# Patient Record
Sex: Male | Born: 1935
Health system: Southern US, Community
[De-identification: ages and names within clinical notes are randomized; demographics above are authoritative.]

## PROBLEM LIST (undated history)

## (undated) DIAGNOSIS — M4644 Discitis, unspecified, thoracic region: Secondary | ICD-10-CM

## (undated) DIAGNOSIS — M069 Rheumatoid arthritis, unspecified: Secondary | ICD-10-CM

## (undated) DIAGNOSIS — I350 Nonrheumatic aortic (valve) stenosis: Secondary | ICD-10-CM

## (undated) DIAGNOSIS — C439 Malignant melanoma of skin, unspecified: Secondary | ICD-10-CM

## (undated) DIAGNOSIS — I10 Essential (primary) hypertension: Secondary | ICD-10-CM

## (undated) HISTORY — PX: COLONOSCOPY: SHX174

## (undated) HISTORY — DX: Nonrheumatic aortic (valve) stenosis: I35.0

## (undated) HISTORY — PX: TONSILLECTOMY: SUR1361

## (undated) HISTORY — PX: OTHER SURGICAL HISTORY: SHX169

## (undated) HISTORY — PX: CATARACT EXTRACTION, BILATERAL: SHX1313

---

## 1998-03-21 ENCOUNTER — Other Ambulatory Visit: Admission: RE | Admit: 1998-03-21 | Discharge: 1998-03-21 | Payer: Self-pay | Admitting: Urology

## 1998-03-24 ENCOUNTER — Ambulatory Visit (HOSPITAL_COMMUNITY): Admission: RE | Admit: 1998-03-24 | Discharge: 1998-03-24 | Payer: Self-pay | Admitting: Geriatric Medicine

## 1999-11-27 ENCOUNTER — Ambulatory Visit (HOSPITAL_COMMUNITY): Admission: RE | Admit: 1999-11-27 | Discharge: 1999-11-28 | Payer: Self-pay | Admitting: Ophthalmology

## 2002-09-15 ENCOUNTER — Ambulatory Visit (HOSPITAL_COMMUNITY): Admission: RE | Admit: 2002-09-15 | Discharge: 2002-09-15 | Payer: Self-pay | Admitting: Gastroenterology

## 2005-08-06 ENCOUNTER — Encounter: Admission: RE | Admit: 2005-08-06 | Discharge: 2005-08-06 | Payer: Self-pay | Admitting: Geriatric Medicine

## 2011-04-24 ENCOUNTER — Emergency Department (INDEPENDENT_AMBULATORY_CARE_PROVIDER_SITE_OTHER): Payer: Medicare Other

## 2011-04-24 ENCOUNTER — Emergency Department (HOSPITAL_BASED_OUTPATIENT_CLINIC_OR_DEPARTMENT_OTHER)
Admission: EM | Admit: 2011-04-24 | Discharge: 2011-04-24 | Disposition: A | Discharge status: Home / Self Care | Payer: Medicare Other | Attending: Emergency Medicine | Admitting: Emergency Medicine

## 2011-04-24 DIAGNOSIS — R509 Fever, unspecified: Secondary | ICD-10-CM

## 2011-04-24 DIAGNOSIS — J159 Unspecified bacterial pneumonia: Secondary | ICD-10-CM | POA: Insufficient documentation

## 2011-04-24 DIAGNOSIS — Z8739 Personal history of other diseases of the musculoskeletal system and connective tissue: Secondary | ICD-10-CM | POA: Insufficient documentation

## 2011-04-24 DIAGNOSIS — Z79899 Other long term (current) drug therapy: Secondary | ICD-10-CM | POA: Insufficient documentation

## 2011-04-24 DIAGNOSIS — R05 Cough: Secondary | ICD-10-CM

## 2011-04-24 DIAGNOSIS — I10 Essential (primary) hypertension: Secondary | ICD-10-CM | POA: Insufficient documentation

## 2011-04-24 HISTORY — DX: Essential (primary) hypertension: I10

## 2011-04-24 HISTORY — DX: Malignant melanoma of skin, unspecified: C43.9

## 2011-04-24 HISTORY — DX: Rheumatoid arthritis, unspecified: M06.9

## 2011-04-24 MED ORDER — MOXIFLOXACIN HCL 400 MG PO TABS
400.0000 mg | ORAL_TABLET | Freq: Once | ORAL | Status: AC
  Administered 2011-04-24: 400 mg via ORAL
  Filled 2011-04-24: qty 1

## 2011-04-24 MED ORDER — ACETAMINOPHEN 325 MG PO TABS
650.0000 mg | ORAL_TABLET | Freq: Once | ORAL | Status: AC
  Administered 2011-04-24: 650 mg via ORAL
  Filled 2011-04-24: qty 2

## 2011-04-24 MED ORDER — MOXIFLOXACIN HCL 400 MG PO TABS: 400.0000 mg | ORAL_TABLET | Freq: Every day | ORAL | Status: AC

## 2011-04-24 NOTE — ED Provider Notes (Signed)
History     CSN: [id number] Arrival date & time: 04/24/2011  6:19 PM   First MD Initiated Contact with Patient 04/24/11 1904      Chief Complaint  Patient presents with  . URI    (Consider location/radiation/quality/duration/timing/severity/associated sxs/prior treatment) Patient is a 77 y.o. male presenting with cough.  Cough This is a new problem. The current episode started yesterday. The problem occurs constantly. The problem has not changed since onset.The cough is productive of sputum. The maximum temperature recorded prior to his arrival was 100 to 100.9 F. The fever has been present for less than 1 day. Associated symptoms include chills, sore throat and myalgias. Pertinent negatives include no chest pain, no sweats, no weight loss, no ear congestion, no ear pain, no headaches, no shortness of breath and no wheezing. Treatments tried: Throat spray. The treatment provided mild relief.   Patient with RA receive scheduled Remicade last week and has multiple sick contacts at home diagnosed with bronchitis. Patient started feeling poorly yesterday with cough and chest congestion, some sore throat and fever today. He denies any difficulty breathing, any rash, any abdominal pain or vomiting. He is followed by Dr. Cardell Peach for his rheumatoid and by Dr. Pete Glatter in [place]. Symptoms moderate in severity. No aggravating or alleviating factors.   Past Medical History  Diagnosis Date  . Rheumatoid arthritis   . Hypertension   . Melanoma     Past Surgical History  Procedure Date  . Tonsillectomy   . Hole in retina surgery     No family history on file.  History  Substance Use Topics  . Smoking status: Former Games developer  . Smokeless tobacco: Not on file  . Alcohol Use: Yes      Review of Systems  Constitutional: Positive for chills. Negative for fever and weight loss.  HENT: Positive for sore throat. Negative for ear pain, neck pain and neck stiffness.   Eyes: Negative for pain.   Respiratory: Positive for cough. Negative for shortness of breath and wheezing.   Cardiovascular: Negative for chest pain.  Gastrointestinal: Negative for abdominal pain.  Genitourinary: Negative for dysuria.  Musculoskeletal: Positive for myalgias. Negative for back pain.  Skin: Negative for rash.  Neurological: Negative for headaches.  All other systems reviewed and are negative.    Allergies  Other and Penicillins cross reactors  Home Medications   Current Outpatient Rx  Name Route Sig Dispense Refill  . AMLODIPINE BESYLATE 2.5 MG PO TABS Oral Take 2.5 mg by mouth daily.      Marland Kitchen CALCIUM CARBONATE-VITAMIN D 600-400 MG-UNIT PO TABS Oral Take 1 tablet by mouth daily.      . CYANOCOBALAMIN 500 MCG PO TABS Oral Take 500 mcg by mouth daily.      . INFLIXIMAB 100 MG IV SOLR Intravenous Inject into the vein every 8 (eight) weeks.      Marland Kitchen LISINOPRIL 10 MG PO TABS Oral Take 10 mg by mouth daily.      Marland Kitchen MELATONIN 3 MG PO TABS Oral Take 1 tablet by mouth at bedtime.      . METHOTREXATE (ANTI-RHEUMATIC) 2.5 MG PO TABS Oral Take 20 mg by mouth every 7 (seven) days. Take on Monday     . METOPROLOL SUCCINATE 50 MG PO TB24 Oral Take 50 mg by mouth daily.      Marland Kitchen ONE-DAILY MULTI VITAMINS PO TABS Oral Take 1 tablet by mouth daily.        BP 168/89  Pulse 78  Temp(Src) 98.2 F (36.8 C) (Oral)  Resp 20  Ht 5\' 6"  (1.676 m)  Wt 178 lb (80.74 kg)  BMI 28.73 kg/m2  SpO2 99%  Physical Exam  Constitutional: He is oriented to person, place, and time. He appears well-developed and well-nourished.  HENT:  Head: Normocephalic and atraumatic.  Eyes: Conjunctivae and EOM are normal. Pupils are equal, round, and reactive to light.  Neck: Trachea normal. Neck supple. No thyromegaly present.  Cardiovascular: Normal rate, regular rhythm, S1 normal, S2 normal and normal pulses.     No systolic murmur is present   No diastolic murmur is present  Pulses:      Radial pulses are 2+ on the right side, and  2+ on the left side.  Pulmonary/Chest: Effort normal and breath sounds normal. He has no wheezes. He has no rhonchi. He has no rales. He exhibits no tenderness.  Abdominal: Soft. Normal appearance and bowel sounds are normal. There is no tenderness. There is no CVA tenderness and negative Murphy's sign.  Musculoskeletal:       BLE:s Calves nontender, no cords or erythema, negative Homans sign  Neurological: He is alert and oriented to person, place, and time. He has normal strength. No cranial nerve deficit or sensory deficit. GCS eye subscore is 4. GCS verbal subscore is 5. GCS motor subscore is 6.  Skin: Skin is warm and dry. No rash noted. He is not diaphoretic.  Psychiatric: His speech is normal.       Cooperative and appropriate    ED Course  Procedures (including critical care time)  Labs Reviewed - No data to display Dg Chest 2 View  04/24/2011  *RADIOLOGY REPORT*  Clinical Data: 77 year old male with productive cough and fever.  CHEST - 2 VIEW  Comparison: None.  Findings: Lung volumes are within normal limits.  Cardiac size at the upper limits of normal. Other mediastinal contours are within normal limits.  Visualized tracheal air column is within normal limits.  No pneumothorax, pulmonary edema or pleural effusion.  No consolidation, but there is subtle asymmetric right lower lobe peribronchovascular opacity. Flowing osteophytes in the thoracic spine. No acute osseous abnormality identified.  IMPRESSION: Asymmetric right lower lobe peribronchovascular opacity suspicious for developing bronchopneumonia/pneumonia in this setting.  Original Report Authenticated By: Harley Hallmark, M.D.   Pulse ox 99% room air: Adequate  Diagnosis: Bronchopneumonia   MDM   Elderly male with cough, subjective fever and chest x-ray obtained and reviewed as above consistent with community-acquired pneumonia. Lung sounds are clear with no respiratory distress or wheezing. No tachycardia or hypoxia and  afebrile at this time. Patient is a reliable historian and appropriate for discharge home with outpatient treatment. He agrees to be evaluated immediately for any difficulty breathing or worsening condition. Plan recheck with primary care physician 2 days in clinic.        Sunnie Nielsen, MD 04/24/11 [phone]

## 2011-04-24 NOTE — ED Notes (Signed)
Prod cough, fever, chills x 4 days

## 2011-06-20 DIAGNOSIS — M069 Rheumatoid arthritis, unspecified: Secondary | ICD-10-CM | POA: Diagnosis not present

## 2011-06-28 DIAGNOSIS — J019 Acute sinusitis, unspecified: Secondary | ICD-10-CM | POA: Diagnosis not present

## 2011-08-14 DIAGNOSIS — J019 Acute sinusitis, unspecified: Secondary | ICD-10-CM | POA: Diagnosis not present

## 2011-08-14 DIAGNOSIS — J310 Chronic rhinitis: Secondary | ICD-10-CM | POA: Diagnosis not present

## 2011-08-15 DIAGNOSIS — M069 Rheumatoid arthritis, unspecified: Secondary | ICD-10-CM | POA: Diagnosis not present

## 2011-08-15 DIAGNOSIS — J019 Acute sinusitis, unspecified: Secondary | ICD-10-CM | POA: Diagnosis not present

## 2011-09-12 DIAGNOSIS — H35379 Puckering of macula, unspecified eye: Secondary | ICD-10-CM | POA: Diagnosis not present

## 2011-09-12 DIAGNOSIS — H52209 Unspecified astigmatism, unspecified eye: Secondary | ICD-10-CM | POA: Diagnosis not present

## 2011-09-12 DIAGNOSIS — Z961 Presence of intraocular lens: Secondary | ICD-10-CM | POA: Diagnosis not present

## 2011-09-12 DIAGNOSIS — H01009 Unspecified blepharitis unspecified eye, unspecified eyelid: Secondary | ICD-10-CM | POA: Diagnosis not present

## 2011-10-03 DIAGNOSIS — I10 Essential (primary) hypertension: Secondary | ICD-10-CM | POA: Diagnosis not present

## 2011-10-03 DIAGNOSIS — N401 Enlarged prostate with lower urinary tract symptoms: Secondary | ICD-10-CM | POA: Diagnosis not present

## 2011-10-03 DIAGNOSIS — E7800 Pure hypercholesterolemia, unspecified: Secondary | ICD-10-CM | POA: Diagnosis not present

## 2011-10-03 DIAGNOSIS — N529 Male erectile dysfunction, unspecified: Secondary | ICD-10-CM | POA: Diagnosis not present

## 2011-10-03 DIAGNOSIS — Z1331 Encounter for screening for depression: Secondary | ICD-10-CM | POA: Diagnosis not present

## 2011-10-03 DIAGNOSIS — Z0000 Encounter for general adult medical examination without abnormal findings: Secondary | ICD-10-CM | POA: Diagnosis not present

## 2011-10-03 DIAGNOSIS — Z79899 Other long term (current) drug therapy: Secondary | ICD-10-CM | POA: Diagnosis not present

## 2011-10-03 DIAGNOSIS — R972 Elevated prostate specific antigen [PSA]: Secondary | ICD-10-CM | POA: Diagnosis not present

## 2011-10-10 DIAGNOSIS — M069 Rheumatoid arthritis, unspecified: Secondary | ICD-10-CM | POA: Diagnosis not present

## 2011-10-25 DIAGNOSIS — Z85820 Personal history of malignant melanoma of skin: Secondary | ICD-10-CM | POA: Diagnosis not present

## 2011-10-25 DIAGNOSIS — L570 Actinic keratosis: Secondary | ICD-10-CM | POA: Diagnosis not present

## 2011-10-25 DIAGNOSIS — D239 Other benign neoplasm of skin, unspecified: Secondary | ICD-10-CM | POA: Diagnosis not present

## 2011-12-11 DIAGNOSIS — M069 Rheumatoid arthritis, unspecified: Secondary | ICD-10-CM | POA: Diagnosis not present

## 2012-02-07 DIAGNOSIS — M545 Low back pain: Secondary | ICD-10-CM | POA: Diagnosis not present

## 2012-02-07 DIAGNOSIS — M069 Rheumatoid arthritis, unspecified: Secondary | ICD-10-CM | POA: Diagnosis not present

## 2012-02-13 DIAGNOSIS — H35349 Macular cyst, hole, or pseudohole, unspecified eye: Secondary | ICD-10-CM | POA: Diagnosis not present

## 2012-02-13 DIAGNOSIS — H35379 Puckering of macula, unspecified eye: Secondary | ICD-10-CM | POA: Diagnosis not present

## 2012-02-13 DIAGNOSIS — H01009 Unspecified blepharitis unspecified eye, unspecified eyelid: Secondary | ICD-10-CM | POA: Diagnosis not present

## 2012-02-14 DIAGNOSIS — N401 Enlarged prostate with lower urinary tract symptoms: Secondary | ICD-10-CM | POA: Diagnosis not present

## 2012-02-14 DIAGNOSIS — N281 Cyst of kidney, acquired: Secondary | ICD-10-CM | POA: Diagnosis not present

## 2012-02-14 DIAGNOSIS — M545 Low back pain: Secondary | ICD-10-CM | POA: Diagnosis not present

## 2012-04-02 DIAGNOSIS — M069 Rheumatoid arthritis, unspecified: Secondary | ICD-10-CM | POA: Diagnosis not present

## 2012-04-09 DIAGNOSIS — I10 Essential (primary) hypertension: Secondary | ICD-10-CM | POA: Diagnosis not present

## 2012-04-09 DIAGNOSIS — R0602 Shortness of breath: Secondary | ICD-10-CM | POA: Diagnosis not present

## 2012-05-14 DIAGNOSIS — L719 Rosacea, unspecified: Secondary | ICD-10-CM | POA: Diagnosis not present

## 2012-05-14 DIAGNOSIS — Z85820 Personal history of malignant melanoma of skin: Secondary | ICD-10-CM | POA: Diagnosis not present

## 2012-05-14 DIAGNOSIS — D239 Other benign neoplasm of skin, unspecified: Secondary | ICD-10-CM | POA: Diagnosis not present

## 2012-05-14 DIAGNOSIS — L570 Actinic keratosis: Secondary | ICD-10-CM | POA: Diagnosis not present

## 2012-05-20 DIAGNOSIS — L570 Actinic keratosis: Secondary | ICD-10-CM | POA: Diagnosis not present

## 2012-05-29 DIAGNOSIS — M069 Rheumatoid arthritis, unspecified: Secondary | ICD-10-CM | POA: Diagnosis not present

## 2012-08-03 DIAGNOSIS — M069 Rheumatoid arthritis, unspecified: Secondary | ICD-10-CM | POA: Diagnosis not present

## 2012-09-03 DIAGNOSIS — R040 Epistaxis: Secondary | ICD-10-CM | POA: Diagnosis not present

## 2012-09-17 DIAGNOSIS — H52209 Unspecified astigmatism, unspecified eye: Secondary | ICD-10-CM | POA: Diagnosis not present

## 2012-09-17 DIAGNOSIS — H01009 Unspecified blepharitis unspecified eye, unspecified eyelid: Secondary | ICD-10-CM | POA: Diagnosis not present

## 2012-09-17 DIAGNOSIS — Z961 Presence of intraocular lens: Secondary | ICD-10-CM | POA: Diagnosis not present

## 2012-09-17 DIAGNOSIS — H35349 Macular cyst, hole, or pseudohole, unspecified eye: Secondary | ICD-10-CM | POA: Diagnosis not present

## 2012-10-01 DIAGNOSIS — M069 Rheumatoid arthritis, unspecified: Secondary | ICD-10-CM | POA: Diagnosis not present

## 2012-10-15 DIAGNOSIS — E7800 Pure hypercholesterolemia, unspecified: Secondary | ICD-10-CM | POA: Diagnosis not present

## 2012-10-15 DIAGNOSIS — I10 Essential (primary) hypertension: Secondary | ICD-10-CM | POA: Diagnosis not present

## 2012-10-15 DIAGNOSIS — Z79899 Other long term (current) drug therapy: Secondary | ICD-10-CM | POA: Diagnosis not present

## 2012-10-15 DIAGNOSIS — Z0000 Encounter for general adult medical examination without abnormal findings: Secondary | ICD-10-CM | POA: Diagnosis not present

## 2012-10-15 DIAGNOSIS — Z1331 Encounter for screening for depression: Secondary | ICD-10-CM | POA: Diagnosis not present

## 2012-11-19 DIAGNOSIS — L570 Actinic keratosis: Secondary | ICD-10-CM | POA: Diagnosis not present

## 2012-11-19 DIAGNOSIS — Z872 Personal history of diseases of the skin and subcutaneous tissue: Secondary | ICD-10-CM | POA: Diagnosis not present

## 2012-11-19 DIAGNOSIS — Z85820 Personal history of malignant melanoma of skin: Secondary | ICD-10-CM | POA: Diagnosis not present

## 2012-11-19 DIAGNOSIS — D239 Other benign neoplasm of skin, unspecified: Secondary | ICD-10-CM | POA: Diagnosis not present

## 2012-11-26 DIAGNOSIS — M069 Rheumatoid arthritis, unspecified: Secondary | ICD-10-CM | POA: Diagnosis not present

## 2013-02-04 DIAGNOSIS — M069 Rheumatoid arthritis, unspecified: Secondary | ICD-10-CM | POA: Diagnosis not present

## 2013-02-04 DIAGNOSIS — Z23 Encounter for immunization: Secondary | ICD-10-CM | POA: Diagnosis not present

## 2013-02-11 DIAGNOSIS — R7989 Other specified abnormal findings of blood chemistry: Secondary | ICD-10-CM | POA: Diagnosis not present

## 2013-03-04 DIAGNOSIS — H35379 Puckering of macula, unspecified eye: Secondary | ICD-10-CM | POA: Diagnosis not present

## 2013-03-04 DIAGNOSIS — H35349 Macular cyst, hole, or pseudohole, unspecified eye: Secondary | ICD-10-CM | POA: Diagnosis not present

## 2013-04-01 DIAGNOSIS — M069 Rheumatoid arthritis, unspecified: Secondary | ICD-10-CM | POA: Diagnosis not present

## 2013-04-15 DIAGNOSIS — I10 Essential (primary) hypertension: Secondary | ICD-10-CM | POA: Diagnosis not present

## 2013-04-15 DIAGNOSIS — R42 Dizziness and giddiness: Secondary | ICD-10-CM | POA: Diagnosis not present

## 2013-05-27 DIAGNOSIS — M069 Rheumatoid arthritis, unspecified: Secondary | ICD-10-CM | POA: Diagnosis not present

## 2013-06-24 DIAGNOSIS — Z85820 Personal history of malignant melanoma of skin: Secondary | ICD-10-CM | POA: Diagnosis not present

## 2013-06-24 DIAGNOSIS — Z872 Personal history of diseases of the skin and subcutaneous tissue: Secondary | ICD-10-CM | POA: Diagnosis not present

## 2013-06-24 DIAGNOSIS — D239 Other benign neoplasm of skin, unspecified: Secondary | ICD-10-CM | POA: Diagnosis not present

## 2013-06-24 DIAGNOSIS — L719 Rosacea, unspecified: Secondary | ICD-10-CM | POA: Diagnosis not present

## 2013-06-24 DIAGNOSIS — L570 Actinic keratosis: Secondary | ICD-10-CM | POA: Diagnosis not present

## 2013-07-22 DIAGNOSIS — M069 Rheumatoid arthritis, unspecified: Secondary | ICD-10-CM | POA: Diagnosis not present

## 2013-08-19 DIAGNOSIS — N529 Male erectile dysfunction, unspecified: Secondary | ICD-10-CM | POA: Diagnosis not present

## 2013-08-19 DIAGNOSIS — N401 Enlarged prostate with lower urinary tract symptoms: Secondary | ICD-10-CM | POA: Diagnosis not present

## 2013-08-19 DIAGNOSIS — N138 Other obstructive and reflux uropathy: Secondary | ICD-10-CM | POA: Diagnosis not present

## 2013-09-23 DIAGNOSIS — Z79899 Other long term (current) drug therapy: Secondary | ICD-10-CM | POA: Diagnosis not present

## 2013-09-23 DIAGNOSIS — M069 Rheumatoid arthritis, unspecified: Secondary | ICD-10-CM | POA: Diagnosis not present

## 2013-09-30 DIAGNOSIS — H01009 Unspecified blepharitis unspecified eye, unspecified eyelid: Secondary | ICD-10-CM | POA: Diagnosis not present

## 2013-09-30 DIAGNOSIS — Z961 Presence of intraocular lens: Secondary | ICD-10-CM | POA: Diagnosis not present

## 2013-09-30 DIAGNOSIS — H35349 Macular cyst, hole, or pseudohole, unspecified eye: Secondary | ICD-10-CM | POA: Diagnosis not present

## 2013-09-30 DIAGNOSIS — H02409 Unspecified ptosis of unspecified eyelid: Secondary | ICD-10-CM | POA: Diagnosis not present

## 2013-10-21 ENCOUNTER — Other Ambulatory Visit: Payer: Self-pay | Admitting: Geriatric Medicine

## 2013-10-21 DIAGNOSIS — Z23 Encounter for immunization: Secondary | ICD-10-CM | POA: Diagnosis not present

## 2013-10-21 DIAGNOSIS — D126 Benign neoplasm of colon, unspecified: Secondary | ICD-10-CM | POA: Diagnosis not present

## 2013-10-21 DIAGNOSIS — Z87891 Personal history of nicotine dependence: Secondary | ICD-10-CM

## 2013-10-21 DIAGNOSIS — E7800 Pure hypercholesterolemia, unspecified: Secondary | ICD-10-CM | POA: Diagnosis not present

## 2013-10-21 DIAGNOSIS — K429 Umbilical hernia without obstruction or gangrene: Secondary | ICD-10-CM | POA: Diagnosis not present

## 2013-10-21 DIAGNOSIS — I10 Essential (primary) hypertension: Secondary | ICD-10-CM | POA: Diagnosis not present

## 2013-10-21 DIAGNOSIS — Z1331 Encounter for screening for depression: Secondary | ICD-10-CM | POA: Diagnosis not present

## 2013-10-21 DIAGNOSIS — Z0000 Encounter for general adult medical examination without abnormal findings: Secondary | ICD-10-CM | POA: Diagnosis not present

## 2013-11-04 ENCOUNTER — Ambulatory Visit
Admission: RE | Admit: 2013-11-04 | Discharge: 2013-11-04 | Disposition: A | Discharge status: Home / Self Care | Payer: BLUE CROSS/BLUE SHIELD | Source: Ambulatory Visit | Attending: Geriatric Medicine | Admitting: Geriatric Medicine

## 2013-11-04 DIAGNOSIS — Z87891 Personal history of nicotine dependence: Secondary | ICD-10-CM

## 2013-11-04 DIAGNOSIS — I700 Atherosclerosis of aorta: Secondary | ICD-10-CM | POA: Diagnosis not present

## 2013-11-04 DIAGNOSIS — Z136 Encounter for screening for cardiovascular disorders: Secondary | ICD-10-CM | POA: Diagnosis not present

## 2013-11-18 DIAGNOSIS — Z79899 Other long term (current) drug therapy: Secondary | ICD-10-CM | POA: Diagnosis not present

## 2013-11-18 DIAGNOSIS — M069 Rheumatoid arthritis, unspecified: Secondary | ICD-10-CM | POA: Diagnosis not present

## 2013-11-26 DIAGNOSIS — R7402 Elevation of levels of lactic acid dehydrogenase (LDH): Secondary | ICD-10-CM | POA: Diagnosis not present

## 2013-12-29 ENCOUNTER — Other Ambulatory Visit: Payer: Self-pay | Admitting: Gastroenterology

## 2013-12-29 DIAGNOSIS — K5730 Diverticulosis of large intestine without perforation or abscess without bleeding: Secondary | ICD-10-CM | POA: Diagnosis not present

## 2013-12-29 DIAGNOSIS — D126 Benign neoplasm of colon, unspecified: Secondary | ICD-10-CM | POA: Diagnosis not present

## 2013-12-29 DIAGNOSIS — Z86010 Personal history of colonic polyps: Secondary | ICD-10-CM | POA: Diagnosis not present

## 2013-12-29 DIAGNOSIS — Z09 Encounter for follow-up examination after completed treatment for conditions other than malignant neoplasm: Secondary | ICD-10-CM | POA: Diagnosis not present

## 2014-02-04 DIAGNOSIS — L570 Actinic keratosis: Secondary | ICD-10-CM | POA: Diagnosis not present

## 2014-02-04 DIAGNOSIS — Z872 Personal history of diseases of the skin and subcutaneous tissue: Secondary | ICD-10-CM | POA: Diagnosis not present

## 2014-02-04 DIAGNOSIS — L719 Rosacea, unspecified: Secondary | ICD-10-CM | POA: Diagnosis not present

## 2014-02-04 DIAGNOSIS — Z85820 Personal history of malignant melanoma of skin: Secondary | ICD-10-CM | POA: Diagnosis not present

## 2014-03-10 DIAGNOSIS — H35373 Puckering of macula, bilateral: Secondary | ICD-10-CM | POA: Diagnosis not present

## 2014-03-10 DIAGNOSIS — H43813 Vitreous degeneration, bilateral: Secondary | ICD-10-CM | POA: Diagnosis not present

## 2014-03-10 DIAGNOSIS — H35343 Macular cyst, hole, or pseudohole, bilateral: Secondary | ICD-10-CM | POA: Diagnosis not present

## 2014-03-24 DIAGNOSIS — M0589 Other rheumatoid arthritis with rheumatoid factor of multiple sites: Secondary | ICD-10-CM | POA: Diagnosis not present

## 2014-03-24 DIAGNOSIS — Z79899 Other long term (current) drug therapy: Secondary | ICD-10-CM | POA: Diagnosis not present

## 2014-04-21 DIAGNOSIS — E780 Pure hypercholesterolemia: Secondary | ICD-10-CM | POA: Diagnosis not present

## 2014-04-21 DIAGNOSIS — I10 Essential (primary) hypertension: Secondary | ICD-10-CM | POA: Diagnosis not present

## 2014-05-19 DIAGNOSIS — Z79899 Other long term (current) drug therapy: Secondary | ICD-10-CM | POA: Diagnosis not present

## 2014-05-19 DIAGNOSIS — M0579 Rheumatoid arthritis with rheumatoid factor of multiple sites without organ or systems involvement: Secondary | ICD-10-CM | POA: Diagnosis not present

## 2014-07-14 DIAGNOSIS — Z79899 Other long term (current) drug therapy: Secondary | ICD-10-CM | POA: Diagnosis not present

## 2014-07-14 DIAGNOSIS — M0579 Rheumatoid arthritis with rheumatoid factor of multiple sites without organ or systems involvement: Secondary | ICD-10-CM | POA: Diagnosis not present

## 2014-08-04 DIAGNOSIS — H903 Sensorineural hearing loss, bilateral: Secondary | ICD-10-CM | POA: Diagnosis not present

## 2014-08-04 DIAGNOSIS — H905 Unspecified sensorineural hearing loss: Secondary | ICD-10-CM | POA: Diagnosis not present

## 2014-08-11 DIAGNOSIS — Z85820 Personal history of malignant melanoma of skin: Secondary | ICD-10-CM | POA: Diagnosis not present

## 2014-08-11 DIAGNOSIS — D229 Melanocytic nevi, unspecified: Secondary | ICD-10-CM | POA: Diagnosis not present

## 2014-08-11 DIAGNOSIS — L719 Rosacea, unspecified: Secondary | ICD-10-CM | POA: Diagnosis not present

## 2014-08-11 DIAGNOSIS — Z872 Personal history of diseases of the skin and subcutaneous tissue: Secondary | ICD-10-CM | POA: Diagnosis not present

## 2014-09-29 DIAGNOSIS — Z79899 Other long term (current) drug therapy: Secondary | ICD-10-CM | POA: Diagnosis not present

## 2014-09-29 DIAGNOSIS — M0579 Rheumatoid arthritis with rheumatoid factor of multiple sites without organ or systems involvement: Secondary | ICD-10-CM | POA: Diagnosis not present

## 2014-10-06 DIAGNOSIS — H01001 Unspecified blepharitis right upper eyelid: Secondary | ICD-10-CM | POA: Diagnosis not present

## 2014-10-06 DIAGNOSIS — H35373 Puckering of macula, bilateral: Secondary | ICD-10-CM | POA: Diagnosis not present

## 2014-10-06 DIAGNOSIS — Z961 Presence of intraocular lens: Secondary | ICD-10-CM | POA: Diagnosis not present

## 2014-10-06 DIAGNOSIS — H02403 Unspecified ptosis of bilateral eyelids: Secondary | ICD-10-CM | POA: Diagnosis not present

## 2014-10-07 DIAGNOSIS — N138 Other obstructive and reflux uropathy: Secondary | ICD-10-CM | POA: Diagnosis not present

## 2014-10-07 DIAGNOSIS — N401 Enlarged prostate with lower urinary tract symptoms: Secondary | ICD-10-CM | POA: Diagnosis not present

## 2014-10-07 DIAGNOSIS — N5201 Erectile dysfunction due to arterial insufficiency: Secondary | ICD-10-CM | POA: Diagnosis not present

## 2014-10-27 DIAGNOSIS — Z0000 Encounter for general adult medical examination without abnormal findings: Secondary | ICD-10-CM | POA: Diagnosis not present

## 2014-10-27 DIAGNOSIS — E780 Pure hypercholesterolemia: Secondary | ICD-10-CM | POA: Diagnosis not present

## 2014-10-27 DIAGNOSIS — Z1389 Encounter for screening for other disorder: Secondary | ICD-10-CM | POA: Diagnosis not present

## 2014-10-27 DIAGNOSIS — I700 Atherosclerosis of aorta: Secondary | ICD-10-CM | POA: Diagnosis not present

## 2014-10-27 DIAGNOSIS — I10 Essential (primary) hypertension: Secondary | ICD-10-CM | POA: Diagnosis not present

## 2014-10-27 DIAGNOSIS — M069 Rheumatoid arthritis, unspecified: Secondary | ICD-10-CM | POA: Diagnosis not present

## 2014-11-24 DIAGNOSIS — M0579 Rheumatoid arthritis with rheumatoid factor of multiple sites without organ or systems involvement: Secondary | ICD-10-CM | POA: Diagnosis not present

## 2014-11-24 DIAGNOSIS — Z79899 Other long term (current) drug therapy: Secondary | ICD-10-CM | POA: Diagnosis not present

## 2015-01-19 DIAGNOSIS — M0579 Rheumatoid arthritis with rheumatoid factor of multiple sites without organ or systems involvement: Secondary | ICD-10-CM | POA: Diagnosis not present

## 2015-01-19 DIAGNOSIS — Z79899 Other long term (current) drug therapy: Secondary | ICD-10-CM | POA: Diagnosis not present

## 2015-03-16 DIAGNOSIS — H35373 Puckering of macula, bilateral: Secondary | ICD-10-CM | POA: Diagnosis not present

## 2015-03-16 DIAGNOSIS — H35343 Macular cyst, hole, or pseudohole, bilateral: Secondary | ICD-10-CM | POA: Diagnosis not present

## 2015-03-16 DIAGNOSIS — H43813 Vitreous degeneration, bilateral: Secondary | ICD-10-CM | POA: Diagnosis not present

## 2015-03-16 DIAGNOSIS — M0579 Rheumatoid arthritis with rheumatoid factor of multiple sites without organ or systems involvement: Secondary | ICD-10-CM | POA: Diagnosis not present

## 2015-03-16 DIAGNOSIS — Z79899 Other long term (current) drug therapy: Secondary | ICD-10-CM | POA: Diagnosis not present

## 2015-03-16 DIAGNOSIS — D649 Anemia, unspecified: Secondary | ICD-10-CM | POA: Diagnosis not present

## 2015-03-30 DIAGNOSIS — M47812 Spondylosis without myelopathy or radiculopathy, cervical region: Secondary | ICD-10-CM | POA: Diagnosis not present

## 2015-03-30 DIAGNOSIS — Z79899 Other long term (current) drug therapy: Secondary | ICD-10-CM | POA: Diagnosis not present

## 2015-03-30 DIAGNOSIS — M069 Rheumatoid arthritis, unspecified: Secondary | ICD-10-CM | POA: Diagnosis not present

## 2015-03-30 DIAGNOSIS — M0579 Rheumatoid arthritis with rheumatoid factor of multiple sites without organ or systems involvement: Secondary | ICD-10-CM | POA: Diagnosis not present

## 2015-03-30 DIAGNOSIS — M5031 Other cervical disc degeneration,  high cervical region: Secondary | ICD-10-CM | POA: Diagnosis not present

## 2015-03-30 DIAGNOSIS — M47811 Spondylosis without myelopathy or radiculopathy, occipito-atlanto-axial region: Secondary | ICD-10-CM | POA: Diagnosis not present

## 2015-03-30 DIAGNOSIS — M542 Cervicalgia: Secondary | ICD-10-CM | POA: Diagnosis not present

## 2015-04-06 DIAGNOSIS — D229 Melanocytic nevi, unspecified: Secondary | ICD-10-CM | POA: Diagnosis not present

## 2015-04-06 DIAGNOSIS — L719 Rosacea, unspecified: Secondary | ICD-10-CM | POA: Diagnosis not present

## 2015-04-06 DIAGNOSIS — Z872 Personal history of diseases of the skin and subcutaneous tissue: Secondary | ICD-10-CM | POA: Diagnosis not present

## 2015-04-06 DIAGNOSIS — Z85820 Personal history of malignant melanoma of skin: Secondary | ICD-10-CM | POA: Diagnosis not present

## 2015-04-06 DIAGNOSIS — L570 Actinic keratosis: Secondary | ICD-10-CM | POA: Diagnosis not present

## 2015-04-17 DIAGNOSIS — H9201 Otalgia, right ear: Secondary | ICD-10-CM | POA: Diagnosis not present

## 2015-04-20 DIAGNOSIS — Z6827 Body mass index (BMI) 27.0-27.9, adult: Secondary | ICD-10-CM | POA: Diagnosis not present

## 2015-04-20 DIAGNOSIS — M47812 Spondylosis without myelopathy or radiculopathy, cervical region: Secondary | ICD-10-CM | POA: Diagnosis not present

## 2015-04-27 DIAGNOSIS — M5023 Other cervical disc displacement, cervicothoracic region: Secondary | ICD-10-CM | POA: Diagnosis not present

## 2015-04-27 DIAGNOSIS — M4802 Spinal stenosis, cervical region: Secondary | ICD-10-CM | POA: Diagnosis not present

## 2015-04-27 DIAGNOSIS — M47812 Spondylosis without myelopathy or radiculopathy, cervical region: Secondary | ICD-10-CM | POA: Diagnosis not present

## 2015-05-11 DIAGNOSIS — Z79899 Other long term (current) drug therapy: Secondary | ICD-10-CM | POA: Diagnosis not present

## 2015-05-11 DIAGNOSIS — M0579 Rheumatoid arthritis with rheumatoid factor of multiple sites without organ or systems involvement: Secondary | ICD-10-CM | POA: Diagnosis not present

## 2015-05-11 DIAGNOSIS — I10 Essential (primary) hypertension: Secondary | ICD-10-CM | POA: Diagnosis not present

## 2015-05-18 DIAGNOSIS — Z79899 Other long term (current) drug therapy: Secondary | ICD-10-CM | POA: Diagnosis not present

## 2015-05-18 DIAGNOSIS — M47812 Spondylosis without myelopathy or radiculopathy, cervical region: Secondary | ICD-10-CM | POA: Diagnosis not present

## 2015-05-18 DIAGNOSIS — M0579 Rheumatoid arthritis with rheumatoid factor of multiple sites without organ or systems involvement: Secondary | ICD-10-CM | POA: Diagnosis not present

## 2015-05-30 DIAGNOSIS — M50323 Other cervical disc degeneration at C6-C7 level: Secondary | ICD-10-CM | POA: Diagnosis not present

## 2015-05-30 DIAGNOSIS — M50321 Other cervical disc degeneration at C4-C5 level: Secondary | ICD-10-CM | POA: Diagnosis not present

## 2015-05-30 DIAGNOSIS — M9901 Segmental and somatic dysfunction of cervical region: Secondary | ICD-10-CM | POA: Diagnosis not present

## 2015-05-30 DIAGNOSIS — M50322 Other cervical disc degeneration at C5-C6 level: Secondary | ICD-10-CM | POA: Diagnosis not present

## 2015-05-30 DIAGNOSIS — M5031 Other cervical disc degeneration,  high cervical region: Secondary | ICD-10-CM | POA: Diagnosis not present

## 2015-05-30 DIAGNOSIS — M9902 Segmental and somatic dysfunction of thoracic region: Secondary | ICD-10-CM | POA: Diagnosis not present

## 2015-05-31 DIAGNOSIS — M5031 Other cervical disc degeneration,  high cervical region: Secondary | ICD-10-CM | POA: Diagnosis not present

## 2015-05-31 DIAGNOSIS — M50321 Other cervical disc degeneration at C4-C5 level: Secondary | ICD-10-CM | POA: Diagnosis not present

## 2015-05-31 DIAGNOSIS — M50323 Other cervical disc degeneration at C6-C7 level: Secondary | ICD-10-CM | POA: Diagnosis not present

## 2015-05-31 DIAGNOSIS — M50322 Other cervical disc degeneration at C5-C6 level: Secondary | ICD-10-CM | POA: Diagnosis not present

## 2015-05-31 DIAGNOSIS — M9902 Segmental and somatic dysfunction of thoracic region: Secondary | ICD-10-CM | POA: Diagnosis not present

## 2015-05-31 DIAGNOSIS — M9901 Segmental and somatic dysfunction of cervical region: Secondary | ICD-10-CM | POA: Diagnosis not present

## 2015-06-01 DIAGNOSIS — M50322 Other cervical disc degeneration at C5-C6 level: Secondary | ICD-10-CM | POA: Diagnosis not present

## 2015-06-01 DIAGNOSIS — M9901 Segmental and somatic dysfunction of cervical region: Secondary | ICD-10-CM | POA: Diagnosis not present

## 2015-06-01 DIAGNOSIS — M50321 Other cervical disc degeneration at C4-C5 level: Secondary | ICD-10-CM | POA: Diagnosis not present

## 2015-06-01 DIAGNOSIS — M9902 Segmental and somatic dysfunction of thoracic region: Secondary | ICD-10-CM | POA: Diagnosis not present

## 2015-06-01 DIAGNOSIS — M50323 Other cervical disc degeneration at C6-C7 level: Secondary | ICD-10-CM | POA: Diagnosis not present

## 2015-06-01 DIAGNOSIS — M5031 Other cervical disc degeneration,  high cervical region: Secondary | ICD-10-CM | POA: Diagnosis not present

## 2015-06-06 DIAGNOSIS — M9902 Segmental and somatic dysfunction of thoracic region: Secondary | ICD-10-CM | POA: Diagnosis not present

## 2015-06-06 DIAGNOSIS — M50322 Other cervical disc degeneration at C5-C6 level: Secondary | ICD-10-CM | POA: Diagnosis not present

## 2015-06-06 DIAGNOSIS — M5031 Other cervical disc degeneration,  high cervical region: Secondary | ICD-10-CM | POA: Diagnosis not present

## 2015-06-06 DIAGNOSIS — M50323 Other cervical disc degeneration at C6-C7 level: Secondary | ICD-10-CM | POA: Diagnosis not present

## 2015-06-06 DIAGNOSIS — M50321 Other cervical disc degeneration at C4-C5 level: Secondary | ICD-10-CM | POA: Diagnosis not present

## 2015-06-06 DIAGNOSIS — M9901 Segmental and somatic dysfunction of cervical region: Secondary | ICD-10-CM | POA: Diagnosis not present

## 2015-06-08 DIAGNOSIS — M50323 Other cervical disc degeneration at C6-C7 level: Secondary | ICD-10-CM | POA: Diagnosis not present

## 2015-06-08 DIAGNOSIS — M9902 Segmental and somatic dysfunction of thoracic region: Secondary | ICD-10-CM | POA: Diagnosis not present

## 2015-06-08 DIAGNOSIS — M50322 Other cervical disc degeneration at C5-C6 level: Secondary | ICD-10-CM | POA: Diagnosis not present

## 2015-06-08 DIAGNOSIS — M5031 Other cervical disc degeneration,  high cervical region: Secondary | ICD-10-CM | POA: Diagnosis not present

## 2015-06-08 DIAGNOSIS — M50321 Other cervical disc degeneration at C4-C5 level: Secondary | ICD-10-CM | POA: Diagnosis not present

## 2015-06-08 DIAGNOSIS — M9901 Segmental and somatic dysfunction of cervical region: Secondary | ICD-10-CM | POA: Diagnosis not present

## 2015-06-13 DIAGNOSIS — M50321 Other cervical disc degeneration at C4-C5 level: Secondary | ICD-10-CM | POA: Diagnosis not present

## 2015-06-13 DIAGNOSIS — M9902 Segmental and somatic dysfunction of thoracic region: Secondary | ICD-10-CM | POA: Diagnosis not present

## 2015-06-13 DIAGNOSIS — M9901 Segmental and somatic dysfunction of cervical region: Secondary | ICD-10-CM | POA: Diagnosis not present

## 2015-06-13 DIAGNOSIS — M50323 Other cervical disc degeneration at C6-C7 level: Secondary | ICD-10-CM | POA: Diagnosis not present

## 2015-06-13 DIAGNOSIS — M50322 Other cervical disc degeneration at C5-C6 level: Secondary | ICD-10-CM | POA: Diagnosis not present

## 2015-06-13 DIAGNOSIS — M5031 Other cervical disc degeneration,  high cervical region: Secondary | ICD-10-CM | POA: Diagnosis not present

## 2015-06-15 DIAGNOSIS — M50323 Other cervical disc degeneration at C6-C7 level: Secondary | ICD-10-CM | POA: Diagnosis not present

## 2015-06-15 DIAGNOSIS — M47812 Spondylosis without myelopathy or radiculopathy, cervical region: Secondary | ICD-10-CM | POA: Diagnosis not present

## 2015-06-15 DIAGNOSIS — M9902 Segmental and somatic dysfunction of thoracic region: Secondary | ICD-10-CM | POA: Diagnosis not present

## 2015-06-15 DIAGNOSIS — M5031 Other cervical disc degeneration,  high cervical region: Secondary | ICD-10-CM | POA: Diagnosis not present

## 2015-06-15 DIAGNOSIS — Z6827 Body mass index (BMI) 27.0-27.9, adult: Secondary | ICD-10-CM | POA: Diagnosis not present

## 2015-06-15 DIAGNOSIS — I10 Essential (primary) hypertension: Secondary | ICD-10-CM | POA: Diagnosis not present

## 2015-06-15 DIAGNOSIS — M50321 Other cervical disc degeneration at C4-C5 level: Secondary | ICD-10-CM | POA: Diagnosis not present

## 2015-06-15 DIAGNOSIS — M50322 Other cervical disc degeneration at C5-C6 level: Secondary | ICD-10-CM | POA: Diagnosis not present

## 2015-06-15 DIAGNOSIS — M9901 Segmental and somatic dysfunction of cervical region: Secondary | ICD-10-CM | POA: Diagnosis not present

## 2015-06-20 DIAGNOSIS — M50323 Other cervical disc degeneration at C6-C7 level: Secondary | ICD-10-CM | POA: Diagnosis not present

## 2015-06-20 DIAGNOSIS — M5031 Other cervical disc degeneration,  high cervical region: Secondary | ICD-10-CM | POA: Diagnosis not present

## 2015-06-20 DIAGNOSIS — M9901 Segmental and somatic dysfunction of cervical region: Secondary | ICD-10-CM | POA: Diagnosis not present

## 2015-06-20 DIAGNOSIS — M9902 Segmental and somatic dysfunction of thoracic region: Secondary | ICD-10-CM | POA: Diagnosis not present

## 2015-06-20 DIAGNOSIS — M50321 Other cervical disc degeneration at C4-C5 level: Secondary | ICD-10-CM | POA: Diagnosis not present

## 2015-06-20 DIAGNOSIS — M50322 Other cervical disc degeneration at C5-C6 level: Secondary | ICD-10-CM | POA: Diagnosis not present

## 2015-06-27 DIAGNOSIS — M9902 Segmental and somatic dysfunction of thoracic region: Secondary | ICD-10-CM | POA: Diagnosis not present

## 2015-06-27 DIAGNOSIS — M9901 Segmental and somatic dysfunction of cervical region: Secondary | ICD-10-CM | POA: Diagnosis not present

## 2015-06-27 DIAGNOSIS — M50322 Other cervical disc degeneration at C5-C6 level: Secondary | ICD-10-CM | POA: Diagnosis not present

## 2015-06-27 DIAGNOSIS — M5031 Other cervical disc degeneration,  high cervical region: Secondary | ICD-10-CM | POA: Diagnosis not present

## 2015-06-27 DIAGNOSIS — M50323 Other cervical disc degeneration at C6-C7 level: Secondary | ICD-10-CM | POA: Diagnosis not present

## 2015-06-27 DIAGNOSIS — M50321 Other cervical disc degeneration at C4-C5 level: Secondary | ICD-10-CM | POA: Diagnosis not present

## 2015-06-29 DIAGNOSIS — M50323 Other cervical disc degeneration at C6-C7 level: Secondary | ICD-10-CM | POA: Diagnosis not present

## 2015-06-29 DIAGNOSIS — M9901 Segmental and somatic dysfunction of cervical region: Secondary | ICD-10-CM | POA: Diagnosis not present

## 2015-06-29 DIAGNOSIS — M50322 Other cervical disc degeneration at C5-C6 level: Secondary | ICD-10-CM | POA: Diagnosis not present

## 2015-06-29 DIAGNOSIS — M5031 Other cervical disc degeneration,  high cervical region: Secondary | ICD-10-CM | POA: Diagnosis not present

## 2015-06-29 DIAGNOSIS — M50321 Other cervical disc degeneration at C4-C5 level: Secondary | ICD-10-CM | POA: Diagnosis not present

## 2015-06-29 DIAGNOSIS — M9902 Segmental and somatic dysfunction of thoracic region: Secondary | ICD-10-CM | POA: Diagnosis not present

## 2015-07-06 DIAGNOSIS — M50322 Other cervical disc degeneration at C5-C6 level: Secondary | ICD-10-CM | POA: Diagnosis not present

## 2015-07-06 DIAGNOSIS — M0579 Rheumatoid arthritis with rheumatoid factor of multiple sites without organ or systems involvement: Secondary | ICD-10-CM | POA: Diagnosis not present

## 2015-07-06 DIAGNOSIS — M50321 Other cervical disc degeneration at C4-C5 level: Secondary | ICD-10-CM | POA: Diagnosis not present

## 2015-07-06 DIAGNOSIS — M50323 Other cervical disc degeneration at C6-C7 level: Secondary | ICD-10-CM | POA: Diagnosis not present

## 2015-07-06 DIAGNOSIS — M5031 Other cervical disc degeneration,  high cervical region: Secondary | ICD-10-CM | POA: Diagnosis not present

## 2015-07-06 DIAGNOSIS — M9901 Segmental and somatic dysfunction of cervical region: Secondary | ICD-10-CM | POA: Diagnosis not present

## 2015-07-06 DIAGNOSIS — M9902 Segmental and somatic dysfunction of thoracic region: Secondary | ICD-10-CM | POA: Diagnosis not present

## 2015-07-13 DIAGNOSIS — M9902 Segmental and somatic dysfunction of thoracic region: Secondary | ICD-10-CM | POA: Diagnosis not present

## 2015-07-13 DIAGNOSIS — M50322 Other cervical disc degeneration at C5-C6 level: Secondary | ICD-10-CM | POA: Diagnosis not present

## 2015-07-13 DIAGNOSIS — M5031 Other cervical disc degeneration,  high cervical region: Secondary | ICD-10-CM | POA: Diagnosis not present

## 2015-07-13 DIAGNOSIS — M50321 Other cervical disc degeneration at C4-C5 level: Secondary | ICD-10-CM | POA: Diagnosis not present

## 2015-07-13 DIAGNOSIS — M50323 Other cervical disc degeneration at C6-C7 level: Secondary | ICD-10-CM | POA: Diagnosis not present

## 2015-07-13 DIAGNOSIS — M9901 Segmental and somatic dysfunction of cervical region: Secondary | ICD-10-CM | POA: Diagnosis not present

## 2015-07-20 DIAGNOSIS — M50323 Other cervical disc degeneration at C6-C7 level: Secondary | ICD-10-CM | POA: Diagnosis not present

## 2015-07-20 DIAGNOSIS — M5031 Other cervical disc degeneration,  high cervical region: Secondary | ICD-10-CM | POA: Diagnosis not present

## 2015-07-20 DIAGNOSIS — M50321 Other cervical disc degeneration at C4-C5 level: Secondary | ICD-10-CM | POA: Diagnosis not present

## 2015-07-20 DIAGNOSIS — M50322 Other cervical disc degeneration at C5-C6 level: Secondary | ICD-10-CM | POA: Diagnosis not present

## 2015-07-20 DIAGNOSIS — M9901 Segmental and somatic dysfunction of cervical region: Secondary | ICD-10-CM | POA: Diagnosis not present

## 2015-07-20 DIAGNOSIS — M9902 Segmental and somatic dysfunction of thoracic region: Secondary | ICD-10-CM | POA: Diagnosis not present

## 2015-08-03 DIAGNOSIS — M50321 Other cervical disc degeneration at C4-C5 level: Secondary | ICD-10-CM | POA: Diagnosis not present

## 2015-08-03 DIAGNOSIS — M50322 Other cervical disc degeneration at C5-C6 level: Secondary | ICD-10-CM | POA: Diagnosis not present

## 2015-08-03 DIAGNOSIS — M50323 Other cervical disc degeneration at C6-C7 level: Secondary | ICD-10-CM | POA: Diagnosis not present

## 2015-08-03 DIAGNOSIS — M5031 Other cervical disc degeneration,  high cervical region: Secondary | ICD-10-CM | POA: Diagnosis not present

## 2015-08-03 DIAGNOSIS — M9901 Segmental and somatic dysfunction of cervical region: Secondary | ICD-10-CM | POA: Diagnosis not present

## 2015-08-03 DIAGNOSIS — M9902 Segmental and somatic dysfunction of thoracic region: Secondary | ICD-10-CM | POA: Diagnosis not present

## 2015-08-10 DIAGNOSIS — M9901 Segmental and somatic dysfunction of cervical region: Secondary | ICD-10-CM | POA: Diagnosis not present

## 2015-08-10 DIAGNOSIS — M5031 Other cervical disc degeneration,  high cervical region: Secondary | ICD-10-CM | POA: Diagnosis not present

## 2015-08-10 DIAGNOSIS — M50323 Other cervical disc degeneration at C6-C7 level: Secondary | ICD-10-CM | POA: Diagnosis not present

## 2015-08-10 DIAGNOSIS — M50321 Other cervical disc degeneration at C4-C5 level: Secondary | ICD-10-CM | POA: Diagnosis not present

## 2015-08-10 DIAGNOSIS — M50322 Other cervical disc degeneration at C5-C6 level: Secondary | ICD-10-CM | POA: Diagnosis not present

## 2015-08-10 DIAGNOSIS — M9902 Segmental and somatic dysfunction of thoracic region: Secondary | ICD-10-CM | POA: Diagnosis not present

## 2015-08-21 DIAGNOSIS — M50323 Other cervical disc degeneration at C6-C7 level: Secondary | ICD-10-CM | POA: Diagnosis not present

## 2015-08-21 DIAGNOSIS — M50321 Other cervical disc degeneration at C4-C5 level: Secondary | ICD-10-CM | POA: Diagnosis not present

## 2015-08-21 DIAGNOSIS — M9902 Segmental and somatic dysfunction of thoracic region: Secondary | ICD-10-CM | POA: Diagnosis not present

## 2015-08-21 DIAGNOSIS — M5031 Other cervical disc degeneration,  high cervical region: Secondary | ICD-10-CM | POA: Diagnosis not present

## 2015-08-21 DIAGNOSIS — M9901 Segmental and somatic dysfunction of cervical region: Secondary | ICD-10-CM | POA: Diagnosis not present

## 2015-08-21 DIAGNOSIS — M50322 Other cervical disc degeneration at C5-C6 level: Secondary | ICD-10-CM | POA: Diagnosis not present

## 2015-08-31 DIAGNOSIS — R5383 Other fatigue: Secondary | ICD-10-CM | POA: Diagnosis not present

## 2015-08-31 DIAGNOSIS — M0579 Rheumatoid arthritis with rheumatoid factor of multiple sites without organ or systems involvement: Secondary | ICD-10-CM | POA: Diagnosis not present

## 2015-08-31 DIAGNOSIS — Z79899 Other long term (current) drug therapy: Secondary | ICD-10-CM | POA: Diagnosis not present

## 2015-09-04 DIAGNOSIS — M50322 Other cervical disc degeneration at C5-C6 level: Secondary | ICD-10-CM | POA: Diagnosis not present

## 2015-09-04 DIAGNOSIS — M5031 Other cervical disc degeneration,  high cervical region: Secondary | ICD-10-CM | POA: Diagnosis not present

## 2015-09-04 DIAGNOSIS — M9901 Segmental and somatic dysfunction of cervical region: Secondary | ICD-10-CM | POA: Diagnosis not present

## 2015-09-04 DIAGNOSIS — M50321 Other cervical disc degeneration at C4-C5 level: Secondary | ICD-10-CM | POA: Diagnosis not present

## 2015-09-04 DIAGNOSIS — M50323 Other cervical disc degeneration at C6-C7 level: Secondary | ICD-10-CM | POA: Diagnosis not present

## 2015-09-04 DIAGNOSIS — M9902 Segmental and somatic dysfunction of thoracic region: Secondary | ICD-10-CM | POA: Diagnosis not present

## 2015-09-25 DIAGNOSIS — M50321 Other cervical disc degeneration at C4-C5 level: Secondary | ICD-10-CM | POA: Diagnosis not present

## 2015-09-25 DIAGNOSIS — M9902 Segmental and somatic dysfunction of thoracic region: Secondary | ICD-10-CM | POA: Diagnosis not present

## 2015-09-25 DIAGNOSIS — M50322 Other cervical disc degeneration at C5-C6 level: Secondary | ICD-10-CM | POA: Diagnosis not present

## 2015-09-25 DIAGNOSIS — M5031 Other cervical disc degeneration,  high cervical region: Secondary | ICD-10-CM | POA: Diagnosis not present

## 2015-09-25 DIAGNOSIS — M9901 Segmental and somatic dysfunction of cervical region: Secondary | ICD-10-CM | POA: Diagnosis not present

## 2015-09-25 DIAGNOSIS — M50323 Other cervical disc degeneration at C6-C7 level: Secondary | ICD-10-CM | POA: Diagnosis not present

## 2015-10-12 DIAGNOSIS — L719 Rosacea, unspecified: Secondary | ICD-10-CM | POA: Diagnosis not present

## 2015-10-12 DIAGNOSIS — D229 Melanocytic nevi, unspecified: Secondary | ICD-10-CM | POA: Diagnosis not present

## 2015-10-12 DIAGNOSIS — Z85820 Personal history of malignant melanoma of skin: Secondary | ICD-10-CM | POA: Diagnosis not present

## 2015-10-12 DIAGNOSIS — Z872 Personal history of diseases of the skin and subcutaneous tissue: Secondary | ICD-10-CM | POA: Diagnosis not present

## 2015-10-12 DIAGNOSIS — H52203 Unspecified astigmatism, bilateral: Secondary | ICD-10-CM | POA: Diagnosis not present

## 2015-10-12 DIAGNOSIS — Z961 Presence of intraocular lens: Secondary | ICD-10-CM | POA: Diagnosis not present

## 2015-10-12 DIAGNOSIS — H43813 Vitreous degeneration, bilateral: Secondary | ICD-10-CM | POA: Diagnosis not present

## 2015-10-12 DIAGNOSIS — L821 Other seborrheic keratosis: Secondary | ICD-10-CM | POA: Diagnosis not present

## 2015-10-12 DIAGNOSIS — H35343 Macular cyst, hole, or pseudohole, bilateral: Secondary | ICD-10-CM | POA: Diagnosis not present

## 2015-10-12 DIAGNOSIS — L570 Actinic keratosis: Secondary | ICD-10-CM | POA: Diagnosis not present

## 2015-10-23 DIAGNOSIS — M5031 Other cervical disc degeneration,  high cervical region: Secondary | ICD-10-CM | POA: Diagnosis not present

## 2015-10-23 DIAGNOSIS — M9901 Segmental and somatic dysfunction of cervical region: Secondary | ICD-10-CM | POA: Diagnosis not present

## 2015-10-23 DIAGNOSIS — M9902 Segmental and somatic dysfunction of thoracic region: Secondary | ICD-10-CM | POA: Diagnosis not present

## 2015-10-23 DIAGNOSIS — M50322 Other cervical disc degeneration at C5-C6 level: Secondary | ICD-10-CM | POA: Diagnosis not present

## 2015-10-23 DIAGNOSIS — M50323 Other cervical disc degeneration at C6-C7 level: Secondary | ICD-10-CM | POA: Diagnosis not present

## 2015-10-23 DIAGNOSIS — M50321 Other cervical disc degeneration at C4-C5 level: Secondary | ICD-10-CM | POA: Diagnosis not present

## 2015-10-27 DIAGNOSIS — Z0000 Encounter for general adult medical examination without abnormal findings: Secondary | ICD-10-CM | POA: Diagnosis not present

## 2015-10-27 DIAGNOSIS — N138 Other obstructive and reflux uropathy: Secondary | ICD-10-CM | POA: Diagnosis not present

## 2015-10-27 DIAGNOSIS — N401 Enlarged prostate with lower urinary tract symptoms: Secondary | ICD-10-CM | POA: Diagnosis not present

## 2015-10-27 DIAGNOSIS — R351 Nocturia: Secondary | ICD-10-CM | POA: Diagnosis not present

## 2015-10-27 DIAGNOSIS — N5201 Erectile dysfunction due to arterial insufficiency: Secondary | ICD-10-CM | POA: Diagnosis not present

## 2015-10-30 DIAGNOSIS — M0579 Rheumatoid arthritis with rheumatoid factor of multiple sites without organ or systems involvement: Secondary | ICD-10-CM | POA: Diagnosis not present

## 2015-10-30 DIAGNOSIS — Z79899 Other long term (current) drug therapy: Secondary | ICD-10-CM | POA: Diagnosis not present

## 2015-11-23 DIAGNOSIS — Z79899 Other long term (current) drug therapy: Secondary | ICD-10-CM | POA: Diagnosis not present

## 2015-11-23 DIAGNOSIS — Z1389 Encounter for screening for other disorder: Secondary | ICD-10-CM | POA: Diagnosis not present

## 2015-11-23 DIAGNOSIS — E7800 Pure hypercholesterolemia, unspecified: Secondary | ICD-10-CM | POA: Diagnosis not present

## 2015-11-23 DIAGNOSIS — M069 Rheumatoid arthritis, unspecified: Secondary | ICD-10-CM | POA: Diagnosis not present

## 2015-11-23 DIAGNOSIS — M542 Cervicalgia: Secondary | ICD-10-CM | POA: Diagnosis not present

## 2015-11-23 DIAGNOSIS — I10 Essential (primary) hypertension: Secondary | ICD-10-CM | POA: Diagnosis not present

## 2015-11-23 DIAGNOSIS — I700 Atherosclerosis of aorta: Secondary | ICD-10-CM | POA: Diagnosis not present

## 2015-11-23 DIAGNOSIS — Z0000 Encounter for general adult medical examination without abnormal findings: Secondary | ICD-10-CM | POA: Diagnosis not present

## 2015-12-21 DIAGNOSIS — Z79899 Other long term (current) drug therapy: Secondary | ICD-10-CM | POA: Diagnosis not present

## 2015-12-21 DIAGNOSIS — M0579 Rheumatoid arthritis with rheumatoid factor of multiple sites without organ or systems involvement: Secondary | ICD-10-CM | POA: Diagnosis not present

## 2016-01-18 DIAGNOSIS — L239 Allergic contact dermatitis, unspecified cause: Secondary | ICD-10-CM | POA: Diagnosis not present

## 2016-01-18 DIAGNOSIS — L821 Other seborrheic keratosis: Secondary | ICD-10-CM | POA: Diagnosis not present

## 2016-01-28 ENCOUNTER — Emergency Department (HOSPITAL_BASED_OUTPATIENT_CLINIC_OR_DEPARTMENT_OTHER): Payer: Medicare Other

## 2016-01-28 ENCOUNTER — Encounter (HOSPITAL_BASED_OUTPATIENT_CLINIC_OR_DEPARTMENT_OTHER): Payer: Self-pay | Admitting: *Deleted

## 2016-01-28 ENCOUNTER — Inpatient Hospital Stay (HOSPITAL_BASED_OUTPATIENT_CLINIC_OR_DEPARTMENT_OTHER)
Admission: EM | Admit: 2016-01-28 | Discharge: 2016-02-07 | DRG: 871 | Disposition: A | Discharge status: Home Health | Payer: Medicare Other | Attending: Internal Medicine | Admitting: Internal Medicine

## 2016-01-28 DIAGNOSIS — G061 Intraspinal abscess and granuloma: Secondary | ICD-10-CM | POA: Diagnosis not present

## 2016-01-28 DIAGNOSIS — R1013 Epigastric pain: Secondary | ICD-10-CM | POA: Diagnosis not present

## 2016-01-28 DIAGNOSIS — R140 Abdominal distension (gaseous): Secondary | ICD-10-CM

## 2016-01-28 DIAGNOSIS — E278 Other specified disorders of adrenal gland: Secondary | ICD-10-CM | POA: Diagnosis not present

## 2016-01-28 DIAGNOSIS — R109 Unspecified abdominal pain: Secondary | ICD-10-CM | POA: Diagnosis not present

## 2016-01-28 DIAGNOSIS — M4644 Discitis, unspecified, thoracic region: Secondary | ICD-10-CM | POA: Diagnosis present

## 2016-01-28 DIAGNOSIS — I10 Essential (primary) hypertension: Secondary | ICD-10-CM | POA: Diagnosis present

## 2016-01-28 DIAGNOSIS — R509 Fever, unspecified: Secondary | ICD-10-CM

## 2016-01-28 DIAGNOSIS — D696 Thrombocytopenia, unspecified: Secondary | ICD-10-CM | POA: Diagnosis not present

## 2016-01-28 DIAGNOSIS — E876 Hypokalemia: Secondary | ICD-10-CM | POA: Diagnosis not present

## 2016-01-28 DIAGNOSIS — I70209 Unspecified atherosclerosis of native arteries of extremities, unspecified extremity: Secondary | ICD-10-CM | POA: Diagnosis present

## 2016-01-28 DIAGNOSIS — D649 Anemia, unspecified: Secondary | ICD-10-CM | POA: Diagnosis present

## 2016-01-28 DIAGNOSIS — R0602 Shortness of breath: Secondary | ICD-10-CM | POA: Diagnosis not present

## 2016-01-28 DIAGNOSIS — J90 Pleural effusion, not elsewhere classified: Secondary | ICD-10-CM | POA: Diagnosis not present

## 2016-01-28 DIAGNOSIS — D72829 Elevated white blood cell count, unspecified: Secondary | ICD-10-CM | POA: Diagnosis not present

## 2016-01-28 DIAGNOSIS — N400 Enlarged prostate without lower urinary tract symptoms: Secondary | ICD-10-CM | POA: Diagnosis present

## 2016-01-28 DIAGNOSIS — R1010 Upper abdominal pain, unspecified: Secondary | ICD-10-CM | POA: Diagnosis not present

## 2016-01-28 DIAGNOSIS — B9562 Methicillin resistant Staphylococcus aureus infection as the cause of diseases classified elsewhere: Secondary | ICD-10-CM | POA: Diagnosis present

## 2016-01-28 DIAGNOSIS — A4102 Sepsis due to Methicillin resistant Staphylococcus aureus: Principal | ICD-10-CM | POA: Diagnosis present

## 2016-01-28 DIAGNOSIS — K560 Paralytic ileus: Secondary | ICD-10-CM | POA: Diagnosis not present

## 2016-01-28 DIAGNOSIS — R935 Abnormal findings on diagnostic imaging of other abdominal regions, including retroperitoneum: Secondary | ICD-10-CM

## 2016-01-28 DIAGNOSIS — R7881 Bacteremia: Secondary | ICD-10-CM | POA: Diagnosis present

## 2016-01-28 DIAGNOSIS — R739 Hyperglycemia, unspecified: Secondary | ICD-10-CM | POA: Diagnosis present

## 2016-01-28 DIAGNOSIS — Z87891 Personal history of nicotine dependence: Secondary | ICD-10-CM

## 2016-01-28 DIAGNOSIS — M4624 Osteomyelitis of vertebra, thoracic region: Secondary | ICD-10-CM | POA: Diagnosis present

## 2016-01-28 DIAGNOSIS — Z0189 Encounter for other specified special examinations: Secondary | ICD-10-CM

## 2016-01-28 DIAGNOSIS — Z85820 Personal history of malignant melanoma of skin: Secondary | ICD-10-CM

## 2016-01-28 DIAGNOSIS — M479 Spondylosis, unspecified: Secondary | ICD-10-CM | POA: Diagnosis present

## 2016-01-28 DIAGNOSIS — D6489 Other specified anemias: Secondary | ICD-10-CM | POA: Diagnosis present

## 2016-01-28 DIAGNOSIS — Z79899 Other long term (current) drug therapy: Secondary | ICD-10-CM

## 2016-01-28 DIAGNOSIS — B957 Other staphylococcus as the cause of diseases classified elsewhere: Secondary | ICD-10-CM | POA: Diagnosis present

## 2016-01-28 DIAGNOSIS — K567 Ileus, unspecified: Secondary | ICD-10-CM

## 2016-01-28 DIAGNOSIS — Z91013 Allergy to seafood: Secondary | ICD-10-CM

## 2016-01-28 DIAGNOSIS — Z880 Allergy status to penicillin: Secondary | ICD-10-CM

## 2016-01-28 DIAGNOSIS — M0579 Rheumatoid arthritis with rheumatoid factor of multiple sites without organ or systems involvement: Secondary | ICD-10-CM | POA: Diagnosis present

## 2016-01-28 DIAGNOSIS — E871 Hypo-osmolality and hyponatremia: Secondary | ICD-10-CM | POA: Diagnosis present

## 2016-01-28 LAB — LIPASE, BLOOD: Lipase: 18 U/L (ref 11–51)

## 2016-01-28 LAB — COMPREHENSIVE METABOLIC PANEL
ALK PHOS: 73 U/L (ref 38–126)
ALT: 20 U/L (ref 17–63)
AST: 27 U/L (ref 15–41)
Albumin: 4.2 g/dL (ref 3.5–5.0)
Anion gap: 8 (ref 5–15)
BUN: 17 mg/dL (ref 6–20)
CALCIUM: 9.4 mg/dL (ref 8.9–10.3)
CHLORIDE: 99 mmol/L — AB (ref 101–111)
CO2: 26 mmol/L (ref 22–32)
CREATININE: 0.72 mg/dL (ref 0.61–1.24)
GFR calc Af Amer: >60 mL/min (ref >60)
GFR calc non Af Amer: >60 mL/min (ref >60)
GLUCOSE: 125 mg/dL — AB (ref 65–99)
Potassium: 3.7 mmol/L (ref 3.5–5.1)
SODIUM: 133 mmol/L — AB (ref 135–145)
Total Bilirubin: 0.9 mg/dL (ref 0.3–1.2)
Total Protein: 7.1 g/dL (ref 6.5–8.1)

## 2016-01-28 LAB — URINALYSIS, ROUTINE W REFLEX MICROSCOPIC
Bilirubin Urine: NEGATIVE
Glucose, UA: NEGATIVE mg/dL
KETONES UR: NEGATIVE mg/dL
LEUKOCYTES UA: NEGATIVE
NITRITE: NEGATIVE
PH: 7.5 (ref 5.0–8.0)
Protein, ur: 30 mg/dL — AB
SPECIFIC GRAVITY, URINE: 1.044 — AB (ref 1.005–1.030)

## 2016-01-28 LAB — CBC WITH DIFFERENTIAL/PLATELET
BASOS ABS: 0 10*3/uL (ref 0.0–0.1)
Basophils Relative: 0 %
EOS ABS: 0 10*3/uL (ref 0.0–0.7)
EOS PCT: 0 %
HCT: 39.8 % (ref 39.0–52.0)
HEMOGLOBIN: 14 g/dL (ref 13.0–17.0)
LYMPHS ABS: 1 10*3/uL (ref 0.7–4.0)
LYMPHS PCT: 8 %
MCH: 32.9 pg (ref 26.0–34.0)
MCHC: 35.2 g/dL (ref 30.0–36.0)
MCV: 93.4 fL (ref 78.0–100.0)
Monocytes Absolute: 1.3 10*3/uL — ABNORMAL HIGH (ref 0.1–1.0)
Monocytes Relative: 11 %
NEUTROS PCT: 81 %
Neutro Abs: 9.6 10*3/uL — ABNORMAL HIGH (ref 1.7–7.7)
PLATELETS: 160 10*3/uL (ref 150–400)
RBC: 4.26 MIL/uL (ref 4.22–5.81)
RDW: 13 % (ref 11.5–15.5)
WBC: 12 10*3/uL — AB (ref 4.0–10.5)

## 2016-01-28 LAB — URINE MICROSCOPIC-ADD ON: Squamous Epithelial / LPF: NONE SEEN

## 2016-01-28 LAB — TROPONIN I: Troponin I: <0.03 ng/mL (ref <0.03)

## 2016-01-28 LAB — I-STAT CG4 LACTIC ACID, ED: Lactic Acid, Venous: 0.98 mmol/L (ref 0.5–1.9)

## 2016-01-28 MED ORDER — METRONIDAZOLE 500 MG PO TABS
500.0000 mg | ORAL_TABLET | Freq: Once | ORAL | Status: AC
  Administered 2016-01-28: 500 mg via ORAL
  Filled 2016-01-28: qty 1

## 2016-01-28 MED ORDER — FENTANYL CITRATE (PF) 100 MCG/2ML IJ SOLN
25.0000 ug | Freq: Once | INTRAMUSCULAR | Status: AC
Start: 2016-01-28 — End: 2016-01-28
  Administered 2016-01-28: 25 ug via INTRAVENOUS
  Filled 2016-01-28: qty 2

## 2016-01-28 MED ORDER — FENTANYL CITRATE (PF) 100 MCG/2ML IJ SOLN
12.5000 ug | Freq: Once | INTRAMUSCULAR | Status: AC
  Administered 2016-01-28: 12.5 ug via INTRAVENOUS
  Filled 2016-01-28: qty 2

## 2016-01-28 MED ORDER — FENTANYL CITRATE (PF) 100 MCG/2ML IJ SOLN: 50.0000 ug | Freq: Once | INTRAMUSCULAR | Status: DC

## 2016-01-28 MED ORDER — CIPROFLOXACIN IN D5W 400 MG/200ML IV SOLN
400.0000 mg | Freq: Once | INTRAVENOUS | Status: AC
  Administered 2016-01-28: 400 mg via INTRAVENOUS
  Filled 2016-01-28: qty 200

## 2016-01-28 MED ORDER — IOPAMIDOL (ISOVUE-300) INJECTION 61%
100.0000 mL | Freq: Once | INTRAVENOUS | Status: AC | PRN
  Administered 2016-01-28: 100 mL via INTRAVENOUS

## 2016-01-28 NOTE — Progress Notes (Signed)
Accepted to med-surg bed at [facility] under obs status from [facility] (Dr. Audie Pinto).    Epigastric pain last night, then much worse today. Having normal BM's, no N/V. There is leukocytosis and fever. Vitals stable. Lactate normal. CT abd/pel neg for acute findings, but a concerning left adrenal mass noted. CXR clear, UA not c/w infxn. No rash. Started on empiric Cipro and Flagyl despite neg CT d/t the only localizing sxs being GI. Requiring Fentanyl to control pain, so admission was requested.

## 2016-01-28 NOTE — ED Notes (Signed)
Pt c/o general  upper abd pain that started last night. States pain was constant but was better this morning. States the pain started back around 10-12 and has been constant since then. Denies any n/v/d or sob. Denies any fevers. No other complaints other than the pain.

## 2016-01-28 NOTE — ED Provider Notes (Signed)
[facility] Provider Note   CSN: [id number] Arrival date & time: 01/28/16  1647 By signing my name below, I, Dyke Brackett, attest that this documentation has been prepared under the direction and in the presence of Leonard Schwartz, MD . Electronically Signed: Dyke Brackett, Scribe. 01/28/2016. 5:32 PM.   History   Chief Complaint Chief Complaint  Patient presents with  . Abdominal Pain    HPI Tanner Rice is a 82 y.o. male who presents to the Emergency Department complaining of worsening, aching, generalized upper abdominal pain that does not radiate onset last night. Pt states he applied a heating pad to his abdomen with relief of pain until this morning. Per pt, the pain is worse today. He states that pain is unchanged by breathing. Pt states he is having bowel movements, but his stool is hard. His last BM was yesterday and he has not taken any laxatives or stool softeners.  Pt has no hx of abdominal surgery. He denies any nausea or vomiting.   The history is provided by the patient. No language interpreter was used.   Past Medical History:  Diagnosis Date  . Hypertension   . Melanoma ([place])   . Rheumatoid arthritis(714.0)     Patient Active Problem List   Diagnosis Date Noted  . Fever 01/28/2016    Past Surgical History:  Procedure Laterality Date  . hole in retina surgery    . TONSILLECTOMY        Home Medications    Prior to Admission medications   Medication Sig Start Date End Date Taking? Authorizing Provider  Tamsulosin HCl (FLOMAX PO) Take by mouth.   Yes Historical Provider, MD  amLODipine (NORVASC) 2.5 MG tablet Take 2.5 mg by mouth daily.      Historical Provider, MD  Calcium Carbonate-Vitamin D (CALTRATE 600+D) 600-400 MG-UNIT per tablet Take 1 tablet by mouth daily.      Historical Provider, MD  cyanocobalamin 500 MCG tablet Take 500 mcg by mouth daily.      Historical Provider, MD  inFLIXimab (REMICADE) 100 MG injection Inject into the vein  every 8 (eight) weeks.      Historical Provider, MD  lisinopril (PRINIVIL,ZESTRIL) 10 MG tablet Take 10 mg by mouth daily.      Historical Provider, MD  Melatonin 3 MG TABS Take 1 tablet by mouth at bedtime.      Historical Provider, MD  methotrexate (RHEUMATREX) 2.5 MG tablet Take 20 mg by mouth every 7 (seven) days. Take on Monday     Historical Provider, MD  metoprolol (TOPROL-XL) 50 MG 24 hr tablet Take 50 mg by mouth daily.      Historical Provider, MD  Multiple Vitamin (MULTIVITAMIN) tablet Take 1 tablet by mouth daily.      Historical Provider, MD    Family History No family history on file.  Social History Social History  Substance Use Topics  . Smoking status: Former Research scientist (life sciences)  . Smokeless tobacco: Never Used  . Alcohol use Yes     Comment: glass of wine a day      Allergies   Other and Penicillins cross reactors   Review of Systems Review of Systems 10 Systems reviewed and are negative for acute change except as noted in the HPI.   Physical Exam Updated Vital Signs BP 132/68   Pulse 73   Temp 99.6 F (37.6 C) (Oral)   Resp 22   Ht 5\' 6"  (1.676 m)   Wt 162 lb (73.5  kg)   SpO2 95%   BMI 26.15 kg/m   Physical Exam Physical Exam  Nursing note and vitals reviewed. Constitutional: He is oriented to person, place, and time. He appears well-developed and well-nourished. No distress.  HENT:  Head: Normocephalic and atraumatic.  Eyes: Pupils are equal, round, and reactive to light.  Neck: Normal range of motion.  Cardiovascular: Normal rate and intact distal pulses.  No significant murmurs or gallops.   Pulmonary/Chest: No respiratory distress.  Abdominal: Normal appearance. He exhibits no distension.  Mild epigastric tenderness to palpation.  Active bowel sounds present in all quadrants.  No rebound or guarding tenderness.   Musculoskeletal: Normal range of motion.  Neurological: He is alert and oriented to person, place, and time. No cranial nerve deficit.  Skin:  Skin is warm and dry. No rash noted.    ED Treatments / Results  DIAGNOSTIC STUDIES:  Oxygen Saturation is 100% on RA, normal by my interpretation.    COORDINATION OF CARE:  5:28 PM Will order DG abdomen, troponin, lactic acid, lipase, CMP, and CBC. Discussed treatment plan with pt at bedside and pt agreed to plan.   Labs (all labs ordered are listed, but only abnormal results are displayed) Labs Reviewed  CBC WITH DIFFERENTIAL/PLATELET - Abnormal; Notable for the following:       Result Value   WBC 12.0 (*)    Neutro Abs 9.6 (*)    Monocytes Absolute 1.3 (*)    All other components within normal limits  COMPREHENSIVE METABOLIC PANEL - Abnormal; Notable for the following:    Sodium 133 (*)    Chloride 99 (*)    Glucose, Bld 125 (*)    All other components within normal limits  URINALYSIS, ROUTINE W REFLEX MICROSCOPIC (NOT AT [facility]) - Abnormal; Notable for the following:    Specific Gravity, Urine 1.044 (*)    Hgb urine dipstick TRACE (*)    Protein, ur 30 (*)    All other components within normal limits  URINE MICROSCOPIC-ADD ON - Abnormal; Notable for the following:    Bacteria, UA RARE (*)    All other components within normal limits  LIPASE, BLOOD  TROPONIN I  LACTIC ACID, PLASMA  I-STAT CG4 LACTIC ACID, ED    EKG  EKG Interpretation  Date/Time:  Sunday January 28 2016 17:14:03 EDT Ventricular Rate:  101 PR Interval:  228 QRS Duration: 86 QT Interval:  332 QTC Calculation: 430 R Axis:   -31 Text Interpretation:  Sinus tachycardia with 1st degree A-V block Left axis deviation Abnormal ECG No previous tracing Confirmed by Harly Pipkins  MD, Eulalah Rupert ([id number]) on 01/28/2016 5:21:26 PM       Radiology Ct Abdomen Pelvis W Contrast  Result Date: 01/28/2016 CLINICAL DATA:  Aching upper abdominal pain EXAM: CT ABDOMEN AND PELVIS WITH CONTRAST TECHNIQUE: Multidetector CT imaging of the abdomen and pelvis was performed using the standard protocol following bolus administration of  intravenous contrast. CONTRAST:  15mL ISOVUE-300 IOPAMIDOL (ISOVUE-300) INJECTION 61% COMPARISON:  None. FINDINGS: Lower chest and abdominal wall: Small fatty umbilical hernia. Small fatty inguinal hernias. Nonspecific subpleural reticulation at the bases without honeycombing. Hepatobiliary: No focal liver abnormality.No evidence of biliary obstruction or stone. Pancreas: Unremarkable. Spleen: Unremarkable. Adrenals/Urinary Tract: 22 mm left adrenal nodule with nonspecific appearance on this postcontrast only exam. No hydronephrosis or stone. 29 mm left lower pole cortical and hilar cyst. Smaller right renal cyst. Unremarkable bladder. Stomach/Bowel: No obstruction. Colonic diverticulosis. Appendix not well seen; no pericecal inflammation. Reproductive:Marked  prostate enlargement, deforming the bladder base, with nonspecific heterogeneity. Vascular/Lymphatic: No acute vascular abnormality. Aortic and branch vessel atherosclerotic calcification. No mass or adenopathy. Other: No ascites or pneumoperitoneum. Musculoskeletal: Advanced lumbar facet arthropathy with mild L3-4 and L4-5 listhesis. Spondylosis. No acute or aggressive process. IMPRESSION: 1. No acute finding. 2. **An incidental finding of potential clinical significance has been found. 2.2 cm left adrenal mass with nonspecific imaging characteristics. If no outside comparison to document stability, adrenal protocol CT is recommended in this patient with reported history of melanoma.** 3.  Aortic Atherosclerosis (ICD10-170.0) 4. Prominent prostate enlargement Electronically Signed   By: Monte Fantasia M.D.   On: 01/28/2016 20:08   Dg Abdomen Acute W/chest  Result Date: 01/28/2016 CLINICAL DATA:  Epigastric pain for several hours EXAM: DG ABDOMEN ACUTE W/ 1V CHEST COMPARISON:  None. FINDINGS: Cardiac shadow is within normal limits. The lungs are well aerated bilaterally. No focal infiltrate or sizable effusion is seen. No free air is noted. Scattered  large and small bowel gas is seen. Degenerative changes of the lumbar spine are noted. IMPRESSION: No acute abnormality noted. Electronically Signed   By: Inez Catalina M.D.   On: 01/28/2016 18:13    Procedures Procedures (including critical care time)  Medications Ordered in ED Medications  iopamidol (ISOVUE-300) 61 % injection 100 mL (100 mLs Intravenous Contrast Given 01/28/16 1943)  ciprofloxacin (CIPRO) IVPB 400 mg (0 mg Intravenous Stopped 01/28/16 2251)  metroNIDAZOLE (FLAGYL) tablet 500 mg (500 mg Oral Given 01/28/16 2131)  fentaNYL (SUBLIMAZE) injection 12.5 mcg (12.5 mcg Intravenous Given 01/28/16 2220)     Initial Impression / Assessment and Plan / ED Course  I have reviewed the triage vital signs and the nursing notes.  Pertinent labs & imaging results that were available during my care of the patient were reviewed by me and considered in my medical decision making (see chart for details).  Clinical Course  Patient began developing a fever and chills while here.  Origin is uncertain.  Will admit for observation and treat as possible early diverticulitis.    Final Clinical Impressions(s) / ED Diagnoses   Final diagnoses:  Epigastric pain  Abnormal CT of the abdomen  Leukocytosis  Fever, unspecified fever cause  I personally performed the services described in this documentation, which was scribed in my presence. The recorded information has been reviewed and considered.   New Prescriptions New Prescriptions   No medications on file     Leonard Schwartz, MD 01/28/16 2253

## 2016-01-28 NOTE — ED Notes (Signed)
Patient assisted to side of the bed to use urinal.  Tolerated this well.

## 2016-01-28 NOTE — ED Notes (Signed)
Patient returned from radiology

## 2016-01-28 NOTE — ED Triage Notes (Signed)
Patient states he developed upper abdominal pain across his entire upper abdomen this morning.  Describes the pain as sore.

## 2016-01-29 ENCOUNTER — Encounter (HOSPITAL_COMMUNITY): Payer: Self-pay | Admitting: Family Medicine

## 2016-01-29 DIAGNOSIS — I70203 Unspecified atherosclerosis of native arteries of extremities, bilateral legs: Secondary | ICD-10-CM | POA: Diagnosis not present

## 2016-01-29 DIAGNOSIS — Z452 Encounter for adjustment and management of vascular access device: Secondary | ICD-10-CM | POA: Diagnosis not present

## 2016-01-29 DIAGNOSIS — M47816 Spondylosis without myelopathy or radiculopathy, lumbar region: Secondary | ICD-10-CM | POA: Diagnosis not present

## 2016-01-29 DIAGNOSIS — M0579 Rheumatoid arthritis with rheumatoid factor of multiple sites without organ or systems involvement: Secondary | ICD-10-CM | POA: Diagnosis present

## 2016-01-29 DIAGNOSIS — R109 Unspecified abdominal pain: Secondary | ICD-10-CM | POA: Diagnosis not present

## 2016-01-29 DIAGNOSIS — R1013 Epigastric pain: Secondary | ICD-10-CM | POA: Diagnosis not present

## 2016-01-29 DIAGNOSIS — Z4682 Encounter for fitting and adjustment of non-vascular catheter: Secondary | ICD-10-CM | POA: Diagnosis not present

## 2016-01-29 DIAGNOSIS — K567 Ileus, unspecified: Secondary | ICD-10-CM | POA: Diagnosis not present

## 2016-01-29 DIAGNOSIS — Z79899 Other long term (current) drug therapy: Secondary | ICD-10-CM | POA: Diagnosis not present

## 2016-01-29 DIAGNOSIS — Z91013 Allergy to seafood: Secondary | ICD-10-CM | POA: Diagnosis not present

## 2016-01-29 DIAGNOSIS — B9562 Methicillin resistant Staphylococcus aureus infection as the cause of diseases classified elsewhere: Secondary | ICD-10-CM | POA: Diagnosis not present

## 2016-01-29 DIAGNOSIS — Z880 Allergy status to penicillin: Secondary | ICD-10-CM | POA: Diagnosis not present

## 2016-01-29 DIAGNOSIS — M4644 Discitis, unspecified, thoracic region: Secondary | ICD-10-CM | POA: Diagnosis not present

## 2016-01-29 DIAGNOSIS — M4624 Osteomyelitis of vertebra, thoracic region: Secondary | ICD-10-CM | POA: Diagnosis not present

## 2016-01-29 DIAGNOSIS — R1010 Upper abdominal pain, unspecified: Secondary | ICD-10-CM | POA: Diagnosis not present

## 2016-01-29 DIAGNOSIS — A4102 Sepsis due to Methicillin resistant Staphylococcus aureus: Secondary | ICD-10-CM | POA: Diagnosis not present

## 2016-01-29 DIAGNOSIS — R509 Fever, unspecified: Secondary | ICD-10-CM | POA: Diagnosis not present

## 2016-01-29 DIAGNOSIS — R739 Hyperglycemia, unspecified: Secondary | ICD-10-CM | POA: Diagnosis present

## 2016-01-29 DIAGNOSIS — R7881 Bacteremia: Secondary | ICD-10-CM | POA: Diagnosis not present

## 2016-01-29 DIAGNOSIS — E278 Other specified disorders of adrenal gland: Secondary | ICD-10-CM | POA: Diagnosis present

## 2016-01-29 DIAGNOSIS — J90 Pleural effusion, not elsewhere classified: Secondary | ICD-10-CM | POA: Diagnosis not present

## 2016-01-29 DIAGNOSIS — Z87891 Personal history of nicotine dependence: Secondary | ICD-10-CM | POA: Diagnosis not present

## 2016-01-29 DIAGNOSIS — M47814 Spondylosis without myelopathy or radiculopathy, thoracic region: Secondary | ICD-10-CM | POA: Diagnosis not present

## 2016-01-29 DIAGNOSIS — Z85820 Personal history of malignant melanoma of skin: Secondary | ICD-10-CM | POA: Diagnosis not present

## 2016-01-29 DIAGNOSIS — B957 Other staphylococcus as the cause of diseases classified elsewhere: Secondary | ICD-10-CM | POA: Diagnosis present

## 2016-01-29 DIAGNOSIS — R918 Other nonspecific abnormal finding of lung field: Secondary | ICD-10-CM | POA: Diagnosis not present

## 2016-01-29 DIAGNOSIS — R079 Chest pain, unspecified: Secondary | ICD-10-CM | POA: Diagnosis not present

## 2016-01-29 DIAGNOSIS — M869 Osteomyelitis, unspecified: Secondary | ICD-10-CM | POA: Diagnosis not present

## 2016-01-29 DIAGNOSIS — N400 Enlarged prostate without lower urinary tract symptoms: Secondary | ICD-10-CM | POA: Diagnosis present

## 2016-01-29 DIAGNOSIS — I70209 Unspecified atherosclerosis of native arteries of extremities, unspecified extremity: Secondary | ICD-10-CM | POA: Diagnosis present

## 2016-01-29 DIAGNOSIS — G061 Intraspinal abscess and granuloma: Secondary | ICD-10-CM | POA: Diagnosis not present

## 2016-01-29 DIAGNOSIS — R140 Abdominal distension (gaseous): Secondary | ICD-10-CM | POA: Diagnosis not present

## 2016-01-29 DIAGNOSIS — M069 Rheumatoid arthritis, unspecified: Secondary | ICD-10-CM | POA: Diagnosis not present

## 2016-01-29 DIAGNOSIS — I10 Essential (primary) hypertension: Secondary | ICD-10-CM | POA: Diagnosis not present

## 2016-01-29 DIAGNOSIS — K560 Paralytic ileus: Secondary | ICD-10-CM | POA: Diagnosis present

## 2016-01-29 DIAGNOSIS — D696 Thrombocytopenia, unspecified: Secondary | ICD-10-CM | POA: Diagnosis present

## 2016-01-29 DIAGNOSIS — E876 Hypokalemia: Secondary | ICD-10-CM | POA: Diagnosis not present

## 2016-01-29 DIAGNOSIS — E871 Hypo-osmolality and hyponatremia: Secondary | ICD-10-CM | POA: Diagnosis not present

## 2016-01-29 DIAGNOSIS — I340 Nonrheumatic mitral (valve) insufficiency: Secondary | ICD-10-CM | POA: Diagnosis not present

## 2016-01-29 DIAGNOSIS — A4902 Methicillin resistant Staphylococcus aureus infection, unspecified site: Secondary | ICD-10-CM | POA: Diagnosis not present

## 2016-01-29 DIAGNOSIS — D649 Anemia, unspecified: Secondary | ICD-10-CM | POA: Diagnosis not present

## 2016-01-29 DIAGNOSIS — D6489 Other specified anemias: Secondary | ICD-10-CM | POA: Diagnosis present

## 2016-01-29 DIAGNOSIS — M479 Spondylosis, unspecified: Secondary | ICD-10-CM | POA: Diagnosis present

## 2016-01-29 LAB — BLOOD CULTURE ID PANEL (REFLEXED)
Acinetobacter baumannii: NOT DETECTED
CANDIDA ALBICANS: NOT DETECTED
CANDIDA GLABRATA: NOT DETECTED
CANDIDA KRUSEI: NOT DETECTED
CANDIDA PARAPSILOSIS: NOT DETECTED
CANDIDA TROPICALIS: NOT DETECTED
Carbapenem resistance: NOT DETECTED
ENTEROBACTER CLOACAE COMPLEX: NOT DETECTED
ENTEROCOCCUS SPECIES: NOT DETECTED
ESCHERICHIA COLI: NOT DETECTED
Enterobacteriaceae species: NOT DETECTED
Haemophilus influenzae: NOT DETECTED
KLEBSIELLA OXYTOCA: NOT DETECTED
KLEBSIELLA PNEUMONIAE: NOT DETECTED
Listeria monocytogenes: NOT DETECTED
Methicillin resistance: DETECTED — AB
Neisseria meningitidis: NOT DETECTED
PROTEUS SPECIES: NOT DETECTED
PSEUDOMONAS AERUGINOSA: NOT DETECTED
STREPTOCOCCUS AGALACTIAE: NOT DETECTED
STREPTOCOCCUS PNEUMONIAE: NOT DETECTED
STREPTOCOCCUS PYOGENES: NOT DETECTED
Serratia marcescens: NOT DETECTED
Staphylococcus aureus (BCID): DETECTED — AB
Staphylococcus species: DETECTED — AB
Streptococcus species: NOT DETECTED
VANCOMYCIN RESISTANCE: NOT DETECTED

## 2016-01-29 LAB — CBC
HCT: 38 % — ABNORMAL LOW (ref 39.0–52.0)
Hemoglobin: 12.7 g/dL — ABNORMAL LOW (ref 13.0–17.0)
MCH: 31.8 pg (ref 26.0–34.0)
MCHC: 33.4 g/dL (ref 30.0–36.0)
MCV: 95 fL (ref 78.0–100.0)
PLATELETS: 141 10*3/uL — AB (ref 150–400)
RBC: 4 MIL/uL — AB (ref 4.22–5.81)
RDW: 13.2 % (ref 11.5–15.5)
WBC: 16.9 10*3/uL — AB (ref 4.0–10.5)

## 2016-01-29 LAB — COMPREHENSIVE METABOLIC PANEL
ALK PHOS: 57 U/L (ref 38–126)
ALT: 17 U/L (ref 17–63)
AST: 20 U/L (ref 15–41)
Albumin: 3.3 g/dL — ABNORMAL LOW (ref 3.5–5.0)
Anion gap: 7 (ref 5–15)
BILIRUBIN TOTAL: 1.3 mg/dL — AB (ref 0.3–1.2)
BUN: 13 mg/dL (ref 6–20)
CALCIUM: 8.9 mg/dL (ref 8.9–10.3)
CO2: 26 mmol/L (ref 22–32)
CREATININE: 0.84 mg/dL (ref 0.61–1.24)
Chloride: 98 mmol/L — ABNORMAL LOW (ref 101–111)
Glucose, Bld: 133 mg/dL — ABNORMAL HIGH (ref 65–99)
Potassium: 3.6 mmol/L (ref 3.5–5.1)
Sodium: 131 mmol/L — ABNORMAL LOW (ref 135–145)
TOTAL PROTEIN: 5.8 g/dL — AB (ref 6.5–8.1)

## 2016-01-29 MED ORDER — SENNOSIDES-DOCUSATE SODIUM 8.6-50 MG PO TABS: 1.0000 | ORAL_TABLET | Freq: Every evening | ORAL | Status: DC | PRN

## 2016-01-29 MED ORDER — HYDROMORPHONE HCL 1 MG/ML IJ SOLN
1.0000 mg | INTRAMUSCULAR | Status: DC | PRN
  Administered 2016-01-29 – 2016-02-01 (×17): 1 mg via INTRAVENOUS
  Filled 2016-01-29 (×17): qty 1

## 2016-01-29 MED ORDER — SODIUM CHLORIDE 0.9 % IV SOLN: INTRAVENOUS | Status: DC

## 2016-01-29 MED ORDER — ONDANSETRON HCL 4 MG/2ML IJ SOLN: 4.0000 mg | Freq: Three times a day (TID) | INTRAMUSCULAR | Status: DC | PRN

## 2016-01-29 MED ORDER — VANCOMYCIN HCL IN DEXTROSE 1-5 GM/200ML-% IV SOLN
1000.0000 mg | Freq: Two times a day (BID) | INTRAVENOUS | Status: DC
  Administered 2016-01-29 – 2016-01-31 (×4): 1000 mg via INTRAVENOUS
  Filled 2016-01-29 (×7): qty 200

## 2016-01-29 MED ORDER — OXYCODONE HCL 5 MG PO TABS
5.0000 mg | ORAL_TABLET | ORAL | Status: DC | PRN
  Administered 2016-01-29: 5 mg via ORAL
  Filled 2016-01-29: qty 1

## 2016-01-29 MED ORDER — OXYCODONE HCL 5 MG PO TABS
10.0000 mg | ORAL_TABLET | ORAL | Status: DC | PRN
  Administered 2016-01-29 – 2016-02-04 (×12): 10 mg via ORAL
  Filled 2016-01-29 (×13): qty 2

## 2016-01-29 MED ORDER — ACETAMINOPHEN 325 MG PO TABS
650.0000 mg | ORAL_TABLET | Freq: Four times a day (QID) | ORAL | Status: DC | PRN
  Administered 2016-01-29 – 2016-01-30 (×4): 650 mg via ORAL
  Filled 2016-01-29 (×3): qty 2

## 2016-01-29 MED ORDER — LEVOFLOXACIN IN D5W 750 MG/150ML IV SOLN
750.0000 mg | INTRAVENOUS | Status: DC
  Administered 2016-01-29 – 2016-01-30 (×2): 750 mg via INTRAVENOUS
  Filled 2016-01-29 (×2): qty 150

## 2016-01-29 MED ORDER — METRONIDAZOLE IN NACL 5-0.79 MG/ML-% IV SOLN
500.0000 mg | Freq: Three times a day (TID) | INTRAVENOUS | Status: DC
  Administered 2016-01-29 – 2016-01-30 (×4): 500 mg via INTRAVENOUS
  Filled 2016-01-29 (×6): qty 100

## 2016-01-29 MED ORDER — AMLODIPINE BESYLATE 2.5 MG PO TABS
2.5000 mg | ORAL_TABLET | Freq: Every day | ORAL | Status: DC
  Administered 2016-01-29 – 2016-01-30 (×2): 2.5 mg via ORAL
  Filled 2016-01-29 (×2): qty 1

## 2016-01-29 MED ORDER — ONDANSETRON HCL 4 MG/2ML IJ SOLN
4.0000 mg | Freq: Four times a day (QID) | INTRAMUSCULAR | Status: DC | PRN
  Administered 2016-02-01: 4 mg via INTRAVENOUS
  Filled 2016-01-29: qty 2

## 2016-01-29 MED ORDER — METHOTREXATE (ANTI-RHEUMATIC) 2.5 MG PO TABS: 20.0000 mg | ORAL_TABLET | ORAL | Status: DC

## 2016-01-29 MED ORDER — ACETAMINOPHEN 650 MG RE SUPP: 650.0000 mg | Freq: Four times a day (QID) | RECTAL | Status: DC | PRN

## 2016-01-29 MED ORDER — METHOTREXATE 2.5 MG PO TABS
20.0000 mg | ORAL_TABLET | ORAL | Status: DC
  Administered 2016-01-29: 20 mg via ORAL
  Filled 2016-01-29: qty 8

## 2016-01-29 MED ORDER — TAMSULOSIN HCL 0.4 MG PO CAPS
0.4000 mg | ORAL_CAPSULE | Freq: Every day | ORAL | Status: DC
  Administered 2016-01-29 – 2016-02-07 (×10): 0.4 mg via ORAL
  Filled 2016-01-29 (×10): qty 1

## 2016-01-29 MED ORDER — SODIUM CHLORIDE 0.9 % IV SOLN
INTRAVENOUS | Status: DC
  Administered 2016-01-29 – 2016-01-31 (×6): via INTRAVENOUS
  Administered 2016-01-31: 100 mL/h via INTRAVENOUS
  Administered 2016-02-02: 10:00:00 via INTRAVENOUS

## 2016-01-29 MED ORDER — METOPROLOL SUCCINATE ER 50 MG PO TB24
50.0000 mg | ORAL_TABLET | Freq: Every day | ORAL | Status: DC
  Administered 2016-01-29 – 2016-01-31 (×3): 50 mg via ORAL
  Filled 2016-01-29 (×3): qty 1

## 2016-01-29 MED ORDER — BISACODYL 10 MG RE SUPP
10.0000 mg | Freq: Every day | RECTAL | Status: DC | PRN
  Administered 2016-01-30: 10 mg via RECTAL
  Filled 2016-01-29: qty 1

## 2016-01-29 MED ORDER — ENOXAPARIN SODIUM 40 MG/0.4ML ~~LOC~~ SOLN
40.0000 mg | SUBCUTANEOUS | Status: DC
  Administered 2016-01-29 – 2016-02-07 (×10): 40 mg via SUBCUTANEOUS
  Filled 2016-01-29 (×10): qty 0.4

## 2016-01-29 MED ORDER — ONDANSETRON HCL 4 MG PO TABS: 4.0000 mg | ORAL_TABLET | Freq: Four times a day (QID) | ORAL | Status: DC | PRN

## 2016-01-29 NOTE — Progress Notes (Signed)
Pharmacy Antibiotic Note  Tanner Rice is a 82 y.o. male admitted on 01/28/2016 with abdominal pain, and started on levaquin and flagyl. Now 2/2 blood culture are positive for GPC in clusters, BCID - MRSA, Pharmacy has been consulted for vancomycin dosing. crcl ~ 60-65 ml/min.  Plan: Vancomycin 1g IV Q 12 hrs Monitor renal function and f/u culture results Consider d/c levaquin and flagyl  Contact precaution    Height: 5\' 6"  (167.6 cm) Weight: 166 lb 3.2 oz (75.4 kg) IBW/kg (Calculated) : 63.8  Temp (24hrs), Avg:99.7 F (37.6 C), Min:98.9 F (37.2 C), Max:100.9 F (38.3 C)   Recent Labs Lab 01/28/16 1734 01/28/16 1746 01/29/16 0405  WBC 12.0*  --  16.9*  CREATININE 0.72  --  0.84  LATICACIDVEN  --  0.98  --     Estimated Creatinine Clearance: 63.3 mL/min (by C-G formula based on SCr of 0.84 mg/dL).    Allergies  Allergen Reactions  . Other Diarrhea, Nausea And Vomiting and Other (See Comments)    Seafood - muscles   . Penicillins Cross Reactors Hives    Antimicrobials this admission: cipro 8/20 x 1 Flagyl 8/20 >>  Levaquin 8/21 >> Vancomycin  8/21 >>   Dose adjustments this admission:   Microbiology results: 8/21 BCx x 2 : 2/2 GPC in clusters         BCID - MRSA   Thank you for allowing pharmacy to be a part of this patient's care.  Maryanna Shape, PharmD, BCPS  Clinical Pharmacist  Pager: [phone]    01/29/2016 9:05 PM

## 2016-01-29 NOTE — H&P (Signed)
History and Physical  Patient Name: Tanner Rice     [id number]    DOB: 1933-10-29    DOA: 01/28/2016 PCP: Mathews Argyle, MD   Patient coming from: Home --> [facility]  Chief Complaint: Abdominal pain  HPI: Tanner Rice is a 82 y.o. male with a past medical history significant for RA on Remicade and methotrexate, HTN and prior melanoma who presents with fever and abdominal pain.  The patient was in his usual state of health until Saturday night when he developed some epigastric and bilateral rib pain. This didn't improve with a heating pad but went away overnight but returned around midday today, was intense, constant, epigastric and across the top of the abdomen, sharp, worse with taking deep breath or coughing, so he came to the ER.  The patient reported no fever prior to coming to the ER, no cough, sputum, URI symptoms, no dysuria, urinary frequency, diarrhea, vomiting.  ED course: -Febrile to 100.9 F, heart rate 90s, respirations 22, hypertensive, oxygen saturation normal on room air -Na 133, K 3.7, Cr 0.7 (baseline), WBC 12 K, Hgb 14, lipase normal, lactic acid normal, troponin negative -Urinalysis but no pyuria or hematuria -Chest x-ray clear, flat radiograph of the abdomen unremarkable -Follow-up CT of the abdomen and pelvis with contrast showed no diverticulitis, pancreatitis, bowel obstruction, perforation, abscess, or constipation -Incidentally, it did show a 2.2 cm left adrenal mass for which dedicated adrenal CT is recommended in follow-up           ROS: Review of Systems  Constitutional: Positive for chills and fever.  HENT: Negative for congestion and sore throat.   Respiratory: Negative for cough, hemoptysis, sputum production, shortness of breath and wheezing.   Cardiovascular: Negative for chest pain.  Gastrointestinal: Positive for abdominal pain and constipation. Negative for diarrhea, nausea and vomiting.  Genitourinary: Negative for dysuria, flank  pain, frequency, hematuria and urgency.  Musculoskeletal: Negative for back pain, joint pain, myalgias and neck pain.  Skin: Negative for rash.  All other systems reviewed and are negative.       Past Medical History:  Diagnosis Date  . Hypertension   . Melanoma ([place])   . Rheumatoid arthritis(714.0)     Past Surgical History:  Procedure Laterality Date  . hole in retina surgery    . TONSILLECTOMY      Social History: Patient lives With his wife.  The patient walks unassisted. He works in the [employer]. He is not a smoker.    Allergies  Allergen Reactions  . Other Diarrhea and Nausea And Vomiting    Seafood - muscles   . Penicillins Cross Reactors Hives    Family history: family history includes Osteoarthritis in his brother; Prostate cancer in his brother and father.  Prior to Admission medications   Medication Sig Start Date End Date Taking? Authorizing Provider  Tamsulosin HCl (FLOMAX PO) Take by mouth.   Yes Historical Provider, MD  amLODipine (NORVASC) 2.5 MG tablet Take 2.5 mg by mouth daily.      Historical Provider, MD  Calcium Carbonate-Vitamin D (CALTRATE 600+D) 600-400 MG-UNIT per tablet Take 1 tablet by mouth daily.      Historical Provider, MD  cyanocobalamin 500 MCG tablet Take 500 mcg by mouth daily.      Historical Provider, MD  inFLIXimab (REMICADE) 100 MG injection Inject into the vein every 8 (eight) weeks.      Historical Provider, MD  lisinopril (PRINIVIL,ZESTRIL) 10 MG tablet Take 10 mg by  mouth daily.      Historical Provider, MD  Melatonin 3 MG TABS Take 1 tablet by mouth at bedtime.      Historical Provider, MD  methotrexate (RHEUMATREX) 2.5 MG tablet Take 20 mg by mouth every 7 (seven) days. Take on Monday     Historical Provider, MD  metoprolol (TOPROL-XL) 50 MG 24 hr tablet Take 50 mg by mouth daily.      Historical Provider, MD  Multiple Vitamin (MULTIVITAMIN) tablet Take 1 tablet by mouth daily.      Historical Provider, MD        Physical Exam: BP (!) 149/82 (BP Location: Left Arm)   Pulse 69   Temp (!) 100.9 F (38.3 C) (Oral)   Resp 17   Ht 5\' 6"  (1.676 m)   Wt 75.4 kg (166 lb 3.2 oz)   SpO2 96%   BMI 26.83 kg/m  General appearance: Well-developed, thin adult male, alert and in moderate distress from pain.   Eyes: Anicteric, conjunctiva pink, lids and lashes normal.     ENT: No nasal deformity, discharge, or epistaxis.  OP moist without lesions.   Skin: Hot and moist.  No jaundice.  No suspicious rashes or lesions. Cardiac: RRR, nl S1-S2, no murmurs appreciated.  Capillary refill is brisk.  JVP normal.  No LE edema.  Radial and DP pulses bounding and symmetric. Respiratory: Normal respiratory rate and rhythm.  No wheezes. Rales at both bases. GI: Abdomen with marked guarding.  TTP across upper abdomen, no rebound, no peritoneal signs. No ascites, distension.   MSK: No deformities or effusions.  No clubbing/cyanosis. Neuro: Cranial nerves grossly intact.  Sensorium intact and responding to questions, attention normal.  Speech is fluent.  Moves all extremities equally and with normal coordination.    Psych: Affect normal.  Judgment and insight appear normal.       Labs on Admission:  I have personally reviewed following labs and imaging studies: CBC:  Recent Labs Lab 01/28/16 1734  WBC 12.0*  NEUTROABS 9.6*  HGB 14.0  HCT 39.8  MCV 93.4  PLT 0000000   Basic Metabolic Panel:  Recent Labs Lab 01/28/16 1734  NA 133*  K 3.7  CL 99*  CO2 26  GLUCOSE 125*  BUN 17  CREATININE 0.72  CALCIUM 9.4   GFR: Estimated Creatinine Clearance: 66.5 mL/min (by C-G formula based on SCr of 0.8 mg/dL).  Liver Function Tests:  Recent Labs Lab 01/28/16 1734  AST 27  ALT 20  ALKPHOS 73  BILITOT 0.9  PROT 7.1  ALBUMIN 4.2    Recent Labs Lab 01/28/16 1734  LIPASE 18   Cardiac Enzymes:  Recent Labs Lab 01/28/16 1734  TROPONINI <0.03   Sepsis Labs: Lactic acid 0.98       Radiological Exams on Admission: Personally reviewed: Ct Abdomen Pelvis W Contrast  Result Date: 01/28/2016 CLINICAL DATA:  Aching upper abdominal pain EXAM: CT ABDOMEN AND PELVIS WITH CONTRAST TECHNIQUE: Multidetector CT imaging of the abdomen and pelvis was performed using the standard protocol following bolus administration of intravenous contrast. CONTRAST:  141mL ISOVUE-300 IOPAMIDOL (ISOVUE-300) INJECTION 61% COMPARISON:  None. FINDINGS: Lower chest and abdominal wall: Small fatty umbilical hernia. Small fatty inguinal hernias. Nonspecific subpleural reticulation at the bases without honeycombing. Hepatobiliary: No focal liver abnormality.No evidence of biliary obstruction or stone. Pancreas: Unremarkable. Spleen: Unremarkable. Adrenals/Urinary Tract: 22 mm left adrenal nodule with nonspecific appearance on this postcontrast only exam. No hydronephrosis or stone. 29 mm left lower pole cortical  and hilar cyst. Smaller right renal cyst. Unremarkable bladder. Stomach/Bowel: No obstruction. Colonic diverticulosis. Appendix not well seen; no pericecal inflammation. Reproductive:Marked prostate enlargement, deforming the bladder base, with nonspecific heterogeneity. Vascular/Lymphatic: No acute vascular abnormality. Aortic and branch vessel atherosclerotic calcification. No mass or adenopathy. Other: No ascites or pneumoperitoneum. Musculoskeletal: Advanced lumbar facet arthropathy with mild L3-4 and L4-5 listhesis. Spondylosis. No acute or aggressive process. IMPRESSION: 1. No acute finding. 2. **An incidental finding of potential clinical significance has been found. 2.2 cm left adrenal mass with nonspecific imaging characteristics. If no outside comparison to document stability, adrenal protocol CT is recommended in this patient with reported history of melanoma.** 3.  Aortic Atherosclerosis (ICD10-170.0) 4. Prominent prostate enlargement Electronically Signed   By: Monte Fantasia M.D.   On: 01/28/2016  20:08   Dg Abdomen Acute W/chest  Result Date: 01/28/2016 CLINICAL DATA:  Epigastric pain for several hours EXAM: DG ABDOMEN ACUTE W/ 1V CHEST COMPARISON:  None. FINDINGS: Cardiac shadow is within normal limits. The lungs are well aerated bilaterally. No focal infiltrate or sizable effusion is seen. No free air is noted. Scattered large and small bowel gas is seen. Degenerative changes of the lumbar spine are noted. IMPRESSION: No acute abnormality noted. Electronically Signed   By: Inez Catalina M.D.   On: 01/28/2016 18:13    EKG: Independently reviewed. Rate 101, QTc 430, no ST or T wave changes.  Sinus.    Assessment/Plan 1. Abdominal pain and fever, presumed diverticulitis:  CT shows no diverticulitis, although the clinical syndrome of abdominal pain, fever, and chills is most suggestive of this.  Pancreatitis is ruled out.  CT of the lower lungs shows a nonspecific reticular pattern, that is doubted to be pneumonia.    There are no peritoneal signs on my exam.    Regarding other potential causes of fever, he has no URI symptoms or sick contacts and UA is negative.  Unfortunately blood cultures were not obtained.   -Serial abdominal exams -Levaquin and flagyl -Clears -IVF -Oxycodone or hydromorphone for pain, ondanstron for nause -Repeat CMP tomorrow -Obtain blood cultures   2. Hyponatremia:  Mild.   -Check urine studies -Repeat metabolic panel  3. HTN:  -Continue home amlodipine and metoprolol -Hold lisinopril for now  4. RA:  -Continue methotrexate, which is due Monday -On Remicade  5. Left adrenal mass:  -Adrenal protocol CT either before discharge or as an outpatient       DVT prophylaxis: Lovenox  Code Status: FULL  Family Communication: None present  Disposition Plan: Anticipate IV fluids and empiric antibiotics.  Treat pain.  When able to take PO and pain better controlled, home with oral Levaquin and flagyl for presumed diverticulitis. Consults called:  None Admission status: OBS, med surg    Medical decision making: Patient seen at 2:00 AM on 01/29/2016. What exists of the patient's chart and outside records in [facility] were reviewed in depth.  Clinical condition: stable.        Edwin Dada [facility] Pager [phone]

## 2016-01-29 NOTE — ED Notes (Signed)
Attempted to call pts son. No answer 

## 2016-01-29 NOTE — Care Management Obs Status (Signed)
Interior NOTIFICATION   Patient Details  Name: Tanner Rice MRN: [id number] Date of Birth: [birth year]   Medicare Observation Status Notification Given:  Yes    Marilu Favre, RN 01/29/2016, 9:29 AM

## 2016-01-29 NOTE — Progress Notes (Signed)
Tanner Rice is a 82 y.o. male with a past medical history significant for RA on Remicade and methotrexate, HTN and prior melanoma who presents with fever and abdominal pain. CT abd shows incidental finding of 2.2 cm adrenal mass an diverticulosis.  He is empirically being treated for mild diverticulitis.  He was started on IV antibiotics , IV fluids and pain control.  He was admitted earlier this am and please see Dr danford's note in detail .  UA is negative.  Blood cultures are negative.  Afebrile , mild leukocytosis.   Continue to monitor.   Hosie Poisson, MD [phone]

## 2016-01-29 NOTE — Progress Notes (Signed)
PHARMACY - PHYSICIAN COMMUNICATION CRITICAL VALUE ALERT - BLOOD CULTURE IDENTIFICATION (BCID)  Results for orders placed or performed during the hospital encounter of 01/28/16  Blood Culture ID Panel (Reflexed) (Collected: 01/29/2016  4:02 AM)  Result Value Ref Range   Enterococcus species NOT DETECTED NOT DETECTED   Vancomycin resistance NOT DETECTED NOT DETECTED   Listeria monocytogenes NOT DETECTED NOT DETECTED   Staphylococcus species DETECTED (A) NOT DETECTED   Staphylococcus aureus DETECTED (A) NOT DETECTED   Methicillin resistance DETECTED (A) NOT DETECTED   Streptococcus species NOT DETECTED NOT DETECTED   Streptococcus agalactiae NOT DETECTED NOT DETECTED   Streptococcus pneumoniae NOT DETECTED NOT DETECTED   Streptococcus pyogenes NOT DETECTED NOT DETECTED   Acinetobacter baumannii NOT DETECTED NOT DETECTED   Enterobacteriaceae species NOT DETECTED NOT DETECTED   Enterobacter cloacae complex NOT DETECTED NOT DETECTED   Escherichia coli NOT DETECTED NOT DETECTED   Klebsiella oxytoca NOT DETECTED NOT DETECTED   Klebsiella pneumoniae NOT DETECTED NOT DETECTED   Proteus species NOT DETECTED NOT DETECTED   Serratia marcescens NOT DETECTED NOT DETECTED   Carbapenem resistance NOT DETECTED NOT DETECTED   Haemophilus influenzae NOT DETECTED NOT DETECTED   Neisseria meningitidis NOT DETECTED NOT DETECTED   Pseudomonas aeruginosa NOT DETECTED NOT DETECTED   Candida albicans NOT DETECTED NOT DETECTED   Candida glabrata NOT DETECTED NOT DETECTED   Candida krusei NOT DETECTED NOT DETECTED   Candida parapsilosis NOT DETECTED NOT DETECTED   Candida tropicalis NOT DETECTED NOT DETECTED    Name of physician (or Provider) Contacted: Text paged Chaney Malling, NP and recommended to add vancomycin.  Changes to prescribed antibiotics required: Add vancomycin per Pharmacy, day shift MD will consider d/c levaquin and flagyl if abdominal infection is ruled out.  Manley Mason 01/29/2016   8:38 PM

## 2016-01-29 NOTE — ED Notes (Signed)
Pt updated, pending [facility] arrival, pt belongings bagged and labeled, verbalizes that he left dentures and watch at the house, pt is wearing glasses (shirt, pants, hat, crocs bagged).

## 2016-01-30 ENCOUNTER — Inpatient Hospital Stay (HOSPITAL_COMMUNITY): Payer: Medicare Other

## 2016-01-30 DIAGNOSIS — M4644 Discitis, unspecified, thoracic region: Secondary | ICD-10-CM | POA: Diagnosis present

## 2016-01-30 DIAGNOSIS — M0579 Rheumatoid arthritis with rheumatoid factor of multiple sites without organ or systems involvement: Secondary | ICD-10-CM

## 2016-01-30 DIAGNOSIS — D696 Thrombocytopenia, unspecified: Secondary | ICD-10-CM | POA: Diagnosis present

## 2016-01-30 DIAGNOSIS — R1010 Upper abdominal pain, unspecified: Secondary | ICD-10-CM

## 2016-01-30 DIAGNOSIS — R1013 Epigastric pain: Secondary | ICD-10-CM

## 2016-01-30 DIAGNOSIS — B9562 Methicillin resistant Staphylococcus aureus infection as the cause of diseases classified elsewhere: Secondary | ICD-10-CM | POA: Diagnosis present

## 2016-01-30 DIAGNOSIS — I70209 Unspecified atherosclerosis of native arteries of extremities, unspecified extremity: Secondary | ICD-10-CM | POA: Diagnosis present

## 2016-01-30 DIAGNOSIS — R509 Fever, unspecified: Secondary | ICD-10-CM

## 2016-01-30 DIAGNOSIS — R7881 Bacteremia: Secondary | ICD-10-CM | POA: Diagnosis present

## 2016-01-30 DIAGNOSIS — D649 Anemia, unspecified: Secondary | ICD-10-CM | POA: Diagnosis present

## 2016-01-30 DIAGNOSIS — R739 Hyperglycemia, unspecified: Secondary | ICD-10-CM | POA: Diagnosis present

## 2016-01-30 DIAGNOSIS — N400 Enlarged prostate without lower urinary tract symptoms: Secondary | ICD-10-CM | POA: Diagnosis present

## 2016-01-30 DIAGNOSIS — E279 Disorder of adrenal gland, unspecified: Secondary | ICD-10-CM

## 2016-01-30 DIAGNOSIS — J90 Pleural effusion, not elsewhere classified: Secondary | ICD-10-CM

## 2016-01-30 DIAGNOSIS — A4902 Methicillin resistant Staphylococcus aureus infection, unspecified site: Secondary | ICD-10-CM

## 2016-01-30 DIAGNOSIS — R079 Chest pain, unspecified: Secondary | ICD-10-CM

## 2016-01-30 DIAGNOSIS — M479 Spondylosis, unspecified: Secondary | ICD-10-CM | POA: Diagnosis present

## 2016-01-30 LAB — COMPREHENSIVE METABOLIC PANEL
ALBUMIN: 2.9 g/dL — AB (ref 3.5–5.0)
ALK PHOS: 60 U/L (ref 38–126)
ALT: 15 U/L — ABNORMAL LOW (ref 17–63)
ANION GAP: 6 (ref 5–15)
AST: 20 U/L (ref 15–41)
BILIRUBIN TOTAL: 1.3 mg/dL — AB (ref 0.3–1.2)
BUN: 24 mg/dL — AB (ref 6–20)
CALCIUM: 8.6 mg/dL — AB (ref 8.9–10.3)
CO2: 22 mmol/L (ref 22–32)
Chloride: 100 mmol/L — ABNORMAL LOW (ref 101–111)
Creatinine, Ser: 1 mg/dL (ref 0.61–1.24)
GFR calc Af Amer: >60 mL/min (ref >60)
GFR calc non Af Amer: >60 mL/min (ref >60)
GLUCOSE: 101 mg/dL — AB (ref 65–99)
Potassium: 4.1 mmol/L (ref 3.5–5.1)
Sodium: 128 mmol/L — ABNORMAL LOW (ref 135–145)
TOTAL PROTEIN: 6 g/dL — AB (ref 6.5–8.1)

## 2016-01-30 LAB — CBC
HCT: 37.8 % — ABNORMAL LOW (ref 39.0–52.0)
Hemoglobin: 12.4 g/dL — ABNORMAL LOW (ref 13.0–17.0)
MCH: 31.9 pg (ref 26.0–34.0)
MCHC: 32.8 g/dL (ref 30.0–36.0)
MCV: 97.2 fL (ref 78.0–100.0)
PLATELETS: 129 10*3/uL — AB (ref 150–400)
RBC: 3.89 MIL/uL — ABNORMAL LOW (ref 4.22–5.81)
RDW: 13.3 % (ref 11.5–15.5)
WBC: 18.7 10*3/uL — ABNORMAL HIGH (ref 4.0–10.5)

## 2016-01-30 LAB — LACTIC ACID, PLASMA
LACTIC ACID, VENOUS: 0.8 mmol/L (ref 0.5–1.9)
LACTIC ACID, VENOUS: 0.8 mmol/L (ref 0.5–1.9)

## 2016-01-30 LAB — PROCALCITONIN: Procalcitonin: 1.85 ng/mL

## 2016-01-30 MED ORDER — HYDROMORPHONE HCL 1 MG/ML IJ SOLN
2.0000 mg | Freq: Once | INTRAMUSCULAR | Status: AC
  Administered 2016-01-30: 2 mg via INTRAVENOUS
  Filled 2016-01-30: qty 2

## 2016-01-30 MED ORDER — FENTANYL CITRATE (PF) 100 MCG/2ML IJ SOLN
12.5000 ug | INTRAMUSCULAR | Status: DC | PRN
  Administered 2016-01-30 – 2016-02-01 (×3): 12.5 ug via INTRAVENOUS
  Filled 2016-01-30 (×3): qty 2

## 2016-01-30 MED ORDER — WHITE PETROLATUM GEL
Status: AC
  Administered 2016-01-30: 17:00:00
  Filled 2016-01-30: qty 1

## 2016-01-30 NOTE — Consult Note (Signed)
[facility] for Infect[facility]    Date of Admission:  01/28/2016   Total days of antibiotics 2        Day 1 vancomycin               Reason for Consult: MRSA bacteremia    Referring Physician: Dr. Linna Darner  Principal Problem:   MRSA bacteremia Active Problems:   Epigastric pain   Rheumatoid arthritis involving multiple sites with positive rheumatoid factor (HCC)   Essential hypertension   Hyponatremia   Left adrenal mass (HCC)   Hyperglycemia   Normocytic anemia   Thrombocytopenia (HCC)   Atherosclerotic peripheral vascular disease (HCC)   Degenerative joint disease of spine   BPH (benign prostatic hyperplasia)   . amLODipine  2.5 mg Oral Daily  . enoxaparin (LOVENOX) injection  40 mg Subcutaneous Q24H  . methotrexate  20 mg Oral Q Mon  . metoprolol succinate  50 mg Oral Daily  . tamsulosin  0.4 mg Oral QPC breakfast  . vancomycin  1,000 mg Intravenous Q12H    Recommendations: 1. Continue vancomycin 2. Agree with TEE 3. Repeat blood cultures 4. Consider MRI of thoracic and lumbar spine   Assessment: Mr. Tanner Rice has MRSA bacteremia. He may have come from his recent rash on his chest. The cause of his severe pain is unclear. He has developed some new pleural effusions but has no other clinical evidence to suggest pneumonia. I am concerned that he may have early spine infection. I agree with repeat blood cultures and TEE. I would continue vancomycin and hold off on PICC placement until we know repeat blood cultures are negative. If his severe pain continues I would proceed with MRI of the thoracic and lumbar spine.   HPI: Tanner Rice is a 82 y.o. male who had sudden onset of upper abdominal and lower chest pain 2 days ago. The pain became so severe he drove himself to the [facility] emergency department where he was found to be febrile. He was admitted and started on empiric antibiotics for the possibility of intra-abdominal  infection but his CT scan did not reveal any acute intra-abdominal problem. Both admission blood cultures have grown MRSA. Today's chest x-ray shows some new bilateral pleural effusions. He rates his pain as 20 out of 10. His pain is worse with movement.   Review of Systems: Review of Systems  Constitutional: Positive for fever and malaise/fatigue. Negative for chills, diaphoresis and weight loss.  HENT: Negative for sore throat.        He has had some recent postnasal drip and dry mouth and throat.  Respiratory: Negative for cough, sputum production and shortness of breath.   Cardiovascular: Positive for chest pain.  Gastrointestinal: Positive for abdominal pain, constipation, nausea and vomiting. Negative for diarrhea.  Genitourinary: Negative for dysuria.  Musculoskeletal: Positive for joint pain and neck pain. Negative for myalgias.       Chronic, unchanged in the pain and joint pains from his rheumatoid arthritis and degenerative spine disease.  Skin: Positive for rash.       He recently developed a pruritic rash on his right anterior chest. He saw his dermatologist, Dr. Rolm Bookbinder, and was prescribed a steroid cream. His rash has resolved.  Neurological: Negative for dizziness, sensory change, focal weakness and headaches.    Past Medical History:  Diagnosis Date  . Hypertension   . Melanoma ([place])   . Rheumatoid  arthritis(714.0)     Social History  Substance Use Topics  . Smoking status: Former Research scientist (life sciences)  . Smokeless tobacco: Never Used  . Alcohol use Yes     Comment: glass of wine a day     Family History  Problem Relation Age of Onset  . Prostate cancer Father   . Prostate cancer Brother   . Osteoarthritis Brother    Allergies  Allergen Reactions  . Other Diarrhea, Nausea And Vomiting and Other (See Comments)    Seafood - muscles   . Penicillins Cross Reactors Hives    OBJECTIVE: Blood pressure 133/65, pulse 71, temperature 99.5 F (37.5 C), temperature source  Oral, resp. rate 19, height 5\' 6"  (1.676 m), weight 166 lb 3.2 oz (75.4 kg), SpO2 97 %.  Physical Exam  Constitutional: He is oriented to person, place, and time.  His resting quietly in bed. He is uncomfortable due to pain. His wife is at the bedside.  HENT:  Mouth/Throat: No oropharyngeal exudate.  Eyes: Conjunctivae are normal.  Cardiovascular: Normal rate and regular rhythm.   No murmur heard. Pulmonary/Chest: Effort normal and breath sounds normal. He has no wheezes. He has no rales.  Abdominal: Soft. He exhibits distension. He exhibits no mass. There is no tenderness.  Musculoskeletal: Normal range of motion. He exhibits no edema or tenderness.  Neurological: He is alert and oriented to person, place, and time.  Skin: No rash noted.  Psychiatric: Mood and affect normal.    Lab Results Lab Results  Component Value Date   WBC 18.7 (H) 01/30/2016   HGB 12.4 (L) 01/30/2016   HCT 37.8 (L) 01/30/2016   MCV 97.2 01/30/2016   PLT 129 (L) 01/30/2016    Lab Results  Component Value Date   CREATININE 1.00 01/30/2016   BUN 24 (H) 01/30/2016   NA 128 (L) 01/30/2016   K 4.1 01/30/2016   CL 100 (L) 01/30/2016   CO2 22 01/30/2016    Lab Results  Component Value Date   ALT 15 (L) 01/30/2016   AST 20 01/30/2016   ALKPHOS 60 01/30/2016   BILITOT 1.3 (H) 01/30/2016     Microbiology: Recent Results (from the past 240 hour(s))  Culture, blood (routine x 2)     Status: None (Preliminary result)   Collection Time: 01/29/16  4:02 AM  Result Value Ref Range Status   Specimen Description BLOOD LEFT ANTECUBITAL  Final   Special Requests BOTTLES DRAWN AEROBIC AND ANAEROBIC 5CC   Final   Culture  Setup Time   Final    GRAM POSITIVE COCCI IN CLUSTERS IN BOTH AEROBIC AND ANAEROBIC BOTTLES Organism ID to follow CRITICAL RESULT CALLED TO, READ BACK BY AND VERIFIED WITH: M BELL 01/29/16 @ [place]  Final   Report Status PENDING  Incomplete  Blood  Culture ID Panel (Reflexed)     Status: Abnormal   Collection Time: 01/29/16  4:02 AM  Result Value Ref Range Status   Enterococcus species NOT DETECTED NOT DETECTED Final   Vancomycin resistance NOT DETECTED NOT DETECTED Final   Listeria monocytogenes NOT DETECTED NOT DETECTED Final   Staphylococcus species DETECTED (A) NOT DETECTED Final    Comment: CRITICAL RESULT CALLED TO, READ BACK BY AND VERIFIED WITH: M BELL 01/29/16 @ 1929 M VESTAL    Staphylococcus aureus DETECTED (A) NOT DETECTED Final    Comment: CRITICAL RESULT CALLED TO, READ BACK BY AND VERIFIED WITH: M BELL 01/29/16 @ 1929  M VESTAL    Methicillin resistance DETECTED (A) NOT DETECTED Final    Comment: CRITICAL RESULT CALLED TO, READ BACK BY AND VERIFIED WITH: M BELL 01/29/16 @ 1929 M VESTAL    Streptococcus species NOT DETECTED NOT DETECTED Final   Streptococcus agalactiae NOT DETECTED NOT DETECTED Final   Streptococcus pneumoniae NOT DETECTED NOT DETECTED Final   Streptococcus pyogenes NOT DETECTED NOT DETECTED Final   Acinetobacter baumannii NOT DETECTED NOT DETECTED Final   Enterobacteriaceae species NOT DETECTED NOT DETECTED Final   Enterobacter cloacae complex NOT DETECTED NOT DETECTED Final   Escherichia coli NOT DETECTED NOT DETECTED Final   Klebsiella oxytoca NOT DETECTED NOT DETECTED Final   Klebsiella pneumoniae NOT DETECTED NOT DETECTED Final   Proteus species NOT DETECTED NOT DETECTED Final   Serratia marcescens NOT DETECTED NOT DETECTED Final   Carbapenem resistance NOT DETECTED NOT DETECTED Final   Haemophilus influenzae NOT DETECTED NOT DETECTED Final   Neisseria meningitidis NOT DETECTED NOT DETECTED Final   Pseudomonas aeruginosa NOT DETECTED NOT DETECTED Final   Candida albicans NOT DETECTED NOT DETECTED Final   Candida glabrata NOT DETECTED NOT DETECTED Final   Candida krusei NOT DETECTED NOT DETECTED Final   Candida parapsilosis NOT DETECTED NOT DETECTED Final   Candida tropicalis NOT DETECTED NOT  DETECTED Final  Culture, blood (routine x 2)     Status: None (Preliminary result)   Collection Time: 01/29/16  4:05 AM  Result Value Ref Range Status   Specimen Description BLOOD LEFT ANTECUBITAL  Final   Special Requests BOTTLES DRAWN AEROBIC AND ANAEROBIC 5CC   Final   Culture  Setup Time   Final    GRAM POSITIVE COCCI IN CLUSTERS IN BOTH AEROBIC AND ANAEROBIC BOTTLES CRITICAL VALUE NOTED.  VALUE IS CONSISTENT WITH PREVIOUSLY REPORTED AND CALLED VALUE.    Culture GRAM POSITIVE COCCI  Final   Report Status PENDING  Incomplete    Michel Bickers, MD [facility] [phone] pager   [phone] cell 01/30/2016, 2:55 PM

## 2016-01-30 NOTE — Progress Notes (Signed)
[facility] PROGRESS NOTE  Tanner Rice [id number] DOB: 10-Sep-1933 DOA: 01/28/2016 PCP: Mathews Argyle, MD  Assessment/Plan: 1. MRSA bacteremia: Initially presented as abdominal pain and fever and started on treatment for presumed diverticulitis. Patient's blood cultures returned positive 2 for MRSA. I suspect a portion of patient's abdominal pain is actually from diaphragm irritation that may be from early pneumonia. Patient continues to complain of this this a.m. Doubt diverticulitis at this stage. Patient is immune compromised due to treatment with methotrexate - DC Levaquin and Flagyl - CXR, KUB - Continue IVF - Start vancomycin IV - Discussed case with ID, Dr. Megan Salon who will follow. - Repeat blood cultures on 01/31/2016 - HIV - TEE scheduled for a 20 10/28/2015, cardiology aware - MRSA nasal swab pending - A.m. labs pending, CBC, CMP - Added Propulsid tone and and lactic acid    2. Hyponatremia:  a.m. labs pending -Repeat metabolic panel  3. HTN:  -Continue home amlodipine and metoprolol -Hold lisinopril for now  4. RA:  -Continue methotrexate, which is due Monday -On Remicade  5. Left adrenal mass:  -Adrenal protocol CT either before discharge or as an outpatient    Code Status: FULL  Family Communication: son and daughter in [place] Disposition Plan: pending workup and improvement   Consultants:  ID  Procedures:  TEE - 02/02/16 - PENDING  Antibiotics:  Levaquin - 8/20>>>8/22  Flagyl - 8/20>>8/22  Vancomycin - 8/21 (Started by Pharmacy immediately after + BCX noted) >>  HPI/Subjective: Pain is unchanged since time of admission. Intermittent frothy bloody sputum. Tolerating clears without difficulty. No BM since admission. Pain remains in the upper abdominal region and is worse with inspiration  Objective: Vitals:   01/29/16 2315 01/30/16 0449  BP:  133/65  Pulse:  71  Resp:  19  Temp: 99.7 F (37.6 C) 99.5 F (37.5 C)     Intake/Output Summary (Last 24 hours) at 01/30/16 0934 Last data filed at 01/30/16 0600  Gross per 24 hour  Intake             3280 ml  Output              800 ml  Net             2480 ml   Filed Weights   01/28/16 1659 01/28/16 1710 01/29/16 0145  Weight: 73.5 kg (162 lb) 73.5 kg (162 lb) 75.4 kg (166 lb 3.2 oz)    Exam:   General:  No acute distress, lying comfortably in bed  Cardiovascular: Regular rate and rhythm, 2/6 systolic murmur  Respiratory: Decreased breath sounds in bases with intermittent crackles. Normal effort  Abdomen: NABS, soft, nontender to palpation  Musculoskeletal: Normal tone bilaterally in upper and lower extremities, no bony abnormalities, moves all extremity coordinated fashion.   Data Reviewed: Basic Metabolic Panel:  Recent Labs Lab 01/28/16 1734 01/29/16 0405  NA 133* 131*  K 3.7 3.6  CL 99* 98*  CO2 26 26  GLUCOSE 125* 133*  BUN 17 13  CREATININE 0.72 0.84  CALCIUM 9.4 8.9   Liver Function Tests:  Recent Labs Lab 01/28/16 1734 01/29/16 0405  AST 27 20  ALT 20 17  ALKPHOS 73 57  BILITOT 0.9 1.3*  PROT 7.1 5.8*  ALBUMIN 4.2 3.3*    Recent Labs Lab 01/28/16 1734  LIPASE 18   No results for input(s): AMMONIA in the last 168 hours. CBC:  Recent Labs Lab 01/28/16 1734 01/29/16 0405  WBC 12.0* 16.9*  NEUTROABS 9.6*  --   HGB 14.0 12.7*  HCT 39.8 38.0*  MCV 93.4 95.0  PLT 160 141*   Cardiac Enzymes:  Recent Labs Lab 01/28/16 1734  TROPONINI <0.03   BNP (last 3 results) No results for input(s): BNP in the last 8760 hours.  ProBNP (last 3 results) No results for input(s): PROBNP in the last 8760 hours.  CBG: No results for input(s): GLUCAP in the last 168 hours.  Recent Results (from the past 240 hour(s))  Culture, blood (routine x 2)     Status: None (Preliminary result)   Collection Time: 01/29/16  4:02 AM  Result Value Ref Range Status   Specimen Description BLOOD LEFT ANTECUBITAL  Final    Special Requests BOTTLES DRAWN AEROBIC AND ANAEROBIC 5CC   Final   Culture  Setup Time   Final    GRAM POSITIVE COCCI IN CLUSTERS IN BOTH AEROBIC AND ANAEROBIC BOTTLES Organism ID to follow CRITICAL RESULT CALLED TO, READ BACK BY AND VERIFIED WITH: M BELL 01/29/16 @ [place]  Final   Report Status PENDING  Incomplete  Blood Culture ID Panel (Reflexed)     Status: Abnormal   Collection Time: 01/29/16  4:02 AM  Result Value Ref Range Status   Enterococcus species NOT DETECTED NOT DETECTED Final   Vancomycin resistance NOT DETECTED NOT DETECTED Final   Listeria monocytogenes NOT DETECTED NOT DETECTED Final   Staphylococcus species DETECTED (A) NOT DETECTED Final    Comment: CRITICAL RESULT CALLED TO, READ BACK BY AND VERIFIED WITH: M BELL 01/29/16 @ 1929 M VESTAL    Staphylococcus aureus DETECTED (A) NOT DETECTED Final    Comment: CRITICAL RESULT CALLED TO, READ BACK BY AND VERIFIED WITH: M BELL 01/29/16 @ 1929 M VESTAL    Methicillin resistance DETECTED (A) NOT DETECTED Final    Comment: CRITICAL RESULT CALLED TO, READ BACK BY AND VERIFIED WITH: M BELL 01/29/16 @ 1929 M VESTAL    Streptococcus species NOT DETECTED NOT DETECTED Final   Streptococcus agalactiae NOT DETECTED NOT DETECTED Final   Streptococcus pneumoniae NOT DETECTED NOT DETECTED Final   Streptococcus pyogenes NOT DETECTED NOT DETECTED Final   Acinetobacter baumannii NOT DETECTED NOT DETECTED Final   Enterobacteriaceae species NOT DETECTED NOT DETECTED Final   Enterobacter cloacae complex NOT DETECTED NOT DETECTED Final   Escherichia coli NOT DETECTED NOT DETECTED Final   Klebsiella oxytoca NOT DETECTED NOT DETECTED Final   Klebsiella pneumoniae NOT DETECTED NOT DETECTED Final   Proteus species NOT DETECTED NOT DETECTED Final   Serratia marcescens NOT DETECTED NOT DETECTED Final   Carbapenem resistance NOT DETECTED NOT DETECTED Final   Haemophilus influenzae NOT DETECTED NOT DETECTED  Final   Neisseria meningitidis NOT DETECTED NOT DETECTED Final   Pseudomonas aeruginosa NOT DETECTED NOT DETECTED Final   Candida albicans NOT DETECTED NOT DETECTED Final   Candida glabrata NOT DETECTED NOT DETECTED Final   Candida krusei NOT DETECTED NOT DETECTED Final   Candida parapsilosis NOT DETECTED NOT DETECTED Final   Candida tropicalis NOT DETECTED NOT DETECTED Final  Culture, blood (routine x 2)     Status: None (Preliminary result)   Collection Time: 01/29/16  4:05 AM  Result Value Ref Range Status   Specimen Description BLOOD LEFT ANTECUBITAL  Final   Special Requests BOTTLES DRAWN AEROBIC AND ANAEROBIC 5CC   Final   Culture  Setup Time   Final    GRAM POSITIVE COCCI IN CLUSTERS  IN BOTH AEROBIC AND ANAEROBIC BOTTLES CRITICAL VALUE NOTED.  VALUE IS CONSISTENT WITH PREVIOUSLY REPORTED AND CALLED VALUE.    Culture GRAM POSITIVE COCCI  Final   Report Status PENDING  Incomplete     Studies: Ct Abdomen Pelvis W Contrast  Result Date: 01/28/2016 CLINICAL DATA:  Aching upper abdominal pain EXAM: CT ABDOMEN AND PELVIS WITH CONTRAST TECHNIQUE: Multidetector CT imaging of the abdomen and pelvis was performed using the standard protocol following bolus administration of intravenous contrast. CONTRAST:  111mL ISOVUE-300 IOPAMIDOL (ISOVUE-300) INJECTION 61% COMPARISON:  None. FINDINGS: Lower chest and abdominal wall: Small fatty umbilical hernia. Small fatty inguinal hernias. Nonspecific subpleural reticulation at the bases without honeycombing. Hepatobiliary: No focal liver abnormality.No evidence of biliary obstruction or stone. Pancreas: Unremarkable. Spleen: Unremarkable. Adrenals/Urinary Tract: 22 mm left adrenal nodule with nonspecific appearance on this postcontrast only exam. No hydronephrosis or stone. 29 mm left lower pole cortical and hilar cyst. Smaller right renal cyst. Unremarkable bladder. Stomach/Bowel: No obstruction. Colonic diverticulosis. Appendix not well seen; no  pericecal inflammation. Reproductive:Marked prostate enlargement, deforming the bladder base, with nonspecific heterogeneity. Vascular/Lymphatic: No acute vascular abnormality. Aortic and branch vessel atherosclerotic calcification. No mass or adenopathy. Other: No ascites or pneumoperitoneum. Musculoskeletal: Advanced lumbar facet arthropathy with mild L3-4 and L4-5 listhesis. Spondylosis. No acute or aggressive process. IMPRESSION: 1. No acute finding. 2. **An incidental finding of potential clinical significance has been found. 2.2 cm left adrenal mass with nonspecific imaging characteristics. If no outside comparison to document stability, adrenal protocol CT is recommended in this patient with reported history of melanoma.** 3.  Aortic Atherosclerosis (ICD10-170.0) 4. Prominent prostate enlargement Electronically Signed   By: Monte Fantasia M.D.   On: 01/28/2016 20:08   Dg Abdomen Acute W/chest  Result Date: 01/28/2016 CLINICAL DATA:  Epigastric pain for several hours EXAM: DG ABDOMEN ACUTE W/ 1V CHEST COMPARISON:  None. FINDINGS: Cardiac shadow is within normal limits. The lungs are well aerated bilaterally. No focal infiltrate or sizable effusion is seen. No free air is noted. Scattered large and small bowel gas is seen. Degenerative changes of the lumbar spine are noted. IMPRESSION: No acute abnormality noted. Electronically Signed   By: Inez Catalina M.D.   On: 01/28/2016 18:13    Scheduled Meds: . amLODipine  2.5 mg Oral Daily  . enoxaparin (LOVENOX) injection  40 mg Subcutaneous Q24H  . levofloxacin (LEVAQUIN) IV  750 mg Intravenous Q24H  . methotrexate  20 mg Oral Q Mon  . metoprolol succinate  50 mg Oral Daily  . metronidazole  500 mg Intravenous Q8H  . tamsulosin  0.4 mg Oral QPC breakfast  . vancomycin  1,000 mg Intravenous Q12H   Continuous Infusions: . sodium chloride 125 mL/hr at 01/29/16 2217    Principal Problem:   Abdominal pain Active Problems:   Fever   Rheumatoid  arthritis involving multiple sites with positive rheumatoid factor ([place])   Essential hypertension   Hyponatremia   Left adrenal mass ([place])    Time spent: [place], [place] [facility]  If 7PM-7AM, please contact night-coverage at [email or web address], password [facility] 01/30/2016, 9:34 AM  LOS: 1 day

## 2016-01-31 ENCOUNTER — Inpatient Hospital Stay (HOSPITAL_COMMUNITY): Payer: Medicare Other

## 2016-01-31 LAB — CBC
HEMATOCRIT: 37.1 % — AB (ref 39.0–52.0)
Hemoglobin: 12.2 g/dL — ABNORMAL LOW (ref 13.0–17.0)
MCH: 31.4 pg (ref 26.0–34.0)
MCHC: 32.9 g/dL (ref 30.0–36.0)
MCV: 95.6 fL (ref 78.0–100.0)
PLATELETS: 125 10*3/uL — AB (ref 150–400)
RBC: 3.88 MIL/uL — ABNORMAL LOW (ref 4.22–5.81)
RDW: 13.2 % (ref 11.5–15.5)
WBC: 13.4 10*3/uL — ABNORMAL HIGH (ref 4.0–10.5)

## 2016-01-31 LAB — CULTURE, BLOOD (ROUTINE X 2)

## 2016-01-31 LAB — VANCOMYCIN, TROUGH: Vancomycin Tr: 11 ug/mL — ABNORMAL LOW (ref 15–20)

## 2016-01-31 LAB — MRSA CULTURE: CULTURE: NEGATIVE

## 2016-01-31 LAB — BASIC METABOLIC PANEL
Anion gap: 6 (ref 5–15)
BUN: 20 mg/dL (ref 6–20)
CALCIUM: 8.7 mg/dL — AB (ref 8.9–10.3)
CO2: 25 mmol/L (ref 22–32)
CREATININE: 0.91 mg/dL (ref 0.61–1.24)
Chloride: 97 mmol/L — ABNORMAL LOW (ref 101–111)
GFR calc Af Amer: >60 mL/min (ref >60)
GLUCOSE: 115 mg/dL — AB (ref 65–99)
Potassium: 4.1 mmol/L (ref 3.5–5.1)
Sodium: 128 mmol/L — ABNORMAL LOW (ref 135–145)

## 2016-01-31 MED ORDER — BISACODYL 10 MG RE SUPP
10.0000 mg | Freq: Once | RECTAL | Status: AC
  Administered 2016-01-31: 10 mg via RECTAL
  Filled 2016-01-31: qty 1

## 2016-01-31 MED ORDER — LACTULOSE 10 GM/15ML PO SOLN
20.0000 g | Freq: Three times a day (TID) | ORAL | Status: AC
  Administered 2016-01-31 – 2016-02-01 (×3): 20 g via ORAL
  Filled 2016-01-31 (×3): qty 30

## 2016-01-31 MED ORDER — VANCOMYCIN HCL 10 G IV SOLR
1250.0000 mg | Freq: Two times a day (BID) | INTRAVENOUS | Status: DC
  Administered 2016-01-31 – 2016-02-05 (×10): 1250 mg via INTRAVENOUS
  Filled 2016-01-31 (×16): qty 1250

## 2016-01-31 MED ORDER — HYDRALAZINE HCL 20 MG/ML IJ SOLN
10.0000 mg | Freq: Four times a day (QID) | INTRAMUSCULAR | Status: DC | PRN
  Administered 2016-02-02 – 2016-02-03 (×2): 10 mg via INTRAVENOUS
  Filled 2016-01-31 (×3): qty 1

## 2016-01-31 MED ORDER — METOPROLOL TARTRATE 5 MG/5ML IV SOLN
5.0000 mg | Freq: Four times a day (QID) | INTRAVENOUS | Status: DC
  Administered 2016-01-31 – 2016-02-05 (×20): 5 mg via INTRAVENOUS
  Filled 2016-01-31 (×20): qty 5

## 2016-01-31 NOTE — Progress Notes (Signed)
Patient ID: Tanner Rice, male   DOB: January 10, 1934, 82 y.o.   MRN: [id number]          [facility]    Date of Admission:  01/28/2016   Day 2 vancomycin  Mr. Tanner Rice has MRSA bacteremia and severe pain around his lower rib cage and upper abdomen. He has developed ileus and is now in the ICU with an NG tube. His pain is under better control when he is resting quietly. Repeat blood cultures are pending. MRI of his thoracic and lumbar spine have been ordered and he is scheduled for TEE on 02/02/2016. I will continue his vancomycin for now. I have spoken with his wife about these plans.         Michel Bickers, MD [facility] [phone] pager   [phone] cell 06/13/2015, 1:32 PM

## 2016-01-31 NOTE — Progress Notes (Signed)
Report called to [place]. Patient transferred to [facility] 18

## 2016-01-31 NOTE — Progress Notes (Addendum)
[facility] PROGRESS NOTE  MANSEL KIRGAN [id number] DOB: [birth year] DOA: 01/28/2016 PCP: Mathews Argyle, MD   82/M with RA on remicaide and MTX admitted with ABd pain, found to have MRSA bacteremia Now having more low back pain, abd distension and upper abd pain  Assessment/Plan: 1. Sepsis/MRSA bacteremia: Initially presented as abdominal pain and fever and started on treatment for presumed diverticulitis. Patient's blood cultures returned positive 2 for MRSA.  -Continue Iv Vanc Day 2, repeat Blood Cx 8/23 -Appreciate ID consult, had a rash on upper chest recently which resolved, wonder if that caused MRSA seeding -check MRI L and T spine due to back pain in setting of bacteremia -scheduled for TEE on 8/25 per Dr.Merrel's note -lactate reassuring  2. Ileus: -increasing abd distension -recent CT unremarkable -KUB 8/22 with ileus, contrast seen in colon, hence no SBO -add lactulose and dulcolax suppository -make NPO, IVF, may need NGT-will reassess this afternoon  2. Hyponatremia:  -stable, continue IVf while NPO  3. HTN:  -hold amlodipine and metoprolol -give IV BB while NPO  4. RA:  -Hold methotrexate -On Remicade  5. Left adrenal mass:  -Adrenal protocol CT as an outpatient once all acute issues resolved  DVt proph: lovenox  Code Status: FULL  Family Communication: wife at bedside Disposition Plan: Tx to SDU   Consultants:  ID  Procedures:  TEE - 02/02/16 - PENDING  Antibiotics:  Levaquin - 8/20>>>8/22  Flagyl - 8/20>>8/22  Vancomycin - 8/21 (Started by Pharmacy immediately after + [place] noted) >>  HPI/Subjective: Having more upper abd pain, and back pain  Objective: Vitals:   01/30/16 2230 01/31/16 0435  BP:  138/68  Pulse:  (!) 52  Resp:  19  Temp: 99.8 F (37.7 C) 98.6 F (37 C)    Intake/Output Summary (Last 24 hours) at 01/31/16 1027 Last data filed at 01/31/16 1021  Gross per 24 hour  Intake          2122.92 ml   Output              600 ml  Net          1522.92 ml   Filed Weights   01/28/16 1659 01/28/16 1710 01/29/16 0145  Weight: 73.5 kg (162 lb) 73.5 kg (162 lb) 75.4 kg (166 lb 3.2 oz)    Exam:   General:  Somnolent, arousable, oriented x2  Cardiovascular: Regular rate and rhythm, 2/6 systolic murmur  Respiratory: Decreased breath sounds in bases with intermittent crackles. Normal effort  Abdomen: distended, diminished BS, non tender  Musculoskeletal: Normal tone bilaterally in upper and lower extremities, no bony abnormalities, moves all extremity coordinated fashion.   Ext: no edema  Data Reviewed: Basic Metabolic Panel:  Recent Labs Lab 01/28/16 1734 01/29/16 0405 01/30/16 1001 01/31/16 0623  NA 133* 131* 128* 128*  K 3.7 3.6 4.1 4.1  CL 99* 98* 100* 97*  CO2 26 26 22 25   GLUCOSE 125* 133* 101* 115*  BUN 17 13 24* 20  CREATININE 0.72 0.84 1.00 0.91  CALCIUM 9.4 8.9 8.6* 8.7*   Liver Function Tests:  Recent Labs Lab 01/28/16 1734 01/29/16 0405 01/30/16 1001  AST 27 20 20   ALT 20 17 15*  ALKPHOS 73 57 60  BILITOT 0.9 1.3* 1.3*  PROT 7.1 5.8* 6.0*  ALBUMIN 4.2 3.3* 2.9*    Recent Labs Lab 01/28/16 1734  LIPASE 18   No results for input(s): AMMONIA in the last 168 hours. CBC:  Recent Labs Lab  01/28/16 1734 01/29/16 0405 01/30/16 1001 01/31/16 0623  WBC 12.0* 16.9* 18.7* 13.4*  NEUTROABS 9.6*  --   --   --   HGB 14.0 12.7* 12.4* 12.2*  HCT 39.8 38.0* 37.8* 37.1*  MCV 93.4 95.0 97.2 95.6  PLT 160 141* 129* 125*   Cardiac Enzymes:  Recent Labs Lab 01/28/16 1734  TROPONINI <0.03   BNP (last 3 results) No results for input(s): BNP in the last 8760 hours.  ProBNP (last 3 results) No results for input(s): PROBNP in the last 8760 hours.  CBG: No results for input(s): GLUCAP in the last 168 hours.  Recent Results (from the past 240 hour(s))  Culture, blood (routine x 2)     Status: Abnormal (Preliminary result)   Collection Time:  01/29/16  4:02 AM  Result Value Ref Range Status   Specimen Description BLOOD LEFT ANTECUBITAL  Final   Special Requests BOTTLES DRAWN AEROBIC AND ANAEROBIC 5CC   Final   Culture  Setup Time   Final    GRAM POSITIVE COCCI IN CLUSTERS IN BOTH AEROBIC AND ANAEROBIC BOTTLES Organism ID to follow CRITICAL RESULT CALLED TO, READ BACK BY AND VERIFIED WITH: M BELL 01/29/16 @ [place] (A)  Final   Report Status PENDING  Incomplete  Blood Culture ID Panel (Reflexed)     Status: Abnormal   Collection Time: 01/29/16  4:02 AM  Result Value Ref Range Status   Enterococcus species NOT DETECTED NOT DETECTED Final   Vancomycin resistance NOT DETECTED NOT DETECTED Final   Listeria monocytogenes NOT DETECTED NOT DETECTED Final   Staphylococcus species DETECTED (A) NOT DETECTED Final    Comment: CRITICAL RESULT CALLED TO, READ BACK BY AND VERIFIED WITH: M BELL 01/29/16 @ 1929 M VESTAL    Staphylococcus aureus DETECTED (A) NOT DETECTED Final    Comment: CRITICAL RESULT CALLED TO, READ BACK BY AND VERIFIED WITH: M BELL 01/29/16 @ 1929 M VESTAL    Methicillin resistance DETECTED (A) NOT DETECTED Final    Comment: CRITICAL RESULT CALLED TO, READ BACK BY AND VERIFIED WITH: M BELL 01/29/16 @ 1929 M VESTAL    Streptococcus species NOT DETECTED NOT DETECTED Final   Streptococcus agalactiae NOT DETECTED NOT DETECTED Final   Streptococcus pneumoniae NOT DETECTED NOT DETECTED Final   Streptococcus pyogenes NOT DETECTED NOT DETECTED Final   Acinetobacter baumannii NOT DETECTED NOT DETECTED Final   Enterobacteriaceae species NOT DETECTED NOT DETECTED Final   Enterobacter cloacae complex NOT DETECTED NOT DETECTED Final   Escherichia coli NOT DETECTED NOT DETECTED Final   Klebsiella oxytoca NOT DETECTED NOT DETECTED Final   Klebsiella pneumoniae NOT DETECTED NOT DETECTED Final   Proteus species NOT DETECTED NOT DETECTED Final   Serratia marcescens NOT DETECTED NOT DETECTED  Final   Carbapenem resistance NOT DETECTED NOT DETECTED Final   Haemophilus influenzae NOT DETECTED NOT DETECTED Final   Neisseria meningitidis NOT DETECTED NOT DETECTED Final   Pseudomonas aeruginosa NOT DETECTED NOT DETECTED Final   Candida albicans NOT DETECTED NOT DETECTED Final   Candida glabrata NOT DETECTED NOT DETECTED Final   Candida krusei NOT DETECTED NOT DETECTED Final   Candida parapsilosis NOT DETECTED NOT DETECTED Final   Candida tropicalis NOT DETECTED NOT DETECTED Final  Culture, blood (routine x 2)     Status: Abnormal (Preliminary result)   Collection Time: 01/29/16  4:05 AM  Result Value Ref Range Status   Specimen Description BLOOD LEFT ANTECUBITAL  Final  Special Requests BOTTLES DRAWN AEROBIC AND ANAEROBIC 5CC   Final   Culture  Setup Time   Final    GRAM POSITIVE COCCI IN CLUSTERS IN BOTH AEROBIC AND ANAEROBIC BOTTLES CRITICAL VALUE NOTED.  VALUE IS CONSISTENT WITH PREVIOUSLY REPORTED AND CALLED VALUE.    Culture STAPHYLOCOCCUS AUREUS (A)  Final   Report Status PENDING  Incomplete  Culture, blood (routine x 2)     Status: None (Preliminary result)   Collection Time: 01/31/16  6:20 AM  Result Value Ref Range Status   Specimen Description BLOOD LEFT ANTECUBITAL  Final   Special Requests IN PEDIATRIC BOTTLE 3CC  Final   Culture PENDING  Incomplete   Report Status PENDING  Incomplete     Studies: Dg Chest 2 View  Result Date: 01/30/2016 CLINICAL DATA:  Bilateral lower chest pain and epigastric pain. EXAM: CHEST  2 VIEW COMPARISON:  01/28/2016 FINDINGS: There is cardiomegaly. The patient has developed bilateral pleural effusions with lower lobe atelectasis and/or pneumonia. Upper lungs are clear. IMPRESSION: Development of bilateral pleural effusions with lower lobe atelectasis and/or pneumonia. Electronically Signed   By: Nelson Chimes M.D.   On: 01/30/2016 11:23   Dg Abd 1 View  Result Date: 01/30/2016 CLINICAL DATA:  Chest pain and epigastric pain,  worsening recently. EXAM: ABDOMEN - 1 VIEW COMPARISON:  01/28/2016 FINDINGS: There is no ileus pattern with air throughout the small and large bowel. No evidence of obstruction. Urinary tract con stress is noted. Previously administered oral contrast present in the right colon. No acute bone finding. IMPRESSION: Diffuse ileus pattern. Electronically Signed   By: Nelson Chimes M.D.   On: 01/30/2016 11:24    Scheduled Meds: . enoxaparin (LOVENOX) injection  40 mg Subcutaneous Q24H  . metoprolol succinate  50 mg Oral Daily  . tamsulosin  0.4 mg Oral QPC breakfast  . vancomycin  1,000 mg Intravenous Q12H   Continuous Infusions: . sodium chloride 100 mL/hr at 01/31/16 [id number]    Principal Problem:   MRSA bacteremia Active Problems:   Rheumatoid arthritis involving multiple sites with positive rheumatoid factor ([facility])   Essential hypertension   Hyponatremia   Left adrenal mass ([facility])   Epigastric pain   Hyperglycemia   Normocytic anemia   Thrombocytopenia ([facility])   Atherosclerotic peripheral vascular disease ([facility])   Degenerative joint disease of spine   BPH (benign prostatic hyperplasia)    Time spent: 39min    Seher Schlagel  [phone] [facility]  If 7PM-7AM, please contact night-coverage at [email or web address], password [facility] 01/31/2016, 10:27 AM  LOS: 2 days

## 2016-01-31 NOTE — Progress Notes (Signed)
Pharmacy Antibiotic Note  Tanner Rice is a 82 y.o. male admitted on 01/28/2016 with abdominal pain. Pharmacy consulted for Vancomycin dosing on 8/21 when BCID positive for GPC in clusters;  MRSA bacteremia. MIC to Vanc =1    Day # 3 Vancomycin 1gm IV q12hrs.   Vanc trough level tonight is 11 mcg/ml, below target trough levels on 15-20 mcg/ml  Plan:  Increase Vancomycin to 1250 mg IV q12hrs.  Recheck Vanc trough level at steady-state.  Follow renal function, final culture data, studies, and progress.  Height: 5\' 6"  (167.6 cm) Weight: 166 lb 3.2 oz (75.4 kg) IBW/kg (Calculated) : 63.8  Temp (24hrs), Avg:99.3 F (37.4 C), Min:98.6 F (37 C), Max:99.8 F (37.7 C)   Recent Labs Lab 01/28/16 1734 01/28/16 1746 01/29/16 0405 01/30/16 1001 01/30/16 1007 01/30/16 1228 01/31/16 0623 01/31/16 2038  WBC 12.0*  --  16.9* 18.7*  --   --  13.4*  --   CREATININE 0.72  --  0.84 1.00  --   --  0.91  --   LATICACIDVEN  --  0.98  --   --  0.8 0.8  --   --   VANCOTROUGH  --   --   --   --   --   --   --  11*    Estimated Creatinine Clearance: 58.4 mL/min (by C-G formula based on SCr of 0.91 mg/dL).    Allergies  Allergen Reactions  . Other Diarrhea, Nausea And Vomiting and Other (See Comments)    Seafood - muscles   . Penicillins Cross Reactors Hives    Antimicrobials this admission:  Cipro x 1 on 8/20  Flagyl 8/20>>8/22  Levaquin 8/21>>8/22  Vancomycin 8/21>>  Dose adjustments this admission:   8/23 pm: Vanc trough 11 mcg/ml on Vanc 1gm IV q12hrs -> increased to 1250 mg IV q12hrs  Microbiology results:   8/23 blood x 2 - sent - GPC in clusters   8/22 MRSA nasal swab negative   8/21 blood x 2 - MRSA, MIC to Vanc = 1  Arty Baumgartner, [place] Pager: [phone] 01/31/2016 10:25 PM

## 2016-01-31 NOTE — Progress Notes (Signed)
PHARMACY - PHYSICIAN COMMUNICATION CRITICAL VALUE ALERT - BLOOD CULTURE IDENTIFICATION (BCID)  Results for orders placed or performed during the hospital encounter of 01/28/16  Blood Culture ID Panel (Reflexed) (Collected: 01/29/2016  4:02 AM)  Result Value Ref Range   Enterococcus species NOT DETECTED NOT DETECTED   Vancomycin resistance NOT DETECTED NOT DETECTED   Listeria monocytogenes NOT DETECTED NOT DETECTED   Staphylococcus species DETECTED (A) NOT DETECTED   Staphylococcus aureus DETECTED (A) NOT DETECTED   Methicillin resistance DETECTED (A) NOT DETECTED   Streptococcus species NOT DETECTED NOT DETECTED   Streptococcus agalactiae NOT DETECTED NOT DETECTED   Streptococcus pneumoniae NOT DETECTED NOT DETECTED   Streptococcus pyogenes NOT DETECTED NOT DETECTED   Acinetobacter baumannii NOT DETECTED NOT DETECTED   Enterobacteriaceae species NOT DETECTED NOT DETECTED   Enterobacter cloacae complex NOT DETECTED NOT DETECTED   Escherichia coli NOT DETECTED NOT DETECTED   Klebsiella oxytoca NOT DETECTED NOT DETECTED   Klebsiella pneumoniae NOT DETECTED NOT DETECTED   Proteus species NOT DETECTED NOT DETECTED   Serratia marcescens NOT DETECTED NOT DETECTED   Carbapenem resistance NOT DETECTED NOT DETECTED   Haemophilus influenzae NOT DETECTED NOT DETECTED   Neisseria meningitidis NOT DETECTED NOT DETECTED   Pseudomonas aeruginosa NOT DETECTED NOT DETECTED   Candida albicans NOT DETECTED NOT DETECTED   Candida glabrata NOT DETECTED NOT DETECTED   Candida krusei NOT DETECTED NOT DETECTED   Candida parapsilosis NOT DETECTED NOT DETECTED   Candida tropicalis NOT DETECTED NOT DETECTED     Changes to prescribed antibiotics required: None. Patient is on vancomycin and to continue therapy for now.  Hildred Laser, Pharm D 01/31/2016 9:23 PM

## 2016-01-31 NOTE — Progress Notes (Signed)
    [facility] has been requested to perform a transesophageal echocardiogram on Tanner Rice for bacturemia.  After careful review of history and examination, the risks and benefits of transesophageal echocardiogram have been explained to the pt and his wife, including risks of esophageal damage, perforation (1:10,000 risk), bleeding, pharyngeal hematoma as well as other potential complications associated with conscious sedation including aspiration, arrhythmia, respiratory failure and death. Alternatives to treatment were discussed, questions were answered. Patient is willing to proceed.   It should be noted that when I went to see Tanner Rice he was uncomfortable and has an obvious ileus. TEE is scheduled for Friday * am with Dr Marlou Porch but this may have to be put off depending on the pt's condition Friday Tanner Rice, [place] 01/31/2016 5:04 PM

## 2016-02-01 ENCOUNTER — Inpatient Hospital Stay (HOSPITAL_COMMUNITY): Payer: Medicare Other

## 2016-02-01 DIAGNOSIS — R110 Nausea: Secondary | ICD-10-CM

## 2016-02-01 DIAGNOSIS — R140 Abdominal distension (gaseous): Secondary | ICD-10-CM

## 2016-02-01 DIAGNOSIS — R5381 Other malaise: Secondary | ICD-10-CM

## 2016-02-01 LAB — COMPREHENSIVE METABOLIC PANEL
ALT: 17 U/L (ref 17–63)
ANION GAP: 6 (ref 5–15)
AST: 22 U/L (ref 15–41)
Albumin: 2.2 g/dL — ABNORMAL LOW (ref 3.5–5.0)
Alkaline Phosphatase: 56 U/L (ref 38–126)
BILIRUBIN TOTAL: 1.5 mg/dL — AB (ref 0.3–1.2)
BUN: 18 mg/dL (ref 6–20)
CHLORIDE: 99 mmol/L — AB (ref 101–111)
CO2: 23 mmol/L (ref 22–32)
Calcium: 8.3 mg/dL — ABNORMAL LOW (ref 8.9–10.3)
Creatinine, Ser: 0.75 mg/dL (ref 0.61–1.24)
Glucose, Bld: 117 mg/dL — ABNORMAL HIGH (ref 65–99)
POTASSIUM: 3.7 mmol/L (ref 3.5–5.1)
Sodium: 128 mmol/L — ABNORMAL LOW (ref 135–145)
TOTAL PROTEIN: 4.9 g/dL — AB (ref 6.5–8.1)

## 2016-02-01 LAB — CBC
HEMATOCRIT: 32.9 % — AB (ref 39.0–52.0)
Hemoglobin: 11 g/dL — ABNORMAL LOW (ref 13.0–17.0)
MCH: 31.3 pg (ref 26.0–34.0)
MCHC: 33.4 g/dL (ref 30.0–36.0)
MCV: 93.7 fL (ref 78.0–100.0)
PLATELETS: 134 10*3/uL — AB (ref 150–400)
RBC: 3.51 MIL/uL — ABNORMAL LOW (ref 4.22–5.81)
RDW: 13 % (ref 11.5–15.5)
WBC: 10.4 10*3/uL (ref 4.0–10.5)

## 2016-02-01 LAB — HIV ANTIBODY (ROUTINE TESTING W REFLEX): HIV SCREEN 4TH GENERATION: NONREACTIVE

## 2016-02-01 LAB — PROCALCITONIN: Procalcitonin: 1.37 ng/mL

## 2016-02-01 MED ORDER — FENTANYL CITRATE (PF) 100 MCG/2ML IJ SOLN: 25.0000 ug | INTRAMUSCULAR | Status: DC | PRN

## 2016-02-01 MED ORDER — BISACODYL 10 MG RE SUPP
10.0000 mg | Freq: Once | RECTAL | Status: AC
  Administered 2016-02-01: 10 mg via RECTAL
  Filled 2016-02-01: qty 1

## 2016-02-01 MED ORDER — ATROPINE SULFATE 1 MG/10ML IJ SOSY
PREFILLED_SYRINGE | INTRAMUSCULAR | Status: AC
  Filled 2016-02-01: qty 10

## 2016-02-01 MED ORDER — LACTULOSE 10 GM/15ML PO SOLN
20.0000 g | Freq: Three times a day (TID) | ORAL | Status: AC
  Administered 2016-02-01: 20 g via ORAL
  Filled 2016-02-01 (×2): qty 30

## 2016-02-01 MED ORDER — HYDROMORPHONE HCL 1 MG/ML IJ SOLN
1.0000 mg | INTRAMUSCULAR | Status: DC | PRN
  Administered 2016-02-01 – 2016-02-02 (×4): 2 mg via INTRAVENOUS
  Administered 2016-02-02: 1 mg via INTRAVENOUS
  Administered 2016-02-02: 2 mg via INTRAVENOUS
  Administered 2016-02-02: 1 mg via INTRAVENOUS
  Administered 2016-02-03 (×5): 2 mg via INTRAVENOUS
  Administered 2016-02-04: 1 mg via INTRAVENOUS
  Administered 2016-02-04: 2 mg via INTRAVENOUS
  Administered 2016-02-04: 1 mg via INTRAVENOUS
  Administered 2016-02-04: 2 mg via INTRAVENOUS
  Administered 2016-02-04: 1 mg via INTRAVENOUS
  Administered 2016-02-04: 2 mg via INTRAVENOUS
  Administered 2016-02-04: 1 mg via INTRAVENOUS
  Administered 2016-02-05: 2 mg via INTRAVENOUS
  Administered 2016-02-05: 1 mg via INTRAVENOUS
  Administered 2016-02-05 – 2016-02-06 (×3): 2 mg via INTRAVENOUS
  Administered 2016-02-06: 1 mg via INTRAVENOUS
  Administered 2016-02-06 (×2): 2 mg via INTRAVENOUS
  Filled 2016-02-01 (×8): qty 2
  Filled 2016-02-01: qty 1
  Filled 2016-02-01 (×2): qty 2
  Filled 2016-02-01: qty 1
  Filled 2016-02-01 (×4): qty 2
  Filled 2016-02-01: qty 1
  Filled 2016-02-01 (×3): qty 2
  Filled 2016-02-01: qty 1
  Filled 2016-02-01 (×3): qty 2
  Filled 2016-02-01 (×2): qty 1
  Filled 2016-02-01: qty 2
  Filled 2016-02-01: qty 1

## 2016-02-01 NOTE — Progress Notes (Signed)
[facility] PROGRESS NOTE  Tanner Rice [id number] DOB: 06/01/1934 DOA: 01/28/2016 PCP: Mathews Argyle, MD   82/M with RA on remicaide and MTX admitted with ABd pain, found to have MRSA bacteremia Now having more low back pain, abd distension and upper abd pain  Assessment/Plan: 1. Sepsis/MRSA bacteremia: Initially presented as abdominal pain and fever and started on treatment for presumed diverticulitis. Patient's blood cultures returned positive 2 for MRSA.  -Continue Iv Vanc Day 3, repeat Blood Cx 8/23 with MRSA again -Appreciate ID consult, had a rash on upper chest recently which resolved, wonder if that caused MRSA seeding -FU MRI L and T spine due to back pain in setting of bacteremia, delayed  -scheduled for TEE on 8/25, depending on mentation -lactate reassuring  2. Ileus: -improving, NG output less, abd less distended, but KUB with persistent ileus -had a BM early am, keep NGT in situ for 1 more day, give lactulose and dulcolax again today, clamp and dc this tomorrow if improving -recent CT unremarkable -KUB 8/22 with ileus, contrast seen in colon, hence no SBO  2. Hyponatremia:  -stable, continue IVf while NPO  3. HTN:  -hold amlodipine and metoprolol -give IV BB while NPO  4. RA:  -Hold methotrexate -On Remicade  5. Left adrenal mass:  -Adrenal protocol CT as an outpatient once all acute issues resolved  DVt proph: lovenox  Code Status: FULL  Family Communication: wife at bedside Disposition Plan: keep in SDU today   Consultants:  ID  Procedures:  TEE - 02/02/16 - PENDING  Antibiotics:  Levaquin - 8/20>>>8/22  Flagyl - 8/20>>8/22  Vancomycin - 8/21 (Started by Pharmacy immediately after + [place] noted) >>  HPI/Subjective: Feels better, abd less tight, + BM  Objective: Vitals:   02/01/16 0900 02/01/16 1000  BP: 125/69 (!) 141/80  Pulse: 70 74  Resp: 11 13  Temp:      Intake/Output Summary (Last 24 hours) at 02/01/16  1119 Last data filed at 02/01/16 0957  Gross per 24 hour  Intake             1950 ml  Output             1600 ml  Net              350 ml   Filed Weights   01/28/16 1659 01/28/16 1710 01/29/16 0145  Weight: 73.5 kg (162 lb) 73.5 kg (162 lb) 75.4 kg (166 lb 3.2 oz)    Exam:   General:  Somnolent, arousable, oriented x2  Cardiovascular: Regular rate and rhythm, 2/6 systolic murmur  Respiratory: Decreased breath sounds in bases with intermittent crackles. Normal effort  Abdomen: less distended, + BS, non tender  Musculoskeletal: Normal tone bilaterally in upper and lower extremities, no bony abnormalities, moves all extremity coordinated fashion.   Ext: no edema  Data Reviewed: Basic Metabolic Panel:  Recent Labs Lab 01/28/16 1734 01/29/16 0405 01/30/16 1001 01/31/16 0623 02/01/16 0440  NA 133* 131* 128* 128* 128*  K 3.7 3.6 4.1 4.1 3.7  CL 99* 98* 100* 97* 99*  CO2 26 26 22 25 23   GLUCOSE 125* 133* 101* 115* 117*  BUN 17 13 24* 20 18  CREATININE 0.72 0.84 1.00 0.91 0.75  CALCIUM 9.4 8.9 8.6* 8.7* 8.3*   Liver Function Tests:  Recent Labs Lab 01/28/16 1734 01/29/16 0405 01/30/16 1001 02/01/16 0440  AST 27 20 20 22   ALT 20 17 15* 17  ALKPHOS 73 57 60 56  BILITOT 0.9 1.3* 1.3* 1.5*  PROT 7.1 5.8* 6.0* 4.9*  ALBUMIN 4.2 3.3* 2.9* 2.2*    Recent Labs Lab 01/28/16 1734  LIPASE 18   No results for input(s): AMMONIA in the last 168 hours. CBC:  Recent Labs Lab 01/28/16 1734 01/29/16 0405 01/30/16 1001 01/31/16 0623 02/01/16 0440  WBC 12.0* 16.9* 18.7* 13.4* 10.4  NEUTROABS 9.6*  --   --   --   --   HGB 14.0 12.7* 12.4* 12.2* 11.0*  HCT 39.8 38.0* 37.8* 37.1* 32.9*  MCV 93.4 95.0 97.2 95.6 93.7  PLT 160 141* 129* 125* 134*   Cardiac Enzymes:  Recent Labs Lab 01/28/16 1734  TROPONINI <0.03   BNP (last 3 results) No results for input(s): BNP in the last 8760 hours.  ProBNP (last 3 results) No results for input(s): PROBNP in the last  8760 hours.  CBG: No results for input(s): GLUCAP in the last 168 hours.  Recent Results (from the past 240 hour(s))  Culture, blood (routine x 2)     Status: Abnormal   Collection Time: 01/29/16  4:02 AM  Result Value Ref Range Status   Specimen Description BLOOD LEFT ANTECUBITAL  Final   Special Requests BOTTLES DRAWN AEROBIC AND ANAEROBIC 5CC   Final   Culture  Setup Time   Final    GRAM POSITIVE COCCI IN CLUSTERS IN BOTH AEROBIC AND ANAEROBIC BOTTLES Organism ID to follow CRITICAL RESULT CALLED TO, READ BACK BY AND VERIFIED WITH: M BELL 01/29/16 @ [place] (A)  Final   Report Status 01/31/2016 FINAL  Final   Organism ID, Bacteria METHICILLIN RESISTANT STAPHYLOCOCCUS AUREUS  Final      Susceptibility   Methicillin resistant staphylococcus aureus - MIC*    CIPROFLOXACIN >=8 RESISTANT Resistant     ERYTHROMYCIN >=8 RESISTANT Resistant     GENTAMICIN <=0.5 SENSITIVE Sensitive     OXACILLIN >=4 RESISTANT Resistant     TETRACYCLINE <=1 SENSITIVE Sensitive     VANCOMYCIN 1 SENSITIVE Sensitive     TRIMETH/SULFA <=10 SENSITIVE Sensitive     CLINDAMYCIN <=0.25 SENSITIVE Sensitive     RIFAMPIN <=0.5 SENSITIVE Sensitive     Inducible Clindamycin NEGATIVE Sensitive     * METHICILLIN RESISTANT STAPHYLOCOCCUS AUREUS  Blood Culture ID Panel (Reflexed)     Status: Abnormal   Collection Time: 01/29/16  4:02 AM  Result Value Ref Range Status   Enterococcus species NOT DETECTED NOT DETECTED Final   Vancomycin resistance NOT DETECTED NOT DETECTED Final   Listeria monocytogenes NOT DETECTED NOT DETECTED Final   Staphylococcus species DETECTED (A) NOT DETECTED Final    Comment: CRITICAL RESULT CALLED TO, READ BACK BY AND VERIFIED WITH: M BELL 01/29/16 @ 1929 M VESTAL    Staphylococcus aureus DETECTED (A) NOT DETECTED Final    Comment: CRITICAL RESULT CALLED TO, READ BACK BY AND VERIFIED WITH: M BELL 01/29/16 @ 1929 M VESTAL     Methicillin resistance DETECTED (A) NOT DETECTED Final    Comment: CRITICAL RESULT CALLED TO, READ BACK BY AND VERIFIED WITH: M BELL 01/29/16 @ 1929 M VESTAL    Streptococcus species NOT DETECTED NOT DETECTED Final   Streptococcus agalactiae NOT DETECTED NOT DETECTED Final   Streptococcus pneumoniae NOT DETECTED NOT DETECTED Final   Streptococcus pyogenes NOT DETECTED NOT DETECTED Final   Acinetobacter baumannii NOT DETECTED NOT DETECTED Final   Enterobacteriaceae species NOT DETECTED NOT DETECTED Final   Enterobacter cloacae complex  NOT DETECTED NOT DETECTED Final   Escherichia coli NOT DETECTED NOT DETECTED Final   Klebsiella oxytoca NOT DETECTED NOT DETECTED Final   Klebsiella pneumoniae NOT DETECTED NOT DETECTED Final   Proteus species NOT DETECTED NOT DETECTED Final   Serratia marcescens NOT DETECTED NOT DETECTED Final   Carbapenem resistance NOT DETECTED NOT DETECTED Final   Haemophilus influenzae NOT DETECTED NOT DETECTED Final   Neisseria meningitidis NOT DETECTED NOT DETECTED Final   Pseudomonas aeruginosa NOT DETECTED NOT DETECTED Final   Candida albicans NOT DETECTED NOT DETECTED Final   Candida glabrata NOT DETECTED NOT DETECTED Final   Candida krusei NOT DETECTED NOT DETECTED Final   Candida parapsilosis NOT DETECTED NOT DETECTED Final   Candida tropicalis NOT DETECTED NOT DETECTED Final  Culture, blood (routine x 2)     Status: Abnormal   Collection Time: 01/29/16  4:05 AM  Result Value Ref Range Status   Specimen Description BLOOD LEFT ANTECUBITAL  Final   Special Requests BOTTLES DRAWN AEROBIC AND ANAEROBIC 5CC   Final   Culture  Setup Time   Final    GRAM POSITIVE COCCI IN CLUSTERS IN BOTH AEROBIC AND ANAEROBIC BOTTLES CRITICAL VALUE NOTED.  VALUE IS CONSISTENT WITH PREVIOUSLY REPORTED AND CALLED VALUE.    Culture (A)  Final    STAPHYLOCOCCUS AUREUS SUSCEPTIBILITIES PERFORMED ON PREVIOUS CULTURE WITHIN THE LAST 5 DAYS.    Report Status 01/31/2016 FINAL  Final   MRSA culture     Status: None   Collection Time: 01/30/16  1:16 PM  Result Value Ref Range Status   Specimen Description NASAL SWAB  Final   Special Requests NONE  Final   Culture NEGATIVE  Final   Report Status 01/31/2016 FINAL  Final  Culture, blood (routine x 2)     Status: Abnormal (Preliminary result)   Collection Time: 01/31/16  6:20 AM  Result Value Ref Range Status   Specimen Description BLOOD LEFT ANTECUBITAL  Final   Special Requests IN PEDIATRIC BOTTLE 3CC  Final   Culture  Setup Time   Final    GRAM POSITIVE COCCI IN CLUSTERS AEROBIC BOTTLE ONLY CRITICAL RESULT CALLED TO, READ BACK BY AND VERIFIED WITH: A MYER 01/31/16 @ 2119 M VESTAL    Culture (A)  Final    STAPHYLOCOCCUS AUREUS SUSCEPTIBILITIES PERFORMED ON PREVIOUS CULTURE WITHIN THE LAST 5 DAYS.    Report Status PENDING  Incomplete  Culture, blood (routine x 2)     Status: Abnormal (Preliminary result)   Collection Time: 01/31/16  6:25 AM  Result Value Ref Range Status   Specimen Description BLOOD LEFT HAND  Final   Special Requests IN PEDIATRIC BOTTLE 1CC  Final   Culture  Setup Time   Final    GRAM POSITIVE COCCI IN CLUSTERS AEROBIC BOTTLE ONLY CRITICAL VALUE NOTED.  VALUE IS CONSISTENT WITH PREVIOUSLY REPORTED AND CALLED VALUE.    Culture (A)  Final    STAPHYLOCOCCUS AUREUS SUSCEPTIBILITIES PERFORMED ON PREVIOUS CULTURE WITHIN THE LAST 5 DAYS.    Report Status PENDING  Incomplete     Studies: Dg Abd 1 View  Result Date: 01/31/2016 CLINICAL DATA:  Abdominal pain and constipation EXAM: ABDOMEN - 1 VIEW COMPARISON:  01/30/2016 FINDINGS: Scattered large and small bowel gas is noted stable from the previous exam consistent with a generalized ileus. Fecal material is noted within the colon and stable. Degenerative changes of lumbar spine are seen. No free air is noted. IMPRESSION: Stable ileus. Electronically Signed   By: Elta Guadeloupe  Lukens M.D.   On: 01/31/2016 11:48   Dg Abd Portable 1v  Result Date:  02/01/2016 CLINICAL DATA:  Ileus. EXAM: PORTABLE ABDOMEN - 1 VIEW COMPARISON:  01/31/2016. FINDINGS: NG tube noted in stable position . Distended loops of small large bowel noted consistent adynamic ileus. No interim change prior exam. No free air. Degenerative changes lumbar spine and both hips. IMPRESSION: NG tube in stable position. Stable distended loops of small and large bowel consistent with adynamic ileus. No significant change from prior exam. Electronically Signed   By: Marcello Moores  Register   On: 02/01/2016 06:39   Dg Abd Portable 1v  Result Date: 01/31/2016 CLINICAL DATA:  Status post NG tube placement today. EXAM: PORTABLE ABDOMEN - 1 VIEW COMPARISON:  None. FINDINGS: NG tube is in place in good position with the side-port in the stomach and tip in the fundus. IMPRESSION: NG tube in good position. Electronically Signed   By: Inge Rise M.D.   On: 01/31/2016 18:35    Scheduled Meds: . bisacodyl  10 mg Rectal Once  . enoxaparin (LOVENOX) injection  40 mg Subcutaneous Q24H  . lactulose  20 g Oral TID  . metoprolol  5 mg Intravenous Q6H  . tamsulosin  0.4 mg Oral QPC breakfast  . vancomycin  1,250 mg Intravenous Q12H   Continuous Infusions: . sodium chloride 100 mL/hr (01/31/16 1531)    Principal Problem:   MRSA bacteremia Active Problems:   Rheumatoid arthritis involving multiple sites with positive rheumatoid factor (HCC)   Essential hypertension   Hyponatremia   Left adrenal mass (HCC)   Epigastric pain   Hyperglycemia   Normocytic anemia   Thrombocytopenia (HCC)   Atherosclerotic peripheral vascular disease (HCC)   Degenerative joint disease of spine   BPH (benign prostatic hyperplasia)    Time spent: 86min    Ebelin Dillehay  [phone] [facility]  If 7PM-7AM, please contact night-coverage at [email or web address], password [facility] 02/01/2016, 11:19 AM  LOS: 3 days

## 2016-02-01 NOTE — Care Management Important Message (Signed)
Important Message  Patient Details  Name: Tanner Rice MRN: [id number] Date of Birth: [birth year]   Medicare Important Message Given:  Yes    Lacretia Leigh, RN 02/01/2016, 10:37 AM

## 2016-02-01 NOTE — Progress Notes (Signed)
Patient ID: Tanner Rice, male   DOB: 1933/12/20, 82 y.o.   MRN: [id number]          [facility]    Date of Admission:  01/28/2016           Day 3 vancomycin  Principal Problem:   MRSA bacteremia Active Problems:   Epigastric pain   Rheumatoid arthritis involving multiple sites with positive rheumatoid factor (HCC)   Essential hypertension   Hyponatremia   Left adrenal mass (HCC)   Hyperglycemia   Normocytic anemia   Thrombocytopenia (HCC)   Atherosclerotic peripheral vascular disease (HCC)   Degenerative joint disease of spine   BPH (benign prostatic hyperplasia)   . enoxaparin (LOVENOX) injection  40 mg Subcutaneous Q24H  . lactulose  20 g Oral TID  . metoprolol  5 mg Intravenous Q6H  . tamsulosin  0.4 mg Oral QPC breakfast  . vancomycin  1,250 mg Intravenous Q12H    SUBJECTIVE: He continues to have 10 out of 10 pain wrapping around his lower rib cage.  Review of Systems: Review of Systems  Constitutional: Positive for malaise/fatigue. Negative for chills, diaphoresis and fever.  Respiratory: Positive for cough and sputum production.   Cardiovascular: Positive for chest pain.  Gastrointestinal: Positive for abdominal pain and nausea. Negative for constipation, diarrhea and vomiting.    Past Medical History:  Diagnosis Date  . Hypertension   . Melanoma ([place])   . Rheumatoid arthritis(714.0)     Social History  Substance Use Topics  . Smoking status: Former Research scientist (life sciences)  . Smokeless tobacco: Never Used  . Alcohol use Yes     Comment: glass of wine a day     Family History  Problem Relation Age of Onset  . Prostate cancer Father   . Prostate cancer Brother   . Osteoarthritis Brother    Allergies  Allergen Reactions  . Other Diarrhea, Nausea And Vomiting and Other (See Comments)    Seafood - muscles   . Penicillins Cross Reactors Hives    OBJECTIVE: Vitals:   02/01/16 0800 02/01/16 0900 02/01/16 1000 02/01/16 1100  BP: (!)  142/69 125/69 (!) 141/80 (!) 184/100  Pulse: 70 70 74 96  Resp: 14 11 13 18   Temp:    99.7 F (37.6 C)  TempSrc:    Oral  SpO2: 97% 98% 98% 96%  Weight:      Height:       Body mass index is 26.83 kg/m.  Physical Exam  Constitutional:  He looks fairly miserable. His wife is at the bedside.  Cardiovascular: Normal rate and regular rhythm.   No murmur heard. Pulmonary/Chest: Effort normal and breath sounds normal.  Abdominal: Soft. He exhibits distension. There is no tenderness.  Neurological: He is alert.  Skin: No rash noted.    Lab Results Lab Results  Component Value Date   WBC 10.4 02/01/2016   HGB 11.0 (L) 02/01/2016   HCT 32.9 (L) 02/01/2016   MCV 93.7 02/01/2016   PLT 134 (L) 02/01/2016    Lab Results  Component Value Date   CREATININE 0.75 02/01/2016   BUN 18 02/01/2016   NA 128 (L) 02/01/2016   K 3.7 02/01/2016   CL 99 (L) 02/01/2016   CO2 23 02/01/2016    Lab Results  Component Value Date   ALT 17 02/01/2016   AST 22 02/01/2016   ALKPHOS 56 02/01/2016   BILITOT 1.5 (H) 02/01/2016     Microbiology: Recent Results (from the  past 240 hour(s))  Culture, blood (routine x 2)     Status: Abnormal   Collection Time: 01/29/16  4:02 AM  Result Value Ref Range Status   Specimen Description BLOOD LEFT ANTECUBITAL  Final   Special Requests BOTTLES DRAWN AEROBIC AND ANAEROBIC 5CC   Final   Culture  Setup Time   Final    GRAM POSITIVE COCCI IN CLUSTERS IN BOTH AEROBIC AND ANAEROBIC BOTTLES Organism ID to follow CRITICAL RESULT CALLED TO, READ BACK BY AND VERIFIED WITH: M BELL 01/29/16 @ [place] (A)  Final   Report Status 01/31/2016 FINAL  Final   Organism ID, Bacteria METHICILLIN RESISTANT STAPHYLOCOCCUS AUREUS  Final      Susceptibility   Methicillin resistant staphylococcus aureus - MIC*    CIPROFLOXACIN >=8 RESISTANT Resistant     ERYTHROMYCIN >=8 RESISTANT Resistant     GENTAMICIN <=0.5  SENSITIVE Sensitive     OXACILLIN >=4 RESISTANT Resistant     TETRACYCLINE <=1 SENSITIVE Sensitive     VANCOMYCIN 1 SENSITIVE Sensitive     TRIMETH/SULFA <=10 SENSITIVE Sensitive     CLINDAMYCIN <=0.25 SENSITIVE Sensitive     RIFAMPIN <=0.5 SENSITIVE Sensitive     Inducible Clindamycin NEGATIVE Sensitive     * METHICILLIN RESISTANT STAPHYLOCOCCUS AUREUS  Blood Culture ID Panel (Reflexed)     Status: Abnormal   Collection Time: 01/29/16  4:02 AM  Result Value Ref Range Status   Enterococcus species NOT DETECTED NOT DETECTED Final   Vancomycin resistance NOT DETECTED NOT DETECTED Final   Listeria monocytogenes NOT DETECTED NOT DETECTED Final   Staphylococcus species DETECTED (A) NOT DETECTED Final    Comment: CRITICAL RESULT CALLED TO, READ BACK BY AND VERIFIED WITH: M BELL 01/29/16 @ 1929 M VESTAL    Staphylococcus aureus DETECTED (A) NOT DETECTED Final    Comment: CRITICAL RESULT CALLED TO, READ BACK BY AND VERIFIED WITH: M BELL 01/29/16 @ 1929 M VESTAL    Methicillin resistance DETECTED (A) NOT DETECTED Final    Comment: CRITICAL RESULT CALLED TO, READ BACK BY AND VERIFIED WITH: M BELL 01/29/16 @ 1929 M VESTAL    Streptococcus species NOT DETECTED NOT DETECTED Final   Streptococcus agalactiae NOT DETECTED NOT DETECTED Final   Streptococcus pneumoniae NOT DETECTED NOT DETECTED Final   Streptococcus pyogenes NOT DETECTED NOT DETECTED Final   Acinetobacter baumannii NOT DETECTED NOT DETECTED Final   Enterobacteriaceae species NOT DETECTED NOT DETECTED Final   Enterobacter cloacae complex NOT DETECTED NOT DETECTED Final   Escherichia coli NOT DETECTED NOT DETECTED Final   Klebsiella oxytoca NOT DETECTED NOT DETECTED Final   Klebsiella pneumoniae NOT DETECTED NOT DETECTED Final   Proteus species NOT DETECTED NOT DETECTED Final   Serratia marcescens NOT DETECTED NOT DETECTED Final   Carbapenem resistance NOT DETECTED NOT DETECTED Final   Haemophilus influenzae NOT DETECTED NOT  DETECTED Final   Neisseria meningitidis NOT DETECTED NOT DETECTED Final   Pseudomonas aeruginosa NOT DETECTED NOT DETECTED Final   Candida albicans NOT DETECTED NOT DETECTED Final   Candida glabrata NOT DETECTED NOT DETECTED Final   Candida krusei NOT DETECTED NOT DETECTED Final   Candida parapsilosis NOT DETECTED NOT DETECTED Final   Candida tropicalis NOT DETECTED NOT DETECTED Final  Culture, blood (routine x 2)     Status: Abnormal   Collection Time: 01/29/16  4:05 AM  Result Value Ref Range Status   Specimen Description BLOOD LEFT ANTECUBITAL  Final  Special Requests BOTTLES DRAWN AEROBIC AND ANAEROBIC 5CC   Final   Culture  Setup Time   Final    GRAM POSITIVE COCCI IN CLUSTERS IN BOTH AEROBIC AND ANAEROBIC BOTTLES CRITICAL VALUE NOTED.  VALUE IS CONSISTENT WITH PREVIOUSLY REPORTED AND CALLED VALUE.    Culture (A)  Final    STAPHYLOCOCCUS AUREUS SUSCEPTIBILITIES PERFORMED ON PREVIOUS CULTURE WITHIN THE LAST 5 DAYS.    Report Status 01/31/2016 FINAL  Final  MRSA culture     Status: None   Collection Time: 01/30/16  1:16 PM  Result Value Ref Range Status   Specimen Description NASAL SWAB  Final   Special Requests NONE  Final   Culture NEGATIVE  Final   Report Status 01/31/2016 FINAL  Final  Culture, blood (routine x 2)     Status: Abnormal (Preliminary result)   Collection Time: 01/31/16  6:20 AM  Result Value Ref Range Status   Specimen Description BLOOD LEFT ANTECUBITAL  Final   Special Requests IN PEDIATRIC BOTTLE 3CC  Final   Culture  Setup Time   Final    GRAM POSITIVE COCCI IN CLUSTERS AEROBIC BOTTLE ONLY CRITICAL RESULT CALLED TO, READ BACK BY AND VERIFIED WITH: A MYER 01/31/16 @ 2119 M VESTAL    Culture (A)  Final    STAPHYLOCOCCUS AUREUS SUSCEPTIBILITIES PERFORMED ON PREVIOUS CULTURE WITHIN THE LAST 5 DAYS.    Report Status PENDING  Incomplete  Culture, blood (routine x 2)     Status: Abnormal (Preliminary result)   Collection Time: 01/31/16  6:25 AM  Result  Value Ref Range Status   Specimen Description BLOOD LEFT HAND  Final   Special Requests IN PEDIATRIC BOTTLE 1CC  Final   Culture  Setup Time   Final    GRAM POSITIVE COCCI IN CLUSTERS AEROBIC BOTTLE ONLY CRITICAL VALUE NOTED.  VALUE IS CONSISTENT WITH PREVIOUSLY REPORTED AND CALLED VALUE.    Culture (A)  Final    STAPHYLOCOCCUS AUREUS SUSCEPTIBILITIES PERFORMED ON PREVIOUS CULTURE WITHIN THE LAST 5 DAYS.    Report Status PENDING  Incomplete     ASSESSMENT: Repeat blood cultures are growing MRSA again. I have ordered 2 more sets today. I will continue vancomycin. He thinks he may be able to tolerate MRI of his thoracic and lumbar spine today but is worried about his pain. He is scheduled for TEE tomorrow.  PLAN: 1. Continue vancomycin 2. Repeat blood cultures 3. MRI of thoracic and lumbar spine and possible 4. TEE tomorrow  Michel Bickers, MD [facility] [phone] pager   [phone] cell 02/01/2016, 12:11 PM

## 2016-02-02 ENCOUNTER — Encounter (HOSPITAL_COMMUNITY): Payer: Self-pay | Admitting: *Deleted

## 2016-02-02 ENCOUNTER — Encounter (HOSPITAL_COMMUNITY)
Admission: EM | Disposition: A | Discharge status: Home Health | Payer: Self-pay | Source: Home / Self Care | Attending: Internal Medicine

## 2016-02-02 ENCOUNTER — Inpatient Hospital Stay (HOSPITAL_COMMUNITY): Payer: Medicare Other

## 2016-02-02 DIAGNOSIS — I340 Nonrheumatic mitral (valve) insufficiency: Secondary | ICD-10-CM

## 2016-02-02 HISTORY — PX: TEE WITHOUT CARDIOVERSION: SHX5443

## 2016-02-02 LAB — ECHO TEE
Reg peak vel: 270 cm/s
TR max vel: 270 cm/s

## 2016-02-02 LAB — BASIC METABOLIC PANEL
ANION GAP: 7 (ref 5–15)
BUN: 13 mg/dL (ref 6–20)
CHLORIDE: 102 mmol/L (ref 101–111)
CO2: 25 mmol/L (ref 22–32)
Calcium: 8.5 mg/dL — ABNORMAL LOW (ref 8.9–10.3)
Creatinine, Ser: 0.63 mg/dL (ref 0.61–1.24)
GFR calc Af Amer: >60 mL/min (ref >60)
GFR calc non Af Amer: >60 mL/min (ref >60)
GLUCOSE: 93 mg/dL (ref 65–99)
POTASSIUM: 3.3 mmol/L — AB (ref 3.5–5.1)
Sodium: 134 mmol/L — ABNORMAL LOW (ref 135–145)

## 2016-02-02 LAB — CULTURE, BLOOD (ROUTINE X 2)

## 2016-02-02 LAB — CBC
HCT: 35.6 % — ABNORMAL LOW (ref 39.0–52.0)
HEMOGLOBIN: 11.7 g/dL — AB (ref 13.0–17.0)
MCH: 31.4 pg (ref 26.0–34.0)
MCHC: 32.9 g/dL (ref 30.0–36.0)
MCV: 95.4 fL (ref 78.0–100.0)
Platelets: 130 10*3/uL — ABNORMAL LOW (ref 150–400)
RBC: 3.73 MIL/uL — AB (ref 4.22–5.81)
RDW: 13.1 % (ref 11.5–15.5)
WBC: 7.2 10*3/uL (ref 4.0–10.5)

## 2016-02-02 SURGERY — ECHOCARDIOGRAM, TRANSESOPHAGEAL
Anesthesia: Moderate Sedation

## 2016-02-02 MED ORDER — BUTAMBEN-TETRACAINE-BENZOCAINE 2-2-14 % EX AERO
INHALATION_SPRAY | CUTANEOUS | Status: DC | PRN
  Administered 2016-02-02: 1 via TOPICAL

## 2016-02-02 MED ORDER — POLYETHYLENE GLYCOL 3350 17 G PO PACK
17.0000 g | PACK | Freq: Every day | ORAL | Status: DC
  Administered 2016-02-02 – 2016-02-04 (×3): 17 g via ORAL
  Filled 2016-02-02 (×3): qty 1

## 2016-02-02 MED ORDER — GADOBENATE DIMEGLUMINE 529 MG/ML IV SOLN
15.0000 mL | Freq: Once | INTRAVENOUS | Status: AC | PRN
  Administered 2016-02-02: 15 mL via INTRAVENOUS

## 2016-02-02 MED ORDER — LORAZEPAM 2 MG/ML IJ SOLN: 1.0000 mg | Freq: Once | INTRAMUSCULAR | Status: DC

## 2016-02-02 MED ORDER — POTASSIUM CHLORIDE 10 MEQ/100ML IV SOLN
10.0000 meq | INTRAVENOUS | Status: AC
  Administered 2016-02-02 (×4): 10 meq via INTRAVENOUS
  Filled 2016-02-02 (×2): qty 100

## 2016-02-02 MED ORDER — LABETALOL HCL 5 MG/ML IV SOLN
10.0000 mg | INTRAVENOUS | Status: DC | PRN
  Administered 2016-02-04: 10 mg via INTRAVENOUS
  Filled 2016-02-02 (×2): qty 4

## 2016-02-02 MED ORDER — FENTANYL CITRATE (PF) 100 MCG/2ML IJ SOLN
INTRAMUSCULAR | Status: AC
  Filled 2016-02-02: qty 2

## 2016-02-02 MED ORDER — MIDAZOLAM HCL 5 MG/ML IJ SOLN
INTRAMUSCULAR | Status: AC
  Filled 2016-02-02: qty 2

## 2016-02-02 MED ORDER — BISACODYL 10 MG RE SUPP
10.0000 mg | Freq: Once | RECTAL | Status: AC
  Administered 2016-02-02: 10 mg via RECTAL
  Filled 2016-02-02: qty 1

## 2016-02-02 NOTE — Progress Notes (Signed)
Received call from central cardiac monitoring technician  that pt was tachycardic and appeared to be in A fib. 12 lead performed and showed a rhythm of SVT 150s. Pt denies SOB and CP at this time. On call for admitting team made aware of pt's change in condition and awaiting return call from Cardiology on call at this time. Will continue to monitor.

## 2016-02-02 NOTE — CV Procedure (Signed)
    TEE  Indications: bacteremia  No vegetations Mild MR/TR/PR Trace AI Normal EF  Reassuring  Candee Furbish, MD

## 2016-02-02 NOTE — Consult Note (Signed)
Subjective: Patient is a 82 y.o. male who complains of back pain with radiation around his flanks to the abdomen. Onset of symptoms was 5 days ago, gradually worsening since that time.  Onset was not related to no known injury. The pain is rated severe, and is located at the across the mid back and radiates around the abdomen. The pain is described as aching and occurs all day. The symptoms have not been progressive. Symptoms are exacerbated by nothing in particular. The patient has tried pain medications. MRI showed T8-9 discitis and osteomyelitis with small epidural abscess and neurosurgical consultation was requested. He is on vancomycin. Infectious disease is following. He was admitted with abdominal pain and blood cultures were positive for MRSA. He has been walking well and walking this afternoon. He denies symptoms in the legs. He denies numbness tingling or weakness or bowel or bladder changes.   Past Medical History:  Diagnosis Date  . Hypertension   . Melanoma ([place])   . Rheumatoid arthritis(714.0)     Past Surgical History:  Procedure Laterality Date  . hole in retina surgery    . TONSILLECTOMY      Allergies  Allergen Reactions  . Other Diarrhea, Nausea And Vomiting and Other (See Comments)    Seafood - muscles   . Penicillins Cross Reactors Hives    Social History  Substance Use Topics  . Smoking status: Former Research scientist (life sciences)  . Smokeless tobacco: Never Used  . Alcohol use Yes     Comment: glass of wine a day     Family History  Problem Relation Age of Onset  . Prostate cancer Father   . Prostate cancer Brother   . Osteoarthritis Brother    Prior to Admission medications   Medication Sig Start Date End Date Taking? Authorizing Provider  amLODipine (NORVASC) 2.5 MG tablet Take 2.5 mg by mouth daily.     Yes Historical Provider, MD  aspirin EC 81 MG tablet Take 81 mg by mouth daily.   Yes Historical Provider, MD  betamethasone dipropionate (DIPROLENE) 0.05 % cream Apply 1  application topically 2 (two) times daily. 01/18/16  Yes Historical Provider, MD  calcium citrate-vitamin D (CITRACAL+D) 315-200 MG-UNIT tablet Take 1 tablet by mouth 2 (two) times a week.   Yes Historical Provider, MD  cyanocobalamin 500 MCG tablet Take 500 mcg by mouth daily.     Yes Historical Provider, MD  glucosamine-chondroitin 500-400 MG tablet Take 1 tablet by mouth daily.   Yes Historical Provider, MD  inFLIXimab (REMICADE) 100 MG injection Inject into the vein every 8 (eight) weeks.     Yes Historical Provider, MD  lisinopril (PRINIVIL,ZESTRIL) 10 MG tablet Take 10 mg by mouth daily.     Yes Historical Provider, MD  Melatonin 3 MG TABS Take 3 mg by mouth at bedtime.    Yes Historical Provider, MD  methotrexate (RHEUMATREX) 2.5 MG tablet Take 20 mg by mouth every 7 (seven) days. Take on Monday    Yes Historical Provider, MD  metoprolol succinate (TOPROL-XL) 25 MG 24 hr tablet Take 25 mg by mouth daily.    Yes Historical Provider, MD  minocycline (MINOCIN,DYNACIN) 100 MG capsule Take 100 mg by mouth daily as needed (for flare ups).  06/23/15  Yes Historical Provider, MD  Multiple Vitamin (MULTIVITAMIN) tablet Take 1 tablet by mouth daily.     Yes Historical Provider, MD  tamsulosin (FLOMAX) 0.4 MG CAPS capsule Take 0.4 mg by mouth daily after supper.   Yes Historical Provider,  MD     Review of Systems  Positive ROS: neg  All other systems have been reviewed and were otherwise negative with the exception of those mentioned in the HPI and as above.  Objective: Vital signs in last 24 hours: Temp:  [96.8 F (36 C)-99.9 F (37.7 C)] 99.9 F (37.7 C) (08/25 2053) Pulse Rate:  [62-155] 155 (08/25 2030) Resp:  [6-25] 20 (08/25 2030) BP: (131-203)/(70-106) 131/73 (08/25 2030) SpO2:  [91 %-99 %] 93 % (08/25 2030)  General Appearance: Alert, cooperative, no distress, appears stated age Head: Normocephalic, without obvious abnormality, atraumatic Eyes: PERRL, conjunctiva/corneas clear,  EOM's intact     Ears: Normal TM's and external ear canals, both ears Throat: Lips, mucosa, and tongue normal Neck: Supple, symmetrical, trachea midline Back: Symmetric, no curvature, ROM normal, no CVA tenderness Lungs:  respirations unlabored Heart: Regular rate and rhythm Abdomen: Soft Extremities: Extremities normal, atraumatic, no cyanosis or edema Pulses: 2+ and symmetric all extremities Skin: Skin color, texture, turgor normal, no rashes or lesions  NEUROLOGIC:   Mental status: alert and oriented, no aphasia, good attention span, Fund of knowledge/ memory ok Motor Exam - grossly normal with good strength in all muscle groups of the upper and lower extremities Sensory Exam - grossly normal to light touch Reflexes: Symmetric and 1+ without clonus Coordination - grossly normal Gait - not tested Balance - not tested Cranial Nerves: I: smell Not tested  II: visual acuity  OS: na    OD: na  II: visual fields Full to confrontation  II: pupils Equal, round, reactive to light  III,VII: ptosis None  III,IV,VI: extraocular muscles  Full ROM  V: mastication Normal  V: facial light touch sensation  Normal  V,VII: corneal reflex  Present  VII: facial muscle function - upper  Normal  VII: facial muscle function - lower Normal  VIII: hearing Not tested  IX: soft palate elevation  Normal  IX,X: gag reflex Present  XI: trapezius strength  5/5  XI: sternocleidomastoid strength 5/5  XI: neck flexion strength  5/5  XII: tongue strength  Normal    Data Review Lab Results  Component Value Date   WBC 7.2 02/02/2016   HGB 11.7 (L) 02/02/2016   HCT 35.6 (L) 02/02/2016   MCV 95.4 02/02/2016   PLT 130 (L) 02/02/2016   Lab Results  Component Value Date   NA 134 (L) 02/02/2016   K 3.3 (L) 02/02/2016   CL 102 02/02/2016   CO2 25 02/02/2016   BUN 13 02/02/2016   CREATININE 0.63 02/02/2016   GLUCOSE 93 02/02/2016   No results found for: INR, PROTIME   MRI of thoracic spine  reviewed and I agree with the report. There is a small epidural abscess ventral to the cord and there is mild to moderate canal stenosis but no overt cord compression or signal change within the cord. I suspect this is more of a phlegmon that an abscess since I do not see Central fluid  Assessment/Plan: T8-9 discitis and osteomyelitis with small ventral epidural abscess with mild to moderate canal narrowing, normal neurologic exam, being treated with culture directed antibiotics.  At this point I do not think that surgery is absolutely warranted. It may actually carry more risk than potential benefits given his normal neurologic exam and ventral location of the phlegmon. While I could decompress the cord from a posterior approach could not likely do much to decompress the ventral part of the cord or decrease the risk of anterior  spinal artery syndrome, and we already have a pathogen. Again, surgical decompression may carry more risk than potential benefits at this point. I will follow along and see him again tomorrow. I suspect he will need at least 6 weeks of IV antibiotics followed by 6 weeks of oral antibiotics.   Jerzie Bieri S 02/02/2016 10:52 PM

## 2016-02-02 NOTE — Progress Notes (Signed)
Pt back in NSR rate of 80s at this time. Still awaiting call from Cardiology on call to notify of SVT event. Will continue to monitor.

## 2016-02-02 NOTE — Progress Notes (Signed)
  Echocardiogram Echocardiogram Transesophageal has been performed.  Aggie Cosier 02/02/2016, 8:35 AM

## 2016-02-02 NOTE — H&P (View-Only) (Signed)
Patient ID: Tanner Rice, male   DOB: 07/14/1933, 82 y.o.   MRN: [id number]          [facility]    Date of Admission:  01/28/2016           Day 3 vancomycin  Principal Problem:   MRSA bacteremia Active Problems:   Epigastric pain   Rheumatoid arthritis involving multiple sites with positive rheumatoid factor (HCC)   Essential hypertension   Hyponatremia   Left adrenal mass (HCC)   Hyperglycemia   Normocytic anemia   Thrombocytopenia (HCC)   Atherosclerotic peripheral vascular disease (HCC)   Degenerative joint disease of spine   BPH (benign prostatic hyperplasia)   . enoxaparin (LOVENOX) injection  40 mg Subcutaneous Q24H  . lactulose  20 g Oral TID  . metoprolol  5 mg Intravenous Q6H  . tamsulosin  0.4 mg Oral QPC breakfast  . vancomycin  1,250 mg Intravenous Q12H    SUBJECTIVE: He continues to have 10 out of 10 pain wrapping around his lower rib cage.  Review of Systems: Review of Systems  Constitutional: Positive for malaise/fatigue. Negative for chills, diaphoresis and fever.  Respiratory: Positive for cough and sputum production.   Cardiovascular: Positive for chest pain.  Gastrointestinal: Positive for abdominal pain and nausea. Negative for constipation, diarrhea and vomiting.    Past Medical History:  Diagnosis Date  . Hypertension   . Melanoma ([place])   . Rheumatoid arthritis(714.0)     Social History  Substance Use Topics  . Smoking status: Former Research scientist (life sciences)  . Smokeless tobacco: Never Used  . Alcohol use Yes     Comment: glass of wine a day     Family History  Problem Relation Age of Onset  . Prostate cancer Father   . Prostate cancer Brother   . Osteoarthritis Brother    Allergies  Allergen Reactions  . Other Diarrhea, Nausea And Vomiting and Other (See Comments)    Seafood - muscles   . Penicillins Cross Reactors Hives    OBJECTIVE: Vitals:   02/01/16 0800 02/01/16 0900 02/01/16 1000 02/01/16 1100  BP: (!)  142/69 125/69 (!) 141/80 (!) 184/100  Pulse: 70 70 74 96  Resp: 14 11 13 18   Temp:    99.7 F (37.6 C)  TempSrc:    Oral  SpO2: 97% 98% 98% 96%  Weight:      Height:       Body mass index is 26.83 kg/m.  Physical Exam  Constitutional:  He looks fairly miserable. His wife is at the bedside.  Cardiovascular: Normal rate and regular rhythm.   No murmur heard. Pulmonary/Chest: Effort normal and breath sounds normal.  Abdominal: Soft. He exhibits distension. There is no tenderness.  Neurological: He is alert.  Skin: No rash noted.    Lab Results Lab Results  Component Value Date   WBC 10.4 02/01/2016   HGB 11.0 (L) 02/01/2016   HCT 32.9 (L) 02/01/2016   MCV 93.7 02/01/2016   PLT 134 (L) 02/01/2016    Lab Results  Component Value Date   CREATININE 0.75 02/01/2016   BUN 18 02/01/2016   NA 128 (L) 02/01/2016   K 3.7 02/01/2016   CL 99 (L) 02/01/2016   CO2 23 02/01/2016    Lab Results  Component Value Date   ALT 17 02/01/2016   AST 22 02/01/2016   ALKPHOS 56 02/01/2016   BILITOT 1.5 (H) 02/01/2016     Microbiology: Recent Results (from the  past 240 hour(s))  Culture, blood (routine x 2)     Status: Abnormal   Collection Time: 01/29/16  4:02 AM  Result Value Ref Range Status   Specimen Description BLOOD LEFT ANTECUBITAL  Final   Special Requests BOTTLES DRAWN AEROBIC AND ANAEROBIC 5CC   Final   Culture  Setup Time   Final    GRAM POSITIVE COCCI IN CLUSTERS IN BOTH AEROBIC AND ANAEROBIC BOTTLES Organism ID to follow CRITICAL RESULT CALLED TO, READ BACK BY AND VERIFIED WITH: M BELL 01/29/16 @ [place] (A)  Final   Report Status 01/31/2016 FINAL  Final   Organism ID, Bacteria METHICILLIN RESISTANT STAPHYLOCOCCUS AUREUS  Final      Susceptibility   Methicillin resistant staphylococcus aureus - MIC*    CIPROFLOXACIN >=8 RESISTANT Resistant     ERYTHROMYCIN >=8 RESISTANT Resistant     GENTAMICIN <=0.5  SENSITIVE Sensitive     OXACILLIN >=4 RESISTANT Resistant     TETRACYCLINE <=1 SENSITIVE Sensitive     VANCOMYCIN 1 SENSITIVE Sensitive     TRIMETH/SULFA <=10 SENSITIVE Sensitive     CLINDAMYCIN <=0.25 SENSITIVE Sensitive     RIFAMPIN <=0.5 SENSITIVE Sensitive     Inducible Clindamycin NEGATIVE Sensitive     * METHICILLIN RESISTANT STAPHYLOCOCCUS AUREUS  Blood Culture ID Panel (Reflexed)     Status: Abnormal   Collection Time: 01/29/16  4:02 AM  Result Value Ref Range Status   Enterococcus species NOT DETECTED NOT DETECTED Final   Vancomycin resistance NOT DETECTED NOT DETECTED Final   Listeria monocytogenes NOT DETECTED NOT DETECTED Final   Staphylococcus species DETECTED (A) NOT DETECTED Final    Comment: CRITICAL RESULT CALLED TO, READ BACK BY AND VERIFIED WITH: M BELL 01/29/16 @ 1929 M VESTAL    Staphylococcus aureus DETECTED (A) NOT DETECTED Final    Comment: CRITICAL RESULT CALLED TO, READ BACK BY AND VERIFIED WITH: M BELL 01/29/16 @ 1929 M VESTAL    Methicillin resistance DETECTED (A) NOT DETECTED Final    Comment: CRITICAL RESULT CALLED TO, READ BACK BY AND VERIFIED WITH: M BELL 01/29/16 @ 1929 M VESTAL    Streptococcus species NOT DETECTED NOT DETECTED Final   Streptococcus agalactiae NOT DETECTED NOT DETECTED Final   Streptococcus pneumoniae NOT DETECTED NOT DETECTED Final   Streptococcus pyogenes NOT DETECTED NOT DETECTED Final   Acinetobacter baumannii NOT DETECTED NOT DETECTED Final   Enterobacteriaceae species NOT DETECTED NOT DETECTED Final   Enterobacter cloacae complex NOT DETECTED NOT DETECTED Final   Escherichia coli NOT DETECTED NOT DETECTED Final   Klebsiella oxytoca NOT DETECTED NOT DETECTED Final   Klebsiella pneumoniae NOT DETECTED NOT DETECTED Final   Proteus species NOT DETECTED NOT DETECTED Final   Serratia marcescens NOT DETECTED NOT DETECTED Final   Carbapenem resistance NOT DETECTED NOT DETECTED Final   Haemophilus influenzae NOT DETECTED NOT  DETECTED Final   Neisseria meningitidis NOT DETECTED NOT DETECTED Final   Pseudomonas aeruginosa NOT DETECTED NOT DETECTED Final   Candida albicans NOT DETECTED NOT DETECTED Final   Candida glabrata NOT DETECTED NOT DETECTED Final   Candida krusei NOT DETECTED NOT DETECTED Final   Candida parapsilosis NOT DETECTED NOT DETECTED Final   Candida tropicalis NOT DETECTED NOT DETECTED Final  Culture, blood (routine x 2)     Status: Abnormal   Collection Time: 01/29/16  4:05 AM  Result Value Ref Range Status   Specimen Description BLOOD LEFT ANTECUBITAL  Final  Special Requests BOTTLES DRAWN AEROBIC AND ANAEROBIC 5CC   Final   Culture  Setup Time   Final    GRAM POSITIVE COCCI IN CLUSTERS IN BOTH AEROBIC AND ANAEROBIC BOTTLES CRITICAL VALUE NOTED.  VALUE IS CONSISTENT WITH PREVIOUSLY REPORTED AND CALLED VALUE.    Culture (A)  Final    STAPHYLOCOCCUS AUREUS SUSCEPTIBILITIES PERFORMED ON PREVIOUS CULTURE WITHIN THE LAST 5 DAYS.    Report Status 01/31/2016 FINAL  Final  MRSA culture     Status: None   Collection Time: 01/30/16  1:16 PM  Result Value Ref Range Status   Specimen Description NASAL SWAB  Final   Special Requests NONE  Final   Culture NEGATIVE  Final   Report Status 01/31/2016 FINAL  Final  Culture, blood (routine x 2)     Status: Abnormal (Preliminary result)   Collection Time: 01/31/16  6:20 AM  Result Value Ref Range Status   Specimen Description BLOOD LEFT ANTECUBITAL  Final   Special Requests IN PEDIATRIC BOTTLE 3CC  Final   Culture  Setup Time   Final    GRAM POSITIVE COCCI IN CLUSTERS AEROBIC BOTTLE ONLY CRITICAL RESULT CALLED TO, READ BACK BY AND VERIFIED WITH: A MYER 01/31/16 @ 2119 M VESTAL    Culture (A)  Final    STAPHYLOCOCCUS AUREUS SUSCEPTIBILITIES PERFORMED ON PREVIOUS CULTURE WITHIN THE LAST 5 DAYS.    Report Status PENDING  Incomplete  Culture, blood (routine x 2)     Status: Abnormal (Preliminary result)   Collection Time: 01/31/16  6:25 AM  Result  Value Ref Range Status   Specimen Description BLOOD LEFT HAND  Final   Special Requests IN PEDIATRIC BOTTLE 1CC  Final   Culture  Setup Time   Final    GRAM POSITIVE COCCI IN CLUSTERS AEROBIC BOTTLE ONLY CRITICAL VALUE NOTED.  VALUE IS CONSISTENT WITH PREVIOUSLY REPORTED AND CALLED VALUE.    Culture (A)  Final    STAPHYLOCOCCUS AUREUS SUSCEPTIBILITIES PERFORMED ON PREVIOUS CULTURE WITHIN THE LAST 5 DAYS.    Report Status PENDING  Incomplete     ASSESSMENT: Repeat blood cultures are growing MRSA again. I have ordered 2 more sets today. I will continue vancomycin. He thinks he may be able to tolerate MRI of his thoracic and lumbar spine today but is worried about his pain. He is scheduled for TEE tomorrow.  PLAN: 1. Continue vancomycin 2. Repeat blood cultures 3. MRI of thoracic and lumbar spine and possible 4. TEE tomorrow  Michel Bickers, MD [facility] [phone] pager   [phone] cell 02/01/2016, 12:11 PM

## 2016-02-02 NOTE — Progress Notes (Signed)
Pt. Pulled out NG tube on accident at about 0500.  Has not had any output over night.  Pt. Going for TEE today.  [facility] called and said not to put back in at this time.  Will wait for morning rounding.

## 2016-02-02 NOTE — Progress Notes (Signed)
Upon wheeling pt back to cardiology room, pt was very drowsy; arousable to voice, but easily falls back to sleep very quickly.  Discussed patient's status with Dr. Marlou Porch.  It was decided that pt did not need sedation at this time.  Proceeded with TEE.

## 2016-02-02 NOTE — Interval H&P Note (Signed)
History and Physical Interval Note:  02/02/2016 7:53 AM  Tanner Rice  has presented today for surgery, with the diagnosis of bacteremia  The various methods of treatment have been discussed with the patient and family. After consideration of risks, benefits and other options for treatment, the patient has consented to  Procedure(s): TRANSESOPHAGEAL ECHOCARDIOGRAM (TEE) (N/A) as a surgical intervention .  The patient's history has been reviewed, patient examined, no change in status, stable for surgery.  I have reviewed the patient's chart and labs.  Questions were answered to the patient's satisfaction.     [employer]

## 2016-02-02 NOTE — Progress Notes (Signed)
[facility] PROGRESS NOTE  Tanner Rice [id number] DOB: [birth year] DOA: 01/28/2016 PCP: Mathews Argyle, MD   82/M with RA on remicaide and MTX admitted with ABd pain, found to have MRSA bacteremia Now having more low back pain, abd distension and upper abd pain  Assessment/Plan: 1. Sepsis/MRSA bacteremia: Initially presented as abdominal pain and fever and started on treatment for presumed diverticulitis. Patient's blood cultures returned positive 2 for MRSA.  -Continue Iv Vanc Day 4, repeat Blood Cx 8/23 with MRSA again -repeat Blood cultures from 8/24 pending -Appreciate ID consult, had a rash on upper chest recently which resolved, wonder if that caused MRSA seeding -FU MRI L and T spine due to back pain in setting of bacteremia, delayed again, hopefully can get done today -TEE today negative for endocarditis  2. Ileus: -improving, NG output less, abd less distended, but KUB with persistent ileus -had a BM yesterday am, NG pulled out early am, will start trial of clears and add laxatives and monitor -recent CT unremarkable -KUB 8/22 with ileus, contrast seen in colon, hence no SBO  2. Hyponatremia:  -stable, continue IVf while NPO  3. HTN:  -hold amlodipine and metoprolol -give IV BB while NPO  4. RA:  -Hold methotrexate -On Remicade  5. Left adrenal mass:  -Adrenal protocol CT as an outpatient once all acute issues resolved  DVt proph: lovenox  Code Status: FULL  Family Communication: wife at bedside Disposition Plan: keep in SDU today, Tx to Tele tomorrow   Consultants:  ID  Procedures:  TEE - 02/02/16 - PENDING  Antibiotics:  Levaquin - 8/20>>>8/22  Flagyl - 8/20>>8/22  Vancomycin - 8/21 (Started by Pharmacy immediately after + [place] noted) >>  HPI/Subjective: Some abd discomfort, but improving, NGT pulled out overnight  Objective: Vitals:   02/02/16 1022 02/02/16 1210  BP: (!) 177/92 (!) 169/91  Pulse: 100 87  Resp: 10 13   Temp: (!) 96.8 F (36 C) 98.8 F (37.1 C)    Intake/Output Summary (Last 24 hours) at 02/02/16 1358 Last data filed at 02/02/16 1200  Gross per 24 hour  Intake           2362.5 ml  Output             1705 ml  Net            657.5 ml   Filed Weights   01/28/16 1659 01/28/16 1710 01/29/16 0145  Weight: 73.5 kg (162 lb) 73.5 kg (162 lb) 75.4 kg (166 lb 3.2 oz)    Exam:   General:  Somnolent, arousable, oriented x2,  Cardiovascular: Regular rate and rhythm, 2/6 systolic murmur  Respiratory: Decreased breath sounds in bases with intermittent crackles. Normal effort  Abdomen: less distended, + BS, non tender  Musculoskeletal: Normal tone bilaterally in upper and lower extremities, no bony abnormalities, moves all extremity coordinated fashion.   Ext: no edema  Data Reviewed: Basic Metabolic Panel:  Recent Labs Lab 01/29/16 0405 01/30/16 1001 01/31/16 0623 02/01/16 0440 02/02/16 0935  NA 131* 128* 128* 128* 134*  K 3.6 4.1 4.1 3.7 3.3*  CL 98* 100* 97* 99* 102  CO2 26 22 25 23 25   GLUCOSE 133* 101* 115* 117* 93  BUN 13 24* 20 18 13   CREATININE 0.84 1.00 0.91 0.75 0.63  CALCIUM 8.9 8.6* 8.7* 8.3* 8.5*   Liver Function Tests:  Recent Labs Lab 01/28/16 1734 01/29/16 0405 01/30/16 1001 02/01/16 0440  AST 27 20 20 22   ALT 20  17 15* 17  ALKPHOS 73 57 60 56  BILITOT 0.9 1.3* 1.3* 1.5*  PROT 7.1 5.8* 6.0* 4.9*  ALBUMIN 4.2 3.3* 2.9* 2.2*    Recent Labs Lab 01/28/16 1734  LIPASE 18   No results for input(s): AMMONIA in the last 168 hours. CBC:  Recent Labs Lab 01/28/16 1734 01/29/16 0405 01/30/16 1001 01/31/16 0623 02/01/16 0440 02/02/16 0935  WBC 12.0* 16.9* 18.7* 13.4* 10.4 7.2  NEUTROABS 9.6*  --   --   --   --   --   HGB 14.0 12.7* 12.4* 12.2* 11.0* 11.7*  HCT 39.8 38.0* 37.8* 37.1* 32.9* 35.6*  MCV 93.4 95.0 97.2 95.6 93.7 95.4  PLT 160 141* 129* 125* 134* 130*   Cardiac Enzymes:  Recent Labs Lab 01/28/16 1734  TROPONINI <0.03    BNP (last 3 results) No results for input(s): BNP in the last 8760 hours.  ProBNP (last 3 results) No results for input(s): PROBNP in the last 8760 hours.  CBG: No results for input(s): GLUCAP in the last 168 hours.  Recent Results (from the past 240 hour(s))  Culture, blood (routine x 2)     Status: Abnormal   Collection Time: 01/29/16  4:02 AM  Result Value Ref Range Status   Specimen Description BLOOD LEFT ANTECUBITAL  Final   Special Requests BOTTLES DRAWN AEROBIC AND ANAEROBIC 5CC   Final   Culture  Setup Time   Final    GRAM POSITIVE COCCI IN CLUSTERS IN BOTH AEROBIC AND ANAEROBIC BOTTLES Organism ID to follow CRITICAL RESULT CALLED TO, READ BACK BY AND VERIFIED WITH: M BELL 01/29/16 @ [place] (A)  Final   Report Status 01/31/2016 FINAL  Final   Organism ID, Bacteria METHICILLIN RESISTANT STAPHYLOCOCCUS AUREUS  Final      Susceptibility   Methicillin resistant staphylococcus aureus - MIC*    CIPROFLOXACIN >=8 RESISTANT Resistant     ERYTHROMYCIN >=8 RESISTANT Resistant     GENTAMICIN <=0.5 SENSITIVE Sensitive     OXACILLIN >=4 RESISTANT Resistant     TETRACYCLINE <=1 SENSITIVE Sensitive     VANCOMYCIN 1 SENSITIVE Sensitive     TRIMETH/SULFA <=10 SENSITIVE Sensitive     CLINDAMYCIN <=0.25 SENSITIVE Sensitive     RIFAMPIN <=0.5 SENSITIVE Sensitive     Inducible Clindamycin NEGATIVE Sensitive     * METHICILLIN RESISTANT STAPHYLOCOCCUS AUREUS  Blood Culture ID Panel (Reflexed)     Status: Abnormal   Collection Time: 01/29/16  4:02 AM  Result Value Ref Range Status   Enterococcus species NOT DETECTED NOT DETECTED Final   Vancomycin resistance NOT DETECTED NOT DETECTED Final   Listeria monocytogenes NOT DETECTED NOT DETECTED Final   Staphylococcus species DETECTED (A) NOT DETECTED Final    Comment: CRITICAL RESULT CALLED TO, READ BACK BY AND VERIFIED WITH: M BELL 01/29/16 @ 1929 M VESTAL    Staphylococcus  aureus DETECTED (A) NOT DETECTED Final    Comment: CRITICAL RESULT CALLED TO, READ BACK BY AND VERIFIED WITH: M BELL 01/29/16 @ 1929 M VESTAL    Methicillin resistance DETECTED (A) NOT DETECTED Final    Comment: CRITICAL RESULT CALLED TO, READ BACK BY AND VERIFIED WITH: M BELL 01/29/16 @ 1929 M VESTAL    Streptococcus species NOT DETECTED NOT DETECTED Final   Streptococcus agalactiae NOT DETECTED NOT DETECTED Final   Streptococcus pneumoniae NOT DETECTED NOT DETECTED Final   Streptococcus pyogenes NOT DETECTED NOT DETECTED Final   Acinetobacter  baumannii NOT DETECTED NOT DETECTED Final   Enterobacteriaceae species NOT DETECTED NOT DETECTED Final   Enterobacter cloacae complex NOT DETECTED NOT DETECTED Final   Escherichia coli NOT DETECTED NOT DETECTED Final   Klebsiella oxytoca NOT DETECTED NOT DETECTED Final   Klebsiella pneumoniae NOT DETECTED NOT DETECTED Final   Proteus species NOT DETECTED NOT DETECTED Final   Serratia marcescens NOT DETECTED NOT DETECTED Final   Carbapenem resistance NOT DETECTED NOT DETECTED Final   Haemophilus influenzae NOT DETECTED NOT DETECTED Final   Neisseria meningitidis NOT DETECTED NOT DETECTED Final   Pseudomonas aeruginosa NOT DETECTED NOT DETECTED Final   Candida albicans NOT DETECTED NOT DETECTED Final   Candida glabrata NOT DETECTED NOT DETECTED Final   Candida krusei NOT DETECTED NOT DETECTED Final   Candida parapsilosis NOT DETECTED NOT DETECTED Final   Candida tropicalis NOT DETECTED NOT DETECTED Final  Culture, blood (routine x 2)     Status: Abnormal   Collection Time: 01/29/16  4:05 AM  Result Value Ref Range Status   Specimen Description BLOOD LEFT ANTECUBITAL  Final   Special Requests BOTTLES DRAWN AEROBIC AND ANAEROBIC 5CC   Final   Culture  Setup Time   Final    GRAM POSITIVE COCCI IN CLUSTERS IN BOTH AEROBIC AND ANAEROBIC BOTTLES CRITICAL VALUE NOTED.  VALUE IS CONSISTENT WITH PREVIOUSLY REPORTED AND CALLED VALUE.    Culture (A)   Final    STAPHYLOCOCCUS AUREUS SUSCEPTIBILITIES PERFORMED ON PREVIOUS CULTURE WITHIN THE LAST 5 DAYS.    Report Status 01/31/2016 FINAL  Final  MRSA culture     Status: None   Collection Time: 01/30/16  1:16 PM  Result Value Ref Range Status   Specimen Description NASAL SWAB  Final   Special Requests NONE  Final   Culture NEGATIVE  Final   Report Status 01/31/2016 FINAL  Final  Culture, blood (routine x 2)     Status: Abnormal   Collection Time: 01/31/16  6:20 AM  Result Value Ref Range Status   Specimen Description BLOOD LEFT ANTECUBITAL  Final   Special Requests IN PEDIATRIC BOTTLE 3CC  Final   Culture  Setup Time   Final    GRAM POSITIVE COCCI IN CLUSTERS AEROBIC BOTTLE ONLY CRITICAL RESULT CALLED TO, READ BACK BY AND VERIFIED WITH: A MYER 01/31/16 @ 2119 M VESTAL    Culture (A)  Final    STAPHYLOCOCCUS AUREUS SUSCEPTIBILITIES PERFORMED ON PREVIOUS CULTURE WITHIN THE LAST 5 DAYS.    Report Status 02/02/2016 FINAL  Final  Culture, blood (routine x 2)     Status: Abnormal   Collection Time: 01/31/16  6:25 AM  Result Value Ref Range Status   Specimen Description BLOOD LEFT HAND  Final   Special Requests IN PEDIATRIC BOTTLE 1CC  Final   Culture  Setup Time   Final    GRAM POSITIVE COCCI IN CLUSTERS AEROBIC BOTTLE ONLY CRITICAL VALUE NOTED.  VALUE IS CONSISTENT WITH PREVIOUSLY REPORTED AND CALLED VALUE.    Culture (A)  Final    STAPHYLOCOCCUS AUREUS SUSCEPTIBILITIES PERFORMED ON PREVIOUS CULTURE WITHIN THE LAST 5 DAYS.    Report Status 02/02/2016 FINAL  Final     Studies: Dg Abd Portable 1v  Result Date: 02/02/2016 CLINICAL DATA:  Ileus.  Constipation.  Duration of symptoms 6 days. EXAM: PORTABLE ABDOMEN - 1 VIEW COMPARISON:  02/01/2016 FINDINGS: Gas throughout small and large bowel. Only a small amount of fecal matter noted in the colon. Findings could relate to mild ileus. No  abnormal calcifications. Chronic degenerative changes of the spine. IMPRESSION: Probable mild  ileus pattern.  No evidence of increased stool burden. Electronically Signed   By: Nelson Chimes M.D.   On: 02/02/2016 11:49   Dg Abd Portable 1v  Result Date: 02/01/2016 CLINICAL DATA:  Ileus. EXAM: PORTABLE ABDOMEN - 1 VIEW COMPARISON:  01/31/2016. FINDINGS: NG tube noted in stable position . Distended loops of small large bowel noted consistent adynamic ileus. No interim change prior exam. No free air. Degenerative changes lumbar spine and both hips. IMPRESSION: NG tube in stable position. Stable distended loops of small and large bowel consistent with adynamic ileus. No significant change from prior exam. Electronically Signed   By: Marcello Moores  Register   On: 02/01/2016 06:39   Dg Abd Portable 1v  Result Date: 01/31/2016 CLINICAL DATA:  Status post NG tube placement today. EXAM: PORTABLE ABDOMEN - 1 VIEW COMPARISON:  None. FINDINGS: NG tube is in place in good position with the side-port in the stomach and tip in the fundus. IMPRESSION: NG tube in good position. Electronically Signed   By: Inge Rise M.D.   On: 01/31/2016 18:35    Scheduled Meds: . enoxaparin (LOVENOX) injection  40 mg Subcutaneous Q24H  . metoprolol  5 mg Intravenous Q6H  . polyethylene glycol  17 g Oral Daily  . potassium chloride  10 mEq Intravenous Q1 Hr x 4  . tamsulosin  0.4 mg Oral QPC breakfast  . vancomycin  1,250 mg Intravenous Q12H   Continuous Infusions: . sodium chloride 75 mL/hr at 02/02/16 [id number]    Principal Problem:   MRSA bacteremia Active Problems:   Rheumatoid arthritis involving multiple sites with positive rheumatoid factor (HCC)   Essential hypertension   Hyponatremia   Left adrenal mass (HCC)   Epigastric pain   Hyperglycemia   Normocytic anemia   Thrombocytopenia (HCC)   Atherosclerotic peripheral vascular disease (HCC)   Degenerative joint disease of spine   BPH (benign prostatic hyperplasia)    Time spent: 11min    Shruti Arrey  [phone] [facility]  If 7PM-7AM,  please contact night-coverage at [email or web address], password [facility] 02/02/2016, 1:58 PM  LOS: 4 days

## 2016-02-02 NOTE — Significant Event (Addendum)
Gold tone wedding band placed back on patient's finger after patient was returned from MRI-this is witnessed by transport [employer].   On call provider notified that patient has had his MRI and to look for result as technician told RN that patient has a compression fracture. The image and report is not in EPIC yet. Per M. Lynch continue with existing orders.

## 2016-02-02 NOTE — Progress Notes (Signed)
Patient ID: Tanner Rice, male   DOB: [birth year], 82 y.o.   MRN: [id number]          [facility]    Date of Admission:  01/28/2016           Day 4 vancomycin  Principal Problem:   MRSA bacteremia Active Problems:   Epigastric pain   Rheumatoid arthritis involving multiple sites with positive rheumatoid factor (HCC)   Essential hypertension   Hyponatremia   Left adrenal mass (HCC)   Hyperglycemia   Normocytic anemia   Thrombocytopenia (HCC)   Atherosclerotic peripheral vascular disease (HCC)   Degenerative joint disease of spine   BPH (benign prostatic hyperplasia)   . enoxaparin (LOVENOX) injection  40 mg Subcutaneous Q24H  . metoprolol  5 mg Intravenous Q6H  . tamsulosin  0.4 mg Oral QPC breakfast  . vancomycin  1,250 mg Intravenous Q12H    SUBJECTIVE: He continues to have 10 out of 10 pain wrapping around his lower rib cage.  Review of Systems: Review of Systems  Constitutional: Positive for malaise/fatigue. Negative for chills, diaphoresis and fever.  Respiratory: Positive for cough and sputum production.   Cardiovascular: Positive for chest pain.  Gastrointestinal: Positive for abdominal pain. Negative for constipation, diarrhea, nausea and vomiting.    Past Medical History:  Diagnosis Date  . Hypertension   . Melanoma ([place])   . Rheumatoid arthritis(714.0)     Social History  Substance Use Topics  . Smoking status: Former Research scientist (life sciences)  . Smokeless tobacco: Never Used  . Alcohol use Yes     Comment: glass of wine a day     Family History  Problem Relation Age of Onset  . Prostate cancer Father   . Prostate cancer Brother   . Osteoarthritis Brother    Allergies  Allergen Reactions  . Other Diarrhea, Nausea And Vomiting and Other (See Comments)    Seafood - muscles   . Penicillins Cross Reactors Hives    OBJECTIVE: Vitals:   02/02/16 0835 02/02/16 0930 02/02/16 1000 02/02/16 1022  BP:  (!) 154/85  (!) 177/92  Pulse:   81 79 100  Resp:  (!) 6 17 10   Temp: 99.8 F (37.7 C)   (!) 96.8 F (36 C)  TempSrc: Oral   Axillary  SpO2:  95% 91% 94%  Weight:      Height:       Body mass index is 26.83 kg/m.  Physical Exam  Constitutional:  He is sitting up in a chair. He is very uncomfortable due to pain. He is somewhat groggy. His wife is at the bedside.  Cardiovascular: Normal rate and regular rhythm.   No murmur heard. Pulmonary/Chest: Effort normal and breath sounds normal.  Abdominal: Soft. He exhibits distension. There is no tenderness.  Musculoskeletal: Normal range of motion. He exhibits no edema or tenderness.  Neurological: He is alert.  Skin: No rash noted.    Lab Results Lab Results  Component Value Date   WBC 7.2 02/02/2016   HGB 11.7 (L) 02/02/2016   HCT 35.6 (L) 02/02/2016   MCV 95.4 02/02/2016   PLT 130 (L) 02/02/2016    Lab Results  Component Value Date   CREATININE 0.75 02/01/2016   BUN 18 02/01/2016   NA 128 (L) 02/01/2016   K 3.7 02/01/2016   CL 99 (L) 02/01/2016   CO2 23 02/01/2016    Lab Results  Component Value Date   ALT 17 02/01/2016   AST 22  02/01/2016   ALKPHOS 56 02/01/2016   BILITOT 1.5 (H) 02/01/2016     Microbiology: Recent Results (from the past 240 hour(s))  Culture, blood (routine x 2)     Status: Abnormal   Collection Time: 01/29/16  4:02 AM  Result Value Ref Range Status   Specimen Description BLOOD LEFT ANTECUBITAL  Final   Special Requests BOTTLES DRAWN AEROBIC AND ANAEROBIC 5CC   Final   Culture  Setup Time   Final    GRAM POSITIVE COCCI IN CLUSTERS IN BOTH AEROBIC AND ANAEROBIC BOTTLES Organism ID to follow CRITICAL RESULT CALLED TO, READ BACK BY AND VERIFIED WITH: M BELL 01/29/16 @ [place] (A)  Final   Report Status 01/31/2016 FINAL  Final   Organism ID, Bacteria METHICILLIN RESISTANT STAPHYLOCOCCUS AUREUS  Final      Susceptibility   Methicillin resistant staphylococcus  aureus - MIC*    CIPROFLOXACIN >=8 RESISTANT Resistant     ERYTHROMYCIN >=8 RESISTANT Resistant     GENTAMICIN <=0.5 SENSITIVE Sensitive     OXACILLIN >=4 RESISTANT Resistant     TETRACYCLINE <=1 SENSITIVE Sensitive     VANCOMYCIN 1 SENSITIVE Sensitive     TRIMETH/SULFA <=10 SENSITIVE Sensitive     CLINDAMYCIN <=0.25 SENSITIVE Sensitive     RIFAMPIN <=0.5 SENSITIVE Sensitive     Inducible Clindamycin NEGATIVE Sensitive     * METHICILLIN RESISTANT STAPHYLOCOCCUS AUREUS  Blood Culture ID Panel (Reflexed)     Status: Abnormal   Collection Time: 01/29/16  4:02 AM  Result Value Ref Range Status   Enterococcus species NOT DETECTED NOT DETECTED Final   Vancomycin resistance NOT DETECTED NOT DETECTED Final   Listeria monocytogenes NOT DETECTED NOT DETECTED Final   Staphylococcus species DETECTED (A) NOT DETECTED Final    Comment: CRITICAL RESULT CALLED TO, READ BACK BY AND VERIFIED WITH: M BELL 01/29/16 @ 1929 M VESTAL    Staphylococcus aureus DETECTED (A) NOT DETECTED Final    Comment: CRITICAL RESULT CALLED TO, READ BACK BY AND VERIFIED WITH: M BELL 01/29/16 @ 1929 M VESTAL    Methicillin resistance DETECTED (A) NOT DETECTED Final    Comment: CRITICAL RESULT CALLED TO, READ BACK BY AND VERIFIED WITH: M BELL 01/29/16 @ 1929 M VESTAL    Streptococcus species NOT DETECTED NOT DETECTED Final   Streptococcus agalactiae NOT DETECTED NOT DETECTED Final   Streptococcus pneumoniae NOT DETECTED NOT DETECTED Final   Streptococcus pyogenes NOT DETECTED NOT DETECTED Final   Acinetobacter baumannii NOT DETECTED NOT DETECTED Final   Enterobacteriaceae species NOT DETECTED NOT DETECTED Final   Enterobacter cloacae complex NOT DETECTED NOT DETECTED Final   Escherichia coli NOT DETECTED NOT DETECTED Final   Klebsiella oxytoca NOT DETECTED NOT DETECTED Final   Klebsiella pneumoniae NOT DETECTED NOT DETECTED Final   Proteus species NOT DETECTED NOT DETECTED Final   Serratia marcescens NOT DETECTED NOT  DETECTED Final   Carbapenem resistance NOT DETECTED NOT DETECTED Final   Haemophilus influenzae NOT DETECTED NOT DETECTED Final   Neisseria meningitidis NOT DETECTED NOT DETECTED Final   Pseudomonas aeruginosa NOT DETECTED NOT DETECTED Final   Candida albicans NOT DETECTED NOT DETECTED Final   Candida glabrata NOT DETECTED NOT DETECTED Final   Candida krusei NOT DETECTED NOT DETECTED Final   Candida parapsilosis NOT DETECTED NOT DETECTED Final   Candida tropicalis NOT DETECTED NOT DETECTED Final  Culture, blood (routine x 2)     Status: Abnormal  Collection Time: 01/29/16  4:05 AM  Result Value Ref Range Status   Specimen Description BLOOD LEFT ANTECUBITAL  Final   Special Requests BOTTLES DRAWN AEROBIC AND ANAEROBIC 5CC   Final   Culture  Setup Time   Final    GRAM POSITIVE COCCI IN CLUSTERS IN BOTH AEROBIC AND ANAEROBIC BOTTLES CRITICAL VALUE NOTED.  VALUE IS CONSISTENT WITH PREVIOUSLY REPORTED AND CALLED VALUE.    Culture (A)  Final    STAPHYLOCOCCUS AUREUS SUSCEPTIBILITIES PERFORMED ON PREVIOUS CULTURE WITHIN THE LAST 5 DAYS.    Report Status 01/31/2016 FINAL  Final  MRSA culture     Status: None   Collection Time: 01/30/16  1:16 PM  Result Value Ref Range Status   Specimen Description NASAL SWAB  Final   Special Requests NONE  Final   Culture NEGATIVE  Final   Report Status 01/31/2016 FINAL  Final  Culture, blood (routine x 2)     Status: Abnormal (Preliminary result)   Collection Time: 01/31/16  6:20 AM  Result Value Ref Range Status   Specimen Description BLOOD LEFT ANTECUBITAL  Final   Special Requests IN PEDIATRIC BOTTLE 3CC  Final   Culture  Setup Time   Final    GRAM POSITIVE COCCI IN CLUSTERS AEROBIC BOTTLE ONLY CRITICAL RESULT CALLED TO, READ BACK BY AND VERIFIED WITH: A MYER 01/31/16 @ 2119 M VESTAL    Culture (A)  Final    STAPHYLOCOCCUS AUREUS SUSCEPTIBILITIES PERFORMED ON PREVIOUS CULTURE WITHIN THE LAST 5 DAYS.    Report Status PENDING  Incomplete    Culture, blood (routine x 2)     Status: Abnormal (Preliminary result)   Collection Time: 01/31/16  6:25 AM  Result Value Ref Range Status   Specimen Description BLOOD LEFT HAND  Final   Special Requests IN PEDIATRIC BOTTLE 1CC  Final   Culture  Setup Time   Final    GRAM POSITIVE COCCI IN CLUSTERS AEROBIC BOTTLE ONLY CRITICAL VALUE NOTED.  VALUE IS CONSISTENT WITH PREVIOUSLY REPORTED AND CALLED VALUE.    Culture (A)  Final    STAPHYLOCOCCUS AUREUS SUSCEPTIBILITIES PERFORMED ON PREVIOUS CULTURE WITHIN THE LAST 5 DAYS.    Report Status PENDING  Incomplete     ASSESSMENT: He has MRSA bacteremia and I am concerned that he could have vertebral infection causing his severe pain. Hopefully he can get his thoracic and lumbar MRI today. Repeat blood cultures are negative at 24 hours. Fortunately his TEE this morning did not reveal any evidence of endocarditis.  PLAN: 1. Continue vancomycin 2. Await results of repeat blood cultures 3. MRI of thoracic and lumbar spine and possible 4. Hold off on PICC placement until blood cultures are negative  Michel Bickers, MD [facility] [place] [phone] pager   [phone] cell 02/02/2016, 10:45 AM

## 2016-02-03 DIAGNOSIS — M4624 Osteomyelitis of vertebra, thoracic region: Secondary | ICD-10-CM

## 2016-02-03 DIAGNOSIS — B9562 Methicillin resistant Staphylococcus aureus infection as the cause of diseases classified elsewhere: Secondary | ICD-10-CM

## 2016-02-03 DIAGNOSIS — E876 Hypokalemia: Secondary | ICD-10-CM

## 2016-02-03 DIAGNOSIS — M4644 Discitis, unspecified, thoracic region: Secondary | ICD-10-CM

## 2016-02-03 DIAGNOSIS — G061 Intraspinal abscess and granuloma: Secondary | ICD-10-CM

## 2016-02-03 DIAGNOSIS — I10 Essential (primary) hypertension: Secondary | ICD-10-CM

## 2016-02-03 DIAGNOSIS — K567 Ileus, unspecified: Secondary | ICD-10-CM

## 2016-02-03 LAB — BASIC METABOLIC PANEL
ANION GAP: 9 (ref 5–15)
BUN: 12 mg/dL (ref 6–20)
CO2: 26 mmol/L (ref 22–32)
Calcium: 8.9 mg/dL (ref 8.9–10.3)
Chloride: 98 mmol/L — ABNORMAL LOW (ref 101–111)
Creatinine, Ser: 0.65 mg/dL (ref 0.61–1.24)
GFR calc Af Amer: >60 mL/min (ref >60)
GFR calc non Af Amer: >60 mL/min (ref >60)
GLUCOSE: 110 mg/dL — AB (ref 65–99)
POTASSIUM: 3.1 mmol/L — AB (ref 3.5–5.1)
Sodium: 133 mmol/L — ABNORMAL LOW (ref 135–145)

## 2016-02-03 LAB — CBC
HEMATOCRIT: 33.8 % — AB (ref 39.0–52.0)
Hemoglobin: 11.4 g/dL — ABNORMAL LOW (ref 13.0–17.0)
MCH: 31.6 pg (ref 26.0–34.0)
MCHC: 33.7 g/dL (ref 30.0–36.0)
MCV: 93.6 fL (ref 78.0–100.0)
Platelets: 123 10*3/uL — ABNORMAL LOW (ref 150–400)
RBC: 3.61 MIL/uL — AB (ref 4.22–5.81)
RDW: 12.8 % (ref 11.5–15.5)
WBC: 5 10*3/uL (ref 4.0–10.5)

## 2016-02-03 LAB — VANCOMYCIN, TROUGH: Vancomycin Tr: 14 ug/mL — ABNORMAL LOW (ref 15–20)

## 2016-02-03 LAB — PROCALCITONIN: Procalcitonin: 0.55 ng/mL

## 2016-02-03 LAB — GLUCOSE, CAPILLARY: Glucose-Capillary: 94 mg/dL (ref 65–99)

## 2016-02-03 MED ORDER — POTASSIUM CHLORIDE 20 MEQ/15ML (10%) PO SOLN
40.0000 meq | Freq: Once | ORAL | Status: AC
  Administered 2016-02-03: 40 meq via ORAL
  Filled 2016-02-03: qty 30

## 2016-02-03 MED ORDER — METHOCARBAMOL 750 MG PO TABS: 750.0000 mg | ORAL_TABLET | Freq: Three times a day (TID) | ORAL | Status: DC | PRN

## 2016-02-03 MED ORDER — VANCOMYCIN HCL 10 G IV SOLR
1500.0000 mg | Freq: Once | INTRAVENOUS | Status: AC
  Administered 2016-02-03: 1500 mg via INTRAVENOUS
  Filled 2016-02-03: qty 1500

## 2016-02-03 NOTE — Progress Notes (Signed)
INFECTIOUS DISEASE PROGRESS NOTE  ID: Tanner Rice is a 82 y.o. male with  Principal Problem:   MRSA bacteremia Active Problems:   Rheumatoid arthritis involving multiple sites with positive rheumatoid factor (HCC)   Essential hypertension   Hyponatremia   Left adrenal mass (HCC)   Epigastric pain   Hyperglycemia   Normocytic anemia   Thrombocytopenia (HCC)   Atherosclerotic peripheral vascular disease (HCC)   Degenerative joint disease of spine   BPH (benign prostatic hyperplasia)  Subjective: Feels poorly. "I haven't moved my bowels in a week and a day" Continued back pain, albeit better.   Abtx:  Anti-infectives    Start     Dose/Rate Route Frequency Ordered Stop   01/31/16 2300  vancomycin (VANCOCIN) 1,250 mg in sodium chloride 0.9 % 250 mL IVPB     1,250 mg 166.7 mL/hr over 90 Minutes Intravenous Every 12 hours 01/31/16 2220     01/29/16 2130  vancomycin (VANCOCIN) IVPB 1000 mg/200 mL premix  Status:  Discontinued     1,000 mg 200 mL/hr over 60 Minutes Intravenous Every 12 hours 01/29/16 2118 01/31/16 2220   01/29/16 0600  levofloxacin (LEVAQUIN) IVPB 750 mg  Status:  Discontinued     750 mg 100 mL/hr over 90 Minutes Intravenous Every 24 hours 01/29/16 0236 01/30/16 1016   01/29/16 0600  metroNIDAZOLE (FLAGYL) IVPB 500 mg  Status:  Discontinued     500 mg 100 mL/hr over 60 Minutes Intravenous Every 8 hours 01/29/16 0236 01/30/16 1016   01/28/16 2130  ciprofloxacin (CIPRO) IVPB 400 mg     400 mg 200 mL/hr over 60 Minutes Intravenous  Once 01/28/16 2122 01/28/16 2251   01/28/16 2130  metroNIDAZOLE (FLAGYL) tablet 500 mg     500 mg Oral  Once 01/28/16 2122 01/28/16 2131      Medications:  Scheduled: . enoxaparin (LOVENOX) injection  40 mg Subcutaneous Q24H  . LORazepam  1 mg Intravenous Once  . metoprolol  5 mg Intravenous Q6H  . polyethylene glycol  17 g Oral Daily  . tamsulosin  0.4 mg Oral QPC breakfast  . vancomycin  1,250 mg Intravenous Q12H     Objective: Vital signs in last 24 hours: Temp:  [98.3 F (36.8 C)-99.9 F (37.7 C)] 98.3 F (36.8 C) (08/26 0750) Pulse Rate:  [62-155] 71 (08/26 1146) Resp:  [10-25] 11 (08/26 1000) BP: (131-183)/(70-104) 170/84 (08/26 1146) SpO2:  [92 %-98 %] 96 % (08/26 1000)   General appearance: alert, cooperative and mild distress Resp: clear to auscultation bilaterally Cardio: regular rate and rhythm GI: normal findings: bowel sounds normal and soft, non-tender and abnormal findings:  distended Extremities: edema none  Lab Results  Recent Labs  02/02/16 0935 02/03/16 0318  WBC 7.2 5.0  HGB 11.7* 11.4*  HCT 35.6* 33.8*  NA 134* 133*  K 3.3* 3.1*  CL 102 98*  CO2 25 26  BUN 13 12  CREATININE 0.63 0.65   Liver Panel  Recent Labs  02/01/16 0440  PROT 4.9*  ALBUMIN 2.2*  AST 22  ALT 17  ALKPHOS 56  BILITOT 1.5*   Sedimentation Rate No results for input(s): ESRSEDRATE in the last 72 hours. C-Reactive Protein No results for input(s): CRP in the last 72 hours.  Microbiology: Recent Results (from the past 240 hour(s))  Culture, blood (routine x 2)     Status: Abnormal   Collection Time: 01/29/16  4:02 AM  Result Value Ref Range Status   Specimen Description BLOOD  LEFT ANTECUBITAL  Final   Special Requests BOTTLES DRAWN AEROBIC AND ANAEROBIC 5CC   Final   Culture  Setup Time   Final    GRAM POSITIVE COCCI IN CLUSTERS IN BOTH AEROBIC AND ANAEROBIC BOTTLES Organism ID to follow CRITICAL RESULT CALLED TO, READ BACK BY AND VERIFIED WITH: M BELL 01/29/16 @ [place] (A)  Final   Report Status 01/31/2016 FINAL  Final   Organism ID, Bacteria METHICILLIN RESISTANT STAPHYLOCOCCUS AUREUS  Final      Susceptibility   Methicillin resistant staphylococcus aureus - MIC*    CIPROFLOXACIN >=8 RESISTANT Resistant     ERYTHROMYCIN >=8 RESISTANT Resistant     GENTAMICIN <=0.5 SENSITIVE Sensitive     OXACILLIN >=4  RESISTANT Resistant     TETRACYCLINE <=1 SENSITIVE Sensitive     VANCOMYCIN 1 SENSITIVE Sensitive     TRIMETH/SULFA <=10 SENSITIVE Sensitive     CLINDAMYCIN <=0.25 SENSITIVE Sensitive     RIFAMPIN <=0.5 SENSITIVE Sensitive     Inducible Clindamycin NEGATIVE Sensitive     * METHICILLIN RESISTANT STAPHYLOCOCCUS AUREUS  Blood Culture ID Panel (Reflexed)     Status: Abnormal   Collection Time: 01/29/16  4:02 AM  Result Value Ref Range Status   Enterococcus species NOT DETECTED NOT DETECTED Final   Vancomycin resistance NOT DETECTED NOT DETECTED Final   Listeria monocytogenes NOT DETECTED NOT DETECTED Final   Staphylococcus species DETECTED (A) NOT DETECTED Final    Comment: CRITICAL RESULT CALLED TO, READ BACK BY AND VERIFIED WITH: M BELL 01/29/16 @ 1929 M VESTAL    Staphylococcus aureus DETECTED (A) NOT DETECTED Final    Comment: CRITICAL RESULT CALLED TO, READ BACK BY AND VERIFIED WITH: M BELL 01/29/16 @ 1929 M VESTAL    Methicillin resistance DETECTED (A) NOT DETECTED Final    Comment: CRITICAL RESULT CALLED TO, READ BACK BY AND VERIFIED WITH: M BELL 01/29/16 @ 1929 M VESTAL    Streptococcus species NOT DETECTED NOT DETECTED Final   Streptococcus agalactiae NOT DETECTED NOT DETECTED Final   Streptococcus pneumoniae NOT DETECTED NOT DETECTED Final   Streptococcus pyogenes NOT DETECTED NOT DETECTED Final   Acinetobacter baumannii NOT DETECTED NOT DETECTED Final   Enterobacteriaceae species NOT DETECTED NOT DETECTED Final   Enterobacter cloacae complex NOT DETECTED NOT DETECTED Final   Escherichia coli NOT DETECTED NOT DETECTED Final   Klebsiella oxytoca NOT DETECTED NOT DETECTED Final   Klebsiella pneumoniae NOT DETECTED NOT DETECTED Final   Proteus species NOT DETECTED NOT DETECTED Final   Serratia marcescens NOT DETECTED NOT DETECTED Final   Carbapenem resistance NOT DETECTED NOT DETECTED Final   Haemophilus influenzae NOT DETECTED NOT DETECTED Final   Neisseria meningitidis NOT  DETECTED NOT DETECTED Final   Pseudomonas aeruginosa NOT DETECTED NOT DETECTED Final   Candida albicans NOT DETECTED NOT DETECTED Final   Candida glabrata NOT DETECTED NOT DETECTED Final   Candida krusei NOT DETECTED NOT DETECTED Final   Candida parapsilosis NOT DETECTED NOT DETECTED Final   Candida tropicalis NOT DETECTED NOT DETECTED Final  Culture, blood (routine x 2)     Status: Abnormal   Collection Time: 01/29/16  4:05 AM  Result Value Ref Range Status   Specimen Description BLOOD LEFT ANTECUBITAL  Final   Special Requests BOTTLES DRAWN AEROBIC AND ANAEROBIC 5CC   Final   Culture  Setup Time   Final    GRAM POSITIVE COCCI IN CLUSTERS IN BOTH AEROBIC AND  ANAEROBIC BOTTLES CRITICAL VALUE NOTED.  VALUE IS CONSISTENT WITH PREVIOUSLY REPORTED AND CALLED VALUE.    Culture (A)  Final    STAPHYLOCOCCUS AUREUS SUSCEPTIBILITIES PERFORMED ON PREVIOUS CULTURE WITHIN THE LAST 5 DAYS.    Report Status 01/31/2016 FINAL  Final  MRSA culture     Status: None   Collection Time: 01/30/16  1:16 PM  Result Value Ref Range Status   Specimen Description NASAL SWAB  Final   Special Requests NONE  Final   Culture NEGATIVE  Final   Report Status 01/31/2016 FINAL  Final  Culture, blood (routine x 2)     Status: Abnormal   Collection Time: 01/31/16  6:20 AM  Result Value Ref Range Status   Specimen Description BLOOD LEFT ANTECUBITAL  Final   Special Requests IN PEDIATRIC BOTTLE 3CC  Final   Culture  Setup Time   Final    GRAM POSITIVE COCCI IN CLUSTERS AEROBIC BOTTLE ONLY CRITICAL RESULT CALLED TO, READ BACK BY AND VERIFIED WITH: A MYER 01/31/16 @ 2119 M VESTAL    Culture (A)  Final    STAPHYLOCOCCUS AUREUS SUSCEPTIBILITIES PERFORMED ON PREVIOUS CULTURE WITHIN THE LAST 5 DAYS.    Report Status 02/02/2016 FINAL  Final  Culture, blood (routine x 2)     Status: Abnormal   Collection Time: 01/31/16  6:25 AM  Result Value Ref Range Status   Specimen Description BLOOD LEFT HAND  Final   Special  Requests IN PEDIATRIC BOTTLE 1CC  Final   Culture  Setup Time   Final    GRAM POSITIVE COCCI IN CLUSTERS AEROBIC BOTTLE ONLY CRITICAL VALUE NOTED.  VALUE IS CONSISTENT WITH PREVIOUSLY REPORTED AND CALLED VALUE.    Culture (A)  Final    STAPHYLOCOCCUS AUREUS SUSCEPTIBILITIES PERFORMED ON PREVIOUS CULTURE WITHIN THE LAST 5 DAYS.    Report Status 02/02/2016 FINAL  Final  Culture, blood (routine x 2)     Status: None (Preliminary result)   Collection Time: 02/01/16 10:48 AM  Result Value Ref Range Status   Specimen Description BLOOD RIGHT ARM  Final   Special Requests   Final    BOTTLES DRAWN AEROBIC AND ANAEROBIC 10CC BLUE 5 CC RED   Culture NO GROWTH 1 DAY  Final   Report Status PENDING  Incomplete  Culture, blood (routine x 2)     Status: None (Preliminary result)   Collection Time: 02/01/16 10:55 AM  Result Value Ref Range Status   Specimen Description BLOOD LEFT HAND  Final   Special Requests IN PEDIATRIC BOTTLE 3CC  Final   Culture NO GROWTH 1 DAY  Final   Report Status PENDING  Incomplete    Studies/Results: Mr Thoracic Spine W Wo Contrast  Result Date: 02/02/2016 CLINICAL DATA:  Severe back pain.  Blood cultures positive for MRSA EXAM: MRI THORACIC AND LUMBAR SPINE WITHOUT AND WITH CONTRAST TECHNIQUE: Multiplanar and multiecho pulse sequences of the thoracic and lumbar spine were obtained without and with intravenous contrast. CONTRAST:  52mL MULTIHANCE GADOBENATE DIMEGLUMINE 529 MG/ML IV SOLN COMPARISON:  CT abdomen pelvis 01/28/2016 FINDINGS: MRI THORACIC SPINE FINDINGS Alignment: Mild anterior listhesis T1-2 and T2-3 related to disc and facet degeneration. Remaining alignment normal. Vertebrae: Mild vertebral body edema at T8-9 with associated disc space changes compatible with discitis and osteomyelitis. Mild enhancement of the bone marrow at T8-9. Hemangioma T12 vertebral body. Hemangioma T4 vertebral body. 6 mm bone marrow lesion in the T6 vertebral body which shows mild  enhancement. This may represent a hemangioma  but it is not hyperintense on T1 prior to contrast. Cord: Cord compression at T8-9 related to epidural abscess. Cord signal normal. Paraspinal and other soft tissues: Discitis and osteomyelitis at T8-9. Ventral epidural abscess extends to the right of midline and is causing cord compression. Epidural abscess also extends anterior to the vertebral bodies into the paraspinous soft tissues. There is edema at in the paraspinous soft tissues anteriorly. Moderately large pleural effusions bilaterally. Disc levels: Discitis and osteomyelitis at T8-9 with epidural abscess and cord compression. No other levels of infection identified. No fracture identified. MRI LUMBAR SPINE FINDINGS Segmentation:  Normal segmentation Alignment: Mild anterior slip at L4-5 and L5-S1. Mild retrolisthesis L2-3 and L3-4. Vertebrae: Negative for fracture or mass. Hemangioma T12 vertebral body. Mild enhancement around a Schmorl's node at L2-3. No evidence of discitis or osteomyelitis in the lumbar spine. Negative for metastatic disease. Conus medullaris: Extends to the T12-L1 level and appears normal. Paraspinal and other soft tissues: Paraspinous muscles are normal. No retroperitoneal mass or adenopathy or fluid collection. Disc levels: T12-L1:  Mild disc and mild facet degeneration L2-3: Mild disc and facet degeneration. Mild left foraminal narrowing. L2-3: Diffuse disc bulging and facet degeneration. Mild spinal stenosis. Mild left foraminal narrowing. L3-4: Disc degeneration with endplate osteophyte formation. Mild facet degeneration. Mild foraminal narrowing bilaterally. L4-5: Disc degeneration with diffuse disc bulging. Advanced facet arthrosis bilaterally with facet overgrowth and bilateral facet joint effusions. Moderate spinal stenosis. Moderate to severe subarticular and foraminal stenosis bilaterally right greater than left. L5-S1: Grade 1 anterior listhesis with advanced facet arthrosis.  Mild foraminal encroachment bilaterally. IMPRESSION: Discitis and osteomyelitis at T8-9. Ventral epidural abscess extending to the right of midline causes spinal stenosis and cord compression. Cord signal normal. Abscess extends anterior to the disc space in the paraspinous soft tissues which are edematous. No other areas of infection in the thoracic or lumbar spine. Lumbar degenerative changes as above without acute abnormality in the lumbar spine Moderately large bilateral pleural effusions. Critical Value/emergent results were called by telephone at the time of interpretation on 02/02/2016 at 8:16 pm to Walden Field NP , who verbally acknowledged these results. Electronically Signed   By: Franchot Gallo M.D.   On: 02/02/2016 20:17   Mr Lumbar Spine W Wo Contrast  Result Date: 02/02/2016 CLINICAL DATA:  Severe back pain.  Blood cultures positive for MRSA EXAM: MRI THORACIC AND LUMBAR SPINE WITHOUT AND WITH CONTRAST TECHNIQUE: Multiplanar and multiecho pulse sequences of the thoracic and lumbar spine were obtained without and with intravenous contrast. CONTRAST:  66mL MULTIHANCE GADOBENATE DIMEGLUMINE 529 MG/ML IV SOLN COMPARISON:  CT abdomen pelvis 01/28/2016 FINDINGS: MRI THORACIC SPINE FINDINGS Alignment: Mild anterior listhesis T1-2 and T2-3 related to disc and facet degeneration. Remaining alignment normal. Vertebrae: Mild vertebral body edema at T8-9 with associated disc space changes compatible with discitis and osteomyelitis. Mild enhancement of the bone marrow at T8-9. Hemangioma T12 vertebral body. Hemangioma T4 vertebral body. 6 mm bone marrow lesion in the T6 vertebral body which shows mild enhancement. This may represent a hemangioma but it is not hyperintense on T1 prior to contrast. Cord: Cord compression at T8-9 related to epidural abscess. Cord signal normal. Paraspinal and other soft tissues: Discitis and osteomyelitis at T8-9. Ventral epidural abscess extends to the right of midline and is  causing cord compression. Epidural abscess also extends anterior to the vertebral bodies into the paraspinous soft tissues. There is edema at in the paraspinous soft tissues anteriorly. Moderately large pleural effusions bilaterally. Disc  levels: Discitis and osteomyelitis at T8-9 with epidural abscess and cord compression. No other levels of infection identified. No fracture identified. MRI LUMBAR SPINE FINDINGS Segmentation:  Normal segmentation Alignment: Mild anterior slip at L4-5 and L5-S1. Mild retrolisthesis L2-3 and L3-4. Vertebrae: Negative for fracture or mass. Hemangioma T12 vertebral body. Mild enhancement around a Schmorl's node at L2-3. No evidence of discitis or osteomyelitis in the lumbar spine. Negative for metastatic disease. Conus medullaris: Extends to the T12-L1 level and appears normal. Paraspinal and other soft tissues: Paraspinous muscles are normal. No retroperitoneal mass or adenopathy or fluid collection. Disc levels: T12-L1:  Mild disc and mild facet degeneration L2-3: Mild disc and facet degeneration. Mild left foraminal narrowing. L2-3: Diffuse disc bulging and facet degeneration. Mild spinal stenosis. Mild left foraminal narrowing. L3-4: Disc degeneration with endplate osteophyte formation. Mild facet degeneration. Mild foraminal narrowing bilaterally. L4-5: Disc degeneration with diffuse disc bulging. Advanced facet arthrosis bilaterally with facet overgrowth and bilateral facet joint effusions. Moderate spinal stenosis. Moderate to severe subarticular and foraminal stenosis bilaterally right greater than left. L5-S1: Grade 1 anterior listhesis with advanced facet arthrosis. Mild foraminal encroachment bilaterally. IMPRESSION: Discitis and osteomyelitis at T8-9. Ventral epidural abscess extending to the right of midline causes spinal stenosis and cord compression. Cord signal normal. Abscess extends anterior to the disc space in the paraspinous soft tissues which are edematous. No  other areas of infection in the thoracic or lumbar spine. Lumbar degenerative changes as above without acute abnormality in the lumbar spine Moderately large bilateral pleural effusions. Critical Value/emergent results were called by telephone at the time of interpretation on 02/02/2016 at 8:16 pm to Walden Field NP , who verbally acknowledged these results. Electronically Signed   By: Franchot Gallo M.D.   On: 02/02/2016 20:17   Dg Abd Portable 1v  Result Date: 02/02/2016 CLINICAL DATA:  Ileus.  Constipation.  Duration of symptoms 6 days. EXAM: PORTABLE ABDOMEN - 1 VIEW COMPARISON:  02/01/2016 FINDINGS: Gas throughout small and large bowel. Only a small amount of fecal matter noted in the colon. Findings could relate to mild ileus. No abnormal calcifications. Chronic degenerative changes of the spine. IMPRESSION: Probable mild ileus pattern.  No evidence of increased stool burden. Electronically Signed   By: Nelson Chimes M.D.   On: 02/02/2016 11:49     Assessment/Plan: MRSA Bacteremia (8-21 and 8-23) Epidural Abscess, Osteo, Discitis T8/9 Ileus HTN hypokalemia  Appreciate neurosurgery f/u TEE negative for vegetation.  BCx negative 8-24.  Will continue vanco, watch his f/u BCx.  Cr stable on vanco Bowel hygeine Primary to manage HTN, potassium  Total days of antibiotics: 5 vanco         Bobby Rumpf Infectious Diseases (pager) [phone] [email or web address] 02/03/2016, 12:02 PM  LOS: 5 days

## 2016-02-03 NOTE — Progress Notes (Signed)
Pharmacy Antibiotic Note  Tanner Rice is a 82 y.o. male admitted on 01/28/2016 with abdominal pain. Pharmacy consulted for Vancomycin dosing on 8/21 when BCID positive for GPC in clusters;  MRSA bacteremia. MIC to Vanc =1  Day # 6/42  Vancomycin  VT 14 gaol 15-20 on vancomycin 1250mg  q12 Plan for patient to go home on vancomycin soon would be concerned for accumulation if dose increased Will give vancomycin 1500mg  IV x1 then continue 1250mg  q12  Plan:  Increase Vancomycin to 1250 mg IV q12hrs.  Recheck Vanc trough level at steady-state.  Follow renal function, final culture data, studies, and progress.  Height: 5\' 6"  (167.6 cm) Weight: 166 lb 3.2 oz (75.4 kg) IBW/kg (Calculated) : 63.8  Temp (24hrs), Avg:98.9 F (37.2 C), Min:98.3 F (36.8 C), Max:99.9 F (37.7 C)   Recent Labs Lab 01/28/16 1746  01/30/16 1001 01/30/16 1007 01/30/16 1228 01/31/16 0623 01/31/16 2038 02/01/16 0440 02/02/16 0935 02/03/16 0318 02/03/16 1019  WBC  --   < > 18.7*  --   --  13.4*  --  10.4 7.2 5.0  --   CREATININE  --   < > 1.00  --   --  0.91  --  0.75 0.63 0.65  --   LATICACIDVEN 0.98  --   --  0.8 0.8  --   --   --   --   --   --   VANCOTROUGH  --   --   --   --   --   --  11*  --   --   --  14*  < > = values in this interval not displayed.  Estimated Creatinine Clearance: 66.5 mL/min (by C-G formula based on SCr of 0.8 mg/dL).    Allergies  Allergen Reactions  . Other Diarrhea, Nausea And Vomiting and Other (See Comments)    Seafood - muscles   . Penicillins Cross Reactors Hives    Antimicrobials this admission:  Cipro x 1 on 8/20  Flagyl 8/20>>8/22  Levaquin 8/21>>8/22  Vancomycin 8/21>>  Dose adjustments this admission:   8/23 pm: Vanc trough 11 mcg/ml on Vanc 1gm IV q12hrs -> increased to 1250 mg IV q12hrs 8/26 VT 14 on vanc 1250mg  q12 goal trough 15-20 vanc 1500mg  x1 then resume 1250mg  q12  Microbiology results: 8/24 BCx IP   8/23 blood x 2 -MRSA   8/22 MRSA nasal  swab negative   8/21 blood x 2 - MRSA, MIC to Vanc = 1  Bonnita Nasuti Pharm.D. CPP, BCPS Clinical Pharmacist [phone] 02/03/2016 1:49 PM

## 2016-02-03 NOTE — Progress Notes (Signed)
[facility] PROGRESS NOTE  Tanner Rice [id number] DOB: 04/02/1934 DOA: 01/28/2016 PCP: Mathews Argyle, MD   82/M with RA on remicaide and MTX admitted with ABd pain, found to have MRSA bacteremia Now having more low back pain, abd distension and upper abd pain  Assessment/Plan: 1. Sepsis/MRSA bacteremia: Initially presented as abdominal pain and fever and started on treatment for presumed diverticulitis. Patient's blood cultures returned positive 2 for MRSA.  -Continue Iv Vanc Day 5, repeat Blood Cx 8/23 with MRSA again -repeat Blood cultures from 8/24 -NGTD -Appreciate ID consult, had a rash on upper chest recently which resolved, wonder if that caused MRSA seeding -MRI L and T spine with T9 abscess and discitis/OM -TEE negative for endocarditis  2. T 8-9 Discitis and osteomyelitis with epidural abscess and cord Compression, cord signal normal -no neuro deficits from this at this time, appreciate NSG consult per Dr.Jones, due to considerable risks given age/co-morbidities, no plan for Surgery at this time, Abx recommended  3. Ileus: -very slow improvement, was NPO with NGT -had a BM 8/24 am, NG pulled out early 8/25 am by pt, NG output was very less and hence I didn't replace NG and started trial of clears, continue  Laxatives, tolerating clears-will not advance diet yet -recent CT unremarkable -KUB 8/22 with ileus, contrast seen in colon, hence no SBO  2. Hyponatremia:  -stable, now off IVF, improving  3. HTN:  -hold amlodipine and metoprolol -give IV BB and hydralazine PRN till ileus completely resolves  4. RA:  -Hold methotrexate -On Remicade  5. Left adrenal mass:  -Adrenal protocol CT as an outpatient once all acute issues resolved  DVt proph: lovenox  Code Status: FULL  Family Communication: wife at bedside Disposition Plan:  Tx to Tele today   Consultants:  ID  Procedures:  TEE - 02/02/16 - PENDING  Antibiotics:  Levaquin -  8/20>>>8/22  Flagyl - 8/20>>8/22  Vancomycin - 8/21 (Started by Pharmacy immediately after + [employer] noted) >>  HPI/Subjective: Back pain, no BM today, tolerating clears  Objective: Vitals:   02/03/16 1146 02/03/16 1313  BP: (!) 170/84 (!) 162/84  Pulse: 71 73  Resp:    Temp:      Intake/Output Summary (Last 24 hours) at 02/03/16 1325 Last data filed at 02/03/16 1147  Gross per 24 hour  Intake          1213.75 ml  Output             1400 ml  Net          -186.25 ml   Filed Weights   01/28/16 1659 01/28/16 1710 01/29/16 0145  Weight: 73.5 kg (162 lb) 73.5 kg (162 lb) 75.4 kg (166 lb 3.2 oz)    Exam:   General:  AAOx2, tired appearing  Cardiovascular: Regular rate and rhythm, 2/6 systolic murmur  Respiratory: Decreased breath sounds in bases with intermittent crackles. Normal effort  Abdomen:  Distended, but soft, + BS, non tender  Musculoskeletal: Normal tone bilaterally in upper and lower extremities, no bony abnormalities, moves all extremity coordinated fashion.   Ext: no edema  Data Reviewed: Basic Metabolic Panel:  Recent Labs Lab 01/30/16 1001 01/31/16 0623 02/01/16 0440 02/02/16 0935 02/03/16 0318  NA 128* 128* 128* 134* 133*  K 4.1 4.1 3.7 3.3* 3.1*  CL 100* 97* 99* 102 98*  CO2 22 25 23 25 26   GLUCOSE 101* 115* 117* 93 110*  BUN 24* 20 18 13 12   CREATININE 1.00 0.91  0.75 0.63 0.65  CALCIUM 8.6* 8.7* 8.3* 8.5* 8.9   Liver Function Tests:  Recent Labs Lab 01/28/16 1734 01/29/16 0405 01/30/16 1001 02/01/16 0440  AST 27 20 20 22   ALT 20 17 15* 17  ALKPHOS 73 57 60 56  BILITOT 0.9 1.3* 1.3* 1.5*  PROT 7.1 5.8* 6.0* 4.9*  ALBUMIN 4.2 3.3* 2.9* 2.2*    Recent Labs Lab 01/28/16 1734  LIPASE 18   No results for input(s): AMMONIA in the last 168 hours. CBC:  Recent Labs Lab 01/28/16 1734  01/30/16 1001 01/31/16 0623 02/01/16 0440 02/02/16 0935 02/03/16 0318  WBC 12.0*  < > 18.7* 13.4* 10.4 7.2 5.0  NEUTROABS 9.6*  --   --   --    --   --   --   HGB 14.0  < > 12.4* 12.2* 11.0* 11.7* 11.4*  HCT 39.8  < > 37.8* 37.1* 32.9* 35.6* 33.8*  MCV 93.4  < > 97.2 95.6 93.7 95.4 93.6  PLT 160  < > 129* 125* 134* 130* 123*  < > = values in this interval not displayed. Cardiac Enzymes:  Recent Labs Lab 01/28/16 1734  TROPONINI <0.03   BNP (last 3 results) No results for input(s): BNP in the last 8760 hours.  ProBNP (last 3 results) No results for input(s): PROBNP in the last 8760 hours.  CBG:  Recent Labs Lab 02/03/16 0746  GLUCAP 94    Recent Results (from the past 240 hour(s))  Culture, blood (routine x 2)     Status: Abnormal   Collection Time: 01/29/16  4:02 AM  Result Value Ref Range Status   Specimen Description BLOOD LEFT ANTECUBITAL  Final   Special Requests BOTTLES DRAWN AEROBIC AND ANAEROBIC 5CC   Final   Culture  Setup Time   Final    GRAM POSITIVE COCCI IN CLUSTERS IN BOTH AEROBIC AND ANAEROBIC BOTTLES Organism ID to follow CRITICAL RESULT CALLED TO, READ BACK BY AND VERIFIED WITH: M BELL 01/29/16 @ [place]    Culture METHICILLIN RESISTANT STAPHYLOCOCCUS AUREUS (A)  Final   Report Status 01/31/2016 FINAL  Final   Organism ID, Bacteria METHICILLIN RESISTANT STAPHYLOCOCCUS AUREUS  Final      Susceptibility   Methicillin resistant staphylococcus aureus - MIC*    CIPROFLOXACIN >=8 RESISTANT Resistant     ERYTHROMYCIN >=8 RESISTANT Resistant     GENTAMICIN <=0.5 SENSITIVE Sensitive     OXACILLIN >=4 RESISTANT Resistant     TETRACYCLINE <=1 SENSITIVE Sensitive     VANCOMYCIN 1 SENSITIVE Sensitive     TRIMETH/SULFA <=10 SENSITIVE Sensitive     CLINDAMYCIN <=0.25 SENSITIVE Sensitive     RIFAMPIN <=0.5 SENSITIVE Sensitive     Inducible Clindamycin NEGATIVE Sensitive     * METHICILLIN RESISTANT STAPHYLOCOCCUS AUREUS  Blood Culture ID Panel (Reflexed)     Status: Abnormal   Collection Time: 01/29/16  4:02 AM  Result Value Ref Range Status   Enterococcus species NOT DETECTED NOT DETECTED  Final   Vancomycin resistance NOT DETECTED NOT DETECTED Final   Listeria monocytogenes NOT DETECTED NOT DETECTED Final   Staphylococcus species DETECTED (A) NOT DETECTED Final    Comment: CRITICAL RESULT CALLED TO, READ BACK BY AND VERIFIED WITH: M BELL 01/29/16 @ 1929 M VESTAL    Staphylococcus aureus DETECTED (A) NOT DETECTED Final    Comment: CRITICAL RESULT CALLED TO, READ BACK BY AND VERIFIED WITH: M BELL 01/29/16 @ 1929 M VESTAL    Methicillin resistance DETECTED (A)  NOT DETECTED Final    Comment: CRITICAL RESULT CALLED TO, READ BACK BY AND VERIFIED WITH: M BELL 01/29/16 @ 1929 M VESTAL    Streptococcus species NOT DETECTED NOT DETECTED Final   Streptococcus agalactiae NOT DETECTED NOT DETECTED Final   Streptococcus pneumoniae NOT DETECTED NOT DETECTED Final   Streptococcus pyogenes NOT DETECTED NOT DETECTED Final   Acinetobacter baumannii NOT DETECTED NOT DETECTED Final   Enterobacteriaceae species NOT DETECTED NOT DETECTED Final   Enterobacter cloacae complex NOT DETECTED NOT DETECTED Final   Escherichia coli NOT DETECTED NOT DETECTED Final   Klebsiella oxytoca NOT DETECTED NOT DETECTED Final   Klebsiella pneumoniae NOT DETECTED NOT DETECTED Final   Proteus species NOT DETECTED NOT DETECTED Final   Serratia marcescens NOT DETECTED NOT DETECTED Final   Carbapenem resistance NOT DETECTED NOT DETECTED Final   Haemophilus influenzae NOT DETECTED NOT DETECTED Final   Neisseria meningitidis NOT DETECTED NOT DETECTED Final   Pseudomonas aeruginosa NOT DETECTED NOT DETECTED Final   Candida albicans NOT DETECTED NOT DETECTED Final   Candida glabrata NOT DETECTED NOT DETECTED Final   Candida krusei NOT DETECTED NOT DETECTED Final   Candida parapsilosis NOT DETECTED NOT DETECTED Final   Candida tropicalis NOT DETECTED NOT DETECTED Final  Culture, blood (routine x 2)     Status: Abnormal   Collection Time: 01/29/16  4:05 AM  Result Value Ref Range Status   Specimen Description BLOOD  LEFT ANTECUBITAL  Final   Special Requests BOTTLES DRAWN AEROBIC AND ANAEROBIC 5CC   Final   Culture  Setup Time   Final    GRAM POSITIVE COCCI IN CLUSTERS IN BOTH AEROBIC AND ANAEROBIC BOTTLES CRITICAL VALUE NOTED.  VALUE IS CONSISTENT WITH PREVIOUSLY REPORTED AND CALLED VALUE.    Culture (A)  Final    STAPHYLOCOCCUS AUREUS SUSCEPTIBILITIES PERFORMED ON PREVIOUS CULTURE WITHIN THE LAST 5 DAYS.    Report Status 01/31/2016 FINAL  Final  MRSA culture     Status: None   Collection Time: 01/30/16  1:16 PM  Result Value Ref Range Status   Specimen Description NASAL SWAB  Final   Special Requests NONE  Final   Culture NEGATIVE  Final   Report Status 01/31/2016 FINAL  Final  Culture, blood (routine x 2)     Status: Abnormal   Collection Time: 01/31/16  6:20 AM  Result Value Ref Range Status   Specimen Description BLOOD LEFT ANTECUBITAL  Final   Special Requests IN PEDIATRIC BOTTLE 3CC  Final   Culture  Setup Time   Final    GRAM POSITIVE COCCI IN CLUSTERS AEROBIC BOTTLE ONLY CRITICAL RESULT CALLED TO, READ BACK BY AND VERIFIED WITH: A MYER 01/31/16 @ 2119 M VESTAL    Culture (A)  Final    STAPHYLOCOCCUS AUREUS SUSCEPTIBILITIES PERFORMED ON PREVIOUS CULTURE WITHIN THE LAST 5 DAYS.    Report Status 02/02/2016 FINAL  Final  Culture, blood (routine x 2)     Status: Abnormal   Collection Time: 01/31/16  6:25 AM  Result Value Ref Range Status   Specimen Description BLOOD LEFT HAND  Final   Special Requests IN PEDIATRIC BOTTLE 1CC  Final   Culture  Setup Time   Final    GRAM POSITIVE COCCI IN CLUSTERS AEROBIC BOTTLE ONLY CRITICAL VALUE NOTED.  VALUE IS CONSISTENT WITH PREVIOUSLY REPORTED AND CALLED VALUE.    Culture (A)  Final    STAPHYLOCOCCUS AUREUS SUSCEPTIBILITIES PERFORMED ON PREVIOUS CULTURE WITHIN THE LAST 5 DAYS.    Report  Status 02/02/2016 FINAL  Final  Culture, blood (routine x 2)     Status: None (Preliminary result)   Collection Time: 02/01/16 10:48 AM  Result Value  Ref Range Status   Specimen Description BLOOD RIGHT ARM  Final   Special Requests   Final    BOTTLES DRAWN AEROBIC AND ANAEROBIC 10CC BLUE 5 CC RED   Culture NO GROWTH 1 DAY  Final   Report Status PENDING  Incomplete  Culture, blood (routine x 2)     Status: None (Preliminary result)   Collection Time: 02/01/16 10:55 AM  Result Value Ref Range Status   Specimen Description BLOOD LEFT HAND  Final   Special Requests IN PEDIATRIC BOTTLE 3CC  Final   Culture NO GROWTH 1 DAY  Final   Report Status PENDING  Incomplete     Studies: Mr Thoracic Spine W Wo Contrast  Result Date: 02/02/2016 CLINICAL DATA:  Severe back pain.  Blood cultures positive for MRSA EXAM: MRI THORACIC AND LUMBAR SPINE WITHOUT AND WITH CONTRAST TECHNIQUE: Multiplanar and multiecho pulse sequences of the thoracic and lumbar spine were obtained without and with intravenous contrast. CONTRAST:  69mL MULTIHANCE GADOBENATE DIMEGLUMINE 529 MG/ML IV SOLN COMPARISON:  CT abdomen pelvis 01/28/2016 FINDINGS: MRI THORACIC SPINE FINDINGS Alignment: Mild anterior listhesis T1-2 and T2-3 related to disc and facet degeneration. Remaining alignment normal. Vertebrae: Mild vertebral body edema at T8-9 with associated disc space changes compatible with discitis and osteomyelitis. Mild enhancement of the bone marrow at T8-9. Hemangioma T12 vertebral body. Hemangioma T4 vertebral body. 6 mm bone marrow lesion in the T6 vertebral body which shows mild enhancement. This may represent a hemangioma but it is not hyperintense on T1 prior to contrast. Cord: Cord compression at T8-9 related to epidural abscess. Cord signal normal. Paraspinal and other soft tissues: Discitis and osteomyelitis at T8-9. Ventral epidural abscess extends to the right of midline and is causing cord compression. Epidural abscess also extends anterior to the vertebral bodies into the paraspinous soft tissues. There is edema at in the paraspinous soft tissues anteriorly. Moderately  large pleural effusions bilaterally. Disc levels: Discitis and osteomyelitis at T8-9 with epidural abscess and cord compression. No other levels of infection identified. No fracture identified. MRI LUMBAR SPINE FINDINGS Segmentation:  Normal segmentation Alignment: Mild anterior slip at L4-5 and L5-S1. Mild retrolisthesis L2-3 and L3-4. Vertebrae: Negative for fracture or mass. Hemangioma T12 vertebral body. Mild enhancement around a Schmorl's node at L2-3. No evidence of discitis or osteomyelitis in the lumbar spine. Negative for metastatic disease. Conus medullaris: Extends to the T12-L1 level and appears normal. Paraspinal and other soft tissues: Paraspinous muscles are normal. No retroperitoneal mass or adenopathy or fluid collection. Disc levels: T12-L1:  Mild disc and mild facet degeneration L2-3: Mild disc and facet degeneration. Mild left foraminal narrowing. L2-3: Diffuse disc bulging and facet degeneration. Mild spinal stenosis. Mild left foraminal narrowing. L3-4: Disc degeneration with endplate osteophyte formation. Mild facet degeneration. Mild foraminal narrowing bilaterally. L4-5: Disc degeneration with diffuse disc bulging. Advanced facet arthrosis bilaterally with facet overgrowth and bilateral facet joint effusions. Moderate spinal stenosis. Moderate to severe subarticular and foraminal stenosis bilaterally right greater than left. L5-S1: Grade 1 anterior listhesis with advanced facet arthrosis. Mild foraminal encroachment bilaterally. IMPRESSION: Discitis and osteomyelitis at T8-9. Ventral epidural abscess extending to the right of midline causes spinal stenosis and cord compression. Cord signal normal. Abscess extends anterior to the disc space in the paraspinous soft tissues which are edematous. No other areas  of infection in the thoracic or lumbar spine. Lumbar degenerative changes as above without acute abnormality in the lumbar spine Moderately large bilateral pleural effusions. Critical  Value/emergent results were called by telephone at the time of interpretation on 02/02/2016 at 8:16 pm to Walden Field NP , who verbally acknowledged these results. Electronically Signed   By: Franchot Gallo M.D.   On: 02/02/2016 20:17   Mr Lumbar Spine W Wo Contrast  Result Date: 02/02/2016 CLINICAL DATA:  Severe back pain.  Blood cultures positive for MRSA EXAM: MRI THORACIC AND LUMBAR SPINE WITHOUT AND WITH CONTRAST TECHNIQUE: Multiplanar and multiecho pulse sequences of the thoracic and lumbar spine were obtained without and with intravenous contrast. CONTRAST:  55mL MULTIHANCE GADOBENATE DIMEGLUMINE 529 MG/ML IV SOLN COMPARISON:  CT abdomen pelvis 01/28/2016 FINDINGS: MRI THORACIC SPINE FINDINGS Alignment: Mild anterior listhesis T1-2 and T2-3 related to disc and facet degeneration. Remaining alignment normal. Vertebrae: Mild vertebral body edema at T8-9 with associated disc space changes compatible with discitis and osteomyelitis. Mild enhancement of the bone marrow at T8-9. Hemangioma T12 vertebral body. Hemangioma T4 vertebral body. 6 mm bone marrow lesion in the T6 vertebral body which shows mild enhancement. This may represent a hemangioma but it is not hyperintense on T1 prior to contrast. Cord: Cord compression at T8-9 related to epidural abscess. Cord signal normal. Paraspinal and other soft tissues: Discitis and osteomyelitis at T8-9. Ventral epidural abscess extends to the right of midline and is causing cord compression. Epidural abscess also extends anterior to the vertebral bodies into the paraspinous soft tissues. There is edema at in the paraspinous soft tissues anteriorly. Moderately large pleural effusions bilaterally. Disc levels: Discitis and osteomyelitis at T8-9 with epidural abscess and cord compression. No other levels of infection identified. No fracture identified. MRI LUMBAR SPINE FINDINGS Segmentation:  Normal segmentation Alignment: Mild anterior slip at L4-5 and L5-S1. Mild  retrolisthesis L2-3 and L3-4. Vertebrae: Negative for fracture or mass. Hemangioma T12 vertebral body. Mild enhancement around a Schmorl's node at L2-3. No evidence of discitis or osteomyelitis in the lumbar spine. Negative for metastatic disease. Conus medullaris: Extends to the T12-L1 level and appears normal. Paraspinal and other soft tissues: Paraspinous muscles are normal. No retroperitoneal mass or adenopathy or fluid collection. Disc levels: T12-L1:  Mild disc and mild facet degeneration L2-3: Mild disc and facet degeneration. Mild left foraminal narrowing. L2-3: Diffuse disc bulging and facet degeneration. Mild spinal stenosis. Mild left foraminal narrowing. L3-4: Disc degeneration with endplate osteophyte formation. Mild facet degeneration. Mild foraminal narrowing bilaterally. L4-5: Disc degeneration with diffuse disc bulging. Advanced facet arthrosis bilaterally with facet overgrowth and bilateral facet joint effusions. Moderate spinal stenosis. Moderate to severe subarticular and foraminal stenosis bilaterally right greater than left. L5-S1: Grade 1 anterior listhesis with advanced facet arthrosis. Mild foraminal encroachment bilaterally. IMPRESSION: Discitis and osteomyelitis at T8-9. Ventral epidural abscess extending to the right of midline causes spinal stenosis and cord compression. Cord signal normal. Abscess extends anterior to the disc space in the paraspinous soft tissues which are edematous. No other areas of infection in the thoracic or lumbar spine. Lumbar degenerative changes as above without acute abnormality in the lumbar spine Moderately large bilateral pleural effusions. Critical Value/emergent results were called by telephone at the time of interpretation on 02/02/2016 at 8:16 pm to Walden Field NP , who verbally acknowledged these results. Electronically Signed   By: Franchot Gallo M.D.   On: 02/02/2016 20:17   Dg Abd Portable 1v  Result Date: 02/02/2016  CLINICAL DATA:  Ileus.   Constipation.  Duration of symptoms 6 days. EXAM: PORTABLE ABDOMEN - 1 VIEW COMPARISON:  02/01/2016 FINDINGS: Gas throughout small and large bowel. Only a small amount of fecal matter noted in the colon. Findings could relate to mild ileus. No abnormal calcifications. Chronic degenerative changes of the spine. IMPRESSION: Probable mild ileus pattern.  No evidence of increased stool burden. Electronically Signed   By: Nelson Chimes M.D.   On: 02/02/2016 11:49    Scheduled Meds: . enoxaparin (LOVENOX) injection  40 mg Subcutaneous Q24H  . LORazepam  1 mg Intravenous Once  . metoprolol  5 mg Intravenous Q6H  . polyethylene glycol  17 g Oral Daily  . potassium chloride  40 mEq Oral Once  . tamsulosin  0.4 mg Oral QPC breakfast  . vancomycin  1,250 mg Intravenous Q12H  . vancomycin  1,500 mg Intravenous Once   Continuous Infusions:    Principal Problem:   MRSA bacteremia Active Problems:   Rheumatoid arthritis involving multiple sites with positive rheumatoid factor (HCC)   Essential hypertension   Hyponatremia   Left adrenal mass (HCC)   Epigastric pain   Hyperglycemia   Normocytic anemia   Thrombocytopenia (HCC)   Atherosclerotic peripheral vascular disease (HCC)   Degenerative joint disease of spine   BPH (benign prostatic hyperplasia)    Time spent: 59min    Hazelene Doten  [phone] [facility]  If 7PM-7AM, please contact night-coverage at [email or web address], password [facility] 02/03/2016, 1:25 PM  LOS: 5 days

## 2016-02-03 NOTE — Progress Notes (Signed)
Patient ID: Tanner Rice, male   DOB: November 24, 1933, 82 y.o.   MRN: [id number] Subjective: Patient reports to need moderately severe aching thoracic pain radiating into the abdomen. No leg pain. No numbness tingling or weakness.  Objective: Vital signs in last 24 hours: Temp:  [96.8 F (36 C)-99.9 F (37.7 C)] 98.3 F (36.8 C) (08/26 0750) Pulse Rate:  [62-155] 77 (08/26 0750) Resp:  [10-25] 14 (08/26 0750) BP: (131-183)/(70-104) 168/82 (08/26 0750) SpO2:  [91 %-98 %] 98 % (08/26 0750)  Intake/Output from previous day: 08/25 0701 - 08/26 0700 In: 1938.8 [P.O.:240; I.V.:798.8; IV Piggyback:900] Out: 1125 [Urine:1125] Intake/Output this shift: Total I/O In: -  Out: 150 [Urine:150]  Neurologic: Grossly normal. Moves legs well with good strength and normal sensation  Lab Results: Lab Results  Component Value Date   WBC 5.0 02/03/2016   HGB 11.4 (L) 02/03/2016   HCT 33.8 (L) 02/03/2016   MCV 93.6 02/03/2016   PLT 123 (L) 02/03/2016   No results found for: INR, PROTIME BMET Lab Results  Component Value Date   NA 133 (L) 02/03/2016   K 3.1 (L) 02/03/2016   CL 98 (L) 02/03/2016   CO2 26 02/03/2016   GLUCOSE 110 (H) 02/03/2016   BUN 12 02/03/2016   CREATININE 0.65 02/03/2016   CALCIUM 8.9 02/03/2016    Studies/Results: Mr Thoracic Spine W Wo Contrast  Result Date: 02/02/2016 CLINICAL DATA:  Severe back pain.  Blood cultures positive for MRSA EXAM: MRI THORACIC AND LUMBAR SPINE WITHOUT AND WITH CONTRAST TECHNIQUE: Multiplanar and multiecho pulse sequences of the thoracic and lumbar spine were obtained without and with intravenous contrast. CONTRAST:  35mL MULTIHANCE GADOBENATE DIMEGLUMINE 529 MG/ML IV SOLN COMPARISON:  CT abdomen pelvis 01/28/2016 FINDINGS: MRI THORACIC SPINE FINDINGS Alignment: Mild anterior listhesis T1-2 and T2-3 related to disc and facet degeneration. Remaining alignment normal. Vertebrae: Mild vertebral body edema at T8-9 with associated disc space changes  compatible with discitis and osteomyelitis. Mild enhancement of the bone marrow at T8-9. Hemangioma T12 vertebral body. Hemangioma T4 vertebral body. 6 mm bone marrow lesion in the T6 vertebral body which shows mild enhancement. This may represent a hemangioma but it is not hyperintense on T1 prior to contrast. Cord: Cord compression at T8-9 related to epidural abscess. Cord signal normal. Paraspinal and other soft tissues: Discitis and osteomyelitis at T8-9. Ventral epidural abscess extends to the right of midline and is causing cord compression. Epidural abscess also extends anterior to the vertebral bodies into the paraspinous soft tissues. There is edema at in the paraspinous soft tissues anteriorly. Moderately large pleural effusions bilaterally. Disc levels: Discitis and osteomyelitis at T8-9 with epidural abscess and cord compression. No other levels of infection identified. No fracture identified. MRI LUMBAR SPINE FINDINGS Segmentation:  Normal segmentation Alignment: Mild anterior slip at L4-5 and L5-S1. Mild retrolisthesis L2-3 and L3-4. Vertebrae: Negative for fracture or mass. Hemangioma T12 vertebral body. Mild enhancement around a Schmorl's node at L2-3. No evidence of discitis or osteomyelitis in the lumbar spine. Negative for metastatic disease. Conus medullaris: Extends to the T12-L1 level and appears normal. Paraspinal and other soft tissues: Paraspinous muscles are normal. No retroperitoneal mass or adenopathy or fluid collection. Disc levels: T12-L1:  Mild disc and mild facet degeneration L2-3: Mild disc and facet degeneration. Mild left foraminal narrowing. L2-3: Diffuse disc bulging and facet degeneration. Mild spinal stenosis. Mild left foraminal narrowing. L3-4: Disc degeneration with endplate osteophyte formation. Mild facet degeneration. Mild foraminal narrowing bilaterally. L4-5: Disc degeneration  with diffuse disc bulging. Advanced facet arthrosis bilaterally with facet overgrowth and  bilateral facet joint effusions. Moderate spinal stenosis. Moderate to severe subarticular and foraminal stenosis bilaterally right greater than left. L5-S1: Grade 1 anterior listhesis with advanced facet arthrosis. Mild foraminal encroachment bilaterally. IMPRESSION: Discitis and osteomyelitis at T8-9. Ventral epidural abscess extending to the right of midline causes spinal stenosis and cord compression. Cord signal normal. Abscess extends anterior to the disc space in the paraspinous soft tissues which are edematous. No other areas of infection in the thoracic or lumbar spine. Lumbar degenerative changes as above without acute abnormality in the lumbar spine Moderately large bilateral pleural effusions. Critical Value/emergent results were called by telephone at the time of interpretation on 02/02/2016 at 8:16 pm to Walden Field NP , who verbally acknowledged these results. Electronically Signed   By: Franchot Gallo M.D.   On: 02/02/2016 20:17   Mr Lumbar Spine W Wo Contrast  Result Date: 02/02/2016 CLINICAL DATA:  Severe back pain.  Blood cultures positive for MRSA EXAM: MRI THORACIC AND LUMBAR SPINE WITHOUT AND WITH CONTRAST TECHNIQUE: Multiplanar and multiecho pulse sequences of the thoracic and lumbar spine were obtained without and with intravenous contrast. CONTRAST:  53mL MULTIHANCE GADOBENATE DIMEGLUMINE 529 MG/ML IV SOLN COMPARISON:  CT abdomen pelvis 01/28/2016 FINDINGS: MRI THORACIC SPINE FINDINGS Alignment: Mild anterior listhesis T1-2 and T2-3 related to disc and facet degeneration. Remaining alignment normal. Vertebrae: Mild vertebral body edema at T8-9 with associated disc space changes compatible with discitis and osteomyelitis. Mild enhancement of the bone marrow at T8-9. Hemangioma T12 vertebral body. Hemangioma T4 vertebral body. 6 mm bone marrow lesion in the T6 vertebral body which shows mild enhancement. This may represent a hemangioma but it is not hyperintense on T1 prior to contrast.  Cord: Cord compression at T8-9 related to epidural abscess. Cord signal normal. Paraspinal and other soft tissues: Discitis and osteomyelitis at T8-9. Ventral epidural abscess extends to the right of midline and is causing cord compression. Epidural abscess also extends anterior to the vertebral bodies into the paraspinous soft tissues. There is edema at in the paraspinous soft tissues anteriorly. Moderately large pleural effusions bilaterally. Disc levels: Discitis and osteomyelitis at T8-9 with epidural abscess and cord compression. No other levels of infection identified. No fracture identified. MRI LUMBAR SPINE FINDINGS Segmentation:  Normal segmentation Alignment: Mild anterior slip at L4-5 and L5-S1. Mild retrolisthesis L2-3 and L3-4. Vertebrae: Negative for fracture or mass. Hemangioma T12 vertebral body. Mild enhancement around a Schmorl's node at L2-3. No evidence of discitis or osteomyelitis in the lumbar spine. Negative for metastatic disease. Conus medullaris: Extends to the T12-L1 level and appears normal. Paraspinal and other soft tissues: Paraspinous muscles are normal. No retroperitoneal mass or adenopathy or fluid collection. Disc levels: T12-L1:  Mild disc and mild facet degeneration L2-3: Mild disc and facet degeneration. Mild left foraminal narrowing. L2-3: Diffuse disc bulging and facet degeneration. Mild spinal stenosis. Mild left foraminal narrowing. L3-4: Disc degeneration with endplate osteophyte formation. Mild facet degeneration. Mild foraminal narrowing bilaterally. L4-5: Disc degeneration with diffuse disc bulging. Advanced facet arthrosis bilaterally with facet overgrowth and bilateral facet joint effusions. Moderate spinal stenosis. Moderate to severe subarticular and foraminal stenosis bilaterally right greater than left. L5-S1: Grade 1 anterior listhesis with advanced facet arthrosis. Mild foraminal encroachment bilaterally. IMPRESSION: Discitis and osteomyelitis at T8-9. Ventral  epidural abscess extending to the right of midline causes spinal stenosis and cord compression. Cord signal normal. Abscess extends anterior to the disc space  in the paraspinous soft tissues which are edematous. No other areas of infection in the thoracic or lumbar spine. Lumbar degenerative changes as above without acute abnormality in the lumbar spine Moderately large bilateral pleural effusions. Critical Value/emergent results were called by telephone at the time of interpretation on 02/02/2016 at 8:16 pm to Walden Field NP , who verbally acknowledged these results. Electronically Signed   By: Franchot Gallo M.D.   On: 02/02/2016 20:17   Dg Abd Portable 1v  Result Date: 02/02/2016 CLINICAL DATA:  Ileus.  Constipation.  Duration of symptoms 6 days. EXAM: PORTABLE ABDOMEN - 1 VIEW COMPARISON:  02/01/2016 FINDINGS: Gas throughout small and large bowel. Only a small amount of fecal matter noted in the colon. Findings could relate to mild ileus. No abnormal calcifications. Chronic degenerative changes of the spine. IMPRESSION: Probable mild ileus pattern.  No evidence of increased stool burden. Electronically Signed   By: Nelson Chimes M.D.   On: 02/02/2016 11:49    Assessment/Plan: No change in physical exam. I have spoken at length to his son. I have explained my plan. He is a patient of Dr. Annette Stable who saw him 3 months ago for neck pain. I will inform Dr. pool of his admission on Monday. Continue vancomycin. I still feel surgery is not absolutely necessary at present and probably carries more risk than potential benefit.   LOS: 5 days    Nyala Kirchner S 02/03/2016, 9:50 AM

## 2016-02-04 DIAGNOSIS — K567 Ileus, unspecified: Secondary | ICD-10-CM

## 2016-02-04 LAB — BASIC METABOLIC PANEL
ANION GAP: 7 (ref 5–15)
BUN: 11 mg/dL (ref 6–20)
CHLORIDE: 96 mmol/L — AB (ref 101–111)
CO2: 30 mmol/L (ref 22–32)
CREATININE: 0.59 mg/dL — AB (ref 0.61–1.24)
Calcium: 8.9 mg/dL (ref 8.9–10.3)
GFR calc Af Amer: >60 mL/min (ref >60)
GFR calc non Af Amer: >60 mL/min (ref >60)
Glucose, Bld: 128 mg/dL — ABNORMAL HIGH (ref 65–99)
POTASSIUM: 3.1 mmol/L — AB (ref 3.5–5.1)
Sodium: 133 mmol/L — ABNORMAL LOW (ref 135–145)

## 2016-02-04 LAB — CBC
HEMATOCRIT: 33.5 % — AB (ref 39.0–52.0)
Hemoglobin: 11.3 g/dL — ABNORMAL LOW (ref 13.0–17.0)
MCH: 31.4 pg (ref 26.0–34.0)
MCHC: 33.7 g/dL (ref 30.0–36.0)
MCV: 93.1 fL (ref 78.0–100.0)
PLATELETS: 137 10*3/uL — AB (ref 150–400)
RBC: 3.6 MIL/uL — AB (ref 4.22–5.81)
RDW: 12.7 % (ref 11.5–15.5)
WBC: 8.9 10*3/uL (ref 4.0–10.5)

## 2016-02-04 MED ORDER — POLYETHYLENE GLYCOL 3350 17 G PO PACK
17.0000 g | PACK | Freq: Two times a day (BID) | ORAL | Status: DC
  Administered 2016-02-04 – 2016-02-07 (×6): 17 g via ORAL
  Filled 2016-02-04 (×6): qty 1

## 2016-02-04 MED ORDER — POTASSIUM CHLORIDE 20 MEQ/15ML (10%) PO SOLN
40.0000 meq | Freq: Once | ORAL | Status: AC
  Administered 2016-02-04: 40 meq via ORAL
  Filled 2016-02-04: qty 30

## 2016-02-04 MED ORDER — SENNOSIDES-DOCUSATE SODIUM 8.6-50 MG PO TABS
1.0000 | ORAL_TABLET | Freq: Two times a day (BID) | ORAL | Status: DC
  Administered 2016-02-04 – 2016-02-07 (×7): 1 via ORAL
  Filled 2016-02-04 (×6): qty 1

## 2016-02-04 NOTE — Progress Notes (Signed)
Patient ID: Tanner Rice, male   DOB: 1933-11-29, 82 y.o.   MRN: [id number] Subjective: Patient reports continued significant thoracic pain with some radiation around the rib cage. No leg symptoms. No numbness tingling or weakness. He stands to urinate but doesn't walk much because the pain increases with standing.  Objective: Vital signs in last 24 hours: Temp:  [97.9 F (36.6 C)-99.2 F (37.3 C)] 97.9 F (36.6 C) (08/27 0545) Pulse Rate:  [71-92] 74 (08/27 0545) Resp:  [13-22] 22 (08/26 2145) BP: (159-194)/(77-93) 165/83 (08/27 0545) SpO2:  [92 %-96 %] 96 % (08/27 0545) Weight:  [76.3 kg (168 lb 3.2 oz)] 76.3 kg (168 lb 3.2 oz) (08/26 1424)  Intake/Output from previous day: 08/26 0701 - 08/27 0700 In: 990 [P.O.:240; IV Piggyback:750] Out: 750 [Urine:750] Intake/Output this shift: Total I/O In: 350 [P.O.:350] Out: 400 [Urine:400]  No change in exam. Moves legs well with good strength and power.  Lab Results: Lab Results  Component Value Date   WBC 8.9 02/04/2016   HGB 11.3 (L) 02/04/2016   HCT 33.5 (L) 02/04/2016   MCV 93.1 02/04/2016   PLT 137 (L) 02/04/2016   No results found for: INR, PROTIME BMET Lab Results  Component Value Date   NA 133 (L) 02/04/2016   K 3.1 (L) 02/04/2016   CL 96 (L) 02/04/2016   CO2 30 02/04/2016   GLUCOSE 128 (H) 02/04/2016   BUN 11 02/04/2016   CREATININE 0.59 (L) 02/04/2016   CALCIUM 8.9 02/04/2016    Studies/Results: Mr Thoracic Spine W Wo Contrast  Result Date: 02/02/2016 CLINICAL DATA:  Severe back pain.  Blood cultures positive for MRSA EXAM: MRI THORACIC AND LUMBAR SPINE WITHOUT AND WITH CONTRAST TECHNIQUE: Multiplanar and multiecho pulse sequences of the thoracic and lumbar spine were obtained without and with intravenous contrast. CONTRAST:  74mL MULTIHANCE GADOBENATE DIMEGLUMINE 529 MG/ML IV SOLN COMPARISON:  CT abdomen pelvis 01/28/2016 FINDINGS: MRI THORACIC SPINE FINDINGS Alignment: Mild anterior listhesis T1-2 and T2-3  related to disc and facet degeneration. Remaining alignment normal. Vertebrae: Mild vertebral body edema at T8-9 with associated disc space changes compatible with discitis and osteomyelitis. Mild enhancement of the bone marrow at T8-9. Hemangioma T12 vertebral body. Hemangioma T4 vertebral body. 6 mm bone marrow lesion in the T6 vertebral body which shows mild enhancement. This may represent a hemangioma but it is not hyperintense on T1 prior to contrast. Cord: Cord compression at T8-9 related to epidural abscess. Cord signal normal. Paraspinal and other soft tissues: Discitis and osteomyelitis at T8-9. Ventral epidural abscess extends to the right of midline and is causing cord compression. Epidural abscess also extends anterior to the vertebral bodies into the paraspinous soft tissues. There is edema at in the paraspinous soft tissues anteriorly. Moderately large pleural effusions bilaterally. Disc levels: Discitis and osteomyelitis at T8-9 with epidural abscess and cord compression. No other levels of infection identified. No fracture identified. MRI LUMBAR SPINE FINDINGS Segmentation:  Normal segmentation Alignment: Mild anterior slip at L4-5 and L5-S1. Mild retrolisthesis L2-3 and L3-4. Vertebrae: Negative for fracture or mass. Hemangioma T12 vertebral body. Mild enhancement around a Schmorl's node at L2-3. No evidence of discitis or osteomyelitis in the lumbar spine. Negative for metastatic disease. Conus medullaris: Extends to the T12-L1 level and appears normal. Paraspinal and other soft tissues: Paraspinous muscles are normal. No retroperitoneal mass or adenopathy or fluid collection. Disc levels: T12-L1:  Mild disc and mild facet degeneration L2-3: Mild disc and facet degeneration. Mild left foraminal narrowing. L2-3:  Diffuse disc bulging and facet degeneration. Mild spinal stenosis. Mild left foraminal narrowing. L3-4: Disc degeneration with endplate osteophyte formation. Mild facet degeneration. Mild  foraminal narrowing bilaterally. L4-5: Disc degeneration with diffuse disc bulging. Advanced facet arthrosis bilaterally with facet overgrowth and bilateral facet joint effusions. Moderate spinal stenosis. Moderate to severe subarticular and foraminal stenosis bilaterally right greater than left. L5-S1: Grade 1 anterior listhesis with advanced facet arthrosis. Mild foraminal encroachment bilaterally. IMPRESSION: Discitis and osteomyelitis at T8-9. Ventral epidural abscess extending to the right of midline causes spinal stenosis and cord compression. Cord signal normal. Abscess extends anterior to the disc space in the paraspinous soft tissues which are edematous. No other areas of infection in the thoracic or lumbar spine. Lumbar degenerative changes as above without acute abnormality in the lumbar spine Moderately large bilateral pleural effusions. Critical Value/emergent results were called by telephone at the time of interpretation on 02/02/2016 at 8:16 pm to Walden Field NP , who verbally acknowledged these results. Electronically Signed   By: Franchot Gallo M.D.   On: 02/02/2016 20:17   Mr Lumbar Spine W Wo Contrast  Result Date: 02/02/2016 CLINICAL DATA:  Severe back pain.  Blood cultures positive for MRSA EXAM: MRI THORACIC AND LUMBAR SPINE WITHOUT AND WITH CONTRAST TECHNIQUE: Multiplanar and multiecho pulse sequences of the thoracic and lumbar spine were obtained without and with intravenous contrast. CONTRAST:  25mL MULTIHANCE GADOBENATE DIMEGLUMINE 529 MG/ML IV SOLN COMPARISON:  CT abdomen pelvis 01/28/2016 FINDINGS: MRI THORACIC SPINE FINDINGS Alignment: Mild anterior listhesis T1-2 and T2-3 related to disc and facet degeneration. Remaining alignment normal. Vertebrae: Mild vertebral body edema at T8-9 with associated disc space changes compatible with discitis and osteomyelitis. Mild enhancement of the bone marrow at T8-9. Hemangioma T12 vertebral body. Hemangioma T4 vertebral body. 6 mm bone marrow  lesion in the T6 vertebral body which shows mild enhancement. This may represent a hemangioma but it is not hyperintense on T1 prior to contrast. Cord: Cord compression at T8-9 related to epidural abscess. Cord signal normal. Paraspinal and other soft tissues: Discitis and osteomyelitis at T8-9. Ventral epidural abscess extends to the right of midline and is causing cord compression. Epidural abscess also extends anterior to the vertebral bodies into the paraspinous soft tissues. There is edema at in the paraspinous soft tissues anteriorly. Moderately large pleural effusions bilaterally. Disc levels: Discitis and osteomyelitis at T8-9 with epidural abscess and cord compression. No other levels of infection identified. No fracture identified. MRI LUMBAR SPINE FINDINGS Segmentation:  Normal segmentation Alignment: Mild anterior slip at L4-5 and L5-S1. Mild retrolisthesis L2-3 and L3-4. Vertebrae: Negative for fracture or mass. Hemangioma T12 vertebral body. Mild enhancement around a Schmorl's node at L2-3. No evidence of discitis or osteomyelitis in the lumbar spine. Negative for metastatic disease. Conus medullaris: Extends to the T12-L1 level and appears normal. Paraspinal and other soft tissues: Paraspinous muscles are normal. No retroperitoneal mass or adenopathy or fluid collection. Disc levels: T12-L1:  Mild disc and mild facet degeneration L2-3: Mild disc and facet degeneration. Mild left foraminal narrowing. L2-3: Diffuse disc bulging and facet degeneration. Mild spinal stenosis. Mild left foraminal narrowing. L3-4: Disc degeneration with endplate osteophyte formation. Mild facet degeneration. Mild foraminal narrowing bilaterally. L4-5: Disc degeneration with diffuse disc bulging. Advanced facet arthrosis bilaterally with facet overgrowth and bilateral facet joint effusions. Moderate spinal stenosis. Moderate to severe subarticular and foraminal stenosis bilaterally right greater than left. L5-S1: Grade 1  anterior listhesis with advanced facet arthrosis. Mild foraminal encroachment bilaterally. IMPRESSION:  Discitis and osteomyelitis at T8-9. Ventral epidural abscess extending to the right of midline causes spinal stenosis and cord compression. Cord signal normal. Abscess extends anterior to the disc space in the paraspinous soft tissues which are edematous. No other areas of infection in the thoracic or lumbar spine. Lumbar degenerative changes as above without acute abnormality in the lumbar spine Moderately large bilateral pleural effusions. Critical Value/emergent results were called by telephone at the time of interpretation on 02/02/2016 at 8:16 pm to Walden Field NP , who verbally acknowledged these results. Electronically Signed   By: Franchot Gallo M.D.   On: 02/02/2016 20:17   Dg Abd Portable 1v  Result Date: 02/02/2016 CLINICAL DATA:  Ileus.  Constipation.  Duration of symptoms 6 days. EXAM: PORTABLE ABDOMEN - 1 VIEW COMPARISON:  02/01/2016 FINDINGS: Gas throughout small and large bowel. Only a small amount of fecal matter noted in the colon. Findings could relate to mild ileus. No abnormal calcifications. Chronic degenerative changes of the spine. IMPRESSION: Probable mild ileus pattern.  No evidence of increased stool burden. Electronically Signed   By: Nelson Chimes M.D.   On: 02/02/2016 11:49    Assessment/Plan: Overall stable from a neurologic standpoint. Continues to complain of back pain. Offered a TLSO brace which may offer some external support and therefore reduce pain and improve function. He wants to consider that.  They have seen Dr. Annette Stable in the past for his neck and therefore I will inform Dr. Annette Stable of this admission and he will assume his care tomorrow.  At this point I still hesitate to offer any surgical intervention as I'm not sure that I see benefits that would outweigh the potential risks   LOS: 6 days    JONES,DAVID S 02/04/2016, 10:56 AM

## 2016-02-04 NOTE — Progress Notes (Addendum)
[facility] PROGRESS NOTE  Tanner Rice [id number] DOB: 1934-04-24 DOA: 01/28/2016 PCP: Mathews Argyle, MD   82/M with RA on remicaide and MTX admitted with ABd pain, found to have MRSA bacteremia Subsequently found to have Ileus and T8-9 discitis and epidural abscess  Assessment/Plan: 1. Sepsis/MRSA bacteremia: Initially presented as abdominal pain and fever and started on treatment for presumed diverticulitis. Patient's blood cultures returned positive 2 for MRSA.  -Continue Iv Vanc Day 5, repeat Blood Cx 8/23 with MRSA again -repeat Blood cultures from 8/24 -NGTD -Appreciate ID consult, had a rash on upper chest recently which resolved, wonder if that caused MRSA seeding -MRI L and T spine with T9 abscess and discitis/OM -TEE negative for endocarditis  2. T 8-9 Discitis and osteomyelitis with epidural abscess and cord Compression, cord signal normal -no neuro deficits from this at this time, appreciate NSG consult per Dr.Jones, due to considerable risks given age/co-morbidities, no plan for Surgery at this time, Abx recommended  3. Ileus: -very slow improvement, was NPO with NGT -had a BM 8/24 am, NG pulled out early 8/25 am by pt, NG output was very less and hence I didn't replace NG and started trial of clears, continue  Laxatives, tolerating clears-will not advance diet yet-hopefully tomorrow if +BMs -recent CT unremarkable -KUB 8/22 with ileus, contrast seen in colon, hence no SBO  2. Hyponatremia:  -stable, now off IVF, improving  3. HTN:  -hold amlodipine and metoprolol -give IV BB and hydralazine PRN till ileus completely resolves  4. RA:  -Hold methotrexate -On Remicade  5. Left adrenal mass:  -Adrenal protocol CT as an outpatient once all acute issues resolved  DVt proph: lovenox  Code Status: FULL  Family Communication: wife at bedside Disposition Plan:  SNF when more stable and ileus improved   Consultants:  ID  Procedures:  TEE  - 02/02/16 - PENDING  Antibiotics:  Levaquin - 8/20>>>8/22  Flagyl - 8/20>>8/22  Vancomycin - 8/21 (Started by Pharmacy immediately after + [place] noted) >>  HPI/Subjective: Back pain, no BM today, tolerating clears, better overall  Objective: Vitals:   02/04/16 0237 02/04/16 0545  BP: (!) 194/80 (!) 165/83  Pulse: 90 74  Resp:    Temp:  97.9 F (36.6 C)    Intake/Output Summary (Last 24 hours) at 02/04/16 1229 Last data filed at 02/04/16 1030  Gross per 24 hour  Intake             1340 ml  Output              950 ml  Net              390 ml   Filed Weights   01/28/16 1710 01/29/16 0145 02/03/16 1424  Weight: 73.5 kg (162 lb) 75.4 kg (166 lb 3.2 oz) 76.3 kg (168 lb 3.2 oz)    Exam:   General:  AAOx2, tired appearing  Cardiovascular: Regular rate and rhythm, 2/6 systolic murmur  Respiratory: Decreased breath sounds in bases with intermittent crackles. Normal effort  Abdomen:  Distended, but soft, + BS, non tender  Musculoskeletal: Normal tone bilaterally in upper and lower extremities, no bony abnormalities, moves all extremity coordinated fashion.   Ext: no edema  Data Reviewed: Basic Metabolic Panel:  Recent Labs Lab 01/31/16 0623 02/01/16 0440 02/02/16 0935 02/03/16 0318 02/04/16 0544  NA 128* 128* 134* 133* 133*  K 4.1 3.7 3.3* 3.1* 3.1*  CL 97* 99* 102 98* 96*  CO2 25 23 25  26 30  GLUCOSE 115* 117* 93 110* 128*  BUN 20 18 13 12 11   CREATININE 0.91 0.75 0.63 0.65 0.59*  CALCIUM 8.7* 8.3* 8.5* 8.9 8.9   Liver Function Tests:  Recent Labs Lab 01/28/16 1734 01/29/16 0405 01/30/16 1001 02/01/16 0440  AST 27 20 20 22   ALT 20 17 15* 17  ALKPHOS 73 57 60 56  BILITOT 0.9 1.3* 1.3* 1.5*  PROT 7.1 5.8* 6.0* 4.9*  ALBUMIN 4.2 3.3* 2.9* 2.2*    Recent Labs Lab 01/28/16 1734  LIPASE 18   No results for input(s): AMMONIA in the last 168 hours. CBC:  Recent Labs Lab 01/28/16 1734  01/31/16 0623 02/01/16 0440 02/02/16 0935  02/03/16 0318 02/04/16 0544  WBC 12.0*  < > 13.4* 10.4 7.2 5.0 8.9  NEUTROABS 9.6*  --   --   --   --   --   --   HGB 14.0  < > 12.2* 11.0* 11.7* 11.4* 11.3*  HCT 39.8  < > 37.1* 32.9* 35.6* 33.8* 33.5*  MCV 93.4  < > 95.6 93.7 95.4 93.6 93.1  PLT 160  < > 125* 134* 130* 123* 137*  < > = values in this interval not displayed. Cardiac Enzymes:  Recent Labs Lab 01/28/16 1734  TROPONINI <0.03   BNP (last 3 results) No results for input(s): BNP in the last 8760 hours.  ProBNP (last 3 results) No results for input(s): PROBNP in the last 8760 hours.  CBG:  Recent Labs Lab 02/03/16 0746  GLUCAP 94    Recent Results (from the past 240 hour(s))  Culture, blood (routine x 2)     Status: Abnormal   Collection Time: 01/29/16  4:02 AM  Result Value Ref Range Status   Specimen Description BLOOD LEFT ANTECUBITAL  Final   Special Requests BOTTLES DRAWN AEROBIC AND ANAEROBIC 5CC   Final   Culture  Setup Time   Final    GRAM POSITIVE COCCI IN CLUSTERS IN BOTH AEROBIC AND ANAEROBIC BOTTLES Organism ID to follow CRITICAL RESULT CALLED TO, READ BACK BY AND VERIFIED WITH: M BELL 01/29/16 @ [place]    Culture METHICILLIN RESISTANT STAPHYLOCOCCUS AUREUS (A)  Final   Report Status 01/31/2016 FINAL  Final   Organism ID, Bacteria METHICILLIN RESISTANT STAPHYLOCOCCUS AUREUS  Final      Susceptibility   Methicillin resistant staphylococcus aureus - MIC*    CIPROFLOXACIN >=8 RESISTANT Resistant     ERYTHROMYCIN >=8 RESISTANT Resistant     GENTAMICIN <=0.5 SENSITIVE Sensitive     OXACILLIN >=4 RESISTANT Resistant     TETRACYCLINE <=1 SENSITIVE Sensitive     VANCOMYCIN 1 SENSITIVE Sensitive     TRIMETH/SULFA <=10 SENSITIVE Sensitive     CLINDAMYCIN <=0.25 SENSITIVE Sensitive     RIFAMPIN <=0.5 SENSITIVE Sensitive     Inducible Clindamycin NEGATIVE Sensitive     * METHICILLIN RESISTANT STAPHYLOCOCCUS AUREUS  Blood Culture ID Panel (Reflexed)     Status: Abnormal   Collection Time:  01/29/16  4:02 AM  Result Value Ref Range Status   Enterococcus species NOT DETECTED NOT DETECTED Final   Vancomycin resistance NOT DETECTED NOT DETECTED Final   Listeria monocytogenes NOT DETECTED NOT DETECTED Final   Staphylococcus species DETECTED (A) NOT DETECTED Final    Comment: CRITICAL RESULT CALLED TO, READ BACK BY AND VERIFIED WITH: M BELL 01/29/16 @ 1929 M VESTAL    Staphylococcus aureus DETECTED (A) NOT DETECTED Final    Comment: CRITICAL RESULT CALLED TO,  READ BACK BY AND VERIFIED WITH: M BELL 01/29/16 @ 1929 M VESTAL    Methicillin resistance DETECTED (A) NOT DETECTED Final    Comment: CRITICAL RESULT CALLED TO, READ BACK BY AND VERIFIED WITH: M BELL 01/29/16 @ 1929 M VESTAL    Streptococcus species NOT DETECTED NOT DETECTED Final   Streptococcus agalactiae NOT DETECTED NOT DETECTED Final   Streptococcus pneumoniae NOT DETECTED NOT DETECTED Final   Streptococcus pyogenes NOT DETECTED NOT DETECTED Final   Acinetobacter baumannii NOT DETECTED NOT DETECTED Final   Enterobacteriaceae species NOT DETECTED NOT DETECTED Final   Enterobacter cloacae complex NOT DETECTED NOT DETECTED Final   Escherichia coli NOT DETECTED NOT DETECTED Final   Klebsiella oxytoca NOT DETECTED NOT DETECTED Final   Klebsiella pneumoniae NOT DETECTED NOT DETECTED Final   Proteus species NOT DETECTED NOT DETECTED Final   Serratia marcescens NOT DETECTED NOT DETECTED Final   Carbapenem resistance NOT DETECTED NOT DETECTED Final   Haemophilus influenzae NOT DETECTED NOT DETECTED Final   Neisseria meningitidis NOT DETECTED NOT DETECTED Final   Pseudomonas aeruginosa NOT DETECTED NOT DETECTED Final   Candida albicans NOT DETECTED NOT DETECTED Final   Candida glabrata NOT DETECTED NOT DETECTED Final   Candida krusei NOT DETECTED NOT DETECTED Final   Candida parapsilosis NOT DETECTED NOT DETECTED Final   Candida tropicalis NOT DETECTED NOT DETECTED Final  Culture, blood (routine x 2)     Status: Abnormal    Collection Time: 01/29/16  4:05 AM  Result Value Ref Range Status   Specimen Description BLOOD LEFT ANTECUBITAL  Final   Special Requests BOTTLES DRAWN AEROBIC AND ANAEROBIC 5CC   Final   Culture  Setup Time   Final    GRAM POSITIVE COCCI IN CLUSTERS IN BOTH AEROBIC AND ANAEROBIC BOTTLES CRITICAL VALUE NOTED.  VALUE IS CONSISTENT WITH PREVIOUSLY REPORTED AND CALLED VALUE.    Culture (A)  Final    STAPHYLOCOCCUS AUREUS SUSCEPTIBILITIES PERFORMED ON PREVIOUS CULTURE WITHIN THE LAST 5 DAYS.    Report Status 01/31/2016 FINAL  Final  MRSA culture     Status: None   Collection Time: 01/30/16  1:16 PM  Result Value Ref Range Status   Specimen Description NASAL SWAB  Final   Special Requests NONE  Final   Culture NEGATIVE  Final   Report Status 01/31/2016 FINAL  Final  Culture, blood (routine x 2)     Status: Abnormal   Collection Time: 01/31/16  6:20 AM  Result Value Ref Range Status   Specimen Description BLOOD LEFT ANTECUBITAL  Final   Special Requests IN PEDIATRIC BOTTLE 3CC  Final   Culture  Setup Time   Final    GRAM POSITIVE COCCI IN CLUSTERS AEROBIC BOTTLE ONLY CRITICAL RESULT CALLED TO, READ BACK BY AND VERIFIED WITH: A MYER 01/31/16 @ 2119 M VESTAL    Culture (A)  Final    STAPHYLOCOCCUS AUREUS SUSCEPTIBILITIES PERFORMED ON PREVIOUS CULTURE WITHIN THE LAST 5 DAYS.    Report Status 02/02/2016 FINAL  Final  Culture, blood (routine x 2)     Status: Abnormal   Collection Time: 01/31/16  6:25 AM  Result Value Ref Range Status   Specimen Description BLOOD LEFT HAND  Final   Special Requests IN PEDIATRIC BOTTLE 1CC  Final   Culture  Setup Time   Final    GRAM POSITIVE COCCI IN CLUSTERS AEROBIC BOTTLE ONLY CRITICAL VALUE NOTED.  VALUE IS CONSISTENT WITH PREVIOUSLY REPORTED AND CALLED VALUE.    Culture (A)  Final    STAPHYLOCOCCUS AUREUS SUSCEPTIBILITIES PERFORMED ON PREVIOUS CULTURE WITHIN THE LAST 5 DAYS.    Report Status 02/02/2016 FINAL  Final  Culture, blood  (routine x 2)     Status: None (Preliminary result)   Collection Time: 02/01/16 10:48 AM  Result Value Ref Range Status   Specimen Description BLOOD RIGHT ARM  Final   Special Requests   Final    BOTTLES DRAWN AEROBIC AND ANAEROBIC 10CC BLUE 5 CC RED   Culture NO GROWTH 3 DAYS  Final   Report Status PENDING  Incomplete  Culture, blood (routine x 2)     Status: None (Preliminary result)   Collection Time: 02/01/16 10:55 AM  Result Value Ref Range Status   Specimen Description BLOOD LEFT HAND  Final   Special Requests IN PEDIATRIC BOTTLE 3CC  Final   Culture NO GROWTH 3 DAYS  Final   Report Status PENDING  Incomplete     Studies: Mr Thoracic Spine W Wo Contrast  Result Date: 02/02/2016 CLINICAL DATA:  Severe back pain.  Blood cultures positive for MRSA EXAM: MRI THORACIC AND LUMBAR SPINE WITHOUT AND WITH CONTRAST TECHNIQUE: Multiplanar and multiecho pulse sequences of the thoracic and lumbar spine were obtained without and with intravenous contrast. CONTRAST:  57mL MULTIHANCE GADOBENATE DIMEGLUMINE 529 MG/ML IV SOLN COMPARISON:  CT abdomen pelvis 01/28/2016 FINDINGS: MRI THORACIC SPINE FINDINGS Alignment: Mild anterior listhesis T1-2 and T2-3 related to disc and facet degeneration. Remaining alignment normal. Vertebrae: Mild vertebral body edema at T8-9 with associated disc space changes compatible with discitis and osteomyelitis. Mild enhancement of the bone marrow at T8-9. Hemangioma T12 vertebral body. Hemangioma T4 vertebral body. 6 mm bone marrow lesion in the T6 vertebral body which shows mild enhancement. This may represent a hemangioma but it is not hyperintense on T1 prior to contrast. Cord: Cord compression at T8-9 related to epidural abscess. Cord signal normal. Paraspinal and other soft tissues: Discitis and osteomyelitis at T8-9. Ventral epidural abscess extends to the right of midline and is causing cord compression. Epidural abscess also extends anterior to the vertebral bodies into  the paraspinous soft tissues. There is edema at in the paraspinous soft tissues anteriorly. Moderately large pleural effusions bilaterally. Disc levels: Discitis and osteomyelitis at T8-9 with epidural abscess and cord compression. No other levels of infection identified. No fracture identified. MRI LUMBAR SPINE FINDINGS Segmentation:  Normal segmentation Alignment: Mild anterior slip at L4-5 and L5-S1. Mild retrolisthesis L2-3 and L3-4. Vertebrae: Negative for fracture or mass. Hemangioma T12 vertebral body. Mild enhancement around a Schmorl's node at L2-3. No evidence of discitis or osteomyelitis in the lumbar spine. Negative for metastatic disease. Conus medullaris: Extends to the T12-L1 level and appears normal. Paraspinal and other soft tissues: Paraspinous muscles are normal. No retroperitoneal mass or adenopathy or fluid collection. Disc levels: T12-L1:  Mild disc and mild facet degeneration L2-3: Mild disc and facet degeneration. Mild left foraminal narrowing. L2-3: Diffuse disc bulging and facet degeneration. Mild spinal stenosis. Mild left foraminal narrowing. L3-4: Disc degeneration with endplate osteophyte formation. Mild facet degeneration. Mild foraminal narrowing bilaterally. L4-5: Disc degeneration with diffuse disc bulging. Advanced facet arthrosis bilaterally with facet overgrowth and bilateral facet joint effusions. Moderate spinal stenosis. Moderate to severe subarticular and foraminal stenosis bilaterally right greater than left. L5-S1: Grade 1 anterior listhesis with advanced facet arthrosis. Mild foraminal encroachment bilaterally. IMPRESSION: Discitis and osteomyelitis at T8-9. Ventral epidural abscess extending to the right of midline causes spinal stenosis and cord compression. Cord  signal normal. Abscess extends anterior to the disc space in the paraspinous soft tissues which are edematous. No other areas of infection in the thoracic or lumbar spine. Lumbar degenerative changes as above  without acute abnormality in the lumbar spine Moderately large bilateral pleural effusions. Critical Value/emergent results were called by telephone at the time of interpretation on 02/02/2016 at 8:16 pm to Walden Field NP , who verbally acknowledged these results. Electronically Signed   By: Franchot Gallo M.D.   On: 02/02/2016 20:17   Mr Lumbar Spine W Wo Contrast  Result Date: 02/02/2016 CLINICAL DATA:  Severe back pain.  Blood cultures positive for MRSA EXAM: MRI THORACIC AND LUMBAR SPINE WITHOUT AND WITH CONTRAST TECHNIQUE: Multiplanar and multiecho pulse sequences of the thoracic and lumbar spine were obtained without and with intravenous contrast. CONTRAST:  72mL MULTIHANCE GADOBENATE DIMEGLUMINE 529 MG/ML IV SOLN COMPARISON:  CT abdomen pelvis 01/28/2016 FINDINGS: MRI THORACIC SPINE FINDINGS Alignment: Mild anterior listhesis T1-2 and T2-3 related to disc and facet degeneration. Remaining alignment normal. Vertebrae: Mild vertebral body edema at T8-9 with associated disc space changes compatible with discitis and osteomyelitis. Mild enhancement of the bone marrow at T8-9. Hemangioma T12 vertebral body. Hemangioma T4 vertebral body. 6 mm bone marrow lesion in the T6 vertebral body which shows mild enhancement. This may represent a hemangioma but it is not hyperintense on T1 prior to contrast. Cord: Cord compression at T8-9 related to epidural abscess. Cord signal normal. Paraspinal and other soft tissues: Discitis and osteomyelitis at T8-9. Ventral epidural abscess extends to the right of midline and is causing cord compression. Epidural abscess also extends anterior to the vertebral bodies into the paraspinous soft tissues. There is edema at in the paraspinous soft tissues anteriorly. Moderately large pleural effusions bilaterally. Disc levels: Discitis and osteomyelitis at T8-9 with epidural abscess and cord compression. No other levels of infection identified. No fracture identified. MRI LUMBAR SPINE  FINDINGS Segmentation:  Normal segmentation Alignment: Mild anterior slip at L4-5 and L5-S1. Mild retrolisthesis L2-3 and L3-4. Vertebrae: Negative for fracture or mass. Hemangioma T12 vertebral body. Mild enhancement around a Schmorl's node at L2-3. No evidence of discitis or osteomyelitis in the lumbar spine. Negative for metastatic disease. Conus medullaris: Extends to the T12-L1 level and appears normal. Paraspinal and other soft tissues: Paraspinous muscles are normal. No retroperitoneal mass or adenopathy or fluid collection. Disc levels: T12-L1:  Mild disc and mild facet degeneration L2-3: Mild disc and facet degeneration. Mild left foraminal narrowing. L2-3: Diffuse disc bulging and facet degeneration. Mild spinal stenosis. Mild left foraminal narrowing. L3-4: Disc degeneration with endplate osteophyte formation. Mild facet degeneration. Mild foraminal narrowing bilaterally. L4-5: Disc degeneration with diffuse disc bulging. Advanced facet arthrosis bilaterally with facet overgrowth and bilateral facet joint effusions. Moderate spinal stenosis. Moderate to severe subarticular and foraminal stenosis bilaterally right greater than left. L5-S1: Grade 1 anterior listhesis with advanced facet arthrosis. Mild foraminal encroachment bilaterally. IMPRESSION: Discitis and osteomyelitis at T8-9. Ventral epidural abscess extending to the right of midline causes spinal stenosis and cord compression. Cord signal normal. Abscess extends anterior to the disc space in the paraspinous soft tissues which are edematous. No other areas of infection in the thoracic or lumbar spine. Lumbar degenerative changes as above without acute abnormality in the lumbar spine Moderately large bilateral pleural effusions. Critical Value/emergent results were called by telephone at the time of interpretation on 02/02/2016 at 8:16 pm to Walden Field NP , who verbally acknowledged these results. Electronically Signed  By: Franchot Gallo M.D.   On:  02/02/2016 20:17    Scheduled Meds: . enoxaparin (LOVENOX) injection  40 mg Subcutaneous Q24H  . LORazepam  1 mg Intravenous Once  . metoprolol  5 mg Intravenous Q6H  . polyethylene glycol  17 g Oral Daily  . tamsulosin  0.4 mg Oral QPC breakfast  . vancomycin  1,250 mg Intravenous Q12H   Continuous Infusions:    Principal Problem:   MRSA bacteremia Active Problems:   Rheumatoid arthritis involving multiple sites with positive rheumatoid factor (HCC)   Essential hypertension   Hyponatremia   Left adrenal mass (HCC)   Epigastric pain   Hyperglycemia   Normocytic anemia   Thrombocytopenia (HCC)   Atherosclerotic peripheral vascular disease (HCC)   Degenerative joint disease of spine   BPH (benign prostatic hyperplasia)    Time spent: 76min    Mahi Zabriskie  [phone] [facility]  If 7PM-7AM, please contact night-coverage at [email or web address], password [email or web address] 02/04/2016, 12:29 PM  LOS: 6 days

## 2016-02-04 NOTE — Progress Notes (Signed)
INFECTIOUS DISEASE PROGRESS NOTE  ID: DWANYE TIPPENS is a 82 y.o. male with  Principal Problem:   MRSA bacteremia Active Problems:   Rheumatoid arthritis involving multiple sites with positive rheumatoid factor (HCC)   Essential hypertension   Hyponatremia   Left adrenal mass (HCC)   Epigastric pain   Hyperglycemia   Normocytic anemia   Thrombocytopenia (HCC)   Atherosclerotic peripheral vascular disease (HCC)   Degenerative joint disease of spine   BPH (benign prostatic hyperplasia)  Subjective: No BM.  Pain somewhat better  Abtx:  Anti-infectives    Start     Dose/Rate Route Frequency Ordered Stop   02/03/16 1400  vancomycin (VANCOCIN) 1,500 mg in sodium chloride 0.9 % 500 mL IVPB     1,500 mg 250 mL/hr over 120 Minutes Intravenous  Once 02/03/16 1315 02/03/16 1545   01/31/16 2300  vancomycin (VANCOCIN) 1,250 mg in sodium chloride 0.9 % 250 mL IVPB     1,250 mg 166.7 mL/hr over 90 Minutes Intravenous Every 12 hours 01/31/16 2220     01/29/16 2130  vancomycin (VANCOCIN) IVPB 1000 mg/200 mL premix  Status:  Discontinued     1,000 mg 200 mL/hr over 60 Minutes Intravenous Every 12 hours 01/29/16 2118 01/31/16 2220   01/29/16 0600  levofloxacin (LEVAQUIN) IVPB 750 mg  Status:  Discontinued     750 mg 100 mL/hr over 90 Minutes Intravenous Every 24 hours 01/29/16 0236 01/30/16 1016   01/29/16 0600  metroNIDAZOLE (FLAGYL) IVPB 500 mg  Status:  Discontinued     500 mg 100 mL/hr over 60 Minutes Intravenous Every 8 hours 01/29/16 0236 01/30/16 1016   01/28/16 2130  ciprofloxacin (CIPRO) IVPB 400 mg     400 mg 200 mL/hr over 60 Minutes Intravenous  Once 01/28/16 2122 01/28/16 2251   01/28/16 2130  metroNIDAZOLE (FLAGYL) tablet 500 mg     500 mg Oral  Once 01/28/16 2122 01/28/16 2131      Medications:  Scheduled: . enoxaparin (LOVENOX) injection  40 mg Subcutaneous Q24H  . LORazepam  1 mg Intravenous Once  . metoprolol  5 mg Intravenous Q6H  . polyethylene glycol  17 g  Oral BID  . senna-docusate  1 tablet Oral BID  . tamsulosin  0.4 mg Oral QPC breakfast  . vancomycin  1,250 mg Intravenous Q12H    Objective: Vital signs in last 24 hours: Temp:  [97.9 F (36.6 C)-99.2 F (37.3 C)] 97.9 F (36.6 C) (08/27 0545) Pulse Rate:  [74-92] 74 (08/27 0545) Resp:  [22] 22 (08/26 2145) BP: (165-194)/(80-93) 165/83 (08/27 0545) SpO2:  [96 %] 96 % (08/27 0545)   General appearance: alert, cooperative and no distress Resp: clear to auscultation bilaterally Cardio: regular rate and rhythm GI: normal findings: soft, non-tender and distended, hypoactive BS Neurologic: Motor: grade 5 plantar flexion bilaterally.   Lab Results  Recent Labs  02/03/16 0318 02/04/16 0544  WBC 5.0 8.9  HGB 11.4* 11.3*  HCT 33.8* 33.5*  NA 133* 133*  K 3.1* 3.1*  CL 98* 96*  CO2 26 30  BUN 12 11  CREATININE 0.65 0.59*   Liver Panel No results for input(s): PROT, ALBUMIN, AST, ALT, ALKPHOS, BILITOT, BILIDIR, IBILI in the last 72 hours. Sedimentation Rate No results for input(s): ESRSEDRATE in the last 72 hours. C-Reactive Protein No results for input(s): CRP in the last 72 hours.  Microbiology: Recent Results (from the past 240 hour(s))  Culture, blood (routine x 2)     Status:  Abnormal   Collection Time: 01/29/16  4:02 AM  Result Value Ref Range Status   Specimen Description BLOOD LEFT ANTECUBITAL  Final   Special Requests BOTTLES DRAWN AEROBIC AND ANAEROBIC 5CC   Final   Culture  Setup Time   Final    GRAM POSITIVE COCCI IN CLUSTERS IN BOTH AEROBIC AND ANAEROBIC BOTTLES Organism ID to follow CRITICAL RESULT CALLED TO, READ BACK BY AND VERIFIED WITH: M BELL 01/29/16 @ [place] (A)  Final   Report Status 01/31/2016 FINAL  Final   Organism ID, Bacteria METHICILLIN RESISTANT STAPHYLOCOCCUS AUREUS  Final      Susceptibility   Methicillin resistant staphylococcus aureus - MIC*    CIPROFLOXACIN >=8  RESISTANT Resistant     ERYTHROMYCIN >=8 RESISTANT Resistant     GENTAMICIN <=0.5 SENSITIVE Sensitive     OXACILLIN >=4 RESISTANT Resistant     TETRACYCLINE <=1 SENSITIVE Sensitive     VANCOMYCIN 1 SENSITIVE Sensitive     TRIMETH/SULFA <=10 SENSITIVE Sensitive     CLINDAMYCIN <=0.25 SENSITIVE Sensitive     RIFAMPIN <=0.5 SENSITIVE Sensitive     Inducible Clindamycin NEGATIVE Sensitive     * METHICILLIN RESISTANT STAPHYLOCOCCUS AUREUS  Blood Culture ID Panel (Reflexed)     Status: Abnormal   Collection Time: 01/29/16  4:02 AM  Result Value Ref Range Status   Enterococcus species NOT DETECTED NOT DETECTED Final   Vancomycin resistance NOT DETECTED NOT DETECTED Final   Listeria monocytogenes NOT DETECTED NOT DETECTED Final   Staphylococcus species DETECTED (A) NOT DETECTED Final    Comment: CRITICAL RESULT CALLED TO, READ BACK BY AND VERIFIED WITH: M BELL 01/29/16 @ 1929 M VESTAL    Staphylococcus aureus DETECTED (A) NOT DETECTED Final    Comment: CRITICAL RESULT CALLED TO, READ BACK BY AND VERIFIED WITH: M BELL 01/29/16 @ 1929 M VESTAL    Methicillin resistance DETECTED (A) NOT DETECTED Final    Comment: CRITICAL RESULT CALLED TO, READ BACK BY AND VERIFIED WITH: M BELL 01/29/16 @ 1929 M VESTAL    Streptococcus species NOT DETECTED NOT DETECTED Final   Streptococcus agalactiae NOT DETECTED NOT DETECTED Final   Streptococcus pneumoniae NOT DETECTED NOT DETECTED Final   Streptococcus pyogenes NOT DETECTED NOT DETECTED Final   Acinetobacter baumannii NOT DETECTED NOT DETECTED Final   Enterobacteriaceae species NOT DETECTED NOT DETECTED Final   Enterobacter cloacae complex NOT DETECTED NOT DETECTED Final   Escherichia coli NOT DETECTED NOT DETECTED Final   Klebsiella oxytoca NOT DETECTED NOT DETECTED Final   Klebsiella pneumoniae NOT DETECTED NOT DETECTED Final   Proteus species NOT DETECTED NOT DETECTED Final   Serratia marcescens NOT DETECTED NOT DETECTED Final   Carbapenem  resistance NOT DETECTED NOT DETECTED Final   Haemophilus influenzae NOT DETECTED NOT DETECTED Final   Neisseria meningitidis NOT DETECTED NOT DETECTED Final   Pseudomonas aeruginosa NOT DETECTED NOT DETECTED Final   Candida albicans NOT DETECTED NOT DETECTED Final   Candida glabrata NOT DETECTED NOT DETECTED Final   Candida krusei NOT DETECTED NOT DETECTED Final   Candida parapsilosis NOT DETECTED NOT DETECTED Final   Candida tropicalis NOT DETECTED NOT DETECTED Final  Culture, blood (routine x 2)     Status: Abnormal   Collection Time: 01/29/16  4:05 AM  Result Value Ref Range Status   Specimen Description BLOOD LEFT ANTECUBITAL  Final   Special Requests BOTTLES DRAWN AEROBIC AND ANAEROBIC 5CC   Final  Culture  Setup Time   Final    GRAM POSITIVE COCCI IN CLUSTERS IN BOTH AEROBIC AND ANAEROBIC BOTTLES CRITICAL VALUE NOTED.  VALUE IS CONSISTENT WITH PREVIOUSLY REPORTED AND CALLED VALUE.    Culture (A)  Final    STAPHYLOCOCCUS AUREUS SUSCEPTIBILITIES PERFORMED ON PREVIOUS CULTURE WITHIN THE LAST 5 DAYS.    Report Status 01/31/2016 FINAL  Final  MRSA culture     Status: None   Collection Time: 01/30/16  1:16 PM  Result Value Ref Range Status   Specimen Description NASAL SWAB  Final   Special Requests NONE  Final   Culture NEGATIVE  Final   Report Status 01/31/2016 FINAL  Final  Culture, blood (routine x 2)     Status: Abnormal   Collection Time: 01/31/16  6:20 AM  Result Value Ref Range Status   Specimen Description BLOOD LEFT ANTECUBITAL  Final   Special Requests IN PEDIATRIC BOTTLE 3CC  Final   Culture  Setup Time   Final    GRAM POSITIVE COCCI IN CLUSTERS AEROBIC BOTTLE ONLY CRITICAL RESULT CALLED TO, READ BACK BY AND VERIFIED WITH: A MYER 01/31/16 @ 2119 M VESTAL    Culture (A)  Final    STAPHYLOCOCCUS AUREUS SUSCEPTIBILITIES PERFORMED ON PREVIOUS CULTURE WITHIN THE LAST 5 DAYS.    Report Status 02/02/2016 FINAL  Final  Culture, blood (routine x 2)     Status: Abnormal    Collection Time: 01/31/16  6:25 AM  Result Value Ref Range Status   Specimen Description BLOOD LEFT HAND  Final   Special Requests IN PEDIATRIC BOTTLE 1CC  Final   Culture  Setup Time   Final    GRAM POSITIVE COCCI IN CLUSTERS AEROBIC BOTTLE ONLY CRITICAL VALUE NOTED.  VALUE IS CONSISTENT WITH PREVIOUSLY REPORTED AND CALLED VALUE.    Culture (A)  Final    STAPHYLOCOCCUS AUREUS SUSCEPTIBILITIES PERFORMED ON PREVIOUS CULTURE WITHIN THE LAST 5 DAYS.    Report Status 02/02/2016 FINAL  Final  Culture, blood (routine x 2)     Status: None (Preliminary result)   Collection Time: 02/01/16 10:48 AM  Result Value Ref Range Status   Specimen Description BLOOD RIGHT ARM  Final   Special Requests   Final    BOTTLES DRAWN AEROBIC AND ANAEROBIC 10CC BLUE 5 CC RED   Culture NO GROWTH 3 DAYS  Final   Report Status PENDING  Incomplete  Culture, blood (routine x 2)     Status: None (Preliminary result)   Collection Time: 02/01/16 10:55 AM  Result Value Ref Range Status   Specimen Description BLOOD LEFT HAND  Final   Special Requests IN PEDIATRIC BOTTLE 3CC  Final   Culture NO GROWTH 3 DAYS  Final   Report Status PENDING  Incomplete    Studies/Results: Mr Thoracic Spine W Wo Contrast  Result Date: 02/02/2016 CLINICAL DATA:  Severe back pain.  Blood cultures positive for MRSA EXAM: MRI THORACIC AND LUMBAR SPINE WITHOUT AND WITH CONTRAST TECHNIQUE: Multiplanar and multiecho pulse sequences of the thoracic and lumbar spine were obtained without and with intravenous contrast. CONTRAST:  11mL MULTIHANCE GADOBENATE DIMEGLUMINE 529 MG/ML IV SOLN COMPARISON:  CT abdomen pelvis 01/28/2016 FINDINGS: MRI THORACIC SPINE FINDINGS Alignment: Mild anterior listhesis T1-2 and T2-3 related to disc and facet degeneration. Remaining alignment normal. Vertebrae: Mild vertebral body edema at T8-9 with associated disc space changes compatible with discitis and osteomyelitis. Mild enhancement of the bone marrow at T8-9.  Hemangioma T12 vertebral body. Hemangioma T4 vertebral body.  6 mm bone marrow lesion in the T6 vertebral body which shows mild enhancement. This may represent a hemangioma but it is not hyperintense on T1 prior to contrast. Cord: Cord compression at T8-9 related to epidural abscess. Cord signal normal. Paraspinal and other soft tissues: Discitis and osteomyelitis at T8-9. Ventral epidural abscess extends to the right of midline and is causing cord compression. Epidural abscess also extends anterior to the vertebral bodies into the paraspinous soft tissues. There is edema at in the paraspinous soft tissues anteriorly. Moderately large pleural effusions bilaterally. Disc levels: Discitis and osteomyelitis at T8-9 with epidural abscess and cord compression. No other levels of infection identified. No fracture identified. MRI LUMBAR SPINE FINDINGS Segmentation:  Normal segmentation Alignment: Mild anterior slip at L4-5 and L5-S1. Mild retrolisthesis L2-3 and L3-4. Vertebrae: Negative for fracture or mass. Hemangioma T12 vertebral body. Mild enhancement around a Schmorl's node at L2-3. No evidence of discitis or osteomyelitis in the lumbar spine. Negative for metastatic disease. Conus medullaris: Extends to the T12-L1 level and appears normal. Paraspinal and other soft tissues: Paraspinous muscles are normal. No retroperitoneal mass or adenopathy or fluid collection. Disc levels: T12-L1:  Mild disc and mild facet degeneration L2-3: Mild disc and facet degeneration. Mild left foraminal narrowing. L2-3: Diffuse disc bulging and facet degeneration. Mild spinal stenosis. Mild left foraminal narrowing. L3-4: Disc degeneration with endplate osteophyte formation. Mild facet degeneration. Mild foraminal narrowing bilaterally. L4-5: Disc degeneration with diffuse disc bulging. Advanced facet arthrosis bilaterally with facet overgrowth and bilateral facet joint effusions. Moderate spinal stenosis. Moderate to severe subarticular  and foraminal stenosis bilaterally right greater than left. L5-S1: Grade 1 anterior listhesis with advanced facet arthrosis. Mild foraminal encroachment bilaterally. IMPRESSION: Discitis and osteomyelitis at T8-9. Ventral epidural abscess extending to the right of midline causes spinal stenosis and cord compression. Cord signal normal. Abscess extends anterior to the disc space in the paraspinous soft tissues which are edematous. No other areas of infection in the thoracic or lumbar spine. Lumbar degenerative changes as above without acute abnormality in the lumbar spine Moderately large bilateral pleural effusions. Critical Value/emergent results were called by telephone at the time of interpretation on 02/02/2016 at 8:16 pm to Walden Field NP , who verbally acknowledged these results. Electronically Signed   By: Franchot Gallo M.D.   On: 02/02/2016 20:17   Mr Lumbar Spine W Wo Contrast  Result Date: 02/02/2016 CLINICAL DATA:  Severe back pain.  Blood cultures positive for MRSA EXAM: MRI THORACIC AND LUMBAR SPINE WITHOUT AND WITH CONTRAST TECHNIQUE: Multiplanar and multiecho pulse sequences of the thoracic and lumbar spine were obtained without and with intravenous contrast. CONTRAST:  37mL MULTIHANCE GADOBENATE DIMEGLUMINE 529 MG/ML IV SOLN COMPARISON:  CT abdomen pelvis 01/28/2016 FINDINGS: MRI THORACIC SPINE FINDINGS Alignment: Mild anterior listhesis T1-2 and T2-3 related to disc and facet degeneration. Remaining alignment normal. Vertebrae: Mild vertebral body edema at T8-9 with associated disc space changes compatible with discitis and osteomyelitis. Mild enhancement of the bone marrow at T8-9. Hemangioma T12 vertebral body. Hemangioma T4 vertebral body. 6 mm bone marrow lesion in the T6 vertebral body which shows mild enhancement. This may represent a hemangioma but it is not hyperintense on T1 prior to contrast. Cord: Cord compression at T8-9 related to epidural abscess. Cord signal normal. Paraspinal and  other soft tissues: Discitis and osteomyelitis at T8-9. Ventral epidural abscess extends to the right of midline and is causing cord compression. Epidural abscess also extends anterior to the vertebral bodies into the  paraspinous soft tissues. There is edema at in the paraspinous soft tissues anteriorly. Moderately large pleural effusions bilaterally. Disc levels: Discitis and osteomyelitis at T8-9 with epidural abscess and cord compression. No other levels of infection identified. No fracture identified. MRI LUMBAR SPINE FINDINGS Segmentation:  Normal segmentation Alignment: Mild anterior slip at L4-5 and L5-S1. Mild retrolisthesis L2-3 and L3-4. Vertebrae: Negative for fracture or mass. Hemangioma T12 vertebral body. Mild enhancement around a Schmorl's node at L2-3. No evidence of discitis or osteomyelitis in the lumbar spine. Negative for metastatic disease. Conus medullaris: Extends to the T12-L1 level and appears normal. Paraspinal and other soft tissues: Paraspinous muscles are normal. No retroperitoneal mass or adenopathy or fluid collection. Disc levels: T12-L1:  Mild disc and mild facet degeneration L2-3: Mild disc and facet degeneration. Mild left foraminal narrowing. L2-3: Diffuse disc bulging and facet degeneration. Mild spinal stenosis. Mild left foraminal narrowing. L3-4: Disc degeneration with endplate osteophyte formation. Mild facet degeneration. Mild foraminal narrowing bilaterally. L4-5: Disc degeneration with diffuse disc bulging. Advanced facet arthrosis bilaterally with facet overgrowth and bilateral facet joint effusions. Moderate spinal stenosis. Moderate to severe subarticular and foraminal stenosis bilaterally right greater than left. L5-S1: Grade 1 anterior listhesis with advanced facet arthrosis. Mild foraminal encroachment bilaterally. IMPRESSION: Discitis and osteomyelitis at T8-9. Ventral epidural abscess extending to the right of midline causes spinal stenosis and cord compression.  Cord signal normal. Abscess extends anterior to the disc space in the paraspinous soft tissues which are edematous. No other areas of infection in the thoracic or lumbar spine. Lumbar degenerative changes as above without acute abnormality in the lumbar spine Moderately large bilateral pleural effusions. Critical Value/emergent results were called by telephone at the time of interpretation on 02/02/2016 at 8:16 pm to Walden Field NP , who verbally acknowledged these results. Electronically Signed   By: Franchot Gallo M.D.   On: 02/02/2016 20:17     Assessment/Plan: MRSA Bacteremia (8-21 and 8-23) Epidural Abscess, Osteo, Discitis T8/9 Ileus HTN hypokalemia  Appreciate neurosurgery f/u TEE negative for vegetation.  BCx negative 8-24.  Will continue vanco, watch his f/u BCx.  Cr stable on vanco Bowel hygeine- still distended.  Primary to manage HTN, still quite elevated  Total days of antibiotics: 6 vanco         Bobby Rumpf Infectious Diseases (pager) [phone] [email or web address] 02/04/2016, 2:31 PM  LOS: 6 days

## 2016-02-05 ENCOUNTER — Encounter (HOSPITAL_COMMUNITY): Payer: Self-pay | Admitting: Cardiology

## 2016-02-05 DIAGNOSIS — R7881 Bacteremia: Secondary | ICD-10-CM

## 2016-02-05 LAB — URINALYSIS, ROUTINE W REFLEX MICROSCOPIC
GLUCOSE, UA: NEGATIVE mg/dL
HGB URINE DIPSTICK: NEGATIVE
Ketones, ur: NEGATIVE mg/dL
Leukocytes, UA: NEGATIVE
Nitrite: NEGATIVE
PH: 6 (ref 5.0–8.0)
Protein, ur: 30 mg/dL — AB
SPECIFIC GRAVITY, URINE: 1.018 (ref 1.005–1.030)

## 2016-02-05 LAB — BASIC METABOLIC PANEL
Anion gap: 8 (ref 5–15)
BUN: 9 mg/dL (ref 6–20)
CHLORIDE: 94 mmol/L — AB (ref 101–111)
CO2: 30 mmol/L (ref 22–32)
Calcium: 9.2 mg/dL (ref 8.9–10.3)
Creatinine, Ser: 0.57 mg/dL — ABNORMAL LOW (ref 0.61–1.24)
GFR calc Af Amer: >60 mL/min (ref >60)
GFR calc non Af Amer: >60 mL/min (ref >60)
GLUCOSE: 115 mg/dL — AB (ref 65–99)
POTASSIUM: 3.4 mmol/L — AB (ref 3.5–5.1)
Sodium: 132 mmol/L — ABNORMAL LOW (ref 135–145)

## 2016-02-05 LAB — CBC
HCT: 34.2 % — ABNORMAL LOW (ref 39.0–52.0)
HEMOGLOBIN: 11.4 g/dL — AB (ref 13.0–17.0)
MCH: 31.2 pg (ref 26.0–34.0)
MCHC: 33.3 g/dL (ref 30.0–36.0)
MCV: 93.7 fL (ref 78.0–100.0)
PLATELETS: 169 10*3/uL (ref 150–400)
RBC: 3.65 MIL/uL — AB (ref 4.22–5.81)
RDW: 13.2 % (ref 11.5–15.5)
WBC: 8.2 10*3/uL (ref 4.0–10.5)

## 2016-02-05 LAB — URINE MICROSCOPIC-ADD ON
RBC / HPF: NONE SEEN RBC/hpf (ref 0–5)
SQUAMOUS EPITHELIAL / LPF: NONE SEEN

## 2016-02-05 LAB — SEDIMENTATION RATE: Sed Rate: 75 mm/hr — ABNORMAL HIGH (ref 0–16)

## 2016-02-05 LAB — C-REACTIVE PROTEIN: CRP: 13.2 mg/dL — ABNORMAL HIGH (ref <1.0)

## 2016-02-05 MED ORDER — OXYCODONE HCL 5 MG PO TABS
5.0000 mg | ORAL_TABLET | ORAL | Status: DC | PRN
  Administered 2016-02-05 – 2016-02-07 (×6): 5 mg via ORAL
  Filled 2016-02-05 (×6): qty 1

## 2016-02-05 MED ORDER — HYDRALAZINE HCL 25 MG PO TABS
25.0000 mg | ORAL_TABLET | Freq: Three times a day (TID) | ORAL | Status: DC
  Administered 2016-02-05 – 2016-02-06 (×4): 25 mg via ORAL
  Filled 2016-02-05 (×4): qty 1

## 2016-02-05 MED ORDER — METOPROLOL SUCCINATE ER 50 MG PO TB24
50.0000 mg | ORAL_TABLET | Freq: Every day | ORAL | Status: DC
Start: 2016-02-05 — End: 2016-02-07
  Administered 2016-02-05 – 2016-02-07 (×3): 50 mg via ORAL
  Filled 2016-02-05 (×4): qty 1

## 2016-02-05 MED ORDER — LIDOCAINE 5 % EX PTCH
1.0000 | MEDICATED_PATCH | CUTANEOUS | Status: DC
  Administered 2016-02-05 – 2016-02-07 (×3): 1 via TRANSDERMAL
  Filled 2016-02-05 (×3): qty 1

## 2016-02-05 MED ORDER — AMLODIPINE BESYLATE 10 MG PO TABS
10.0000 mg | ORAL_TABLET | Freq: Every day | ORAL | Status: DC
  Administered 2016-02-05 – 2016-02-07 (×3): 10 mg via ORAL
  Filled 2016-02-05 (×3): qty 1

## 2016-02-05 MED ORDER — NAPROXEN 250 MG PO TABS
375.0000 mg | ORAL_TABLET | Freq: Two times a day (BID) | ORAL | Status: DC
  Administered 2016-02-05 – 2016-02-07 (×5): 375 mg via ORAL
  Filled 2016-02-05 (×5): qty 2

## 2016-02-05 MED ORDER — POTASSIUM CHLORIDE 20 MEQ/15ML (10%) PO SOLN
40.0000 meq | Freq: Once | ORAL | Status: AC
  Administered 2016-02-05: 40 meq via ORAL
  Filled 2016-02-05: qty 30

## 2016-02-05 NOTE — Evaluation (Signed)
Occupational Therapy Evaluation Patient Details Name: Tanner Rice MRN: [id number] DOB: [birth year] Today's Date: 02/05/2016    History of Present Illness Pt. has MRSA that collnzed on T9 area that is causing him pain. Pt. has illius that wife states is resolving. Pt. PMH: RA, HTN, DDD, adernal mass.    Clinical Impression   Pt. Is having pain which is limiting pt. Ability with ADLs and moiblity. Discussed with pt. And wife the role of OT and are in agreement for services. Pt. Wants to increase ability with ADLs and mobility. Pt. Is very weak and want to work with OT .     Follow Up Recommendations  Home health OT    Equipment Recommendations  3 in 1 bedside comode    Recommendations for Other Services       Precautions / Restrictions Precautions Precautions: Fall Restrictions Weight Bearing Restrictions: No      Mobility Bed Mobility                  Transfers                      Balance                                            ADL Overall ADL's : Needs assistance/impaired Eating/Feeding: Independent   Grooming: Minimal assistance   Upper Body Bathing: Moderate assistance   Lower Body Bathing: Maximal assistance   Upper Body Dressing : Moderate assistance   Lower Body Dressing: Total assistance   Toilet Transfer: Moderate assistance           Functional mobility during ADLs:  (Pt. deffering mobility at this time secondary to pain. ) General ADL Comments:  (Pt6. is weak aned is having pain with movement.)     Vision     Perception     Praxis      Pertinent Vitals/Pain Pain Assessment: 0-10 Pain Score: 10-Worst pain ever Pain Location:  (all oveer back) Pain Descriptors / Indicators: Aching;Burning;Constant Pain Intervention(s): Monitored during session;Premedicated before session     Hand Dominance Right   Extremity/Trunk Assessment Upper Extremity Assessment Upper Extremity Assessment: Overall  WFL for tasks assessed           Communication Communication Communication: No difficulties   Cognition Arousal/Alertness: Awake/alert Behavior During Therapy: WFL for tasks assessed/performed Overall Cognitive Status: Within Functional Limits for tasks assessed                     General Comments       Exercises       Shoulder Instructions      Home Living Family/patient expects to be discharged to:: Private residence Living Arrangements: Spouse/significant other Available Help at Discharge: Family;Available 24 hours/day   Home Access: Stairs to enter [employer] of Steps:  (3-4 steps)   Home Layout: Able to live on main level with bedroom/bathroom         Bathroom Toilet: Standard     Home Equipment: None   Additional Comments:  (Pt. will take sink bath until he can go upstairs. )      Prior Functioning/Environment Level of Independence: Independent        Comments: Pt. worked part time.    OT Diagnosis: Generalized weakness;Acute pain   OT Problem List: Decreased strength;Decreased activity tolerance;Decreased knowledge  of use of DME or AE;Pain   OT Treatment/Interventions:      OT Goals(Current goals can be found in the care plan section) Acute Rehab OT Goals Patient Stated Goal:  (get better and go home. ) OT Goal Formulation: With patient Time For Goal Achievement: 02/19/16 Potential to Achieve Goals: Good ADL Goals Pt Will Perform Grooming: standing;with supervision Pt Will Perform Upper Body Bathing: with supervision;sitting Pt Will Perform Lower Body Bathing: with supervision;with adaptive equipment;sit to/from stand Pt Will Perform Upper Body Dressing: with supervision;sitting Pt Will Perform Lower Body Dressing: with supervision;sit to/from stand;with adaptive equipment Pt Will Transfer to Toilet: with supervision;bedside commode Pt Will Perform Toileting - Clothing Manipulation and hygiene: with supervision;sit  to/from stand  OT Frequency:     Barriers to D/C:            Co-evaluation              End of Session    Activity Tolerance: Patient limited by pain Patient left: in chair;with call bell/phone within reach;with family/visitor present   Time: [id number] OT Time Calculation (min): 38 min Charges:  OT General Charges $OT Visit: 1 Procedure OT Evaluation $OT Eval Moderate Complexity: 1 Procedure OT Treatments $Self Care/Home Management : 8-22 mins G-Codes:    Merida Alcantar 03/05/16, 1:29 PM

## 2016-02-05 NOTE — Progress Notes (Signed)
At patient bedside explaining PICC need, procedure, risk and benefit   Patient groaning during this explaination and stated that he is in pain and having lots of gastric discomfort.  Patient asked if he would be leaving today with PICC and I explained that PICC is being placed in anticipation of discharge but no discharge orders for today.  Patient stated that he did not want to have PICC placed today due to his discomfort and that he needed IV pain medication.  Cindi, RN updated and informed that patient is requesting stronger pain medication.  [facility] will attempt to see patient again 8-29 for PICC insertion.  Carolee Rota, RN [facility]

## 2016-02-05 NOTE — Evaluation (Signed)
Physical Therapy Evaluation Patient Details Name: Tanner Rice MRN: [id number] DOB: 01-09-1934 Today's Date: 02/05/2016   History of Present Illness  Pt. admitted with MRSA, discitis at T9 area that is causing him pain. Pt. has illius that wife states is resolving. PMH: RA, HTN, DDD, adernal mass.   Clinical Impression  Pt admitted with above diagnosis. Pt currently with functional limitations due to the deficits listed below (see PT Problem List). Min assist for stand pivot transfer and bed mobility, gait deferred 2* pain. Pt will benefit from skilled PT to increase their independence and safety with mobility to allow discharge to the venue listed below.       Follow Up Recommendations Home health PT    Equipment Recommendations  Rolling walker with 5" wheels    Recommendations for Other Services       Precautions / Restrictions Precautions Precautions: Fall Restrictions Weight Bearing Restrictions: No      Mobility  Bed Mobility Overal bed mobility: Needs Assistance Bed Mobility: Sit to Supine       Sit to supine: Min assist   General bed mobility comments: for BLEs into bed  Transfers Overall transfer level: Needs assistance Equipment used: None Transfers: Sit to/from [facility] Sit to Stand: Min assist Stand pivot transfers: Min assist       General transfer comment: min A to rise from [facility], Min A to steady to turn to bed, activity tolerance limited by back pain  Ambulation/Gait                Stairs            Wheelchair Mobility    Modified Rankin (Stroke Patients Only)       Balance Overall balance assessment: Needs assistance   Sitting balance-Leahy Scale: Good       Standing balance-Leahy Scale: Good                               Pertinent Vitals/Pain Pain Assessment: 0-10 Pain Score: 10-Worst pain ever Pain Location: T9 Pain Descriptors / Indicators: Sore Pain Intervention(s): Limited  activity within patient's tolerance;Monitored during session;Premedicated before session;Patient requesting pain meds-RN notified    Home Living Family/patient expects to be discharged to:: Private residence Living Arrangements: Spouse/significant other Available Help at Discharge: Family;Available 24 hours/day   Home Access: Stairs to enter   [employer] of Steps: 3-4 (3-4 steps) Home Layout: Able to live on main level with bedroom/bathroom Home Equipment: None Additional Comments: pt will take sink bath until he can go upstairs (Pt. will take sink bath until he can go upstairs. )    Prior Function Level of Independence: Independent         Comments: Pt. worked part time.     Hand Dominance   Dominant Hand: Right    Extremity/Trunk Assessment   Upper Extremity Assessment: Defer to OT evaluation           Lower Extremity Assessment: Overall WFL for tasks assessed (knee extension +4/5 B)      Cervical / Trunk Assessment: Kyphotic  Communication   Communication: No difficulties  Cognition Arousal/Alertness: Awake/alert Behavior During Therapy: WFL for tasks assessed/performed Overall Cognitive Status: Within Functional Limits for tasks assessed                      General Comments      Exercises  Assessment/Plan    PT Assessment Patient needs continued PT services  PT Diagnosis Difficulty walking;Acute pain   PT Problem List Decreased activity tolerance;Decreased mobility;Pain  PT Treatment Interventions Gait training;Therapeutic activities   PT Goals (Current goals can be found in the Care Plan section) Acute Rehab PT Goals Patient Stated Goal: return to independence with mobility PT Goal Formulation: With patient Time For Goal Achievement: 02/19/16 Potential to Achieve Goals: Good    Frequency Min 3X/week   Barriers to discharge        Co-evaluation               End of Session Equipment Utilized During  Treatment: Gait belt Activity Tolerance: Patient limited by pain Patient left: in bed;with call bell/phone within reach;with nursing/sitter in room Nurse Communication: Mobility status;Patient requests pain meds         Time: [id number] PT Time Calculation (min) (ACUTE ONLY): 12 min   Charges:   PT Evaluation $PT Eval Low Complexity: 1 Procedure     PT G Codes:        Philomena Doheny 02/05/2016, 2:19 PM [phone]

## 2016-02-05 NOTE — Progress Notes (Signed)
Pt has had PVC runs this am. Paged Dr Broadus Kenon. Will continue to monitor.

## 2016-02-05 NOTE — Progress Notes (Signed)
[facility] PROGRESS NOTE  Tanner Rice [id number] DOB: 1934/05/16 DOA: 01/28/2016 PCP: Mathews Argyle, MD   82/M with RA on remicaide and MTX admitted with ABd pain, found to have MRSA bacteremia Subsequently found to have Ileus and T8-9 discitis and epidural abscess  Assessment/Plan: 1. Sepsis/MRSA bacteremia: Initially presented as abdominal pain and fever and started on treatment for presumed diverticulitis. Patient's blood cultures returned positive 2 for MRSA.  -Continue Iv Vanc Day 6, repeat Blood Cx 8/23 with MRSA again -repeat Blood cultures from 8/24 -NGTD -Appreciate ID consult, had a rash on upper chest recently which resolved, wonder if that caused MRSA seeding -MRI L and T spine with T9 abscess and discitis/OM-plan for 6weeks of Abx -TEE negative for endocarditis  2. 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 Ileus: -improving, was NPO with NGT -had a BM 8/24 am, NG pulled out early 8/25 am by pt, NG output was very less and hence I didn't replace NG and started trial of clears, continue  Laxatives, tolerating clears, BM again last pm (8/27) -advance to soft diet -recent CT unremarkable -KUB 8/22 with ileus, contrast seen in colon, hence no SBO  2. Hyponatremia:  -stable, now off IVF, improving  3. HTN:  -resumed amlodipine and metoprolol - add PO hydralazine,  hydralazine PRN till ileus completely resolves  4. RA:  -Hold methotrexate -On Remicade  5. Left adrenal mass:  -Adrenal protocol CT as an outpatient once all acute issues resolved  DVt proph: lovenox  Code Status: FULL  Family Communication: wife at bedside Disposition  Plan:  SNF when more stable and ileus improved   Consultants:  ID  Procedures:  TEE - 02/02/16 - PENDING  Antibiotics:  Levaquin - 8/20>>>8/22  Flagyl - 8/20>>8/22  Vancomycin - 8/21 (Started by Pharmacy immediately after + [address] noted) >>  HPI/Subjective: Back pain, + BM yesterday, tolerating clears, better overall  Objective: Vitals:   02/05/16 0532 02/05/16 1203  BP: (!) 176/89 (!) 182/86  Pulse: 81 90  Resp: 18   Temp: 98.1 F (36.7 C)     Intake/Output Summary (Last 24 hours) at 02/05/16 1339 Last data filed at 02/05/16 0900  Gross per 24 hour  Intake             1340 ml  Output              835 ml  Net              505 ml   Filed Weights   01/28/16 1710 01/29/16 0145 02/03/16 1424  Weight: 73.5 kg (162 lb) 75.4 kg (166 lb 3.2 oz) 76.3 kg (168 lb 3.2 oz)    Exam:   General:  AAOx2, tired appearing  Cardiovascular: Regular rate and rhythm, 2/6 systolic murmur  Respiratory: Decreased breath sounds in bases with intermittent crackles. Normal effort  Abdomen:  Less distended, but soft, + BS, non tender  Musculoskeletal: Normal tone bilaterally in upper and lower extremities, no bony abnormalities, moves all extremity coordinated fashion.   Ext: no edema  Data Reviewed: Basic Metabolic Panel:  Recent Labs Lab 02/01/16 0440 02/02/16 0935 02/03/16 0318 02/04/16 0544 02/05/16 0532  NA 128* 134* 133* 133* 132*  K 3.7  3.3* 3.1* 3.1* 3.4*  CL 99* 102 98* 96* 94*  CO2 23 25 26 30 30   GLUCOSE 117* 93 110* 128* 115*  BUN 18 13 12 11 9   CREATININE 0.75 0.63 0.65 0.59* 0.57*  CALCIUM 8.3* 8.5* 8.9 8.9 9.2   Liver Function Tests:  Recent Labs Lab 01/30/16 1001 02/01/16 0440  AST 20 22  ALT 15* 17  ALKPHOS 60 56  BILITOT 1.3* 1.5*  PROT 6.0* 4.9*  ALBUMIN 2.9* 2.2*   No results for input(s): LIPASE, AMYLASE in the last 168 hours. No results for input(s): AMMONIA in the last 168 hours. CBC:  Recent Labs Lab 02/01/16 0440 02/02/16 0935  02/03/16 0318 02/04/16 0544 02/05/16 0532  WBC 10.4 7.2 5.0 8.9 8.2  HGB 11.0* 11.7* 11.4* 11.3* 11.4*  HCT 32.9* 35.6* 33.8* 33.5* 34.2*  MCV 93.7 95.4 93.6 93.1 93.7  PLT 134* 130* 123* 137* 169   Cardiac Enzymes: No results for input(s): CKTOTAL, CKMB, CKMBINDEX, TROPONINI in the last 168 hours. BNP (last 3 results) No results for input(s): BNP in the last 8760 hours.  ProBNP (last 3 results) No results for input(s): PROBNP in the last 8760 hours.  CBG:  Recent Labs Lab 02/03/16 0746  GLUCAP 94    Recent Results (from the past 240 hour(s))  Culture, blood (routine x 2)     Status: Abnormal   Collection Time: 01/29/16  4:02 AM  Result Value Ref Range Status   Specimen Description BLOOD LEFT ANTECUBITAL  Final   Special Requests BOTTLES DRAWN AEROBIC AND ANAEROBIC 5CC   Final   Culture  Setup Time   Final    GRAM POSITIVE COCCI IN CLUSTERS IN BOTH AEROBIC AND ANAEROBIC BOTTLES Organism ID to follow CRITICAL RESULT CALLED TO, READ BACK BY AND VERIFIED WITH: M BELL 01/29/16 @ [place] (A)  Final   Report Status 01/31/2016 FINAL  Final   Organism ID, Bacteria METHICILLIN RESISTANT STAPHYLOCOCCUS AUREUS  Final      Susceptibility   Methicillin resistant staphylococcus aureus - MIC*    CIPROFLOXACIN >=8 RESISTANT Resistant     ERYTHROMYCIN >=8 RESISTANT Resistant     GENTAMICIN <=0.5 SENSITIVE Sensitive     OXACILLIN >=4 RESISTANT Resistant     TETRACYCLINE <=1 SENSITIVE Sensitive     VANCOMYCIN 1 SENSITIVE Sensitive     TRIMETH/SULFA <=10 SENSITIVE Sensitive     CLINDAMYCIN <=0.25 SENSITIVE Sensitive     RIFAMPIN <=0.5 SENSITIVE Sensitive     Inducible Clindamycin NEGATIVE Sensitive     * METHICILLIN RESISTANT STAPHYLOCOCCUS AUREUS  Blood Culture ID Panel (Reflexed)     Status: Abnormal   Collection Time: 01/29/16  4:02 AM  Result Value Ref Range Status   Enterococcus species NOT DETECTED NOT DETECTED  Final   Vancomycin resistance NOT DETECTED NOT DETECTED Final   Listeria monocytogenes NOT DETECTED NOT DETECTED Final   Staphylococcus species DETECTED (A) NOT DETECTED Final    Comment: CRITICAL RESULT CALLED TO, READ BACK BY AND VERIFIED WITH: M BELL 01/29/16 @ 1929 M VESTAL    Staphylococcus aureus DETECTED (A) NOT DETECTED Final    Comment: CRITICAL RESULT CALLED TO, READ BACK BY AND VERIFIED WITH: M BELL 01/29/16 @ 1929 M VESTAL    Methicillin resistance DETECTED (A) NOT DETECTED Final    Comment: CRITICAL RESULT CALLED TO, READ BACK BY AND VERIFIED WITH: M BELL 01/29/16 @ 1929 M VESTAL    Streptococcus species NOT  DETECTED NOT DETECTED Final   Streptococcus agalactiae NOT DETECTED NOT DETECTED Final   Streptococcus pneumoniae NOT DETECTED NOT DETECTED Final   Streptococcus pyogenes NOT DETECTED NOT DETECTED Final   Acinetobacter baumannii NOT DETECTED NOT DETECTED Final   Enterobacteriaceae species NOT DETECTED NOT DETECTED Final   Enterobacter cloacae complex NOT DETECTED NOT DETECTED Final   Escherichia coli NOT DETECTED NOT DETECTED Final   Klebsiella oxytoca NOT DETECTED NOT DETECTED Final   Klebsiella pneumoniae NOT DETECTED NOT DETECTED Final   Proteus species NOT DETECTED NOT DETECTED Final   Serratia marcescens NOT DETECTED NOT DETECTED Final   Carbapenem resistance NOT DETECTED NOT DETECTED Final   Haemophilus influenzae NOT DETECTED NOT DETECTED Final   Neisseria meningitidis NOT DETECTED NOT DETECTED Final   Pseudomonas aeruginosa NOT DETECTED NOT DETECTED Final   Candida albicans NOT DETECTED NOT DETECTED Final   Candida glabrata NOT DETECTED NOT DETECTED Final   Candida krusei NOT DETECTED NOT DETECTED Final   Candida parapsilosis NOT DETECTED NOT DETECTED Final   Candida tropicalis NOT DETECTED NOT DETECTED Final  Culture, blood (routine x 2)     Status: Abnormal   Collection Time: 01/29/16  4:05 AM  Result Value Ref Range Status   Specimen Description BLOOD  LEFT ANTECUBITAL  Final   Special Requests BOTTLES DRAWN AEROBIC AND ANAEROBIC 5CC   Final   Culture  Setup Time   Final    GRAM POSITIVE COCCI IN CLUSTERS IN BOTH AEROBIC AND ANAEROBIC BOTTLES CRITICAL VALUE NOTED.  VALUE IS CONSISTENT WITH PREVIOUSLY REPORTED AND CALLED VALUE.    Culture (A)  Final    STAPHYLOCOCCUS AUREUS SUSCEPTIBILITIES PERFORMED ON PREVIOUS CULTURE WITHIN THE LAST 5 DAYS.    Report Status 01/31/2016 FINAL  Final  MRSA culture     Status: None   Collection Time: 01/30/16  1:16 PM  Result Value Ref Range Status   Specimen Description NASAL SWAB  Final   Special Requests NONE  Final   Culture NEGATIVE  Final   Report Status 01/31/2016 FINAL  Final  Culture, blood (routine x 2)     Status: Abnormal   Collection Time: 01/31/16  6:20 AM  Result Value Ref Range Status   Specimen Description BLOOD LEFT ANTECUBITAL  Final   Special Requests IN PEDIATRIC BOTTLE 3CC  Final   Culture  Setup Time   Final    GRAM POSITIVE COCCI IN CLUSTERS AEROBIC BOTTLE ONLY CRITICAL RESULT CALLED TO, READ BACK BY AND VERIFIED WITH: A MYER 01/31/16 @ 2119 M VESTAL    Culture (A)  Final    STAPHYLOCOCCUS AUREUS SUSCEPTIBILITIES PERFORMED ON PREVIOUS CULTURE WITHIN THE LAST 5 DAYS.    Report Status 02/02/2016 FINAL  Final  Culture, blood (routine x 2)     Status: Abnormal   Collection Time: 01/31/16  6:25 AM  Result Value Ref Range Status   Specimen Description BLOOD LEFT HAND  Final   Special Requests IN PEDIATRIC BOTTLE 1CC  Final   Culture  Setup Time   Final    GRAM POSITIVE COCCI IN CLUSTERS AEROBIC BOTTLE ONLY CRITICAL VALUE NOTED.  VALUE IS CONSISTENT WITH PREVIOUSLY REPORTED AND CALLED VALUE.    Culture (A)  Final    STAPHYLOCOCCUS AUREUS SUSCEPTIBILITIES PERFORMED ON PREVIOUS CULTURE WITHIN THE LAST 5 DAYS.    Report Status 02/02/2016 FINAL  Final  Culture, blood (routine x 2)     Status: None (Preliminary result)   Collection Time: 02/01/16 10:48 AM  Result Value  Ref Range Status   Specimen Description BLOOD RIGHT ARM  Final   Special Requests   Final    BOTTLES DRAWN AEROBIC AND ANAEROBIC 10CC BLUE 5 CC RED   Culture NO GROWTH 3 DAYS  Final   Report Status PENDING  Incomplete  Culture, blood (routine x 2)     Status: None (Preliminary result)   Collection Time: 02/01/16 10:55 AM  Result Value Ref Range Status   Specimen Description BLOOD LEFT HAND  Final   Special Requests IN PEDIATRIC BOTTLE 3CC  Final   Culture NO GROWTH 3 DAYS  Final   Report Status PENDING  Incomplete     Studies: No results found.  Scheduled Meds: . amLODipine  10 mg Oral Daily  . enoxaparin (LOVENOX) injection  40 mg Subcutaneous Q24H  . lidocaine  1 patch Transdermal Q24H  . LORazepam  1 mg Intravenous Once  . metoprolol succinate  50 mg Oral Daily  . naproxen  375 mg Oral BID WC  . polyethylene glycol  17 g Oral BID  . senna-docusate  1 tablet Oral BID  . tamsulosin  0.4 mg Oral QPC breakfast  . vancomycin  1,250 mg Intravenous Q12H   Continuous Infusions:    Principal Problem:   MRSA bacteremia Active Problems:   Rheumatoid arthritis involving multiple sites with positive rheumatoid factor (HCC)   Essential hypertension   Hyponatremia   Left adrenal mass (HCC)   Thoracic discitis   Hyperglycemia   Normocytic anemia   Thrombocytopenia (HCC)   Atherosclerotic peripheral vascular disease (HCC)   Degenerative joint disease of spine   BPH (benign prostatic hyperplasia)   Ileus ([place])    Time spent: 82min    Irwin Toran  [phone] [facility]  If 7PM-7AM, please contact night-coverage at [email or web address], password [facility] 02/05/2016, 1:39 PM  LOS: 7 days

## 2016-02-05 NOTE — Progress Notes (Signed)
Patient ID: Tanner Rice, male   DOB: Dec 22, 1933, 82 y.o.   MRN: [id number]          [facility]    Date of Admission:  01/28/2016           Day 7 vancomycin  Principal Problem:   MRSA bacteremia Active Problems:   Epigastric pain   Rheumatoid arthritis involving multiple sites with positive rheumatoid factor (HCC)   Essential hypertension   Hyponatremia   Left adrenal mass (HCC)   Hyperglycemia   Normocytic anemia   Thrombocytopenia (HCC)   Atherosclerotic peripheral vascular disease (HCC)   Degenerative joint disease of spine   BPH (benign prostatic hyperplasia)   Ileus ([place])   . amLODipine  10 mg Oral Daily  . enoxaparin (LOVENOX) injection  40 mg Subcutaneous Q24H  . lidocaine  1 patch Transdermal Q24H  . LORazepam  1 mg Intravenous Once  . metoprolol succinate  50 mg Oral Daily  . naproxen  375 mg Oral BID WC  . polyethylene glycol  17 g Oral BID  . senna-docusate  1 tablet Oral BID  . tamsulosin  0.4 mg Oral QPC breakfast  . vancomycin  1,250 mg Intravenous Q12H    SUBJECTIVE: He feels like his pain may be slightly better but it still remains quite severe. He sat up for about 2 hours yesterday.  Review of Systems: Review of Systems  Constitutional: Negative for chills, diaphoresis and fever.  Respiratory: Negative for cough, sputum production and shortness of breath.   Cardiovascular: Positive for chest pain.  Gastrointestinal: Negative for abdominal pain, diarrhea, nausea and vomiting.  Neurological: Negative for sensory change and focal weakness.    Past Medical History:  Diagnosis Date  . Hypertension   . Melanoma ([place])   . Rheumatoid arthritis(714.0)     Social History  Substance Use Topics  . Smoking status: Former Research scientist (life sciences)  . Smokeless tobacco: Never Used  . Alcohol use Yes     Comment: glass of wine a day     Family History  Problem Relation Age of Onset  . Prostate cancer Father   . Prostate cancer Brother     . Osteoarthritis Brother    Allergies  Allergen Reactions  . Other Diarrhea, Nausea And Vomiting and Other (See Comments)    Seafood - muscles   . Penicillins Cross Reactors Hives    OBJECTIVE: Vitals:   02/04/16 1447 02/04/16 2117 02/04/16 2311 02/05/16 0532  BP: (!) 192/84 (!) 157/78 (!) 172/79 (!) 176/89  Pulse: 91 83 78 81  Resp: 18 18  18   Temp: 98.3 F (36.8 C) 98.8 F (37.1 C)  98.1 F (36.7 C)  TempSrc: Oral Oral  Oral  SpO2: 95% 97%  91%  Weight:      Height:       Body mass index is 27.15 kg/m.  Physical Exam  Constitutional:  He appears a little bit more comfortable. He was able to get himself out of bed and sit on the bedside toilet while I was in the room.  Cardiovascular: Normal rate and regular rhythm.   No murmur heard. Pulmonary/Chest: Effort normal and breath sounds normal.  Musculoskeletal: Normal range of motion. He exhibits no edema or tenderness.  Neurological: He is alert.  Skin: No rash noted.  Psychiatric: Mood and affect normal.    Lab Results Lab Results  Component Value Date   WBC 8.2 02/05/2016   HGB 11.4 (L) 02/05/2016   HCT  34.2 (L) 02/05/2016   MCV 93.7 02/05/2016   PLT 169 02/05/2016    Lab Results  Component Value Date   CREATININE 0.57 (L) 02/05/2016   BUN 9 02/05/2016   NA 132 (L) 02/05/2016   K 3.4 (L) 02/05/2016   CL 94 (L) 02/05/2016   CO2 30 02/05/2016    Lab Results  Component Value Date   ALT 17 02/01/2016   AST 22 02/01/2016   ALKPHOS 56 02/01/2016   BILITOT 1.5 (H) 02/01/2016     Microbiology: Recent Results (from the past 240 hour(s))  Culture, blood (routine x 2)     Status: Abnormal   Collection Time: 01/29/16  4:02 AM  Result Value Ref Range Status   Specimen Description BLOOD LEFT ANTECUBITAL  Final   Special Requests BOTTLES DRAWN AEROBIC AND ANAEROBIC 5CC   Final   Culture  Setup Time   Final    GRAM POSITIVE COCCI IN CLUSTERS IN BOTH AEROBIC AND ANAEROBIC BOTTLES Organism ID to  follow CRITICAL RESULT CALLED TO, READ BACK BY AND VERIFIED WITH: M BELL 01/29/16 @ [place] (A)  Final   Report Status 01/31/2016 FINAL  Final   Organism ID, Bacteria METHICILLIN RESISTANT STAPHYLOCOCCUS AUREUS  Final      Susceptibility   Methicillin resistant staphylococcus aureus - MIC*    CIPROFLOXACIN >=8 RESISTANT Resistant     ERYTHROMYCIN >=8 RESISTANT Resistant     GENTAMICIN <=0.5 SENSITIVE Sensitive     OXACILLIN >=4 RESISTANT Resistant     TETRACYCLINE <=1 SENSITIVE Sensitive     VANCOMYCIN 1 SENSITIVE Sensitive     TRIMETH/SULFA <=10 SENSITIVE Sensitive     CLINDAMYCIN <=0.25 SENSITIVE Sensitive     RIFAMPIN <=0.5 SENSITIVE Sensitive     Inducible Clindamycin NEGATIVE Sensitive     * METHICILLIN RESISTANT STAPHYLOCOCCUS AUREUS  Blood Culture ID Panel (Reflexed)     Status: Abnormal   Collection Time: 01/29/16  4:02 AM  Result Value Ref Range Status   Enterococcus species NOT DETECTED NOT DETECTED Final   Vancomycin resistance NOT DETECTED NOT DETECTED Final   Listeria monocytogenes NOT DETECTED NOT DETECTED Final   Staphylococcus species DETECTED (A) NOT DETECTED Final    Comment: CRITICAL RESULT CALLED TO, READ BACK BY AND VERIFIED WITH: M BELL 01/29/16 @ 1929 M VESTAL    Staphylococcus aureus DETECTED (A) NOT DETECTED Final    Comment: CRITICAL RESULT CALLED TO, READ BACK BY AND VERIFIED WITH: M BELL 01/29/16 @ 1929 M VESTAL    Methicillin resistance DETECTED (A) NOT DETECTED Final    Comment: CRITICAL RESULT CALLED TO, READ BACK BY AND VERIFIED WITH: M BELL 01/29/16 @ 1929 M VESTAL    Streptococcus species NOT DETECTED NOT DETECTED Final   Streptococcus agalactiae NOT DETECTED NOT DETECTED Final   Streptococcus pneumoniae NOT DETECTED NOT DETECTED Final   Streptococcus pyogenes NOT DETECTED NOT DETECTED Final   Acinetobacter baumannii NOT DETECTED NOT DETECTED Final   Enterobacteriaceae species NOT  DETECTED NOT DETECTED Final   Enterobacter cloacae complex NOT DETECTED NOT DETECTED Final   Escherichia coli NOT DETECTED NOT DETECTED Final   Klebsiella oxytoca NOT DETECTED NOT DETECTED Final   Klebsiella pneumoniae NOT DETECTED NOT DETECTED Final   Proteus species NOT DETECTED NOT DETECTED Final   Serratia marcescens NOT DETECTED NOT DETECTED Final   Carbapenem resistance NOT DETECTED NOT DETECTED Final   Haemophilus influenzae NOT DETECTED NOT DETECTED Final   Neisseria  meningitidis NOT DETECTED NOT DETECTED Final   Pseudomonas aeruginosa NOT DETECTED NOT DETECTED Final   Candida albicans NOT DETECTED NOT DETECTED Final   Candida glabrata NOT DETECTED NOT DETECTED Final   Candida krusei NOT DETECTED NOT DETECTED Final   Candida parapsilosis NOT DETECTED NOT DETECTED Final   Candida tropicalis NOT DETECTED NOT DETECTED Final  Culture, blood (routine x 2)     Status: Abnormal   Collection Time: 01/29/16  4:05 AM  Result Value Ref Range Status   Specimen Description BLOOD LEFT ANTECUBITAL  Final   Special Requests BOTTLES DRAWN AEROBIC AND ANAEROBIC 5CC   Final   Culture  Setup Time   Final    GRAM POSITIVE COCCI IN CLUSTERS IN BOTH AEROBIC AND ANAEROBIC BOTTLES CRITICAL VALUE NOTED.  VALUE IS CONSISTENT WITH PREVIOUSLY REPORTED AND CALLED VALUE.    Culture (A)  Final    STAPHYLOCOCCUS AUREUS SUSCEPTIBILITIES PERFORMED ON PREVIOUS CULTURE WITHIN THE LAST 5 DAYS.    Report Status 01/31/2016 FINAL  Final  MRSA culture     Status: None   Collection Time: 01/30/16  1:16 PM  Result Value Ref Range Status   Specimen Description NASAL SWAB  Final   Special Requests NONE  Final   Culture NEGATIVE  Final   Report Status 01/31/2016 FINAL  Final  Culture, blood (routine x 2)     Status: Abnormal   Collection Time: 01/31/16  6:20 AM  Result Value Ref Range Status   Specimen Description BLOOD LEFT ANTECUBITAL  Final   Special Requests IN PEDIATRIC BOTTLE 3CC  Final   Culture  Setup  Time   Final    GRAM POSITIVE COCCI IN CLUSTERS AEROBIC BOTTLE ONLY CRITICAL RESULT CALLED TO, READ BACK BY AND VERIFIED WITH: A MYER 01/31/16 @ 2119 M VESTAL    Culture (A)  Final    STAPHYLOCOCCUS AUREUS SUSCEPTIBILITIES PERFORMED ON PREVIOUS CULTURE WITHIN THE LAST 5 DAYS.    Report Status 02/02/2016 FINAL  Final  Culture, blood (routine x 2)     Status: Abnormal   Collection Time: 01/31/16  6:25 AM  Result Value Ref Range Status   Specimen Description BLOOD LEFT HAND  Final   Special Requests IN PEDIATRIC BOTTLE 1CC  Final   Culture  Setup Time   Final    GRAM POSITIVE COCCI IN CLUSTERS AEROBIC BOTTLE ONLY CRITICAL VALUE NOTED.  VALUE IS CONSISTENT WITH PREVIOUSLY REPORTED AND CALLED VALUE.    Culture (A)  Final    STAPHYLOCOCCUS AUREUS SUSCEPTIBILITIES PERFORMED ON PREVIOUS CULTURE WITHIN THE LAST 5 DAYS.    Report Status 02/02/2016 FINAL  Final  Culture, blood (routine x 2)     Status: None (Preliminary result)   Collection Time: 02/01/16 10:48 AM  Result Value Ref Range Status   Specimen Description BLOOD RIGHT ARM  Final   Special Requests   Final    BOTTLES DRAWN AEROBIC AND ANAEROBIC 10CC BLUE 5 CC RED   Culture NO GROWTH 3 DAYS  Final   Report Status PENDING  Incomplete  Culture, blood (routine x 2)     Status: None (Preliminary result)   Collection Time: 02/01/16 10:55 AM  Result Value Ref Range Status   Specimen Description BLOOD LEFT HAND  Final   Special Requests IN PEDIATRIC BOTTLE 3CC  Final   Culture NO GROWTH 3 DAYS  Final   Report Status PENDING  Incomplete   Thoracic MRI 02/02/2016  IMPRESSION: Discitis and osteomyelitis at T8-9. Ventral epidural abscess extending  to the right of midline causes spinal stenosis and cord compression. Cord signal normal. Abscess extends anterior to the disc space in the paraspinous soft tissues which are edematous. No other areas of infection in the thoracic or lumbar spine.  Lumbar degenerative changes as above  without acute abnormality in the lumbar spine  Moderately large bilateral pleural effusions.  Critical Value/emergent results were called by telephone at the time of interpretation on 02/02/2016 at 8:16 pm to Walden Field NP , who verbally acknowledged these results.    By: Franchot Gallo M.D.   On: 02/02/2016 20:17  ASSESSMENT: He has MRSA bacteremia complicated by thoracic spine infection. I anticipate very slow improvement in his pain. His most recent blood cultures are negative. I will place an order for PICC insertion and plan on a minimum of 6 weeks of IV vancomycin.  PLAN: 1. PICC placement 2. Continue IV vancomycin 3. Check sedimentation rate and C-reactive protein  Michel Bickers, MD [facility] [place] [phone] pager   [phone] cell 02/05/2016, 11:29 AM

## 2016-02-06 ENCOUNTER — Inpatient Hospital Stay (HOSPITAL_COMMUNITY): Payer: Medicare Other

## 2016-02-06 LAB — CULTURE, BLOOD (ROUTINE X 2)
CULTURE: NO GROWTH
Culture: NO GROWTH

## 2016-02-06 LAB — VANCOMYCIN, TROUGH: Vancomycin Tr: 16 ug/mL (ref 15–20)

## 2016-02-06 MED ORDER — SODIUM CHLORIDE 0.9% FLUSH
10.0000 mL | INTRAVENOUS | Status: DC | PRN
  Administered 2016-02-07: 10 mL
  Filled 2016-02-06: qty 40

## 2016-02-06 MED ORDER — ACETAMINOPHEN 325 MG PO TABS: 650.0000 mg | ORAL_TABLET | Freq: Four times a day (QID) | ORAL | Status: DC | PRN

## 2016-02-06 MED ORDER — SENNOSIDES-DOCUSATE SODIUM 8.6-50 MG PO TABS: 1.0000 | ORAL_TABLET | Freq: Two times a day (BID) | ORAL | 0 refills | Status: DC

## 2016-02-06 MED ORDER — HYDRALAZINE HCL 50 MG PO TABS
50.0000 mg | ORAL_TABLET | Freq: Three times a day (TID) | ORAL | Status: DC
  Administered 2016-02-06 – 2016-02-07 (×2): 50 mg via ORAL
  Filled 2016-02-06 (×2): qty 1

## 2016-02-06 MED ORDER — VANCOMYCIN HCL 10 G IV SOLR: 1250.0000 mg | Freq: Two times a day (BID) | INTRAVENOUS | 0 refills | Status: DC

## 2016-02-06 MED ORDER — NAPROXEN 375 MG PO TABS: 375.0000 mg | ORAL_TABLET | Freq: Two times a day (BID) | ORAL | 0 refills | Status: DC

## 2016-02-06 MED ORDER — POLYETHYLENE GLYCOL 3350 17 G PO PACK: 17.0000 g | PACK | Freq: Two times a day (BID) | ORAL | 0 refills | Status: DC

## 2016-02-06 MED ORDER — VANCOMYCIN HCL 10 G IV SOLR
1250.0000 mg | Freq: Two times a day (BID) | INTRAVENOUS | Status: DC
  Administered 2016-02-06 – 2016-02-07 (×2): 1250 mg via INTRAVENOUS
  Filled 2016-02-06 (×4): qty 1250

## 2016-02-06 MED ORDER — OXYCODONE HCL 5 MG PO TABS: 5.0000 mg | ORAL_TABLET | ORAL | 0 refills | Status: DC | PRN

## 2016-02-06 MED ORDER — HYDRALAZINE HCL 50 MG PO TABS: 50.0000 mg | ORAL_TABLET | Freq: Three times a day (TID) | ORAL | 0 refills | Status: DC

## 2016-02-06 MED ORDER — LIDOCAINE 5 % EX PTCH: 1.0000 | MEDICATED_PATCH | CUTANEOUS | 0 refills | Status: DC

## 2016-02-06 MED ORDER — AMLODIPINE BESYLATE 10 MG PO TABS: 10.0000 mg | ORAL_TABLET | Freq: Every day | ORAL | 0 refills | Status: DC

## 2016-02-06 NOTE — Discharge Summary (Signed)
Physician Discharge Summary  Tanner Rice [id number] DOB: 1933-07-19 DOA: 01/28/2016  PCP: Mathews Argyle, MD  Admit date: 01/28/2016 Discharge date: 02/07/2016  Time spent: 45 minutes  Recommendations for Outpatient Follow-up:  1. Will follow up with Dr.Campbell in 3weeks, discharged on  IV Vancomycin 1250 mg IV q 12 hours with anticipated stop date 03/13/2016.  2. PCP Dr.Stoneking in 1 week 3. Please monitor Bmet and Vanc trough per pharmacy protocol 4. Home Health services set up for prolonged IV administration of Vancomycin   Discharge Diagnoses:  Principal Problem:   MRSA bacteremia   T8-9 epidural abscess/discitis and osteomyelitis   Ileus   Rheumatoid arthritis involving multiple sites with positive rheumatoid factor (HCC)   Essential hypertension   Hyponatremia   Left adrenal mass (HCC)   Thoracic discitis   Hyperglycemia   Normocytic anemia   Thrombocytopenia (HCC)   Atherosclerotic peripheral vascular disease (HCC)   Degenerative joint disease of spine   BPH (benign prostatic hyperplasia)   Ileus ([place])   Discharge Condition: improved  Diet recommendation: soft diet advance as tolerated   Filed Weights   01/28/16 1710 01/29/16 0145 02/03/16 1424  Weight: 73.5 kg (162 lb) 75.4 kg (166 lb 3.2 oz) 76.3 kg (168 lb 3.2 oz)    History of present illness:  Chief Complaint: Abdominal pain  HPI: JENNINGS MAKRIS is a 82 y.o. male with a past medical history significant for RA on Remicade and methotrexate, HTN and prior melanoma who presented with fever and abdominal pain. The patient was in his usual state of health until Saturday night when he developed some epigastric and bilateral rib pain. This didn't improve with a heating pad but went away overnight but returned around midday today, was intense, constant, epigastric and across the top of the abdomen,  so he came to the ER.   Hospital Course:  1. Sepsis/MRSA bacteremia: Initially presented as  abdominal pain and fever then his blood cultures returned positive 2 for MRSA.  -Treated with Iv Vanc Day 8, repeat Blood Cx 8/23 with MRSA again -repeat Blood cultures from 8/24 finally-NGTD -ID consulted, followed by Dr.Campbell, had a rash on upper chest recently which resolved suspect this must have caused MRSA seeding. -MRI L and T spine showed T9 abscess and discitis/OM, will likely need at least 6weeks of IV Vancomycin -TEE negative for endocarditis -PICC line placed on 02/06/2016 -needs FU in ID clinic and home health for ABx management. -Plan for a total of 6 weeks of IV vancomycin, anticipated stop date: 03/13/2016 -Home Health services were set up prior to discharge for prolonged Iv administration   2. T 8-9 Discitis and osteomyelitis with epidural abscess and cord compression, cord signal normal -no neuro deficits from this at this time, Neurosurgery consulted, seen by Dr.Jones, due to considerable risks given age/co-morbidities, no plan for Surgery at this time, Abx Rx recommended -pain control, lidocaine patch, Naproxen and oxycodone PRN -PT eval completed, now ambulating some with a walker, home PT recommended  3. Ileus: -improved, was NPO with NGT initially - NG pulled out early 8/25  -now having BMs and tolerating diet -advanced to soft diet -Ct abd pelvis on admission was normal  2. Hyponatremia: -stable, now off IVF, improving  3. HTN: -resumed amlodipine, lisinopril and metoprolol  4. RA: -Hold methotrexate -On Remicade  5. Left adrenal mass: -Adrenal protocol CT as an outpatient once all acute issues resolved  Procedures:    Consultations:  Neurosurgery Dr.Jones  ID Dr.Campbell  Discharge  Exam: Vitals:   02/07/16 0624 02/07/16 1254  BP: (!) 168/75 (!) 174/87  Pulse: 81   Resp: (!) 22   Temp: 97.7 F (36.5 C)     General: AAOx3, chronically ill appearing Cardiovascular: S1S2/RRR Respiratory: CTAB  Discharge  Instructions   Discharge Instructions    Call MD for:    Complete by:  As directed   Call MD for:  difficulty breathing, headache or visual disturbances    Complete by:  As directed   Call MD for:  extreme fatigue    Complete by:  As directed   Call MD for:  hives    Complete by:  As directed   Call MD for:  persistant dizziness or light-headedness    Complete by:  As directed   Call MD for:  persistant nausea and vomiting    Complete by:  As directed   Call MD for:  redness, tenderness, or signs of infection (pain, swelling, redness, odor or green/yellow discharge around incision site)    Complete by:  As directed   Call MD for:  severe uncontrolled pain    Complete by:  As directed   Call MD for:  temperature >100.4    Complete by:  As directed   Diet - low sodium heart healthy    Complete by:  As directed   Increase activity slowly    Complete by:  As directed     Current Discharge Medication List    START taking these medications   Details  acetaminophen (TYLENOL) 325 MG tablet Take 2 tablets (650 mg total) by mouth every 6 (six) hours as needed for mild pain (or Fever >/= 101).    lidocaine (LIDODERM) 5 % Place 1 patch onto the skin daily. Remove & Discard patch within 12 hours or as directed by MD Qty: 15 patch, Refills: 0    naproxen (NAPROSYN) 375 MG tablet Take 1 tablet (375 mg total) by mouth 2 (two) times daily with a meal. For 5 days Qty: 10 tablet, Refills: 0    oxyCODONE (OXY IR/ROXICODONE) 5 MG immediate release tablet Take 1 tablet (5 mg total) by mouth every 4 (four) hours as needed for moderate pain. Qty: 30 tablet, Refills: 0    polyethylene glycol (MIRALAX / GLYCOLAX) packet Take 17 g by mouth 2 (two) times daily. Qty: 28 each, Refills: 0    senna-docusate (SENOKOT-S) 8.6-50 MG tablet Take 1 tablet by mouth 2 (two) times daily. Qty: 30 tablet, Refills: 0    vancomycin 1,250 mg in sodium chloride 0.9 % 250 mL Inject 1,250 mg into the vein every 12 (twelve)  hours. Please give 1,250 mg IV q 12 hours, anticipated stop date 03/13/2016 Qty Sufficient Qty: 30 g, Refills: 0      CONTINUE these medications which have CHANGED   Details  amLODipine (NORVASC) 10 MG tablet Take 1 tablet (10 mg total) by mouth daily. Qty: 30 tablet, Refills: 0    metoprolol succinate (TOPROL-XL) 50 MG 24 hr tablet Take 1 tablet (50 mg total) by mouth daily. Take with or immediately following a meal. Qty: 60 tablet, Refills: 1      CONTINUE these medications which have NOT CHANGED   Details  aspirin EC 81 MG tablet Take 81 mg by mouth daily.    calcium citrate-vitamin D (CITRACAL+D) 315-200 MG-UNIT tablet Take 1 tablet by mouth 2 (two) times a week.    cyanocobalamin 500 MCG tablet Take 500 mcg by mouth daily.  glucosamine-chondroitin 500-400 MG tablet Take 1 tablet by mouth daily.    lisinopril (PRINIVIL,ZESTRIL) 10 MG tablet Take 10 mg by mouth daily.      Melatonin 3 MG TABS Take 3 mg by mouth at bedtime.     minocycline (MINOCIN,DYNACIN) 100 MG capsule Take 100 mg by mouth daily as needed (for flare ups).     Multiple Vitamin (MULTIVITAMIN) tablet Take 1 tablet by mouth daily.      tamsulosin (FLOMAX) 0.4 MG CAPS capsule Take 0.4 mg by mouth daily after supper.      STOP taking these medications     betamethasone dipropionate (DIPROLENE) 0.05 % cream      inFLIXimab (REMICADE) 100 MG injection      methotrexate (RHEUMATREX) 2.5 MG tablet        Allergies  Allergen Reactions  . Other Diarrhea, Nausea And Vomiting and Other (See Comments)    Seafood - muscles   . Penicillins Cross Reactors Hives   Follow-up Information    Mathews Argyle, MD. Schedule an appointment as soon as possible for a visit in 1 week(s).   Specialty:  Internal Medicine Contact information: [address] [place] [address] [phone]        Michel Bickers, MD. Schedule an appointment as soon as possible for a visit in 3 week(s).    Specialty:  Infectious Diseases Contact information: [address] [place] [address] [place] .   Why:  Home health RN and PT arranged Contact information: [address] [place] [address] [phone]            The results of significant diagnostics from this hospitalization (including imaging, microbiology, ancillary and laboratory) are listed below for reference.    Significant Diagnostic Studies: Dg Chest 2 View  Result Date: 01/30/2016 CLINICAL DATA:  Bilateral lower chest pain and epigastric pain. EXAM: CHEST  2 VIEW COMPARISON:  01/28/2016 FINDINGS: There is cardiomegaly. The patient has developed bilateral pleural effusions with lower lobe atelectasis and/or pneumonia. Upper lungs are clear. IMPRESSION: Development of bilateral pleural effusions with lower lobe atelectasis and/or pneumonia. Electronically Signed   By: Nelson Chimes M.D.   On: 01/30/2016 11:23   Dg Abd 1 View  Result Date: 01/31/2016 CLINICAL DATA:  Abdominal pain and constipation EXAM: ABDOMEN - 1 VIEW COMPARISON:  01/30/2016 FINDINGS: Scattered large and small bowel gas is noted stable from the previous exam consistent with a generalized ileus. Fecal material is noted within the colon and stable. Degenerative changes of lumbar spine are seen. No free air is noted. IMPRESSION: Stable ileus. Electronically Signed   By: Inez Catalina M.D.   On: 01/31/2016 11:48   Dg Abd 1 View  Result Date: 01/30/2016 CLINICAL DATA:  Chest pain and epigastric pain, worsening recently. EXAM: ABDOMEN - 1 VIEW COMPARISON:  01/28/2016 FINDINGS: There is no ileus pattern with air throughout the small and large bowel. No evidence of obstruction. Urinary tract con stress is noted. Previously administered oral contrast present in the right colon. No acute bone finding. IMPRESSION: Diffuse ileus pattern. Electronically Signed   By: Nelson Chimes M.D.   On:  01/30/2016 11:24   Mr Thoracic Spine W Wo Contrast  Result Date: 02/02/2016 CLINICAL DATA:  Severe back pain.  Blood cultures positive for MRSA EXAM: MRI THORACIC AND LUMBAR SPINE WITHOUT AND WITH CONTRAST TECHNIQUE: Multiplanar and multiecho pulse sequences of the thoracic  and lumbar spine were obtained without and with intravenous contrast. CONTRAST:  68mL MULTIHANCE GADOBENATE DIMEGLUMINE 529 MG/ML IV SOLN COMPARISON:  CT abdomen pelvis 01/28/2016 FINDINGS: MRI THORACIC SPINE FINDINGS Alignment: Mild anterior listhesis T1-2 and T2-3 related to disc and facet degeneration. Remaining alignment normal. Vertebrae: Mild vertebral body edema at T8-9 with associated disc space changes compatible with discitis and osteomyelitis. Mild enhancement of the bone marrow at T8-9. Hemangioma T12 vertebral body. Hemangioma T4 vertebral body. 6 mm bone marrow lesion in the T6 vertebral body which shows mild enhancement. This may represent a hemangioma but it is not hyperintense on T1 prior to contrast. Cord: Cord compression at T8-9 related to epidural abscess. Cord signal normal. Paraspinal and other soft tissues: Discitis and osteomyelitis at T8-9. Ventral epidural abscess extends to the right of midline and is causing cord compression. Epidural abscess also extends anterior to the vertebral bodies into the paraspinous soft tissues. There is edema at in the paraspinous soft tissues anteriorly. Moderately large pleural effusions bilaterally. Disc levels: Discitis and osteomyelitis at T8-9 with epidural abscess and cord compression. No other levels of infection identified. No fracture identified. MRI LUMBAR SPINE FINDINGS Segmentation:  Normal segmentation Alignment: Mild anterior slip at L4-5 and L5-S1. Mild retrolisthesis L2-3 and L3-4. Vertebrae: Negative for fracture or mass. Hemangioma T12 vertebral body. Mild enhancement around a Schmorl's node at L2-3. No evidence of discitis or osteomyelitis in the lumbar spine.  Negative for metastatic disease. Conus medullaris: Extends to the T12-L1 level and appears normal. Paraspinal and other soft tissues: Paraspinous muscles are normal. No retroperitoneal mass or adenopathy or fluid collection. Disc levels: T12-L1:  Mild disc and mild facet degeneration L2-3: Mild disc and facet degeneration. Mild left foraminal narrowing. L2-3: Diffuse disc bulging and facet degeneration. Mild spinal stenosis. Mild left foraminal narrowing. L3-4: Disc degeneration with endplate osteophyte formation. Mild facet degeneration. Mild foraminal narrowing bilaterally. L4-5: Disc degeneration with diffuse disc bulging. Advanced facet arthrosis bilaterally with facet overgrowth and bilateral facet joint effusions. Moderate spinal stenosis. Moderate to severe subarticular and foraminal stenosis bilaterally right greater than left. L5-S1: Grade 1 anterior listhesis with advanced facet arthrosis. Mild foraminal encroachment bilaterally. IMPRESSION: Discitis and osteomyelitis at T8-9. Ventral epidural abscess extending to the right of midline causes spinal stenosis and cord compression. Cord signal normal. Abscess extends anterior to the disc space in the paraspinous soft tissues which are edematous. No other areas of infection in the thoracic or lumbar spine. Lumbar degenerative changes as above without acute abnormality in the lumbar spine Moderately large bilateral pleural effusions. Critical Value/emergent results were called by telephone at the time of interpretation on 02/02/2016 at 8:16 pm to Walden Field NP , who verbally acknowledged these results. Electronically Signed   By: Franchot Gallo M.D.   On: 02/02/2016 20:17   Mr Lumbar Spine W Wo Contrast  Result Date: 02/02/2016 CLINICAL DATA:  Severe back pain.  Blood cultures positive for MRSA EXAM: MRI THORACIC AND LUMBAR SPINE WITHOUT AND WITH CONTRAST TECHNIQUE: Multiplanar and multiecho pulse sequences of the thoracic and lumbar spine were obtained  without and with intravenous contrast. CONTRAST:  92mL MULTIHANCE GADOBENATE DIMEGLUMINE 529 MG/ML IV SOLN COMPARISON:  CT abdomen pelvis 01/28/2016 FINDINGS: MRI THORACIC SPINE FINDINGS Alignment: Mild anterior listhesis T1-2 and T2-3 related to disc and facet degeneration. Remaining alignment normal. Vertebrae: Mild vertebral body edema at T8-9 with associated disc space changes compatible with discitis and osteomyelitis. Mild enhancement of the bone marrow at T8-9. Hemangioma T12 vertebral body.  Hemangioma T4 vertebral body. 6 mm bone marrow lesion in the T6 vertebral body which shows mild enhancement. This may represent a hemangioma but it is not hyperintense on T1 prior to contrast. Cord: Cord compression at T8-9 related to epidural abscess. Cord signal normal. Paraspinal and other soft tissues: Discitis and osteomyelitis at T8-9. Ventral epidural abscess extends to the right of midline and is causing cord compression. Epidural abscess also extends anterior to the vertebral bodies into the paraspinous soft tissues. There is edema at in the paraspinous soft tissues anteriorly. Moderately large pleural effusions bilaterally. Disc levels: Discitis and osteomyelitis at T8-9 with epidural abscess and cord compression. No other levels of infection identified. No fracture identified. MRI LUMBAR SPINE FINDINGS Segmentation:  Normal segmentation Alignment: Mild anterior slip at L4-5 and L5-S1. Mild retrolisthesis L2-3 and L3-4. Vertebrae: Negative for fracture or mass. Hemangioma T12 vertebral body. Mild enhancement around a Schmorl's node at L2-3. No evidence of discitis or osteomyelitis in the lumbar spine. Negative for metastatic disease. Conus medullaris: Extends to the T12-L1 level and appears normal. Paraspinal and other soft tissues: Paraspinous muscles are normal. No retroperitoneal mass or adenopathy or fluid collection. Disc levels: T12-L1:  Mild disc and mild facet degeneration L2-3: Mild disc and facet  degeneration. Mild left foraminal narrowing. L2-3: Diffuse disc bulging and facet degeneration. Mild spinal stenosis. Mild left foraminal narrowing. L3-4: Disc degeneration with endplate osteophyte formation. Mild facet degeneration. Mild foraminal narrowing bilaterally. L4-5: Disc degeneration with diffuse disc bulging. Advanced facet arthrosis bilaterally with facet overgrowth and bilateral facet joint effusions. Moderate spinal stenosis. Moderate to severe subarticular and foraminal stenosis bilaterally right greater than left. L5-S1: Grade 1 anterior listhesis with advanced facet arthrosis. Mild foraminal encroachment bilaterally. IMPRESSION: Discitis and osteomyelitis at T8-9. Ventral epidural abscess extending to the right of midline causes spinal stenosis and cord compression. Cord signal normal. Abscess extends anterior to the disc space in the paraspinous soft tissues which are edematous. No other areas of infection in the thoracic or lumbar spine. Lumbar degenerative changes as above without acute abnormality in the lumbar spine Moderately large bilateral pleural effusions. Critical Value/emergent results were called by telephone at the time of interpretation on 02/02/2016 at 8:16 pm to Walden Field NP , who verbally acknowledged these results. Electronically Signed   By: Franchot Gallo M.D.   On: 02/02/2016 20:17   Ct Abdomen Pelvis W Contrast  Result Date: 01/28/2016 CLINICAL DATA:  Aching upper abdominal pain EXAM: CT ABDOMEN AND PELVIS WITH CONTRAST TECHNIQUE: Multidetector CT imaging of the abdomen and pelvis was performed using the standard protocol following bolus administration of intravenous contrast. CONTRAST:  165mL ISOVUE-300 IOPAMIDOL (ISOVUE-300) INJECTION 61% COMPARISON:  None. FINDINGS: Lower chest and abdominal wall: Small fatty umbilical hernia. Small fatty inguinal hernias. Nonspecific subpleural reticulation at the bases without honeycombing. Hepatobiliary: No focal liver  abnormality.No evidence of biliary obstruction or stone. Pancreas: Unremarkable. Spleen: Unremarkable. Adrenals/Urinary Tract: 22 mm left adrenal nodule with nonspecific appearance on this postcontrast only exam. No hydronephrosis or stone. 29 mm left lower pole cortical and hilar cyst. Smaller right renal cyst. Unremarkable bladder. Stomach/Bowel: No obstruction. Colonic diverticulosis. Appendix not well seen; no pericecal inflammation. Reproductive:Marked prostate enlargement, deforming the bladder base, with nonspecific heterogeneity. Vascular/Lymphatic: No acute vascular abnormality. Aortic and branch vessel atherosclerotic calcification. No mass or adenopathy. Other: No ascites or pneumoperitoneum. Musculoskeletal: Advanced lumbar facet arthropathy with mild L3-4 and L4-5 listhesis. Spondylosis. No acute or aggressive process. IMPRESSION: 1. No acute finding. 2. **An incidental  finding of potential clinical significance has been found. 2.2 cm left adrenal mass with nonspecific imaging characteristics. If no outside comparison to document stability, adrenal protocol CT is recommended in this patient with reported history of melanoma.** 3.  Aortic Atherosclerosis (ICD10-170.0) 4. Prominent prostate enlargement Electronically Signed   By: Monte Fantasia M.D.   On: 01/28/2016 20:08   Dg Chest Port 1 View  Result Date: 02/06/2016 CLINICAL DATA:  Status post line placement.  Evaluate position. EXAM: PORTABLE CHEST 1 VIEW COMPARISON:  Chest x-ray dated 01/30/2016. FINDINGS: Left-sided PICC line in place with tip adequately positioned at the level of the mid SVC. Left lung is clear. No pneumothorax. Vague opacity at the right lung base is favored to represent a combination of small layering pleural effusion and atelectasis. Mild cardiomegaly is stable. IMPRESSION: 1. Left-sided PICC line placement. Tip of the PICC line appears adequately positioned at the level of the mid SVC. 2. Stable cardiomegaly. 3. Vague  opacity at the right lung base, likely small layering pleural effusion and/or atelectasis. Left lung is clear. No pneumothorax. Electronically Signed   By: Franki Cabot M.D.   On: 02/06/2016 16:42   Dg Abdomen Acute W/chest  Result Date: 01/28/2016 CLINICAL DATA:  Epigastric pain for several hours EXAM: DG ABDOMEN ACUTE W/ 1V CHEST COMPARISON:  None. FINDINGS: Cardiac shadow is within normal limits. The lungs are well aerated bilaterally. No focal infiltrate or sizable effusion is seen. No free air is noted. Scattered large and small bowel gas is seen. Degenerative changes of the lumbar spine are noted. IMPRESSION: No acute abnormality noted. Electronically Signed   By: Inez Catalina M.D.   On: 01/28/2016 18:13   Dg Abd Portable 1v  Result Date: 02/02/2016 CLINICAL DATA:  Ileus.  Constipation.  Duration of symptoms 6 days. EXAM: PORTABLE ABDOMEN - 1 VIEW COMPARISON:  02/01/2016 FINDINGS: Gas throughout small and large bowel. Only a small amount of fecal matter noted in the colon. Findings could relate to mild ileus. No abnormal calcifications. Chronic degenerative changes of the spine. IMPRESSION: Probable mild ileus pattern.  No evidence of increased stool burden. Electronically Signed   By: Nelson Chimes M.D.   On: 02/02/2016 11:49   Dg Abd Portable 1v  Result Date: 02/01/2016 CLINICAL DATA:  Ileus. EXAM: PORTABLE ABDOMEN - 1 VIEW COMPARISON:  01/31/2016. FINDINGS: NG tube noted in stable position . Distended loops of small large bowel noted consistent adynamic ileus. No interim change prior exam. No free air. Degenerative changes lumbar spine and both hips. IMPRESSION: NG tube in stable position. Stable distended loops of small and large bowel consistent with adynamic ileus. No significant change from prior exam. Electronically Signed   By: Marcello Moores  Register   On: 02/01/2016 06:39   Dg Abd Portable 1v  Result Date: 01/31/2016 CLINICAL DATA:  Status post NG tube placement today. EXAM: PORTABLE  ABDOMEN - 1 VIEW COMPARISON:  None. FINDINGS: NG tube is in place in good position with the side-port in the stomach and tip in the fundus. IMPRESSION: NG tube in good position. Electronically Signed   By: Inge Rise M.D.   On: 01/31/2016 18:35    Microbiology: Recent Results (from the past 240 hour(s))  Culture, blood (routine x 2)     Status: Abnormal   Collection Time: 01/29/16  4:02 AM  Result Value Ref Range Status   Specimen Description BLOOD LEFT ANTECUBITAL  Final   Special Requests BOTTLES DRAWN AEROBIC AND ANAEROBIC 5CC   Final  Culture  Setup Time   Final    GRAM POSITIVE COCCI IN CLUSTERS IN BOTH AEROBIC AND ANAEROBIC BOTTLES Organism ID to follow CRITICAL RESULT CALLED TO, READ BACK BY AND VERIFIED WITH: M BELL 01/29/16 @ [place] (A)  Final   Report Status 01/31/2016 FINAL  Final   Organism ID, Bacteria METHICILLIN RESISTANT STAPHYLOCOCCUS AUREUS  Final      Susceptibility   Methicillin resistant staphylococcus aureus - MIC*    CIPROFLOXACIN >=8 RESISTANT Resistant     ERYTHROMYCIN >=8 RESISTANT Resistant     GENTAMICIN <=0.5 SENSITIVE Sensitive     OXACILLIN >=4 RESISTANT Resistant     TETRACYCLINE <=1 SENSITIVE Sensitive     VANCOMYCIN 1 SENSITIVE Sensitive     TRIMETH/SULFA <=10 SENSITIVE Sensitive     CLINDAMYCIN <=0.25 SENSITIVE Sensitive     RIFAMPIN <=0.5 SENSITIVE Sensitive     Inducible Clindamycin NEGATIVE Sensitive     * METHICILLIN RESISTANT STAPHYLOCOCCUS AUREUS  Blood Culture ID Panel (Reflexed)     Status: Abnormal   Collection Time: 01/29/16  4:02 AM  Result Value Ref Range Status   Enterococcus species NOT DETECTED NOT DETECTED Final   Vancomycin resistance NOT DETECTED NOT DETECTED Final   Listeria monocytogenes NOT DETECTED NOT DETECTED Final   Staphylococcus species DETECTED (A) NOT DETECTED Final    Comment: CRITICAL RESULT CALLED TO, READ BACK BY AND VERIFIED WITH: M BELL  01/29/16 @ 1929 M VESTAL    Staphylococcus aureus DETECTED (A) NOT DETECTED Final    Comment: CRITICAL RESULT CALLED TO, READ BACK BY AND VERIFIED WITH: M BELL 01/29/16 @ 1929 M VESTAL    Methicillin resistance DETECTED (A) NOT DETECTED Final    Comment: CRITICAL RESULT CALLED TO, READ BACK BY AND VERIFIED WITH: M BELL 01/29/16 @ 1929 M VESTAL    Streptococcus species NOT DETECTED NOT DETECTED Final   Streptococcus agalactiae NOT DETECTED NOT DETECTED Final   Streptococcus pneumoniae NOT DETECTED NOT DETECTED Final   Streptococcus pyogenes NOT DETECTED NOT DETECTED Final   Acinetobacter baumannii NOT DETECTED NOT DETECTED Final   Enterobacteriaceae species NOT DETECTED NOT DETECTED Final   Enterobacter cloacae complex NOT DETECTED NOT DETECTED Final   Escherichia coli NOT DETECTED NOT DETECTED Final   Klebsiella oxytoca NOT DETECTED NOT DETECTED Final   Klebsiella pneumoniae NOT DETECTED NOT DETECTED Final   Proteus species NOT DETECTED NOT DETECTED Final   Serratia marcescens NOT DETECTED NOT DETECTED Final   Carbapenem resistance NOT DETECTED NOT DETECTED Final   Haemophilus influenzae NOT DETECTED NOT DETECTED Final   Neisseria meningitidis NOT DETECTED NOT DETECTED Final   Pseudomonas aeruginosa NOT DETECTED NOT DETECTED Final   Candida albicans NOT DETECTED NOT DETECTED Final   Candida glabrata NOT DETECTED NOT DETECTED Final   Candida krusei NOT DETECTED NOT DETECTED Final   Candida parapsilosis NOT DETECTED NOT DETECTED Final   Candida tropicalis NOT DETECTED NOT DETECTED Final  Culture, blood (routine x 2)     Status: Abnormal   Collection Time: 01/29/16  4:05 AM  Result Value Ref Range Status   Specimen Description BLOOD LEFT ANTECUBITAL  Final   Special Requests BOTTLES DRAWN AEROBIC AND ANAEROBIC 5CC   Final   Culture  Setup Time   Final    GRAM POSITIVE COCCI IN CLUSTERS IN BOTH AEROBIC AND ANAEROBIC BOTTLES CRITICAL VALUE NOTED.  VALUE IS CONSISTENT WITH PREVIOUSLY  REPORTED AND CALLED VALUE.    Culture (  A)  Final    STAPHYLOCOCCUS AUREUS SUSCEPTIBILITIES PERFORMED ON PREVIOUS CULTURE WITHIN THE LAST 5 DAYS.    Report Status 01/31/2016 FINAL  Final  MRSA culture     Status: None   Collection Time: 01/30/16  1:16 PM  Result Value Ref Range Status   Specimen Description NASAL SWAB  Final   Special Requests NONE  Final   Culture NEGATIVE  Final   Report Status 01/31/2016 FINAL  Final  Culture, blood (routine x 2)     Status: Abnormal   Collection Time: 01/31/16  6:20 AM  Result Value Ref Range Status   Specimen Description BLOOD LEFT ANTECUBITAL  Final   Special Requests IN PEDIATRIC BOTTLE 3CC  Final   Culture  Setup Time   Final    GRAM POSITIVE COCCI IN CLUSTERS AEROBIC BOTTLE ONLY CRITICAL RESULT CALLED TO, READ BACK BY AND VERIFIED WITH: A MYER 01/31/16 @ 2119 M VESTAL    Culture (A)  Final    STAPHYLOCOCCUS AUREUS SUSCEPTIBILITIES PERFORMED ON PREVIOUS CULTURE WITHIN THE LAST 5 DAYS.    Report Status 02/02/2016 FINAL  Final  Culture, blood (routine x 2)     Status: Abnormal   Collection Time: 01/31/16  6:25 AM  Result Value Ref Range Status   Specimen Description BLOOD LEFT HAND  Final   Special Requests IN PEDIATRIC BOTTLE 1CC  Final   Culture  Setup Time   Final    GRAM POSITIVE COCCI IN CLUSTERS AEROBIC BOTTLE ONLY CRITICAL VALUE NOTED.  VALUE IS CONSISTENT WITH PREVIOUSLY REPORTED AND CALLED VALUE.    Culture (A)  Final    STAPHYLOCOCCUS AUREUS SUSCEPTIBILITIES PERFORMED ON PREVIOUS CULTURE WITHIN THE LAST 5 DAYS.    Report Status 02/02/2016 FINAL  Final  Culture, blood (routine x 2)     Status: None   Collection Time: 02/01/16 10:48 AM  Result Value Ref Range Status   Specimen Description BLOOD RIGHT ARM  Final   Special Requests   Final    BOTTLES DRAWN AEROBIC AND ANAEROBIC 10CC BLUE 5 CC RED   Culture NO GROWTH 5 DAYS  Final   Report Status 02/06/2016 FINAL  Final  Culture, blood (routine x 2)     Status: None    Collection Time: 02/01/16 10:55 AM  Result Value Ref Range Status   Specimen Description BLOOD LEFT HAND  Final   Special Requests IN PEDIATRIC BOTTLE 3CC  Final   Culture NO GROWTH 5 DAYS  Final   Report Status 02/06/2016 FINAL  Final     Labs: Basic Metabolic Panel:  Recent Labs Lab 02/02/16 0935 02/03/16 0318 02/04/16 0544 02/05/16 0532 02/07/16 0344  NA 134* 133* 133* 132* 132*  K 3.3* 3.1* 3.1* 3.4* 3.3*  CL 102 98* 96* 94* 95*  CO2 25 26 30 30 30   GLUCOSE 93 110* 128* 115* 114*  BUN 13 12 11 9 10   CREATININE 0.63 0.65 0.59* 0.57* 0.67  CALCIUM 8.5* 8.9 8.9 9.2 9.4   Liver Function Tests:  Recent Labs Lab 02/01/16 0440  AST 22  ALT 17  ALKPHOS 56  BILITOT 1.5*  PROT 4.9*  ALBUMIN 2.2*   No results for input(s): LIPASE, AMYLASE in the last 168 hours. No results for input(s): AMMONIA in the last 168 hours. CBC:  Recent Labs Lab 02/02/16 0935 02/03/16 0318 02/04/16 0544 02/05/16 0532 02/07/16 0344  WBC 7.2 5.0 8.9 8.2 7.8  HGB 11.7* 11.4* 11.3* 11.4* 11.6*  HCT 35.6* 33.8* 33.5* 34.2* 34.4*  MCV 95.4 93.6 93.1 93.7 92.7  PLT 130* 123* 137* 169 295   Cardiac Enzymes: No results for input(s): CKTOTAL, CKMB, CKMBINDEX, TROPONINI in the last 168 hours. BNP: BNP (last 3 results) No results for input(s): BNP in the last 8760 hours.  ProBNP (last 3 results) No results for input(s): PROBNP in the last 8760 hours.  CBG:  Recent Labs Lab 02/03/16 0746  GLUCAP 94       Signed:  Kelvin Cellar MD.  [facility] 02/07/2016, 1:06 PM

## 2016-02-06 NOTE — Progress Notes (Signed)
[facility] PROGRESS NOTE  Tanner Rice [id number] DOB: 1933-12-17 DOA: 01/28/2016 PCP: Mathews Argyle, MD   82/M with RA on remicaide and MTX admitted with ABd pain, found to have MRSA bacteremia Subsequently found to have Ileus and T8-9 discitis and epidural abscess. Neurosurgery and ID consulted  Assessment/Plan: 1. Sepsis/MRSA bacteremia: Initially presented as abdominal pain and fever and started on treatment for presumed diverticulitis. Patient's blood cultures returned positive 2 for MRSA.  -Continue Iv Vanc Day 8, repeat Blood Cx 8/23 with MRSA again -repeat Blood cultures from 8/24 -NGTD -Appreciate ID consult, had a rash on upper chest recently which resolved, wonder if that caused MRSA seeding -MRI L and T spine with T9 abscess and discitis/OM-plan for 6weeks of IV Vancomycin -TEE negative for endocarditis -PICC line ordered  2. 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 Ileus: -improved, was NPO with NGT -had a BM 8/24 am, NG pulled out early 8/25  -no having BMs and tolerating diet -advanced to soft diet -Ct abd pelvis on admission was normal  2. Hyponatremia:  -stable, now off IVF, improving  3. HTN:  -resumed amlodipine and metoprolol - added PO hydralazine,  hydralazine PRN   4. RA:  -Hold methotrexate -On Remicade  5. Left adrenal mass:  -Adrenal protocol CT as an outpatient once all acute issues resolved  DVt proph: lovenox  Code Status: FULL  Family Communication: d/w wife at bedside everyday till 8/27, left msg for wife 8/28 and 8/29 Disposition Plan:  Home health PT  recommended   Consultants:  ID  Procedures:  TEE - 02/02/16 - PENDING  Antibiotics:  Levaquin - 8/20>>>8/22  Flagyl - 8/20>>8/22  Vancomycin - 8/21 (Started by Pharmacy immediately after + [place] noted) >>  HPI/Subjective: Back pain, + BM yesterday, tolerating soft diet, better overall  Objective: Vitals:   02/06/16 0944 02/06/16 1319  BP: (!) 157/79 (!) 178/94  Pulse:  88  Resp:  18  Temp:  98.1 F (36.7 C)    Intake/Output Summary (Last 24 hours) at 02/06/16 1501 Last data filed at 02/06/16 1354  Gross per 24 hour  Intake              500 ml  Output              400 ml  Net              100 ml   Filed Weights   01/28/16 1710 01/29/16 0145 02/03/16 1424  Weight: 73.5 kg (162 lb) 75.4 kg (166 lb 3.2 oz) 76.3 kg (168 lb 3.2 oz)    Exam:   General:  AAOx2, tired appearing  Cardiovascular: Regular rate and rhythm, 2/6 systolic murmur  Respiratory: Decreased breath sounds in bases with intermittent crackles. Normal effort  Abdomen:  Less distended, but soft, + BS, non tender  Musculoskeletal: Normal tone bilaterally in upper and lower extremities, no bony abnormalities, moves all extremity coordinated fashion.   Ext: no edema  Data Reviewed: Basic Metabolic Panel:  Recent Labs Lab 02/01/16 0440 02/02/16 0935 02/03/16 0318 02/04/16 0544 02/05/16 0532  NA 128* 134* 133* 133* 132*  K 3.7 3.3* 3.1* 3.1* 3.4*  CL 99* 102 98* 96* 94*  CO2 23  25 26 30 30   GLUCOSE 117* 93 110* 128* 115*  BUN 18 13 12 11 9   CREATININE 0.75 0.63 0.65 0.59* 0.57*  CALCIUM 8.3* 8.5* 8.9 8.9 9.2   Liver Function Tests:  Recent Labs Lab 02/01/16 0440  AST 22  ALT 17  ALKPHOS 56  BILITOT 1.5*  PROT 4.9*  ALBUMIN 2.2*   No results for input(s): LIPASE, AMYLASE in the last 168 hours. No results for input(s): AMMONIA in the last 168 hours. CBC:  Recent Labs Lab 02/01/16 0440 02/02/16 0935 02/03/16 0318 02/04/16 0544 02/05/16 0532  WBC 10.4 7.2 5.0 8.9 8.2   HGB 11.0* 11.7* 11.4* 11.3* 11.4*  HCT 32.9* 35.6* 33.8* 33.5* 34.2*  MCV 93.7 95.4 93.6 93.1 93.7  PLT 134* 130* 123* 137* 169   Cardiac Enzymes: No results for input(s): CKTOTAL, CKMB, CKMBINDEX, TROPONINI in the last 168 hours. BNP (last 3 results) No results for input(s): BNP in the last 8760 hours.  ProBNP (last 3 results) No results for input(s): PROBNP in the last 8760 hours.  CBG:  Recent Labs Lab 02/03/16 0746  GLUCAP 94    Recent Results (from the past 240 hour(s))  Culture, blood (routine x 2)     Status: Abnormal   Collection Time: 01/29/16  4:02 AM  Result Value Ref Range Status   Specimen Description BLOOD LEFT ANTECUBITAL  Final   Special Requests BOTTLES DRAWN AEROBIC AND ANAEROBIC 5CC   Final   Culture  Setup Time   Final    GRAM POSITIVE COCCI IN CLUSTERS IN BOTH AEROBIC AND ANAEROBIC BOTTLES Organism ID to follow CRITICAL RESULT CALLED TO, READ BACK BY AND VERIFIED WITH: M BELL 01/29/16 @ [place] (A)  Final   Report Status 01/31/2016 FINAL  Final   Organism ID, Bacteria METHICILLIN RESISTANT STAPHYLOCOCCUS AUREUS  Final      Susceptibility   Methicillin resistant staphylococcus aureus - MIC*    CIPROFLOXACIN >=8 RESISTANT Resistant     ERYTHROMYCIN >=8 RESISTANT Resistant     GENTAMICIN <=0.5 SENSITIVE Sensitive     OXACILLIN >=4 RESISTANT Resistant     TETRACYCLINE <=1 SENSITIVE Sensitive     VANCOMYCIN 1 SENSITIVE Sensitive     TRIMETH/SULFA <=10 SENSITIVE Sensitive     CLINDAMYCIN <=0.25 SENSITIVE Sensitive     RIFAMPIN <=0.5 SENSITIVE Sensitive     Inducible Clindamycin NEGATIVE Sensitive     * METHICILLIN RESISTANT STAPHYLOCOCCUS AUREUS  Blood Culture ID Panel (Reflexed)     Status: Abnormal   Collection Time: 01/29/16  4:02 AM  Result Value Ref Range Status   Enterococcus species NOT DETECTED NOT DETECTED Final   Vancomycin resistance NOT DETECTED NOT DETECTED Final   Listeria  monocytogenes NOT DETECTED NOT DETECTED Final   Staphylococcus species DETECTED (A) NOT DETECTED Final    Comment: CRITICAL RESULT CALLED TO, READ BACK BY AND VERIFIED WITH: M BELL 01/29/16 @ 1929 M VESTAL    Staphylococcus aureus DETECTED (A) NOT DETECTED Final    Comment: CRITICAL RESULT CALLED TO, READ BACK BY AND VERIFIED WITH: M BELL 01/29/16 @ 1929 M VESTAL    Methicillin resistance DETECTED (A) NOT DETECTED Final    Comment: CRITICAL RESULT CALLED TO, READ BACK BY AND VERIFIED WITH: M BELL 01/29/16 @ 1929 M VESTAL    Streptococcus species NOT DETECTED NOT DETECTED Final   Streptococcus agalactiae NOT DETECTED NOT DETECTED Final   Streptococcus pneumoniae NOT DETECTED NOT DETECTED Final  Streptococcus pyogenes NOT DETECTED NOT DETECTED Final   Acinetobacter baumannii NOT DETECTED NOT DETECTED Final   Enterobacteriaceae species NOT DETECTED NOT DETECTED Final   Enterobacter cloacae complex NOT DETECTED NOT DETECTED Final   Escherichia coli NOT DETECTED NOT DETECTED Final   Klebsiella oxytoca NOT DETECTED NOT DETECTED Final   Klebsiella pneumoniae NOT DETECTED NOT DETECTED Final   Proteus species NOT DETECTED NOT DETECTED Final   Serratia marcescens NOT DETECTED NOT DETECTED Final   Carbapenem resistance NOT DETECTED NOT DETECTED Final   Haemophilus influenzae NOT DETECTED NOT DETECTED Final   Neisseria meningitidis NOT DETECTED NOT DETECTED Final   Pseudomonas aeruginosa NOT DETECTED NOT DETECTED Final   Candida albicans NOT DETECTED NOT DETECTED Final   Candida glabrata NOT DETECTED NOT DETECTED Final   Candida krusei NOT DETECTED NOT DETECTED Final   Candida parapsilosis NOT DETECTED NOT DETECTED Final   Candida tropicalis NOT DETECTED NOT DETECTED Final  Culture, blood (routine x 2)     Status: Abnormal   Collection Time: 01/29/16  4:05 AM  Result Value Ref Range Status   Specimen Description BLOOD LEFT ANTECUBITAL  Final   Special Requests BOTTLES DRAWN AEROBIC AND  ANAEROBIC 5CC   Final   Culture  Setup Time   Final    GRAM POSITIVE COCCI IN CLUSTERS IN BOTH AEROBIC AND ANAEROBIC BOTTLES CRITICAL VALUE NOTED.  VALUE IS CONSISTENT WITH PREVIOUSLY REPORTED AND CALLED VALUE.    Culture (A)  Final    STAPHYLOCOCCUS AUREUS SUSCEPTIBILITIES PERFORMED ON PREVIOUS CULTURE WITHIN THE LAST 5 DAYS.    Report Status 01/31/2016 FINAL  Final  MRSA culture     Status: None   Collection Time: 01/30/16  1:16 PM  Result Value Ref Range Status   Specimen Description NASAL SWAB  Final   Special Requests NONE  Final   Culture NEGATIVE  Final   Report Status 01/31/2016 FINAL  Final  Culture, blood (routine x 2)     Status: Abnormal   Collection Time: 01/31/16  6:20 AM  Result Value Ref Range Status   Specimen Description BLOOD LEFT ANTECUBITAL  Final   Special Requests IN PEDIATRIC BOTTLE 3CC  Final   Culture  Setup Time   Final    GRAM POSITIVE COCCI IN CLUSTERS AEROBIC BOTTLE ONLY CRITICAL RESULT CALLED TO, READ BACK BY AND VERIFIED WITH: A MYER 01/31/16 @ 2119 M VESTAL    Culture (A)  Final    STAPHYLOCOCCUS AUREUS SUSCEPTIBILITIES PERFORMED ON PREVIOUS CULTURE WITHIN THE LAST 5 DAYS.    Report Status 02/02/2016 FINAL  Final  Culture, blood (routine x 2)     Status: Abnormal   Collection Time: 01/31/16  6:25 AM  Result Value Ref Range Status   Specimen Description BLOOD LEFT HAND  Final   Special Requests IN PEDIATRIC BOTTLE 1CC  Final   Culture  Setup Time   Final    GRAM POSITIVE COCCI IN CLUSTERS AEROBIC BOTTLE ONLY CRITICAL VALUE NOTED.  VALUE IS CONSISTENT WITH PREVIOUSLY REPORTED AND CALLED VALUE.    Culture (A)  Final    STAPHYLOCOCCUS AUREUS SUSCEPTIBILITIES PERFORMED ON PREVIOUS CULTURE WITHIN THE LAST 5 DAYS.    Report Status 02/02/2016 FINAL  Final  Culture, blood (routine x 2)     Status: None   Collection Time: 02/01/16 10:48 AM  Result Value Ref Range Status   Specimen Description BLOOD RIGHT ARM  Final   Special Requests   Final     BOTTLES DRAWN AEROBIC AND  ANAEROBIC 10CC BLUE 5 CC RED   Culture NO GROWTH 5 DAYS  Final   Report Status 02/06/2016 FINAL  Final  Culture, blood (routine x 2)     Status: None   Collection Time: 02/01/16 10:55 AM  Result Value Ref Range Status   Specimen Description BLOOD LEFT HAND  Final   Special Requests IN PEDIATRIC BOTTLE 3CC  Final   Culture NO GROWTH 5 DAYS  Final   Report Status 02/06/2016 FINAL  Final     Studies: No results found.  Scheduled Meds: . amLODipine  10 mg Oral Daily  . enoxaparin (LOVENOX) injection  40 mg Subcutaneous Q24H  . hydrALAZINE  25 mg Oral Q8H  . lidocaine  1 patch Transdermal Q24H  . metoprolol succinate  50 mg Oral Daily  . naproxen  375 mg Oral BID WC  . polyethylene glycol  17 g Oral BID  . senna-docusate  1 tablet Oral BID  . tamsulosin  0.4 mg Oral QPC breakfast  . vancomycin  1,250 mg Intravenous Q12H   Continuous Infusions:    Principal Problem:   MRSA bacteremia Active Problems:   Rheumatoid arthritis involving multiple sites with positive rheumatoid factor (HCC)   Essential hypertension   Hyponatremia   Left adrenal mass (HCC)   Thoracic discitis   Hyperglycemia   Normocytic anemia   Thrombocytopenia (HCC)   Atherosclerotic peripheral vascular disease (HCC)   Degenerative joint disease of spine   BPH (benign prostatic hyperplasia)   Ileus ([place])    Time spent: 74min    Axiel Fjeld  [phone] [facility]  If 7PM-7AM, please contact night-coverage at [email or web address], password [facility] 02/06/2016, 3:01 PM  LOS: 8 days

## 2016-02-06 NOTE — Progress Notes (Signed)
Physical Therapy Treatment Patient Details Name: Tanner Rice MRN: [id number] DOB: 02-04-1934 Today's Date: 02/06/2016    History of Present Illness Pt. admitted with MRSA, discitis at T9 area that is causing him pain. Pt. has illius that wife states is resolving. PMH: RA, HTN, DDD, adernal mass.     PT Comments    Progress noted with mobility. Pt able to tolerate hallway ambulation today. Pain continues to be his limiting factor.  Follow Up Recommendations  Home health PT     Equipment Recommendations  Rolling walker with 5" wheels    Recommendations for Other Services       Precautions / Restrictions Precautions Precautions: Fall    Mobility  Bed Mobility   Bed Mobility: Supine to Sit     Supine to sit: Min guard;HOB elevated     General bed mobility comments: +rail  Transfers   Equipment used: Rolling walker (2 wheeled)   Sit to Stand: Min guard         General transfer comment: verbal cues for hand placement, increased time required to complete at min guard level  Ambulation/Gait Ambulation/Gait assistance: Min guard Ambulation Distance (Feet): 100 Feet Assistive device: Rolling walker (2 wheeled) Gait Pattern/deviations: Step-through pattern;Trunk flexed;Decreased stride length Gait velocity: decreased Gait velocity interpretation: Below normal speed for age/gender General Gait Details: mildly unsteady gait, shoulders postured in tensed position and unable to correct with verbal/tactile cues   Stairs            Wheelchair Mobility    Modified Rankin (Stroke Patients Only)       Balance   Sitting-balance support: No upper extremity supported;Feet supported Sitting balance-Leahy Scale: Good     Standing balance support: Bilateral upper extremity supported;During functional activity Standing balance-Leahy Scale: Fair                      Cognition Arousal/Alertness: Awake/alert Behavior During Therapy: WFL for tasks  assessed/performed Overall Cognitive Status: Within Functional Limits for tasks assessed                      Exercises      General Comments        Pertinent Vitals/Pain Pain Assessment: 0-10 Pain Score: 9  Pain Location: mid/low back Pain Descriptors / Indicators: Sore Pain Intervention(s): Monitored during session;Premedicated before session;Repositioned    Home Living                      Prior Function            PT Goals (current goals can now be found in the care plan section) Acute Rehab PT Goals Patient Stated Goal: return to independence with mobility PT Goal Formulation: With patient Time For Goal Achievement: 02/19/16 Potential to Achieve Goals: Good Progress towards PT goals: Progressing toward goals    Frequency  Min 3X/week    PT Plan Current plan remains appropriate    Co-evaluation             End of Session Equipment Utilized During Treatment: Gait belt Activity Tolerance: Patient tolerated treatment well Patient left: in chair;with call bell/phone within reach;with chair alarm set     Time: [id number] PT Time Calculation (min) (ACUTE ONLY): 24 min  Charges:  $Gait Training: 23-37 mins                    G Codes:      Lorrin Goodell  Debroah Baller 02/06/2016, 10:03 AM

## 2016-02-06 NOTE — Progress Notes (Signed)
Peripherally Inserted Central Catheter/Midline Placement  The IV Nurse has discussed with the patient and/or persons authorized to consent for the patient, the purpose of this procedure and the potential benefits and risks involved with this procedure.  The benefits include less needle sticks, lab draws from the catheter and patient may be discharged home with the catheter.  Risks include, but not limited to, infection, bleeding, blood clot (thrombus formation), and puncture of an artery; nerve damage and irregular heat beat.  Alternatives to this procedure were also discussed.  PICC/Midline Placement Documentation        Tanner Rice, [name] 02/06/2016, 3:53 PM

## 2016-02-06 NOTE — Progress Notes (Signed)
[facility]  Patient Status:   New pt for [facility] this admission  [facility] is providing the following services: HHRN and [facility] for home IV ABX.  [facility] will follow pt until DC to support transition home and ensure [facility] needs are met.    If patient discharges after hours, please call [phone].   Larry Sierras 02/06/2016, 9:52 PM

## 2016-02-06 NOTE — Care Management Note (Addendum)
Case Management Note  Patient Details  Name: Tanner Rice MRN: [id number] Date of Birth: 27-May-1934  Subjective/Objective:              Admitted with thoracic spine infection/ MRSA bacteremia.      Action/Plan:  Pt will need iv antibiotics until 03/10/2016. Plan is to d/c to home today (8/30) with home health services( RN/ IV infusion and PT).  Expected Discharge Date:   02/07/2016           Expected Discharge Plan:  [place]  In-House Referral:     Discharge planning Services  CM Consult  Post Acute Care Choice:    Choice offered to:  Patient  DME Arranged:  IV pump/equipment DME Agency:  [facility] Arranged:  RN, PT [facility] Agency:  [facility]/ referral made with Butch Penny @ [phone]  Status of Service:  completed  If discussed at Long Length of Stay Meetings, dates discussed:    Additional Comments:  Sharin Mons, RN 02/06/2016, 4:41 PM

## 2016-02-06 NOTE — Progress Notes (Addendum)
Patient ID: Tanner Rice, male   DOB: 1934-04-06, 82 y.o.   MRN: [id number]          [facility]    Date of Admission:  01/28/2016           Day 8 vancomycin  Principal Problem:   MRSA bacteremia Active Problems:   Thoracic discitis   Rheumatoid arthritis involving multiple sites with positive rheumatoid factor (HCC)   Essential hypertension   Hyponatremia   Left adrenal mass (HCC)   Hyperglycemia   Normocytic anemia   Thrombocytopenia (HCC)   Atherosclerotic peripheral vascular disease (HCC)   Degenerative joint disease of spine   BPH (benign prostatic hyperplasia)   Ileus ([place])   . amLODipine  10 mg Oral Daily  . enoxaparin (LOVENOX) injection  40 mg Subcutaneous Q24H  . hydrALAZINE  25 mg Oral Q8H  . lidocaine  1 patch Transdermal Q24H  . metoprolol succinate  50 mg Oral Daily  . naproxen  375 mg Oral BID WC  . polyethylene glycol  17 g Oral BID  . senna-docusate  1 tablet Oral BID  . tamsulosin  0.4 mg Oral QPC breakfast  . vancomycin  1,250 mg Intravenous Q12H    SUBJECTIVE: He tells me that his pain is under better control today. He was able to walk in the hall with physical therapy.  Review of Systems: Review of Systems  Constitutional: Negative for chills, diaphoresis and fever.  Respiratory: Negative for cough, sputum production and shortness of breath.   Cardiovascular: Positive for chest pain.  Gastrointestinal: Negative for abdominal pain, diarrhea, nausea and vomiting.  Neurological: Negative for sensory change and focal weakness.    Past Medical History:  Diagnosis Date  . Hypertension   . Melanoma ([place])   . Rheumatoid arthritis(714.0)     Social History  Substance Use Topics  . Smoking status: Former Research scientist (life sciences)  . Smokeless tobacco: Never Used  . Alcohol use Yes     Comment: glass of wine a day     Family History  Problem Relation Age of Onset  . Prostate cancer Father   . Prostate cancer Brother   .  Osteoarthritis Brother    Allergies  Allergen Reactions  . Other Diarrhea, Nausea And Vomiting and Other (See Comments)    Seafood - muscles   . Penicillins Cross Reactors Hives    OBJECTIVE: Vitals:   02/05/16 2311 02/06/16 0415 02/06/16 0944 02/06/16 1319  BP: (!) 160/79 (!) 149/82 (!) 157/79 (!) 178/94  Pulse: 78 79  88  Resp:  18  18  Temp:  98.1 F (36.7 C)  98.1 F (36.7 C)  TempSrc:  Oral  Oral  SpO2:  95%    Weight:      Height:       Body mass index is 27.15 kg/m.  Physical Exam  Constitutional:  He appears more comfortable today.  Cardiovascular: Normal rate and regular rhythm.   No murmur heard. Pulmonary/Chest: Effort normal and breath sounds normal.  Musculoskeletal: Normal range of motion. He exhibits no edema or tenderness.  Neurological: He is alert.  Skin: No rash noted.  Psychiatric: Mood and affect normal.    Lab Results Lab Results  Component Value Date   WBC 8.2 02/05/2016   HGB 11.4 (L) 02/05/2016   HCT 34.2 (L) 02/05/2016   MCV 93.7 02/05/2016   PLT 169 02/05/2016    Lab Results  Component Value Date   CREATININE 0.57 (L) 02/05/2016  BUN 9 02/05/2016   NA 132 (L) 02/05/2016   K 3.4 (L) 02/05/2016   CL 94 (L) 02/05/2016   CO2 30 02/05/2016    Lab Results  Component Value Date   ALT 17 02/01/2016   AST 22 02/01/2016   ALKPHOS 56 02/01/2016   BILITOT 1.5 (H) 02/01/2016   Sed Rate (mm/hr)  Date Value  02/05/2016 75 (H)   CRP (mg/dL)  Date Value  02/05/2016 13.2 (H)     Microbiology: Recent Results (from the past 240 hour(s))  Culture, blood (routine x 2)     Status: Abnormal   Collection Time: 01/29/16  4:02 AM  Result Value Ref Range Status   Specimen Description BLOOD LEFT ANTECUBITAL  Final   Special Requests BOTTLES DRAWN AEROBIC AND ANAEROBIC 5CC   Final   Culture  Setup Time   Final    GRAM POSITIVE COCCI IN CLUSTERS IN BOTH AEROBIC AND ANAEROBIC BOTTLES Organism ID to follow CRITICAL RESULT CALLED TO, READ  BACK BY AND VERIFIED WITH: M BELL 01/29/16 @ [place] (A)  Final   Report Status 01/31/2016 FINAL  Final   Organism ID, Bacteria METHICILLIN RESISTANT STAPHYLOCOCCUS AUREUS  Final      Susceptibility   Methicillin resistant staphylococcus aureus - MIC*    CIPROFLOXACIN >=8 RESISTANT Resistant     ERYTHROMYCIN >=8 RESISTANT Resistant     GENTAMICIN <=0.5 SENSITIVE Sensitive     OXACILLIN >=4 RESISTANT Resistant     TETRACYCLINE <=1 SENSITIVE Sensitive     VANCOMYCIN 1 SENSITIVE Sensitive     TRIMETH/SULFA <=10 SENSITIVE Sensitive     CLINDAMYCIN <=0.25 SENSITIVE Sensitive     RIFAMPIN <=0.5 SENSITIVE Sensitive     Inducible Clindamycin NEGATIVE Sensitive     * METHICILLIN RESISTANT STAPHYLOCOCCUS AUREUS  Blood Culture ID Panel (Reflexed)     Status: Abnormal   Collection Time: 01/29/16  4:02 AM  Result Value Ref Range Status   Enterococcus species NOT DETECTED NOT DETECTED Final   Vancomycin resistance NOT DETECTED NOT DETECTED Final   Listeria monocytogenes NOT DETECTED NOT DETECTED Final   Staphylococcus species DETECTED (A) NOT DETECTED Final    Comment: CRITICAL RESULT CALLED TO, READ BACK BY AND VERIFIED WITH: M BELL 01/29/16 @ 1929 M VESTAL    Staphylococcus aureus DETECTED (A) NOT DETECTED Final    Comment: CRITICAL RESULT CALLED TO, READ BACK BY AND VERIFIED WITH: M BELL 01/29/16 @ 1929 M VESTAL    Methicillin resistance DETECTED (A) NOT DETECTED Final    Comment: CRITICAL RESULT CALLED TO, READ BACK BY AND VERIFIED WITH: M BELL 01/29/16 @ 1929 M VESTAL    Streptococcus species NOT DETECTED NOT DETECTED Final   Streptococcus agalactiae NOT DETECTED NOT DETECTED Final   Streptococcus pneumoniae NOT DETECTED NOT DETECTED Final   Streptococcus pyogenes NOT DETECTED NOT DETECTED Final   Acinetobacter baumannii NOT DETECTED NOT DETECTED Final   Enterobacteriaceae species NOT DETECTED NOT DETECTED Final   Enterobacter  cloacae complex NOT DETECTED NOT DETECTED Final   Escherichia coli NOT DETECTED NOT DETECTED Final   Klebsiella oxytoca NOT DETECTED NOT DETECTED Final   Klebsiella pneumoniae NOT DETECTED NOT DETECTED Final   Proteus species NOT DETECTED NOT DETECTED Final   Serratia marcescens NOT DETECTED NOT DETECTED Final   Carbapenem resistance NOT DETECTED NOT DETECTED Final   Haemophilus influenzae NOT DETECTED NOT DETECTED Final   Neisseria meningitidis NOT DETECTED NOT DETECTED Final  Pseudomonas aeruginosa NOT DETECTED NOT DETECTED Final   Candida albicans NOT DETECTED NOT DETECTED Final   Candida glabrata NOT DETECTED NOT DETECTED Final   Candida krusei NOT DETECTED NOT DETECTED Final   Candida parapsilosis NOT DETECTED NOT DETECTED Final   Candida tropicalis NOT DETECTED NOT DETECTED Final  Culture, blood (routine x 2)     Status: Abnormal   Collection Time: 01/29/16  4:05 AM  Result Value Ref Range Status   Specimen Description BLOOD LEFT ANTECUBITAL  Final   Special Requests BOTTLES DRAWN AEROBIC AND ANAEROBIC 5CC   Final   Culture  Setup Time   Final    GRAM POSITIVE COCCI IN CLUSTERS IN BOTH AEROBIC AND ANAEROBIC BOTTLES CRITICAL VALUE NOTED.  VALUE IS CONSISTENT WITH PREVIOUSLY REPORTED AND CALLED VALUE.    Culture (A)  Final    STAPHYLOCOCCUS AUREUS SUSCEPTIBILITIES PERFORMED ON PREVIOUS CULTURE WITHIN THE LAST 5 DAYS.    Report Status 01/31/2016 FINAL  Final  MRSA culture     Status: None   Collection Time: 01/30/16  1:16 PM  Result Value Ref Range Status   Specimen Description NASAL SWAB  Final   Special Requests NONE  Final   Culture NEGATIVE  Final   Report Status 01/31/2016 FINAL  Final  Culture, blood (routine x 2)     Status: Abnormal   Collection Time: 01/31/16  6:20 AM  Result Value Ref Range Status   Specimen Description BLOOD LEFT ANTECUBITAL  Final   Special Requests IN PEDIATRIC BOTTLE 3CC  Final   Culture  Setup Time   Final    GRAM POSITIVE COCCI IN  CLUSTERS AEROBIC BOTTLE ONLY CRITICAL RESULT CALLED TO, READ BACK BY AND VERIFIED WITH: A MYER 01/31/16 @ 2119 M VESTAL    Culture (A)  Final    STAPHYLOCOCCUS AUREUS SUSCEPTIBILITIES PERFORMED ON PREVIOUS CULTURE WITHIN THE LAST 5 DAYS.    Report Status 02/02/2016 FINAL  Final  Culture, blood (routine x 2)     Status: Abnormal   Collection Time: 01/31/16  6:25 AM  Result Value Ref Range Status   Specimen Description BLOOD LEFT HAND  Final   Special Requests IN PEDIATRIC BOTTLE 1CC  Final   Culture  Setup Time   Final    GRAM POSITIVE COCCI IN CLUSTERS AEROBIC BOTTLE ONLY CRITICAL VALUE NOTED.  VALUE IS CONSISTENT WITH PREVIOUSLY REPORTED AND CALLED VALUE.    Culture (A)  Final    STAPHYLOCOCCUS AUREUS SUSCEPTIBILITIES PERFORMED ON PREVIOUS CULTURE WITHIN THE LAST 5 DAYS.    Report Status 02/02/2016 FINAL  Final  Culture, blood (routine x 2)     Status: None   Collection Time: 02/01/16 10:48 AM  Result Value Ref Range Status   Specimen Description BLOOD RIGHT ARM  Final   Special Requests   Final    BOTTLES DRAWN AEROBIC AND ANAEROBIC 10CC BLUE 5 CC RED   Culture NO GROWTH 5 DAYS  Final   Report Status 02/06/2016 FINAL  Final  Culture, blood (routine x 2)     Status: None   Collection Time: 02/01/16 10:55 AM  Result Value Ref Range Status   Specimen Description BLOOD LEFT HAND  Final   Special Requests IN PEDIATRIC BOTTLE 3CC  Final   Culture NO GROWTH 5 DAYS  Final   Report Status 02/06/2016 FINAL  Final   Thoracic MRI 02/02/2016  IMPRESSION: Discitis and osteomyelitis at T8-9. Ventral epidural abscess extending to the right of midline causes spinal stenosis and cord  compression. Cord signal normal. Abscess extends anterior to the disc space in the paraspinous soft tissues which are edematous. No other areas of infection in the thoracic or lumbar spine.  Lumbar degenerative changes as above without acute abnormality in the lumbar spine  Moderately large bilateral  pleural effusions.  Critical Value/emergent results were called by telephone at the time of interpretation on 02/02/2016 at 8:16 pm to Walden Field NP , who verbally acknowledged these results.    By: Franchot Gallo M.D.   On: 02/02/2016 20:17  ASSESSMENT: He has MRSA bacteremia complicated by thoracic spine infection. He is improving slowly.  PLAN: 1. PICC placement 2. Continue IV vancomycin For 6 weeks through 03/11/2016 3. I will arrange follow-up in my clinic  Michel Bickers, [place] for [place] [phone] pager   [phone] cell 02/06/2016, 4:04 PM

## 2016-02-06 NOTE — Progress Notes (Addendum)
Pharmacy Antibiotic Note  Tanner Rice is a 82 y.o. male admitted on 01/28/2016 with abdominal pain. Pharmacy consulted for Vancomycin dosing on 8/21 when BCID positive for GPC in clusters;  MRSA bacteremia. MIC to Vanc =1  Day 8 of therapy, plan is minimum 6 weeks per ID recommendation. PICC to be placed. VT 16 but drawn 1 hr early, true VT ~15 based on ke 0.059. Renal function stable.  Plan: -Continue vancomycin to 1250 mg IV q12h -Goal trough 15-20 mcg/ml -Follow renal function, final culture data, studies, and progress -Vanc trough again in a few days to watch for accumulation -Home lisinopril not resumed. Resume? May also help with low K. Saw hydralazine added but could instead increase amlodipine to 10 mg/day  Height: 5\' 6"  (167.6 cm) Weight: 168 lb 3.2 oz (76.3 kg) IBW/kg (Calculated) : 63.8  Temp (24hrs), Avg:97.8 F (36.6 C), Min:97.4 F (36.3 C), Max:98.1 F (36.7 C)   Recent Labs Lab 02/01/16 0440 02/02/16 0935 02/03/16 0318 02/03/16 1019 02/04/16 0544 02/05/16 0532 02/06/16 1027  WBC 10.4 7.2 5.0  --  8.9 8.2  --   CREATININE 0.75 0.63 0.65  --  0.59* 0.57*  --   VANCOTROUGH  --   --   --  14*  --   --  16    Estimated Creatinine Clearance: 66.5 mL/min (by C-G formula based on SCr of 0.8 mg/dL).    Allergies  Allergen Reactions  . Other Diarrhea, Nausea And Vomiting and Other (See Comments)    Seafood - muscles   . Penicillins Cross Reactors Hives    Antimicrobials this admission: 8/21 Vancomycin >> 8/21 Levaquin >> 8/22 8/20 Flagyl >> 8/22 8/20 Cipro x1  Dose adjustments this admission: 8/23 VT 11 on 1g q12h - increased to 1250 mg q12h 8/26 VT 14 on 1250 mg q12h - 1500 mg x1 then resume 1250mg  q12h 8/29 VT 16 (1 hr early), true VT ~15 - cont 1250 mg q12h  Microbiology results: 8/24: ngtd 8/23 BCx: 2o2 + MRSA 8/21 BCx: +BCID MRSA  Renold Genta, PharmD, BCPS Clinical Pharmacist Phone for today - [place] - [phone] 02/06/2016  1:24 PM

## 2016-02-07 DIAGNOSIS — B9562 Methicillin resistant Staphylococcus aureus infection as the cause of diseases classified elsewhere: Secondary | ICD-10-CM | POA: Diagnosis not present

## 2016-02-07 DIAGNOSIS — Z452 Encounter for adjustment and management of vascular access device: Secondary | ICD-10-CM | POA: Diagnosis not present

## 2016-02-07 DIAGNOSIS — I10 Essential (primary) hypertension: Secondary | ICD-10-CM | POA: Diagnosis not present

## 2016-02-07 DIAGNOSIS — M869 Osteomyelitis, unspecified: Secondary | ICD-10-CM | POA: Diagnosis not present

## 2016-02-07 DIAGNOSIS — M069 Rheumatoid arthritis, unspecified: Secondary | ICD-10-CM | POA: Diagnosis not present

## 2016-02-07 DIAGNOSIS — M4644 Discitis, unspecified, thoracic region: Secondary | ICD-10-CM | POA: Diagnosis not present

## 2016-02-07 LAB — CBC
HEMATOCRIT: 34.4 % — AB (ref 39.0–52.0)
Hemoglobin: 11.6 g/dL — ABNORMAL LOW (ref 13.0–17.0)
MCH: 31.3 pg (ref 26.0–34.0)
MCHC: 33.7 g/dL (ref 30.0–36.0)
MCV: 92.7 fL (ref 78.0–100.0)
PLATELETS: 295 10*3/uL (ref 150–400)
RBC: 3.71 MIL/uL — ABNORMAL LOW (ref 4.22–5.81)
RDW: 13.3 % (ref 11.5–15.5)
WBC: 7.8 10*3/uL (ref 4.0–10.5)

## 2016-02-07 LAB — BASIC METABOLIC PANEL
Anion gap: 7 (ref 5–15)
BUN: 10 mg/dL (ref 6–20)
CO2: 30 mmol/L (ref 22–32)
CREATININE: 0.67 mg/dL (ref 0.61–1.24)
Calcium: 9.4 mg/dL (ref 8.9–10.3)
Chloride: 95 mmol/L — ABNORMAL LOW (ref 101–111)
GFR calc Af Amer: >60 mL/min (ref >60)
GFR calc non Af Amer: >60 mL/min (ref >60)
Glucose, Bld: 114 mg/dL — ABNORMAL HIGH (ref 65–99)
POTASSIUM: 3.3 mmol/L — AB (ref 3.5–5.1)
SODIUM: 132 mmol/L — AB (ref 135–145)

## 2016-02-07 MED ORDER — LIDOCAINE 5 % EX PTCH: 1.0000 | MEDICATED_PATCH | CUTANEOUS | 0 refills | Status: DC

## 2016-02-07 MED ORDER — AMLODIPINE BESYLATE 10 MG PO TABS: 10.0000 mg | ORAL_TABLET | Freq: Every day | ORAL | 0 refills | Status: DC

## 2016-02-07 MED ORDER — POTASSIUM CHLORIDE CRYS ER 20 MEQ PO TBCR
20.0000 meq | EXTENDED_RELEASE_TABLET | Freq: Once | ORAL | Status: AC
  Administered 2016-02-07: 20 meq via ORAL
  Filled 2016-02-07: qty 1

## 2016-02-07 MED ORDER — VANCOMYCIN HCL 10 G IV SOLR: 1250.0000 mg | Freq: Two times a day (BID) | INTRAVENOUS | 0 refills | Status: DC

## 2016-02-07 MED ORDER — HEPARIN SOD (PORK) LOCK FLUSH 100 UNIT/ML IV SOLN
250.0000 [IU] | INTRAVENOUS | Status: AC | PRN
  Administered 2016-02-07: 250 [IU]

## 2016-02-07 MED ORDER — METOPROLOL SUCCINATE ER 50 MG PO TB24: 50.0000 mg | ORAL_TABLET | Freq: Every day | ORAL | 1 refills | Status: DC

## 2016-02-07 MED ORDER — LISINOPRIL 10 MG PO TABS
10.0000 mg | ORAL_TABLET | Freq: Every day | ORAL | Status: DC
Start: 2016-02-07 — End: 2016-02-07
  Administered 2016-02-07: 10 mg via ORAL
  Filled 2016-02-07: qty 1

## 2016-02-07 NOTE — Care Management Important Message (Signed)
Important Message  Patient Details  Name: Tanner Rice MRN: [id number] Date of Birth: 23-Feb-1934   Medicare Important Message Given:  N/A - LOS <3 / Initial given by admissions    Sharin Mons, RN 02/07/2016, 11:41 AM

## 2016-02-08 DIAGNOSIS — Z452 Encounter for adjustment and management of vascular access device: Secondary | ICD-10-CM | POA: Diagnosis not present

## 2016-02-08 DIAGNOSIS — M4644 Discitis, unspecified, thoracic region: Secondary | ICD-10-CM | POA: Diagnosis not present

## 2016-02-08 DIAGNOSIS — I10 Essential (primary) hypertension: Secondary | ICD-10-CM | POA: Diagnosis not present

## 2016-02-08 DIAGNOSIS — B9562 Methicillin resistant Staphylococcus aureus infection as the cause of diseases classified elsewhere: Secondary | ICD-10-CM | POA: Diagnosis not present

## 2016-02-08 DIAGNOSIS — M069 Rheumatoid arthritis, unspecified: Secondary | ICD-10-CM | POA: Diagnosis not present

## 2016-02-08 DIAGNOSIS — M869 Osteomyelitis, unspecified: Secondary | ICD-10-CM | POA: Diagnosis not present

## 2016-02-09 DIAGNOSIS — Z452 Encounter for adjustment and management of vascular access device: Secondary | ICD-10-CM | POA: Diagnosis not present

## 2016-02-09 DIAGNOSIS — M4644 Discitis, unspecified, thoracic region: Secondary | ICD-10-CM | POA: Diagnosis not present

## 2016-02-09 DIAGNOSIS — I10 Essential (primary) hypertension: Secondary | ICD-10-CM | POA: Diagnosis not present

## 2016-02-09 DIAGNOSIS — B9562 Methicillin resistant Staphylococcus aureus infection as the cause of diseases classified elsewhere: Secondary | ICD-10-CM | POA: Diagnosis not present

## 2016-02-09 DIAGNOSIS — M869 Osteomyelitis, unspecified: Secondary | ICD-10-CM | POA: Diagnosis not present

## 2016-02-09 DIAGNOSIS — M069 Rheumatoid arthritis, unspecified: Secondary | ICD-10-CM | POA: Diagnosis not present

## 2016-02-10 DIAGNOSIS — M869 Osteomyelitis, unspecified: Secondary | ICD-10-CM | POA: Diagnosis not present

## 2016-02-10 DIAGNOSIS — I10 Essential (primary) hypertension: Secondary | ICD-10-CM | POA: Diagnosis not present

## 2016-02-10 DIAGNOSIS — M4644 Discitis, unspecified, thoracic region: Secondary | ICD-10-CM | POA: Diagnosis not present

## 2016-02-10 DIAGNOSIS — Z452 Encounter for adjustment and management of vascular access device: Secondary | ICD-10-CM | POA: Diagnosis not present

## 2016-02-10 DIAGNOSIS — M069 Rheumatoid arthritis, unspecified: Secondary | ICD-10-CM | POA: Diagnosis not present

## 2016-02-10 DIAGNOSIS — B9562 Methicillin resistant Staphylococcus aureus infection as the cause of diseases classified elsewhere: Secondary | ICD-10-CM | POA: Diagnosis not present

## 2016-02-12 DIAGNOSIS — M069 Rheumatoid arthritis, unspecified: Secondary | ICD-10-CM | POA: Diagnosis not present

## 2016-02-12 DIAGNOSIS — Z452 Encounter for adjustment and management of vascular access device: Secondary | ICD-10-CM | POA: Diagnosis not present

## 2016-02-12 DIAGNOSIS — I10 Essential (primary) hypertension: Secondary | ICD-10-CM | POA: Diagnosis not present

## 2016-02-12 DIAGNOSIS — M869 Osteomyelitis, unspecified: Secondary | ICD-10-CM | POA: Diagnosis not present

## 2016-02-12 DIAGNOSIS — M4644 Discitis, unspecified, thoracic region: Secondary | ICD-10-CM | POA: Diagnosis not present

## 2016-02-12 DIAGNOSIS — B9562 Methicillin resistant Staphylococcus aureus infection as the cause of diseases classified elsewhere: Secondary | ICD-10-CM | POA: Diagnosis not present

## 2016-02-13 ENCOUNTER — Other Ambulatory Visit: Payer: Self-pay | Admitting: Geriatric Medicine

## 2016-02-13 DIAGNOSIS — I10 Essential (primary) hypertension: Secondary | ICD-10-CM | POA: Diagnosis not present

## 2016-02-13 DIAGNOSIS — R5381 Other malaise: Secondary | ICD-10-CM | POA: Diagnosis not present

## 2016-02-13 DIAGNOSIS — M4644 Discitis, unspecified, thoracic region: Secondary | ICD-10-CM | POA: Diagnosis not present

## 2016-02-13 DIAGNOSIS — E279 Disorder of adrenal gland, unspecified: Secondary | ICD-10-CM | POA: Diagnosis not present

## 2016-02-13 DIAGNOSIS — E278 Other specified disorders of adrenal gland: Secondary | ICD-10-CM

## 2016-02-13 DIAGNOSIS — A4102 Sepsis due to Methicillin resistant Staphylococcus aureus: Secondary | ICD-10-CM | POA: Diagnosis not present

## 2016-02-13 DIAGNOSIS — M069 Rheumatoid arthritis, unspecified: Secondary | ICD-10-CM | POA: Diagnosis not present

## 2016-02-15 DIAGNOSIS — M4644 Discitis, unspecified, thoracic region: Secondary | ICD-10-CM | POA: Diagnosis not present

## 2016-02-15 DIAGNOSIS — Z452 Encounter for adjustment and management of vascular access device: Secondary | ICD-10-CM | POA: Diagnosis not present

## 2016-02-15 DIAGNOSIS — I10 Essential (primary) hypertension: Secondary | ICD-10-CM | POA: Diagnosis not present

## 2016-02-15 DIAGNOSIS — B9562 Methicillin resistant Staphylococcus aureus infection as the cause of diseases classified elsewhere: Secondary | ICD-10-CM | POA: Diagnosis not present

## 2016-02-15 DIAGNOSIS — M069 Rheumatoid arthritis, unspecified: Secondary | ICD-10-CM | POA: Diagnosis not present

## 2016-02-15 DIAGNOSIS — M869 Osteomyelitis, unspecified: Secondary | ICD-10-CM | POA: Diagnosis not present

## 2016-02-19 DIAGNOSIS — M4644 Discitis, unspecified, thoracic region: Secondary | ICD-10-CM | POA: Diagnosis not present

## 2016-02-19 DIAGNOSIS — M869 Osteomyelitis, unspecified: Secondary | ICD-10-CM | POA: Diagnosis not present

## 2016-02-19 DIAGNOSIS — Z452 Encounter for adjustment and management of vascular access device: Secondary | ICD-10-CM | POA: Diagnosis not present

## 2016-02-19 DIAGNOSIS — I10 Essential (primary) hypertension: Secondary | ICD-10-CM | POA: Diagnosis not present

## 2016-02-19 DIAGNOSIS — M069 Rheumatoid arthritis, unspecified: Secondary | ICD-10-CM | POA: Diagnosis not present

## 2016-02-19 DIAGNOSIS — B9562 Methicillin resistant Staphylococcus aureus infection as the cause of diseases classified elsewhere: Secondary | ICD-10-CM | POA: Diagnosis not present

## 2016-02-26 ENCOUNTER — Telehealth: Payer: Self-pay | Admitting: Pharmacist

## 2016-02-26 DIAGNOSIS — M4644 Discitis, unspecified, thoracic region: Secondary | ICD-10-CM | POA: Diagnosis not present

## 2016-02-26 DIAGNOSIS — M869 Osteomyelitis, unspecified: Secondary | ICD-10-CM | POA: Diagnosis not present

## 2016-02-26 DIAGNOSIS — B9562 Methicillin resistant Staphylococcus aureus infection as the cause of diseases classified elsewhere: Secondary | ICD-10-CM | POA: Diagnosis not present

## 2016-02-26 DIAGNOSIS — I10 Essential (primary) hypertension: Secondary | ICD-10-CM | POA: Diagnosis not present

## 2016-02-26 DIAGNOSIS — M069 Rheumatoid arthritis, unspecified: Secondary | ICD-10-CM | POA: Diagnosis not present

## 2016-02-26 DIAGNOSIS — Z452 Encounter for adjustment and management of vascular access device: Secondary | ICD-10-CM | POA: Diagnosis not present

## 2016-02-26 NOTE — Telephone Encounter (Signed)
RN called asking if patient's dose of vancomycin should be changed due to vanc trough of 21. Pt is currently on 1250 mg IV q12h, last SCr 0.59 and VT 15 on 9/11. SCr 0.8 today.  Called [facility] pharmacist and advised to reduce dose to 1 gm IV q12h and recheck a BMET and VT before the 4th dose.   Cassie L. Donnajean Lopes, PharmD Infectious [place] for Infectious Disease 02/26/2016, 4:26 PM

## 2016-02-28 DIAGNOSIS — M4644 Discitis, unspecified, thoracic region: Secondary | ICD-10-CM | POA: Diagnosis not present

## 2016-02-28 DIAGNOSIS — Z452 Encounter for adjustment and management of vascular access device: Secondary | ICD-10-CM | POA: Diagnosis not present

## 2016-02-28 DIAGNOSIS — I10 Essential (primary) hypertension: Secondary | ICD-10-CM | POA: Diagnosis not present

## 2016-02-28 DIAGNOSIS — M869 Osteomyelitis, unspecified: Secondary | ICD-10-CM | POA: Diagnosis not present

## 2016-02-28 DIAGNOSIS — B9562 Methicillin resistant Staphylococcus aureus infection as the cause of diseases classified elsewhere: Secondary | ICD-10-CM | POA: Diagnosis not present

## 2016-02-28 DIAGNOSIS — M069 Rheumatoid arthritis, unspecified: Secondary | ICD-10-CM | POA: Diagnosis not present

## 2016-03-04 ENCOUNTER — Ambulatory Visit
Admission: RE | Admit: 2016-03-04 | Discharge: 2016-03-04 | Disposition: A | Discharge status: Home / Self Care | Payer: Medicare Other | Source: Ambulatory Visit | Attending: Geriatric Medicine | Admitting: Geriatric Medicine

## 2016-03-04 DIAGNOSIS — M069 Rheumatoid arthritis, unspecified: Secondary | ICD-10-CM | POA: Diagnosis not present

## 2016-03-04 DIAGNOSIS — M869 Osteomyelitis, unspecified: Secondary | ICD-10-CM | POA: Diagnosis not present

## 2016-03-04 DIAGNOSIS — E279 Disorder of adrenal gland, unspecified: Secondary | ICD-10-CM | POA: Diagnosis not present

## 2016-03-04 DIAGNOSIS — B9562 Methicillin resistant Staphylococcus aureus infection as the cause of diseases classified elsewhere: Secondary | ICD-10-CM | POA: Diagnosis not present

## 2016-03-04 DIAGNOSIS — Z452 Encounter for adjustment and management of vascular access device: Secondary | ICD-10-CM | POA: Diagnosis not present

## 2016-03-04 DIAGNOSIS — E278 Other specified disorders of adrenal gland: Secondary | ICD-10-CM

## 2016-03-04 DIAGNOSIS — M4644 Discitis, unspecified, thoracic region: Secondary | ICD-10-CM | POA: Diagnosis not present

## 2016-03-04 DIAGNOSIS — I10 Essential (primary) hypertension: Secondary | ICD-10-CM | POA: Diagnosis not present

## 2016-03-05 ENCOUNTER — Ambulatory Visit: Payer: BLUE CROSS/BLUE SHIELD | Admitting: Internal Medicine

## 2016-03-07 ENCOUNTER — Encounter: Payer: Self-pay | Admitting: Internal Medicine

## 2016-03-07 ENCOUNTER — Ambulatory Visit (INDEPENDENT_AMBULATORY_CARE_PROVIDER_SITE_OTHER): Payer: Medicare Other | Admitting: Internal Medicine

## 2016-03-07 VITALS — BP 169/82 | HR 72 | Temp 97.7°F | Ht 66.0 in | Wt 154.2 lb

## 2016-03-07 DIAGNOSIS — M4644 Discitis, unspecified, thoracic region: Secondary | ICD-10-CM

## 2016-03-07 DIAGNOSIS — M4640 Discitis, unspecified, site unspecified: Secondary | ICD-10-CM

## 2016-03-07 DIAGNOSIS — Z23 Encounter for immunization: Secondary | ICD-10-CM

## 2016-03-07 MED ORDER — DOXYCYCLINE HYCLATE 100 MG PO TABS: 100.0000 mg | ORAL_TABLET | Freq: Two times a day (BID) | ORAL | 0 refills | Status: DC

## 2016-03-07 MED ORDER — DOXYCYCLINE HYCLATE 100 MG PO TABS: 100.0000 mg | ORAL_TABLET | Freq: Two times a day (BID) | ORAL | 0 refills | Status: AC

## 2016-03-07 MED ORDER — VANCOMYCIN HCL 10 G IV SOLR: 1250.0000 mg | Freq: Two times a day (BID) | INTRAVENOUS | 0 refills | Status: AC

## 2016-03-07 NOTE — Assessment & Plan Note (Signed)
He is improving on therapy for his discitis and osteomyelitis. His inflammatory markers have improved but are still elevated. I talked to him about uncertainty over optimal duration of therapy. We agreed on 7 weeks of IV vancomycin through 03/17/2016 followed by 2 weeks of oral doxycycline. He will follow-up with me in 6 weeks.

## 2016-03-07 NOTE — Progress Notes (Signed)
Verbal order from Dr. Michel Bickers.  Complete IV vancomycin 03/17/16 and discontinue PICC.  Phone call to Amy, [facility] pharmacist to share verbal order.  Pharmacist verbalized back the order.

## 2016-03-07 NOTE — Progress Notes (Signed)
Tanner Rice for Infectious Disease  Patient Active Problem List   Diagnosis Date Noted  . MRSA bacteremia     Priority: High  . Thoracic discitis     Priority: High  . Ileus ([place])   . Hyperglycemia 01/30/2016  . Normocytic anemia 01/30/2016  . Thrombocytopenia ([place]) 01/30/2016  . Atherosclerotic peripheral vascular disease ([place]) 01/30/2016  . Degenerative joint disease of spine 01/30/2016  . BPH (benign prostatic hyperplasia) 01/30/2016  . Rheumatoid arthritis involving multiple sites with positive rheumatoid factor ([place]) 01/29/2016  . Essential hypertension 01/29/2016  . Hyponatremia 01/29/2016  . Left adrenal mass ([place]) 01/29/2016    Patient's Medications  New Prescriptions   DOXYCYCLINE (VIBRA-TABS) 100 MG TABLET    Take 1 tablet (100 mg total) by mouth 2 (two) times daily.  Previous Medications   ACETAMINOPHEN (TYLENOL) 325 MG TABLET    Take 2 tablets (650 mg total) by mouth every 6 (six) hours as needed for mild pain (or Fever >/= 101).   AMLODIPINE (NORVASC) 10 MG TABLET    Take 1 tablet (10 mg total) by mouth daily.   ASPIRIN EC 81 MG TABLET    Take 81 mg by mouth daily.   CALCIUM CITRATE-VITAMIN D (CITRACAL+D) 315-200 MG-UNIT TABLET    Take 1 tablet by mouth 2 (two) times a week.   CYANOCOBALAMIN 500 MCG TABLET    Take 500 mcg by mouth daily.     GLUCOSAMINE-CHONDROITIN 500-400 MG TABLET    Take 1 tablet by mouth daily.   LIDOCAINE (LIDODERM) 5 %    Place 1 patch onto the skin daily. Remove & Discard patch within 12 hours or as directed by MD   LISINOPRIL (PRINIVIL,ZESTRIL) 10 MG TABLET    Take 10 mg by mouth daily.     MELATONIN 3 MG TABS    Take 3 mg by mouth at bedtime.    METOPROLOL SUCCINATE (TOPROL-XL) 50 MG 24 HR TABLET    Take 1 tablet (50 mg total) by mouth daily. Take with or immediately following a meal.   MULTIPLE VITAMIN (MULTIVITAMIN) TABLET    Take 1 tablet by mouth daily.     NAPROXEN (NAPROSYN) 375 MG TABLET    Take 1 tablet (375 mg  total) by mouth 2 (two) times daily with a meal. For 5 days   OXYCODONE (OXY IR/ROXICODONE) 5 MG IMMEDIATE RELEASE TABLET    Take 1 tablet (5 mg total) by mouth every 4 (four) hours as needed for moderate pain.   POLYETHYLENE GLYCOL (MIRALAX / GLYCOLAX) PACKET    Take 17 g by mouth 2 (two) times daily.   SENNA-DOCUSATE (SENOKOT-S) 8.6-50 MG TABLET    Take 1 tablet by mouth 2 (two) times daily.   TAMSULOSIN (FLOMAX) 0.4 MG CAPS CAPSULE    Take 0.4 mg by mouth daily after supper.  Modified Medications   Modified Medication Previous Medication   VANCOMYCIN 1,250 MG IN SODIUM CHLORIDE 0.9 % 250 ML vancomycin 1,250 mg in sodium chloride 0.9 % 250 mL      Inject 1,250 mg into the vein every 12 (twelve) hours. Please give 1,250 mg IV q 12 hours, anticipated stop date 03/13/2016 Qty Sufficient    Inject 1,250 mg into the vein every 12 (twelve) hours. Please give 1,250 mg IV q 12 hours, anticipated stop date 03/13/2016 Qty Sufficient  Discontinued Medications   MINOCYCLINE (MINOCIN,DYNACIN) 100 MG CAPSULE    Take 100 mg by mouth daily as needed (  for flare ups).     Subjective: Tanner Rice man is in with his wife for his hospital follow-up visit. He was hospitalized last month with severe, acute back pain. He was found to have MRSA bacteremia and thoracic discitis and osteomyelitis at the T8-9 level. He had no evidence of endocarditis. He was discharged on IV vancomycin to a skilled nursing facility. He has been back home for about 3 weeks. He is now completed 38 days of IV vancomycin and is feeling much better. He rates his pain as 10+ out of 10 when he went into the hospital. Currently his pain is about 3 out of 10. He has resumed many of his usual activities and is getting out of the house. He does use a walker when he is out of the house he is now only requiring one oxycodone daily to control his pain. He has had no problems tolerating his PICC or vancomycin.  Review of Systems: Review of Systems    Constitutional: Positive for weight loss. Negative for chills, diaphoresis, fever and malaise/fatigue.       He has lost about 10 pounds during this illness. His appetite is now back to normal though.  HENT: Negative for sore throat.   Respiratory: Negative for cough, sputum production and shortness of breath.   Cardiovascular: Negative for chest pain.  Gastrointestinal: Negative for abdominal pain, diarrhea, nausea and vomiting.  Genitourinary: Positive for frequency. Negative for dysuria.       Increased nocturia since he recently stopped taking his Flomax.  Musculoskeletal: Positive for back pain. Negative for joint pain and myalgias.  Skin: Negative for rash.  Neurological: Negative for dizziness, focal weakness and headaches.    Past Medical History:  Diagnosis Date  . Hypertension   . Melanoma ([place])   . Rheumatoid arthritis(714.0)     Social History  Substance Use Topics  . Smoking status: Former Research scientist (life sciences)  . Smokeless tobacco: Never Used  . Alcohol use Yes     Comment: glass of wine a day     Family History  Problem Relation Age of Onset  . Prostate cancer Father   . Prostate cancer Brother   . Osteoarthritis Brother     Allergies  Allergen Reactions  . Other Diarrhea, Nausea And Vomiting and Other (See Comments)    Seafood - muscles   . Penicillins Cross Reactors Hives    Objective: Vitals:   03/07/16 1105  BP: (!) 169/82  Pulse: 72  Temp: 97.7 F (36.5 C)  TempSrc: Oral  Weight: 154 lb 4 oz (70 kg)  Height: 5\' 6"  (1.676 m)   Body mass index is 24.9 kg/m.  Physical Exam  Constitutional: He is oriented to person, place, and time.  He looks much better than when I last saw him. He appears comfortable and in good spirits.  Cardiovascular: Normal rate and regular rhythm.   No murmur heard. Pulmonary/Chest: Effort normal and breath sounds normal. He has no wheezes. He has no rales.  Abdominal: Soft. There is no tenderness.  Neurological: He is alert  and oriented to person, place, and time.  Skin: No rash noted.  Left arm PICC site looks good.  Psychiatric: Mood and affect normal.    Lab Results Sedimentation rate 02/12/2016 110  03/04/2016 48 C-reactive protein 02/12/2016 47.8  03/04/2016 20.8   Problem List Items Addressed This Visit      High   Thoracic discitis    He is improving on therapy for his discitis and  osteomyelitis. His inflammatory markers have improved but are still elevated. I talked to him about uncertainty over optimal duration of therapy. We agreed on 7 weeks of IV vancomycin through 03/17/2016 followed by 2 weeks of oral doxycycline. He will follow-up with me in 6 weeks.      Relevant Medications   doxycycline (VIBRA-TABS) 100 MG tablet (Start on 03/18/2016)    Other Visit Diagnoses   None.      Michel Bickers, MD [facility] for Infect[facility] [place] [facility] [phone] pager   [phone] cell 03/07/2016, 11:42 AM

## 2016-03-11 DIAGNOSIS — B9562 Methicillin resistant Staphylococcus aureus infection as the cause of diseases classified elsewhere: Secondary | ICD-10-CM | POA: Diagnosis not present

## 2016-03-11 DIAGNOSIS — M869 Osteomyelitis, unspecified: Secondary | ICD-10-CM | POA: Diagnosis not present

## 2016-03-11 DIAGNOSIS — M069 Rheumatoid arthritis, unspecified: Secondary | ICD-10-CM | POA: Diagnosis not present

## 2016-03-11 DIAGNOSIS — I10 Essential (primary) hypertension: Secondary | ICD-10-CM | POA: Diagnosis not present

## 2016-03-11 DIAGNOSIS — M4644 Discitis, unspecified, thoracic region: Secondary | ICD-10-CM | POA: Diagnosis not present

## 2016-03-11 DIAGNOSIS — Z452 Encounter for adjustment and management of vascular access device: Secondary | ICD-10-CM | POA: Diagnosis not present

## 2016-03-11 DIAGNOSIS — M899 Disorder of bone, unspecified: Secondary | ICD-10-CM | POA: Diagnosis not present

## 2016-03-18 DIAGNOSIS — M069 Rheumatoid arthritis, unspecified: Secondary | ICD-10-CM | POA: Diagnosis not present

## 2016-03-18 DIAGNOSIS — Z452 Encounter for adjustment and management of vascular access device: Secondary | ICD-10-CM | POA: Diagnosis not present

## 2016-03-18 DIAGNOSIS — I10 Essential (primary) hypertension: Secondary | ICD-10-CM | POA: Diagnosis not present

## 2016-03-18 DIAGNOSIS — B9562 Methicillin resistant Staphylococcus aureus infection as the cause of diseases classified elsewhere: Secondary | ICD-10-CM | POA: Diagnosis not present

## 2016-03-18 DIAGNOSIS — M869 Osteomyelitis, unspecified: Secondary | ICD-10-CM | POA: Diagnosis not present

## 2016-03-18 DIAGNOSIS — M4644 Discitis, unspecified, thoracic region: Secondary | ICD-10-CM | POA: Diagnosis not present

## 2016-03-21 ENCOUNTER — Encounter: Payer: Self-pay | Admitting: Internal Medicine

## 2016-04-03 ENCOUNTER — Encounter: Payer: Self-pay | Admitting: Internal Medicine

## 2016-04-20 ENCOUNTER — Other Ambulatory Visit: Payer: Self-pay | Admitting: Internal Medicine

## 2016-04-20 DIAGNOSIS — M4644 Discitis, unspecified, thoracic region: Secondary | ICD-10-CM

## 2016-04-20 MED ORDER — DOXYCYCLINE HYCLATE 100 MG PO TABS: 100.0000 mg | ORAL_TABLET | Freq: Two times a day (BID) | ORAL | 5 refills | Status: DC

## 2016-04-22 ENCOUNTER — Encounter: Payer: Self-pay | Admitting: Internal Medicine

## 2016-04-22 ENCOUNTER — Ambulatory Visit (INDEPENDENT_AMBULATORY_CARE_PROVIDER_SITE_OTHER): Payer: Medicare Other | Admitting: Internal Medicine

## 2016-04-22 DIAGNOSIS — R7881 Bacteremia: Secondary | ICD-10-CM | POA: Diagnosis not present

## 2016-04-22 LAB — CBC
HEMATOCRIT: 32.1 % — AB (ref 38.5–50.0)
Hemoglobin: 10.6 g/dL — ABNORMAL LOW (ref 13.2–17.1)
MCH: 29 pg (ref 27.0–33.0)
MCHC: 33 g/dL (ref 32.0–36.0)
MCV: 87.7 fL (ref 80.0–100.0)
MPV: 9 fL (ref 7.5–12.5)
PLATELETS: 419 10*3/uL — AB (ref 140–400)
RBC: 3.66 MIL/uL — AB (ref 4.20–5.80)
RDW: 13.9 % (ref 11.0–15.0)
WBC: 7.9 10*3/uL (ref 3.8–10.8)

## 2016-04-22 LAB — BASIC METABOLIC PANEL
BUN: 11 mg/dL (ref 7–25)
CHLORIDE: 95 mmol/L — AB (ref 98–110)
CO2: 30 mmol/L (ref 20–31)
Calcium: 10.7 mg/dL — ABNORMAL HIGH (ref 8.6–10.3)
Creat: 0.71 mg/dL (ref 0.70–1.11)
Glucose, Bld: 154 mg/dL — ABNORMAL HIGH (ref 65–99)
POTASSIUM: 4.2 mmol/L (ref 3.5–5.3)
Sodium: 134 mmol/L — ABNORMAL LOW (ref 135–146)

## 2016-04-22 MED ORDER — VANCOMYCIN HCL IN DEXTROSE 1-5 GM/200ML-% IV SOLN: 1000.0000 mg | INTRAVENOUS | Status: DC

## 2016-04-22 NOTE — Progress Notes (Signed)
Phone call to [place].  Per the patient's insurance, prior authorization is not needed.  [facility] to call pt/wife to schedule MRI.  Phone call to Interventional Radiology.  Message left to return call to [facility] to schedule PICC placement.  Phone number left, [phone].  RN preparing packet to fax to [place] once the PICC appointment is scheduled.  Phone call to [place].  Due to patient previously receiving IV vancomycin in the home with [facility] the patient may receive his first dose in the home.

## 2016-04-22 NOTE — Progress Notes (Signed)
[facility] for Infectious Disease  Patient Active Problem List   Diagnosis Date Noted  . MRSA bacteremia     Priority: High  . Thoracic discitis     Priority: High  . Ileus ([place])   . Hyperglycemia 01/30/2016  . Normocytic anemia 01/30/2016  . Thrombocytopenia ([place]) 01/30/2016  . Atherosclerotic peripheral vascular disease ([place]) 01/30/2016  . Degenerative joint disease of spine 01/30/2016  . BPH (benign prostatic hyperplasia) 01/30/2016  . Rheumatoid arthritis involving multiple sites with positive rheumatoid factor ([place]) 01/29/2016  . Essential hypertension 01/29/2016  . Hyponatremia 01/29/2016  . Left adrenal mass (HCC) 01/29/2016    Patient's Medications  New Prescriptions   VANCOMYCIN (VANCOCIN) 1-5 GM/200ML-% SOLN    Inject 200 mLs (1,000 mg total) into the vein daily.  Previous Medications   ACETAMINOPHEN (TYLENOL) 325 MG TABLET    Take 2 tablets (650 mg total) by mouth every 6 (six) hours as needed for mild pain (or Fever >/= 101).   AMLODIPINE (NORVASC) 10 MG TABLET    Take 1 tablet (10 mg total) by mouth daily.   ASPIRIN EC 81 MG TABLET    Take 81 mg by mouth daily.   CALCIUM CITRATE-VITAMIN D (CITRACAL+D) 315-200 MG-UNIT TABLET    Take 1 tablet by mouth 2 (two) times a week.   CYANOCOBALAMIN 500 MCG TABLET    Take 500 mcg by mouth daily.     GLUCOSAMINE-CHONDROITIN 500-400 MG TABLET    Take 1 tablet by mouth daily.   LIDOCAINE (LIDODERM) 5 %    Place 1 patch onto the skin daily. Remove & Discard patch within 12 hours or as directed by MD   LISINOPRIL (PRINIVIL,ZESTRIL) 10 MG TABLET    Take 10 mg by mouth daily.     MELATONIN 3 MG TABS    Take 3 mg by mouth at bedtime.    METOPROLOL SUCCINATE (TOPROL-XL) 50 MG 24 HR TABLET    Take 1 tablet (50 mg total) by mouth daily. Take with or immediately following a meal.   MULTIPLE VITAMIN (MULTIVITAMIN) TABLET    Take 1 tablet by mouth daily.     NAPROXEN (NAPROSYN) 375 MG TABLET    Take 1 tablet (375 mg total)  by mouth 2 (two) times daily with a meal. For 5 days   OXYCODONE (OXY IR/ROXICODONE) 5 MG IMMEDIATE RELEASE TABLET    Take 1 tablet (5 mg total) by mouth every 4 (four) hours as needed for moderate pain.   POLYETHYLENE GLYCOL (MIRALAX / GLYCOLAX) PACKET    Take 17 g by mouth 2 (two) times daily.   SENNA-DOCUSATE (SENOKOT-S) 8.6-50 MG TABLET    Take 1 tablet by mouth 2 (two) times daily.   TAMSULOSIN (FLOMAX) 0.4 MG CAPS CAPSULE    Take 0.4 mg by mouth daily after supper.  Modified Medications   No medications on file  Discontinued Medications   DOXYCYCLINE (VIBRA-TABS) 100 MG TABLET    Take 1 tablet (100 mg total) by mouth 2 (two) times daily.    Subjective: Tanner Rice is seen on a work in basis. 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 He completed 7 weeks of IV vancomycin on 03/17/2016 and followed that with doxycycline which he took until 03/31/2016. When he completed antibiotics he was having minimal pain and was not requiring pain medications. Over the past week he has had a return of the same severe pain wrapping around his lower chest. He  has not had any problems with bowel or bladder function other than being constipated. He does not have any lower extremity weakness or numbness but states that he does feel somewhat wobbly on his feet. He has had no fever, chills or sweats.  Review of Systems: Review of Systems  Constitutional: Positive for malaise/fatigue. Negative for chills, diaphoresis, fever and weight loss.  HENT: Negative for sore throat.   Respiratory: Negative for cough, sputum production and shortness of breath.   Cardiovascular: Positive for chest pain.  Gastrointestinal: Positive for constipation. Negative for abdominal pain, diarrhea, nausea and vomiting.  Genitourinary: Negative for dysuria and frequency.  Musculoskeletal: Positive for back pain. Negative for joint pain and myalgias.  Skin: Negative for rash.  Neurological: Negative for dizziness,  sensory change, focal weakness and headaches.    Past Medical History:  Diagnosis Date  . Hypertension   . Melanoma ([place])   . Rheumatoid arthritis(714.0)     Social History  Substance Use Topics  . Smoking status: Former Research scientist (life sciences)  . Smokeless tobacco: Never Used  . Alcohol use Yes     Comment: glass of wine a day     Family History  Problem Relation Age of Onset  . Prostate cancer Father   . Prostate cancer Brother   . Osteoarthritis Brother     Allergies  Allergen Reactions  . Other Diarrhea, Nausea And Vomiting and Other (See Comments)    Seafood - muscles   . Penicillins Cross Reactors Hives    Objective: Vitals:   04/22/16 1015  BP: (!) 173/93  Pulse: 76  Temp: 98.1 F (36.7 C)  TempSrc: Oral  Weight: 148 lb 8 oz (67.4 kg)   Body mass index is 23.97 kg/m.  Physical Exam  Constitutional: He is oriented to person, place, and time.  He appears uncomfortable due to pain. He is accompanied by his wife.  Cardiovascular: Normal rate and regular rhythm.   No murmur heard. Pulmonary/Chest: Effort normal and breath sounds normal.  Abdominal: Soft. There is no tenderness.  Musculoskeletal:  Some point tenderness over her mid back bilaterally. No obvious bony deformity.  Neurological: He is alert and oriented to person, place, and time. Gait normal.  Skin: No rash noted.  Psychiatric: Mood and affect normal.    Lab Results    Problem List Items Addressed This Visit      High   MRSA bacteremia    I'm certainly concerned about early relapse of his MRSA vertebral infection. I will check blood work today and order repeat MRI of his thoracic spine and then get him restarted on IV vancomycin as soon as possible. He will follow-up here in 2 weeks.      Relevant Medications   vancomycin (VANCOCIN) 1-5 GM/200ML-% SOLN   Other Relevant Orders   Insert PICC line   MR THORACIC SPINE W WO CONTRAST   C-reactive protein   Sedimentation rate   CBC   Basic metabolic  panel       Michel Bickers, MD [facility] for Infectious [place] [phone] pager   [phone] cell 04/22/2016, 10:40 AM

## 2016-04-22 NOTE — Assessment & Plan Note (Signed)
I'm certainly concerned about early relapse of his MRSA vertebral infection. I will check blood work today and order repeat MRI of his thoracic spine and then get him restarted on IV vancomycin as soon as possible. He will follow-up here in 2 weeks.

## 2016-04-23 ENCOUNTER — Ambulatory Visit: Payer: Medicare Other | Admitting: Internal Medicine

## 2016-04-23 LAB — SEDIMENTATION RATE: Sed Rate: 82 mm/hr — ABNORMAL HIGH (ref 0–20)

## 2016-04-23 LAB — C-REACTIVE PROTEIN: CRP: 88.2 mg/L — ABNORMAL HIGH (ref <8.0)

## 2016-04-23 NOTE — Addendum Note (Signed)
Addended by: Lorne Skeens D on: 04/23/2016 04:45 PM   Modules accepted: Orders

## 2016-04-24 ENCOUNTER — Encounter (HOSPITAL_COMMUNITY): Payer: Self-pay | Admitting: Interventional Radiology

## 2016-04-24 ENCOUNTER — Other Ambulatory Visit: Payer: Self-pay | Admitting: Internal Medicine

## 2016-04-24 ENCOUNTER — Ambulatory Visit (HOSPITAL_COMMUNITY)
Admission: RE | Admit: 2016-04-24 | Discharge: 2016-04-24 | Disposition: A | Discharge status: Home / Self Care | Payer: Medicare Other | Source: Ambulatory Visit | Attending: Interventional Radiology | Admitting: Interventional Radiology

## 2016-04-24 DIAGNOSIS — M4644 Discitis, unspecified, thoracic region: Secondary | ICD-10-CM

## 2016-04-24 DIAGNOSIS — Z792 Long term (current) use of antibiotics: Secondary | ICD-10-CM | POA: Diagnosis not present

## 2016-04-24 DIAGNOSIS — I10 Essential (primary) hypertension: Secondary | ICD-10-CM | POA: Diagnosis not present

## 2016-04-24 DIAGNOSIS — Z452 Encounter for adjustment and management of vascular access device: Secondary | ICD-10-CM | POA: Diagnosis not present

## 2016-04-24 DIAGNOSIS — Z5181 Encounter for therapeutic drug level monitoring: Secondary | ICD-10-CM | POA: Diagnosis not present

## 2016-04-24 DIAGNOSIS — M069 Rheumatoid arthritis, unspecified: Secondary | ICD-10-CM | POA: Diagnosis not present

## 2016-04-24 DIAGNOSIS — B9562 Methicillin resistant Staphylococcus aureus infection as the cause of diseases classified elsewhere: Secondary | ICD-10-CM | POA: Diagnosis not present

## 2016-04-24 HISTORY — PX: IR GENERIC HISTORICAL: IMG1180011

## 2016-04-24 MED ORDER — LIDOCAINE HCL 1 % IJ SOLN
INTRAMUSCULAR | Status: AC
  Filled 2016-04-24: qty 20

## 2016-04-24 MED ORDER — HEPARIN SOD (PORK) LOCK FLUSH 100 UNIT/ML IV SOLN
INTRAVENOUS | Status: AC
  Filled 2016-04-24: qty 5

## 2016-04-24 NOTE — Procedures (Signed)
R arm PowerPICC placed under US and fluoroscopy No ptx on spot chest radiograph. No complication No blood loss. See complete dictation in [facility] PACS.  

## 2016-04-29 DIAGNOSIS — B9562 Methicillin resistant Staphylococcus aureus infection as the cause of diseases classified elsewhere: Secondary | ICD-10-CM | POA: Diagnosis not present

## 2016-04-29 DIAGNOSIS — M069 Rheumatoid arthritis, unspecified: Secondary | ICD-10-CM | POA: Diagnosis not present

## 2016-04-29 DIAGNOSIS — I10 Essential (primary) hypertension: Secondary | ICD-10-CM | POA: Diagnosis not present

## 2016-04-29 DIAGNOSIS — Z452 Encounter for adjustment and management of vascular access device: Secondary | ICD-10-CM | POA: Diagnosis not present

## 2016-04-29 DIAGNOSIS — Z5181 Encounter for therapeutic drug level monitoring: Secondary | ICD-10-CM | POA: Diagnosis not present

## 2016-04-29 DIAGNOSIS — M4644 Discitis, unspecified, thoracic region: Secondary | ICD-10-CM | POA: Diagnosis not present

## 2016-04-29 DIAGNOSIS — R7881 Bacteremia: Secondary | ICD-10-CM | POA: Diagnosis not present

## 2016-04-29 DIAGNOSIS — A4902 Methicillin resistant Staphylococcus aureus infection, unspecified site: Secondary | ICD-10-CM | POA: Diagnosis not present

## 2016-04-30 ENCOUNTER — Ambulatory Visit: Payer: Medicare Other | Admitting: Internal Medicine

## 2016-05-01 ENCOUNTER — Telehealth: Payer: Self-pay | Admitting: *Deleted

## 2016-05-01 ENCOUNTER — Ambulatory Visit
Admission: RE | Admit: 2016-05-01 | Discharge: 2016-05-01 | Disposition: A | Discharge status: Home / Self Care | Payer: Medicare Other | Source: Ambulatory Visit | Attending: Internal Medicine | Admitting: Internal Medicine

## 2016-05-01 DIAGNOSIS — R7881 Bacteremia: Secondary | ICD-10-CM

## 2016-05-01 NOTE — Addendum Note (Signed)
Addended by: Lorne Skeens D on: 05/01/2016 01:39 PM   Modules accepted: Orders

## 2016-05-01 NOTE — Telephone Encounter (Addendum)
RN spoke with Dr. Megan Salon about this patient.  Per Dr. Megan Salon the patient does not need to have the MRI done due to the pain issue.  Left the wife/patient a message.  Continue IV vancomycin until return visit.  Reminded them of f/u appt 05/07/16 @ 1100.

## 2016-05-01 NOTE — Telephone Encounter (Signed)
Wife came to [facility] after patient was unable to complete MRI, due to intense pain.  He had taken a 5 mg oxycodone prior the the exam.  When the patient had his last MRI in the hospital he had received IV pain medication prior to the exam.  Mrs. Madson asked if there was a way for the patient to receive an IV pain medication prior to the MRI if it was scheduled at the hospital.  Dr. Megan Salon please advise if this is possible and how this would be accomplished.

## 2016-05-01 NOTE — Telephone Encounter (Signed)
I feel it is best to hold off on the MRI and just continue vancomycin for now.

## 2016-05-06 ENCOUNTER — Encounter: Payer: Self-pay | Admitting: Internal Medicine

## 2016-05-06 DIAGNOSIS — I10 Essential (primary) hypertension: Secondary | ICD-10-CM | POA: Diagnosis not present

## 2016-05-06 DIAGNOSIS — R7881 Bacteremia: Secondary | ICD-10-CM | POA: Diagnosis not present

## 2016-05-06 DIAGNOSIS — M4644 Discitis, unspecified, thoracic region: Secondary | ICD-10-CM | POA: Diagnosis not present

## 2016-05-06 DIAGNOSIS — B9562 Methicillin resistant Staphylococcus aureus infection as the cause of diseases classified elsewhere: Secondary | ICD-10-CM | POA: Diagnosis not present

## 2016-05-06 DIAGNOSIS — Z452 Encounter for adjustment and management of vascular access device: Secondary | ICD-10-CM | POA: Diagnosis not present

## 2016-05-06 DIAGNOSIS — Z5181 Encounter for therapeutic drug level monitoring: Secondary | ICD-10-CM | POA: Diagnosis not present

## 2016-05-06 DIAGNOSIS — A4902 Methicillin resistant Staphylococcus aureus infection, unspecified site: Secondary | ICD-10-CM | POA: Diagnosis not present

## 2016-05-06 DIAGNOSIS — M069 Rheumatoid arthritis, unspecified: Secondary | ICD-10-CM | POA: Diagnosis not present

## 2016-05-07 ENCOUNTER — Ambulatory Visit (INDEPENDENT_AMBULATORY_CARE_PROVIDER_SITE_OTHER): Payer: Medicare Other | Admitting: Internal Medicine

## 2016-05-07 ENCOUNTER — Encounter: Payer: Self-pay | Admitting: Internal Medicine

## 2016-05-07 DIAGNOSIS — K5903 Drug induced constipation: Secondary | ICD-10-CM | POA: Diagnosis not present

## 2016-05-07 DIAGNOSIS — M4644 Discitis, unspecified, thoracic region: Secondary | ICD-10-CM | POA: Diagnosis present

## 2016-05-07 DIAGNOSIS — E46 Unspecified protein-calorie malnutrition: Secondary | ICD-10-CM | POA: Insufficient documentation

## 2016-05-07 DIAGNOSIS — K5900 Constipation, unspecified: Secondary | ICD-10-CM | POA: Insufficient documentation

## 2016-05-07 DIAGNOSIS — E441 Mild protein-calorie malnutrition: Secondary | ICD-10-CM | POA: Diagnosis not present

## 2016-05-07 MED ORDER — OXYCODONE HCL 5 MG PO TABS: 5.0000 mg | ORAL_TABLET | ORAL | 0 refills | Status: DC | PRN

## 2016-05-07 NOTE — Assessment & Plan Note (Signed)
He has mild protein calorie malnutrition related to his unintentional 20 pound weight loss recently. That has led to some mild dependent edema. That should resolve as his appetite improves and he regains some of that weight.

## 2016-05-07 NOTE — Assessment & Plan Note (Signed)
He is beginning to have some slow improvement in his relapsed MRSA discitis. He will continue IV vancomycin and follow-up here in 4 weeks.

## 2016-05-07 NOTE — Assessment & Plan Note (Signed)
He has opioid-induced constipation. I suggested that he restart Senokot twice daily.

## 2016-05-07 NOTE — Progress Notes (Signed)
[facility] for Infectious Disease  Patient Active Problem List   Diagnosis Date Noted  . MRSA bacteremia     Priority: High  . Thoracic discitis     Priority: High  . Constipation 05/07/2016    Priority: Medium  . Protein-calorie malnutrition ([place]) 05/07/2016    Priority: Medium  . Ileus ([place])   . Hyperglycemia 01/30/2016  . Normocytic anemia 01/30/2016  . Thrombocytopenia ([place]) 01/30/2016  . Atherosclerotic peripheral vascular disease ([place]) 01/30/2016  . Degenerative joint disease of spine 01/30/2016  . BPH (benign prostatic hyperplasia) 01/30/2016  . Rheumatoid arthritis involving multiple sites with positive rheumatoid factor ([place]) 01/29/2016  . Essential hypertension 01/29/2016  . Hyponatremia 01/29/2016  . Left adrenal mass ([place]) 01/29/2016    Patient's Medications  New Prescriptions   No medications on file  Previous Medications   ACETAMINOPHEN (TYLENOL) 325 MG TABLET    Take 2 tablets (650 mg total) by mouth every 6 (six) hours as needed for mild pain (or Fever >/= 101).   AMLODIPINE (NORVASC) 10 MG TABLET    Take 1 tablet (10 mg total) by mouth daily.   ASPIRIN EC 81 MG TABLET    Take 81 mg by mouth daily.   CALCIUM CITRATE-VITAMIN D (CITRACAL+D) 315-200 MG-UNIT TABLET    Take 1 tablet by mouth 2 (two) times a week.   CYANOCOBALAMIN 500 MCG TABLET    Take 500 mcg by mouth daily.     GLUCOSAMINE-CHONDROITIN 500-400 MG TABLET    Take 1 tablet by mouth daily.   LIDOCAINE (LIDODERM) 5 %    Place 1 patch onto the skin daily. Remove & Discard patch within 12 hours or as directed by MD   LISINOPRIL (PRINIVIL,ZESTRIL) 10 MG TABLET    Take 10 mg by mouth daily.     MELATONIN 3 MG TABS    Take 3 mg by mouth at bedtime.    METOPROLOL SUCCINATE (TOPROL-XL) 50 MG 24 HR TABLET    Take 1 tablet (50 mg total) by mouth daily. Take with or immediately following a meal.   MULTIPLE VITAMIN (MULTIVITAMIN) TABLET    Take 1 tablet by mouth daily.     NAPROXEN (NAPROSYN)  375 MG TABLET    Take 1 tablet (375 mg total) by mouth 2 (two) times daily with a meal. For 5 days   POLYETHYLENE GLYCOL (MIRALAX / GLYCOLAX) PACKET    Take 17 g by mouth 2 (two) times daily.   SENNA-DOCUSATE (SENOKOT-S) 8.6-50 MG TABLET    Take 1 tablet by mouth 2 (two) times daily.   TAMSULOSIN (FLOMAX) 0.4 MG CAPS CAPSULE    Take 0.4 mg by mouth daily after supper.   VANCOMYCIN (VANCOCIN) 1-5 GM/200ML-% SOLN    Inject 200 mLs (1,000 mg total) into the vein daily.  Modified Medications   Modified Medication Previous Medication   OXYCODONE (OXY IR/ROXICODONE) 5 MG IMMEDIATE RELEASE TABLET oxyCODONE (OXY IR/ROXICODONE) 5 MG immediate release tablet      Take 1 tablet (5 mg total) by mouth every 4 (four) hours as needed for moderate pain.    Take 1 tablet (5 mg total) by mouth every 4 (four) hours as needed for moderate pain.  Discontinued Medications   No medications on file    Subjective: Tanner Rice is in with his wife for his routine follow-up visit. He recently developed a relapse of his MRSA thoracic discitis. He started back on IV vancomycin at the time of  his last visit and is now completed 13 days of therapy. He has had no problems tolerating his PICC or vancomycin. He was unable to tolerate an MRI scan because of severe pain. Both he and his wife state that he is feeling a little bit better. His pain has been a little bit less recently. He is requiring 3 oxycodone tablets daily to control his pain. He rates his current pain at 6 out of 10. He has noted some swelling in his legs and feet. His appetite is good and he is regained some of the 20 pounds that he had lost in the past few months. He has been bothered by severe constipation. He has not been taking his Senokot.  Review of Systems: Review of Systems  Constitutional: Positive for chills. Negative for diaphoresis, fever, malaise/fatigue and weight loss.  HENT: Negative for sore throat.   Respiratory: Negative for cough, sputum  production and shortness of breath.   Cardiovascular: Negative for chest pain.  Gastrointestinal: Positive for constipation. Negative for abdominal pain, diarrhea, heartburn, nausea and vomiting.  Genitourinary: Negative for dysuria and frequency.  Musculoskeletal: Positive for back pain. Negative for joint pain and myalgias.  Skin: Negative for rash.  Neurological: Negative for dizziness, focal weakness and headaches.    Past Medical History:  Diagnosis Date  . Hypertension   . Melanoma ([place])   . Rheumatoid arthritis(714.0)     Social History  Substance Use Topics  . Smoking status: Former Research scientist (life sciences)  . Smokeless tobacco: Never Used  . Alcohol use Yes     Comment: glass of wine a day     Family History  Problem Relation Age of Onset  . Prostate cancer Father   . Prostate cancer Brother   . Osteoarthritis Brother     Allergies  Allergen Reactions  . Other Diarrhea, Nausea And Vomiting and Other (See Comments)    Seafood - muscles   . Penicillins Cross Reactors Hives    Objective: Vitals:   05/07/16 1100  BP: (!) 177/96  Pulse: 76  Temp: 97.6 F (36.4 C)  TempSrc: Oral  Weight: 152 lb 12 oz (69.3 kg)   Body mass index is 24.65 kg/m.  Physical Exam  Constitutional: He is oriented to person, place, and time.  His weight is up 4 pounds the past 2 weeks. He appears somewhat uncomfortable due to pain.  Eyes: Conjunctivae are normal.  Cardiovascular: Normal rate and regular rhythm.   No murmur heard. Pulmonary/Chest: Effort normal and breath sounds normal. He has no wheezes. He has no rales.  Abdominal: Soft. He exhibits no mass. There is no tenderness.  Musculoskeletal: Normal range of motion. He exhibits edema. He exhibits no tenderness.  1+ pitting edema of his lower legs.  Neurological: He is alert and oriented to person, place, and time.  He walks with the aid of a cane.  Skin: No rash noted.  Right arm PICC site looks okay.  Psychiatric: Mood and affect  normal.    Lab Results Sedimentation rate 04/29/2016: 66 C-reactive protein 04/29/2016: 6.7   Problem List Items Addressed This Visit      High   Thoracic discitis    He is beginning to have some slow improvement in his relapsed MRSA discitis. He will continue IV vancomycin and follow-up here in 4 weeks.      Relevant Medications   oxyCODONE (OXY IR/ROXICODONE) 5 MG immediate release tablet     Medium   Constipation    He has opioid-induced  constipation. I suggested that he restart Senokot twice daily.      Protein-calorie malnutrition ([place])    He has mild protein calorie malnutrition related to his unintentional 20 pound weight loss recently. That has led to some mild dependent edema. That should resolve as his appetite improves and he regains some of that weight.          Michel Bickers, MD [facility] for [facility] [phone] pager   [phone] cell 05/07/2016, 11:37 AM

## 2016-05-08 ENCOUNTER — Telehealth: Payer: Self-pay | Admitting: Pharmacist

## 2016-05-08 NOTE — Telephone Encounter (Signed)
Spoke with [facility] and extended Kamon's antibiotics until 06/18/16 when he comes back to see Dr. Megan Salon. Amy, PharmD, verbalized understanding and will call patient.

## 2016-05-13 ENCOUNTER — Encounter: Payer: Self-pay | Admitting: Internal Medicine

## 2016-05-13 DIAGNOSIS — B9562 Methicillin resistant Staphylococcus aureus infection as the cause of diseases classified elsewhere: Secondary | ICD-10-CM | POA: Diagnosis not present

## 2016-05-13 DIAGNOSIS — M069 Rheumatoid arthritis, unspecified: Secondary | ICD-10-CM | POA: Diagnosis not present

## 2016-05-13 DIAGNOSIS — Z5181 Encounter for therapeutic drug level monitoring: Secondary | ICD-10-CM | POA: Diagnosis not present

## 2016-05-13 DIAGNOSIS — Z452 Encounter for adjustment and management of vascular access device: Secondary | ICD-10-CM | POA: Diagnosis not present

## 2016-05-13 DIAGNOSIS — I10 Essential (primary) hypertension: Secondary | ICD-10-CM | POA: Diagnosis not present

## 2016-05-13 DIAGNOSIS — M4644 Discitis, unspecified, thoracic region: Secondary | ICD-10-CM | POA: Diagnosis not present

## 2016-05-14 ENCOUNTER — Encounter: Payer: Self-pay | Admitting: Internal Medicine

## 2016-05-20 ENCOUNTER — Encounter: Payer: Self-pay | Admitting: Internal Medicine

## 2016-05-20 DIAGNOSIS — R7881 Bacteremia: Secondary | ICD-10-CM | POA: Diagnosis not present

## 2016-05-20 DIAGNOSIS — A4902 Methicillin resistant Staphylococcus aureus infection, unspecified site: Secondary | ICD-10-CM | POA: Diagnosis not present

## 2016-05-20 DIAGNOSIS — M4644 Discitis, unspecified, thoracic region: Secondary | ICD-10-CM | POA: Diagnosis not present

## 2016-05-20 DIAGNOSIS — Z452 Encounter for adjustment and management of vascular access device: Secondary | ICD-10-CM | POA: Diagnosis not present

## 2016-05-20 DIAGNOSIS — B9562 Methicillin resistant Staphylococcus aureus infection as the cause of diseases classified elsewhere: Secondary | ICD-10-CM | POA: Diagnosis not present

## 2016-05-20 DIAGNOSIS — Z5181 Encounter for therapeutic drug level monitoring: Secondary | ICD-10-CM | POA: Diagnosis not present

## 2016-05-20 DIAGNOSIS — I10 Essential (primary) hypertension: Secondary | ICD-10-CM | POA: Diagnosis not present

## 2016-05-20 DIAGNOSIS — M069 Rheumatoid arthritis, unspecified: Secondary | ICD-10-CM | POA: Diagnosis not present

## 2016-05-23 ENCOUNTER — Other Ambulatory Visit: Payer: Self-pay | Admitting: Internal Medicine

## 2016-05-23 MED ORDER — OXYCODONE HCL 5 MG PO TABS: 5.0000 mg | ORAL_TABLET | ORAL | 0 refills | Status: DC | PRN

## 2016-05-27 ENCOUNTER — Encounter: Payer: Self-pay | Admitting: Internal Medicine

## 2016-05-27 DIAGNOSIS — B9562 Methicillin resistant Staphylococcus aureus infection as the cause of diseases classified elsewhere: Secondary | ICD-10-CM | POA: Diagnosis not present

## 2016-05-27 DIAGNOSIS — Z5181 Encounter for therapeutic drug level monitoring: Secondary | ICD-10-CM | POA: Diagnosis not present

## 2016-05-27 DIAGNOSIS — M069 Rheumatoid arthritis, unspecified: Secondary | ICD-10-CM | POA: Diagnosis not present

## 2016-05-27 DIAGNOSIS — M4644 Discitis, unspecified, thoracic region: Secondary | ICD-10-CM | POA: Diagnosis not present

## 2016-05-27 DIAGNOSIS — R7881 Bacteremia: Secondary | ICD-10-CM | POA: Diagnosis not present

## 2016-05-27 DIAGNOSIS — I10 Essential (primary) hypertension: Secondary | ICD-10-CM | POA: Diagnosis not present

## 2016-05-27 DIAGNOSIS — A4902 Methicillin resistant Staphylococcus aureus infection, unspecified site: Secondary | ICD-10-CM | POA: Diagnosis not present

## 2016-05-27 DIAGNOSIS — Z452 Encounter for adjustment and management of vascular access device: Secondary | ICD-10-CM | POA: Diagnosis not present

## 2016-05-27 NOTE — Telephone Encounter (Signed)
RN taking care of the patient called and advised that Due to the holiday the family asked if they could get labs Tuesday 06/04/16 morning. Advised will make note.

## 2016-05-31 ENCOUNTER — Telehealth: Payer: Self-pay | Admitting: *Deleted

## 2016-05-31 NOTE — Telephone Encounter (Signed)
Tanner Rice with Tanner Rice called stating patient would like to continue IV antibiotic until upcoming appt with Dr. Megan Salon on 06/18/16. If the antibiotic needs to stop he would like to maintain the picc line until the appt. He is afraid of having it pulled and then needing it replaced again. Please advise Tanner Rice

## 2016-05-31 NOTE — Telephone Encounter (Signed)
Spoke to [place], pharmacist and an order was previously given to [facility] to extend IV antibiotics through 06/18/16. Called Mary back and she was able to locate this verbal order.  Myrtis Hopping

## 2016-06-04 ENCOUNTER — Encounter: Payer: Self-pay | Admitting: Infectious Diseases

## 2016-06-04 ENCOUNTER — Encounter: Payer: Self-pay | Admitting: Internal Medicine

## 2016-06-04 DIAGNOSIS — R7881 Bacteremia: Secondary | ICD-10-CM | POA: Diagnosis not present

## 2016-06-04 DIAGNOSIS — M546 Pain in thoracic spine: Secondary | ICD-10-CM | POA: Diagnosis not present

## 2016-06-04 DIAGNOSIS — Z5181 Encounter for therapeutic drug level monitoring: Secondary | ICD-10-CM | POA: Diagnosis not present

## 2016-06-04 DIAGNOSIS — Z452 Encounter for adjustment and management of vascular access device: Secondary | ICD-10-CM | POA: Diagnosis not present

## 2016-06-04 DIAGNOSIS — I10 Essential (primary) hypertension: Secondary | ICD-10-CM | POA: Diagnosis not present

## 2016-06-04 DIAGNOSIS — K121 Other forms of stomatitis: Secondary | ICD-10-CM | POA: Diagnosis not present

## 2016-06-04 DIAGNOSIS — A4902 Methicillin resistant Staphylococcus aureus infection, unspecified site: Secondary | ICD-10-CM | POA: Diagnosis not present

## 2016-06-04 DIAGNOSIS — L723 Sebaceous cyst: Secondary | ICD-10-CM | POA: Diagnosis not present

## 2016-06-04 DIAGNOSIS — M4644 Discitis, unspecified, thoracic region: Secondary | ICD-10-CM | POA: Diagnosis not present

## 2016-06-04 DIAGNOSIS — B9562 Methicillin resistant Staphylococcus aureus infection as the cause of diseases classified elsewhere: Secondary | ICD-10-CM | POA: Diagnosis not present

## 2016-06-04 DIAGNOSIS — M069 Rheumatoid arthritis, unspecified: Secondary | ICD-10-CM | POA: Diagnosis not present

## 2016-06-10 DIAGNOSIS — B9562 Methicillin resistant Staphylococcus aureus infection as the cause of diseases classified elsewhere: Secondary | ICD-10-CM | POA: Diagnosis not present

## 2016-06-10 DIAGNOSIS — Z5181 Encounter for therapeutic drug level monitoring: Secondary | ICD-10-CM | POA: Diagnosis not present

## 2016-06-10 DIAGNOSIS — M069 Rheumatoid arthritis, unspecified: Secondary | ICD-10-CM | POA: Diagnosis not present

## 2016-06-10 DIAGNOSIS — M4644 Discitis, unspecified, thoracic region: Secondary | ICD-10-CM | POA: Diagnosis not present

## 2016-06-10 DIAGNOSIS — I10 Essential (primary) hypertension: Secondary | ICD-10-CM | POA: Diagnosis not present

## 2016-06-10 DIAGNOSIS — Z452 Encounter for adjustment and management of vascular access device: Secondary | ICD-10-CM | POA: Diagnosis not present

## 2016-06-13 DIAGNOSIS — Z5181 Encounter for therapeutic drug level monitoring: Secondary | ICD-10-CM | POA: Diagnosis not present

## 2016-06-13 DIAGNOSIS — B9562 Methicillin resistant Staphylococcus aureus infection as the cause of diseases classified elsewhere: Secondary | ICD-10-CM | POA: Diagnosis not present

## 2016-06-13 DIAGNOSIS — M4644 Discitis, unspecified, thoracic region: Secondary | ICD-10-CM | POA: Diagnosis not present

## 2016-06-13 DIAGNOSIS — I10 Essential (primary) hypertension: Secondary | ICD-10-CM | POA: Diagnosis not present

## 2016-06-13 DIAGNOSIS — M069 Rheumatoid arthritis, unspecified: Secondary | ICD-10-CM | POA: Diagnosis not present

## 2016-06-13 DIAGNOSIS — Z452 Encounter for adjustment and management of vascular access device: Secondary | ICD-10-CM | POA: Diagnosis not present

## 2016-06-17 DIAGNOSIS — B9562 Methicillin resistant Staphylococcus aureus infection as the cause of diseases classified elsewhere: Secondary | ICD-10-CM | POA: Diagnosis not present

## 2016-06-17 DIAGNOSIS — M4644 Discitis, unspecified, thoracic region: Secondary | ICD-10-CM | POA: Diagnosis not present

## 2016-06-17 DIAGNOSIS — Z452 Encounter for adjustment and management of vascular access device: Secondary | ICD-10-CM | POA: Diagnosis not present

## 2016-06-17 DIAGNOSIS — I10 Essential (primary) hypertension: Secondary | ICD-10-CM | POA: Diagnosis not present

## 2016-06-17 DIAGNOSIS — M069 Rheumatoid arthritis, unspecified: Secondary | ICD-10-CM | POA: Diagnosis not present

## 2016-06-17 DIAGNOSIS — Z5181 Encounter for therapeutic drug level monitoring: Secondary | ICD-10-CM | POA: Diagnosis not present

## 2016-06-18 ENCOUNTER — Ambulatory Visit (INDEPENDENT_AMBULATORY_CARE_PROVIDER_SITE_OTHER): Payer: Medicare Other | Admitting: Internal Medicine

## 2016-06-18 ENCOUNTER — Encounter: Payer: Self-pay | Admitting: Internal Medicine

## 2016-06-18 DIAGNOSIS — M4644 Discitis, unspecified, thoracic region: Secondary | ICD-10-CM | POA: Diagnosis present

## 2016-06-18 DIAGNOSIS — Z452 Encounter for adjustment and management of vascular access device: Secondary | ICD-10-CM | POA: Diagnosis not present

## 2016-06-18 DIAGNOSIS — M069 Rheumatoid arthritis, unspecified: Secondary | ICD-10-CM | POA: Diagnosis not present

## 2016-06-18 DIAGNOSIS — B9562 Methicillin resistant Staphylococcus aureus infection as the cause of diseases classified elsewhere: Secondary | ICD-10-CM | POA: Diagnosis not present

## 2016-06-18 DIAGNOSIS — I10 Essential (primary) hypertension: Secondary | ICD-10-CM | POA: Diagnosis not present

## 2016-06-18 DIAGNOSIS — Z5181 Encounter for therapeutic drug level monitoring: Secondary | ICD-10-CM | POA: Diagnosis not present

## 2016-06-18 MED ORDER — DOXYCYCLINE HYCLATE 100 MG PO TABS: 100.0000 mg | ORAL_TABLET | Freq: Two times a day (BID) | ORAL | 11 refills | Status: DC

## 2016-06-18 NOTE — Progress Notes (Signed)
[facility] for Infectious Disease  Patient Active Problem List   Diagnosis Date Noted  . MRSA bacteremia     Priority: High  . Thoracic discitis     Priority: High  . Constipation 05/07/2016    Priority: Medium  . Protein-calorie malnutrition ([place]) 05/07/2016    Priority: Medium  . Ileus ([place])   . Hyperglycemia 01/30/2016  . Normocytic anemia 01/30/2016  . Thrombocytopenia ([place]) 01/30/2016  . Atherosclerotic peripheral vascular disease ([place]) 01/30/2016  . Degenerative joint disease of spine 01/30/2016  . BPH (benign prostatic hyperplasia) 01/30/2016  . Rheumatoid arthritis involving multiple sites with positive rheumatoid factor ([place]) 01/29/2016  . Essential hypertension 01/29/2016  . Hyponatremia 01/29/2016  . Left adrenal mass ([place]) 01/29/2016    Patient's Medications  New Prescriptions   DOXYCYCLINE (VIBRA-TABS) 100 MG TABLET    Take 1 tablet (100 mg total) by mouth 2 (two) times daily.  Previous Medications   ACETAMINOPHEN (TYLENOL) 325 MG TABLET    Take 2 tablets (650 mg total) by mouth every 6 (six) hours as needed for mild pain (or Fever >/= 101).   AMLODIPINE (NORVASC) 10 MG TABLET    Take 1 tablet (10 mg total) by mouth daily.   ASPIRIN EC 81 MG TABLET    Take 81 mg by mouth daily.   CALCIUM CITRATE-VITAMIN D (CITRACAL+D) 315-200 MG-UNIT TABLET    Take 1 tablet by mouth 2 (two) times a week.   CYANOCOBALAMIN 500 MCG TABLET    Take 500 mcg by mouth daily.     GLUCOSAMINE-CHONDROITIN 500-400 MG TABLET    Take 1 tablet by mouth daily.   LIDOCAINE (LIDODERM) 5 %    Place 1 patch onto the skin daily. Remove & Discard patch within 12 hours or as directed by MD   LISINOPRIL (PRINIVIL,ZESTRIL) 10 MG TABLET    Take 10 mg by mouth daily.     MELATONIN 3 MG TABS    Take 3 mg by mouth at bedtime.    METOPROLOL SUCCINATE (TOPROL-XL) 50 MG 24 HR TABLET    Take 1 tablet (50 mg total) by mouth daily. Take with or immediately following a meal.   MULTIPLE VITAMIN  (MULTIVITAMIN) TABLET    Take 1 tablet by mouth daily.     NAPROXEN (NAPROSYN) 375 MG TABLET    Take 1 tablet (375 mg total) by mouth 2 (two) times daily with a meal. For 5 days   OXYCODONE (OXY IR/ROXICODONE) 5 MG IMMEDIATE RELEASE TABLET    Take 1 tablet (5 mg total) by mouth every 4 (four) hours as needed for moderate pain.   POLYETHYLENE GLYCOL (MIRALAX / GLYCOLAX) PACKET    Take 17 g by mouth 2 (two) times daily.   SENNA-DOCUSATE (SENOKOT-S) 8.6-50 MG TABLET    Take 1 tablet by mouth 2 (two) times daily.   TAMSULOSIN (FLOMAX) 0.4 MG CAPS CAPSULE    Take 0.4 mg by mouth daily after supper.  Modified Medications   No medications on file  Discontinued Medications   VANCOMYCIN (VANCOCIN) 1-5 GM/200ML-% SOLN    Inject 200 mLs (1,000 mg total) into the vein daily.    Subjective: Tanner Rice is in with his wife for his routine follow-up visit. He has now completed 8 weeks of IV vancomycin for his relapsed MRSA thoracic discitis. He has had no problems tolerating his PICC or vancomycin. His pain is dramatically better. He will occasionally need to take a naproxen for pain  but is not taking any more oxycodone. He still uses a cane to ambulate because he feels a little out of balance but this is improving. He has noted a bony protrusion of his mid spine recently.  Review of Systems: Review of Systems  Constitutional: Negative for chills, diaphoresis and fever.       He is gaining some of his lost weight.  Gastrointestinal: Negative for abdominal pain, diarrhea, nausea and vomiting.  Musculoskeletal: Positive for back pain.    Past Medical History:  Diagnosis Date  . Hypertension   . Melanoma ([place])   . Rheumatoid arthritis(714.0)     Social History  Substance Use Topics  . Smoking status: Former Research scientist (life sciences)  . Smokeless tobacco: Never Used  . Alcohol use Yes     Comment: glass of wine a day     Family History  Problem Relation Age of Onset  . Prostate cancer Father   . Prostate cancer  Brother   . Osteoarthritis Brother     Allergies  Allergen Reactions  . Other Diarrhea, Nausea And Vomiting and Other (See Comments)    Seafood - muscles   . Penicillins Cross Reactors Hives    Objective: Vitals:   06/18/16 1032  BP: (!) 164/70  Pulse: 60  Temp: 97.6 F (36.4 C)  TempSrc: Oral  Weight: 155 lb (70.3 kg)  Height: 5\' 6"  (1.676 m)   Body mass index is 25.02 kg/m.  Physical Exam  Constitutional: He is oriented to person, place, and time.  He appears much more comfortable and in better spirits than on his last visit.  Cardiovascular: Normal rate and regular rhythm.   No murmur heard. Pulmonary/Chest: Effort normal and breath sounds normal.  Abdominal: Soft. There is no tenderness.  Musculoskeletal:  He does have some bony protrusion and slight kyphosis of the lower thoracic spine.  Neurological: He is alert and oriented to person, place, and time.  Skin: No rash noted.  There is some dried blood under his PICC dressing.  Psychiatric: Mood and affect normal.    Lab Results His sedimentation rate has normalized to 18 and his C-reactive protein has normalized to 0.5.   Problem List Items Addressed This Visit      High   Thoracic discitis    He has had a very good response to a second round of IV vancomycin for MRSA thoracic discitis. I discussed options with him and we have agreed to stop vancomycin and start suppressive doxycycline. I have warned him about potential photosensitivity. He will follow-up in 2 months.      Relevant Medications   doxycycline (VIBRA-TABS) 100 MG tablet       Michel Bickers, MD [facility] for Infectious [place] [phone] pager   [phone] cell 06/18/2016, 10:56 AM

## 2016-06-18 NOTE — Assessment & Plan Note (Signed)
He has had a very good response to a second round of IV vancomycin for MRSA thoracic discitis. I discussed options with him and we have agreed to stop vancomycin and start suppressive doxycycline. I have warned him about potential photosensitivity. He will follow-up in 2 months.

## 2016-08-06 ENCOUNTER — Encounter: Payer: Self-pay | Admitting: Internal Medicine

## 2016-08-06 ENCOUNTER — Ambulatory Visit (INDEPENDENT_AMBULATORY_CARE_PROVIDER_SITE_OTHER): Payer: Medicare Other | Admitting: Internal Medicine

## 2016-08-06 DIAGNOSIS — M4644 Discitis, unspecified, thoracic region: Secondary | ICD-10-CM

## 2016-08-06 MED ORDER — DOXYCYCLINE HYCLATE 100 MG PO TABS: 100.0000 mg | ORAL_TABLET | Freq: Two times a day (BID) | ORAL | 11 refills | Status: DC

## 2016-08-06 NOTE — Progress Notes (Signed)
[place] for Infectious Disease  Patient Active Problem List   Diagnosis Date Noted  . MRSA bacteremia     Priority: High  . Thoracic discitis     Priority: High  . Constipation 05/07/2016    Priority: Medium  . Protein-calorie malnutrition ([place]) 05/07/2016    Priority: Medium  . Ileus ([place])   . Hyperglycemia 01/30/2016  . Normocytic anemia 01/30/2016  . Thrombocytopenia ([place]) 01/30/2016  . Atherosclerotic peripheral vascular disease ([place]) 01/30/2016  . Degenerative joint disease of spine 01/30/2016  . BPH (benign prostatic hyperplasia) 01/30/2016  . Rheumatoid arthritis involving multiple sites with positive rheumatoid factor ([place]) 01/29/2016  . Essential hypertension 01/29/2016  . Hyponatremia 01/29/2016  . Left adrenal mass ([place]) 01/29/2016    Patient's Medications  New Prescriptions   No medications on file  Previous Medications   ACETAMINOPHEN (TYLENOL) 325 MG TABLET    Take 2 tablets (650 mg total) by mouth every 6 (six) hours as needed for mild pain (or Fever >/= 101).   AMLODIPINE (NORVASC) 10 MG TABLET    Take 1 tablet (10 mg total) by mouth daily.   ASPIRIN EC 81 MG TABLET    Take 81 mg by mouth daily.   CALCIUM CITRATE-VITAMIN D (CITRACAL+D) 315-200 MG-UNIT TABLET    Take 1 tablet by mouth 2 (two) times a week.   CYANOCOBALAMIN 500 MCG TABLET    Take 500 mcg by mouth daily.     GLUCOSAMINE-CHONDROITIN 500-400 MG TABLET    Take 1 tablet by mouth daily.   LIDOCAINE (LIDODERM) 5 %    Place 1 patch onto the skin daily. Remove & Discard patch within 12 hours or as directed by MD   LISINOPRIL (PRINIVIL,ZESTRIL) 10 MG TABLET    Take 10 mg by mouth daily.     MELATONIN 3 MG TABS    Take 3 mg by mouth at bedtime.    METOPROLOL SUCCINATE (TOPROL-XL) 50 MG 24 HR TABLET    Take 1 tablet (50 mg total) by mouth daily. Take with or immediately following a meal.   MULTIPLE VITAMIN (MULTIVITAMIN) TABLET    Take 1 tablet by mouth daily.     NAPROXEN (NAPROSYN)  375 MG TABLET    Take 1 tablet (375 mg total) by mouth 2 (two) times daily with a meal. For 5 days   POLYETHYLENE GLYCOL (MIRALAX / GLYCOLAX) PACKET    Take 17 g by mouth 2 (two) times daily.   SENNA-DOCUSATE (SENOKOT-S) 8.6-50 MG TABLET    Take 1 tablet by mouth 2 (two) times daily.   TAMSULOSIN (FLOMAX) 0.4 MG CAPS CAPSULE    Take 0.4 mg by mouth daily after supper.  Modified Medications   Modified Medication Previous Medication   DOXYCYCLINE (VIBRA-TABS) 100 MG TABLET doxycycline (VIBRA-TABS) 100 MG tablet      Take 1 tablet (100 mg total) by mouth 2 (two) times daily.    Take 1 tablet (100 mg total) by mouth 2 (two) times daily.  Discontinued Medications   OXYCODONE (OXY IR/ROXICODONE) 5 MG IMMEDIATE RELEASE TABLET    Take 1 tablet (5 mg total) by mouth every 4 (four) hours as needed for moderate pain.    Subjective: Tanner Rice is in for his routine follow-up visit. He continues to take his doxycycline without difficulty and has chronic suppressive therapy for recurrent MRSA thoracic discitis. He now only has transient, mild right flank pain when he rolls over in bed. Most nights  he does not take anything for it. Occasionally he will take Aleve. He has not required any oxycodone in about 3 weeks. Overall he feels like he is improving but he still notes a variety of new problems that did not exist before he developed infection last year.  His appetite has improved but he has still not regained all of his lost weight. He is having problems with constipation. He is having difficulty sleeping and has been taking melatonin. He notices some problems with his memory. He still feels slightly out of balance but is requiring the aid of a cane much less than he was several months ago. He has noted some intermittent swelling in his legs. He has not had any flare of his rheumatoid arthritis. He has been off of his Remicade since he first developed infection last year.  Review of Systems: Review of Systems    Constitutional: Positive for weight loss. Negative for chills, diaphoresis, fever and malaise/fatigue.  HENT: Negative for sore throat.   Respiratory: Negative for cough, sputum production and shortness of breath.   Cardiovascular: Negative for chest pain.  Gastrointestinal: Positive for constipation. Negative for abdominal pain, diarrhea, heartburn, nausea and vomiting.  Genitourinary: Negative for dysuria and frequency.  Musculoskeletal: Positive for back pain. Negative for joint pain and myalgias.  Skin: Negative for rash.  Neurological: Positive for weakness. Negative for dizziness and headaches.       As noted in history of present illness.  Psychiatric/Behavioral: Negative for depression and substance abuse. The patient is not nervous/anxious.        He notes he is more sensitive to the effects of alcohol. He never has more than one drink each night.    Past Medical History:  Diagnosis Date  . Hypertension   . Melanoma ([place])   . Rheumatoid arthritis(714.0)     Social History  Substance Use Topics  . Smoking status: Former Research scientist (life sciences)  . Smokeless tobacco: Never Used  . Alcohol use Yes     Comment: glass of wine a day     Family History  Problem Relation Age of Onset  . Prostate cancer Father   . Prostate cancer Brother   . Osteoarthritis Brother     Allergies  Allergen Reactions  . Other Diarrhea, Nausea And Vomiting and Other (See Comments)    Seafood - muscles   . Penicillins Cross Reactors Hives    Objective: Vitals:   08/06/16 1028  BP: (!) 163/79  Pulse: 66  Weight: 151 lb 4 oz (68.6 kg)   Body mass index is 24.41 kg/m.  Physical Exam  Constitutional: He is oriented to person, place, and time.  He is talkative and in good spirits. His weight is up about 3 pounds over the past 3 months.  Cardiovascular: Normal rate and regular rhythm.   No murmur heard. Pulmonary/Chest: Effort normal and breath sounds normal. He has no wheezes. He has no rales.   Abdominal: Soft. There is no tenderness.  Musculoskeletal: Normal range of motion. He exhibits no edema or tenderness.  Neurological: He is alert and oriented to person, place, and time. Gait normal.  Skin: No rash noted.  Psychiatric: Mood and affect normal.    Lab Results    Problem List Items Addressed This Visit      High   Thoracic discitis    His recurrent thoracic discitis has responded well to a second course of IV vancomycin and chronic suppressive doxycycline. He is tolerating doxycycline well and I suggest  that he continues this indefinitely. He will follow-up here in 6 months. I expect that he will continue to see some improvement in his overall condition over the next 3-6 months.      Relevant Medications   doxycycline (VIBRA-TABS) 100 MG tablet       Tanner Bickers, MD [facility] for [facility] [place] [phone] pager   [phone] cell 08/06/2016, 11:14 AM

## 2016-08-06 NOTE — Assessment & Plan Note (Signed)
His recurrent thoracic discitis has responded well to a second course of IV vancomycin and chronic suppressive doxycycline. He is tolerating doxycycline well and I suggest that he continues this indefinitely. He will follow-up here in 6 months. I expect that he will continue to see some improvement in his overall condition over the next 3-6 months.

## 2016-08-28 DIAGNOSIS — L821 Other seborrheic keratosis: Secondary | ICD-10-CM | POA: Diagnosis not present

## 2016-08-28 DIAGNOSIS — D1801 Hemangioma of skin and subcutaneous tissue: Secondary | ICD-10-CM | POA: Diagnosis not present

## 2016-08-28 DIAGNOSIS — L723 Sebaceous cyst: Secondary | ICD-10-CM | POA: Diagnosis not present

## 2016-08-28 DIAGNOSIS — L814 Other melanin hyperpigmentation: Secondary | ICD-10-CM | POA: Diagnosis not present

## 2016-08-28 DIAGNOSIS — Z85820 Personal history of malignant melanoma of skin: Secondary | ICD-10-CM | POA: Diagnosis not present

## 2016-08-28 DIAGNOSIS — L718 Other rosacea: Secondary | ICD-10-CM | POA: Diagnosis not present

## 2016-08-28 DIAGNOSIS — L300 Nummular dermatitis: Secondary | ICD-10-CM | POA: Diagnosis not present

## 2016-08-28 DIAGNOSIS — L218 Other seborrheic dermatitis: Secondary | ICD-10-CM | POA: Diagnosis not present

## 2016-09-04 ENCOUNTER — Other Ambulatory Visit: Payer: Self-pay | Admitting: Geriatric Medicine

## 2016-09-04 DIAGNOSIS — E278 Other specified disorders of adrenal gland: Secondary | ICD-10-CM

## 2016-09-04 DIAGNOSIS — E279 Disorder of adrenal gland, unspecified: Principal | ICD-10-CM

## 2016-09-10 ENCOUNTER — Ambulatory Visit
Admission: RE | Admit: 2016-09-10 | Discharge: 2016-09-10 | Disposition: A | Discharge status: Home / Self Care | Payer: Medicare Other | Source: Ambulatory Visit | Attending: Geriatric Medicine | Admitting: Geriatric Medicine

## 2016-09-10 DIAGNOSIS — E279 Disorder of adrenal gland, unspecified: Principal | ICD-10-CM

## 2016-09-10 DIAGNOSIS — E278 Other specified disorders of adrenal gland: Secondary | ICD-10-CM

## 2016-09-10 DIAGNOSIS — N281 Cyst of kidney, acquired: Secondary | ICD-10-CM | POA: Diagnosis not present

## 2016-10-24 ENCOUNTER — Encounter (HOSPITAL_BASED_OUTPATIENT_CLINIC_OR_DEPARTMENT_OTHER): Payer: Self-pay | Admitting: Emergency Medicine

## 2016-10-24 ENCOUNTER — Emergency Department (HOSPITAL_BASED_OUTPATIENT_CLINIC_OR_DEPARTMENT_OTHER)
Admission: EM | Admit: 2016-10-24 | Discharge: 2016-10-24 | Disposition: A | Discharge status: Home / Self Care | Payer: Medicare Other | Attending: Emergency Medicine | Admitting: Emergency Medicine

## 2016-10-24 ENCOUNTER — Emergency Department (HOSPITAL_BASED_OUTPATIENT_CLINIC_OR_DEPARTMENT_OTHER): Payer: Medicare Other

## 2016-10-24 DIAGNOSIS — Z87891 Personal history of nicotine dependence: Secondary | ICD-10-CM | POA: Diagnosis not present

## 2016-10-24 DIAGNOSIS — I4510 Unspecified right bundle-branch block: Secondary | ICD-10-CM | POA: Diagnosis not present

## 2016-10-24 DIAGNOSIS — R1013 Epigastric pain: Secondary | ICD-10-CM | POA: Insufficient documentation

## 2016-10-24 DIAGNOSIS — H52203 Unspecified astigmatism, bilateral: Secondary | ICD-10-CM | POA: Diagnosis not present

## 2016-10-24 DIAGNOSIS — I10 Essential (primary) hypertension: Secondary | ICD-10-CM | POA: Diagnosis not present

## 2016-10-24 DIAGNOSIS — Z7982 Long term (current) use of aspirin: Secondary | ICD-10-CM | POA: Insufficient documentation

## 2016-10-24 DIAGNOSIS — Z85828 Personal history of other malignant neoplasm of skin: Secondary | ICD-10-CM | POA: Diagnosis not present

## 2016-10-24 DIAGNOSIS — R079 Chest pain, unspecified: Secondary | ICD-10-CM | POA: Diagnosis not present

## 2016-10-24 DIAGNOSIS — H35343 Macular cyst, hole, or pseudohole, bilateral: Secondary | ICD-10-CM | POA: Diagnosis not present

## 2016-10-24 DIAGNOSIS — H35373 Puckering of macula, bilateral: Secondary | ICD-10-CM | POA: Diagnosis not present

## 2016-10-24 DIAGNOSIS — H02403 Unspecified ptosis of bilateral eyelids: Secondary | ICD-10-CM | POA: Diagnosis not present

## 2016-10-24 HISTORY — DX: Discitis, unspecified, thoracic region: M46.44

## 2016-10-24 LAB — CBC WITH DIFFERENTIAL/PLATELET
Basophils Absolute: 0 10*3/uL (ref 0.0–0.1)
Basophils Relative: 0 %
EOS ABS: 0.2 10*3/uL (ref 0.0–0.7)
EOS PCT: 3 %
HCT: 35.1 % — ABNORMAL LOW (ref 39.0–52.0)
HEMOGLOBIN: 12.1 g/dL — AB (ref 13.0–17.0)
LYMPHS ABS: 1.7 10*3/uL (ref 0.7–4.0)
Lymphocytes Relative: 22 %
MCH: 31.3 pg (ref 26.0–34.0)
MCHC: 34.5 g/dL (ref 30.0–36.0)
MCV: 90.9 fL (ref 78.0–100.0)
Monocytes Absolute: 0.6 10*3/uL (ref 0.1–1.0)
Monocytes Relative: 8 %
Neutro Abs: 5.4 10*3/uL (ref 1.7–7.7)
Neutrophils Relative %: 67 %
Platelets: 230 10*3/uL (ref 150–400)
RBC: 3.86 MIL/uL — ABNORMAL LOW (ref 4.22–5.81)
RDW: 13.5 % (ref 11.5–15.5)
WBC: 8 10*3/uL (ref 4.0–10.5)

## 2016-10-24 LAB — COMPREHENSIVE METABOLIC PANEL
ALK PHOS: 109 U/L (ref 38–126)
ALT: 31 U/L (ref 17–63)
AST: 57 U/L — ABNORMAL HIGH (ref 15–41)
Albumin: 4 g/dL (ref 3.5–5.0)
Anion gap: 7 (ref 5–15)
BUN: 16 mg/dL (ref 6–20)
CHLORIDE: 102 mmol/L (ref 101–111)
CO2: 28 mmol/L (ref 22–32)
CREATININE: 0.78 mg/dL (ref 0.61–1.24)
Calcium: 10 mg/dL (ref 8.9–10.3)
GFR calc Af Amer: >60 mL/min (ref >60)
GLUCOSE: 117 mg/dL — AB (ref 65–99)
POTASSIUM: 3.8 mmol/L (ref 3.5–5.1)
SODIUM: 137 mmol/L (ref 135–145)
Total Bilirubin: 0.5 mg/dL (ref 0.3–1.2)
Total Protein: 7 g/dL (ref 6.5–8.1)

## 2016-10-24 LAB — TROPONIN I
Troponin I: <0.03 ng/mL (ref <0.03)
Troponin I: <0.03 ng/mL (ref <0.03)

## 2016-10-24 LAB — LIPASE, BLOOD: Lipase: 18 U/L (ref 11–51)

## 2016-10-24 MED ORDER — OMEPRAZOLE 20 MG PO CPDR: 20.0000 mg | DELAYED_RELEASE_CAPSULE | Freq: Every day | ORAL | 0 refills | Status: DC

## 2016-10-24 MED ORDER — PANTOPRAZOLE SODIUM 40 MG IV SOLR
40.0000 mg | Freq: Once | INTRAVENOUS | Status: AC
  Administered 2016-10-24: 40 mg via INTRAVENOUS
  Filled 2016-10-24: qty 40

## 2016-10-24 MED ORDER — SUCRALFATE 1 G PO TABS
1.0000 g | ORAL_TABLET | Freq: Once | ORAL | Status: AC
  Administered 2016-10-24: 1 g via ORAL
  Filled 2016-10-24: qty 1

## 2016-10-24 NOTE — ED Notes (Signed)
C/o mid sternal cp x 1 hour  Denies n/v/  No sob  States pain is not as bad now as it was

## 2016-10-24 NOTE — ED Triage Notes (Signed)
Pt reports sudden onset of chest pain described x 30 min PTA while laying in bed.

## 2016-10-24 NOTE — ED Provider Notes (Signed)
[facility] DEPT [facility] Provider Note: Georgena Spurling, MD, FACEP  CSN: [id number] MRN: [id number] ARRIVAL: 10/24/16 at [place]: [place]  Chest Pain   HISTORY OF PRESENT ILLNESS  Tanner Rice is a 83 y.o. male with a history of thoracic discitis diagnosed several months ago for which he continues to take doxycycline. The pain in his spine from this has been steadily improving with treatment. He frequently takes Aleve at night for this pain which relieves it.   He is here with chest pain that came on suddenly about an hour prior to arrival. He localizes the pain to his lower sternal and epigastric regions. He describes the pain as a "knot". He rated it as an 8 initially but states it is improved on its own. There has been no associated shortness of breath, nausea, vomiting or diaphoresis. The pain does not change with deep breathing. The pain does not radiate. He has edema of the lower extremities which he states is chronic.    Past Medical History:  Diagnosis Date  . Hypertension   . Melanoma ([place])   . Rheumatoid arthritis(714.0)   . Thoracic discitis     Past Surgical History:  Procedure Laterality Date  . hole in retina surgery    . IR GENERIC HISTORICAL  04/24/2016   IR US GUIDE VASC ACCESS RIGHT 04/24/2016 Arne Cleveland, MD [facility]INTER[facility]  . IR GENERIC HISTORICAL  04/24/2016   IR FLUORO GUIDE CV LINE RIGHT 04/24/2016 Arne Cleveland, MD [facility]  . TEE WITHOUT CARDIOVERSION N/A 02/02/2016   Procedure: TRANSESOPHAGEAL ECHOCARDIOGRAM (TEE);  Surgeon: Jerline Pain, MD;  Location: [facility] ENDOSCOPY;  Service: Cardiovascular;  Laterality: N/A;  . TONSILLECTOMY      Family History  Problem Relation Age of Onset  . Prostate cancer Father   . Prostate cancer Brother   . Osteoarthritis Brother     Social History  Substance Use Topics  . Smoking status: Former Research scientist (life sciences)  . Smokeless tobacco: Never Used  . Alcohol use Yes     Comment: glass of wine a day      Prior to Admission medications   Medication Sig Start Date End Date Taking? Authorizing Provider  acetaminophen (TYLENOL) 325 MG tablet Take 2 tablets (650 mg total) by mouth every 6 (six) hours as needed for mild pain (or Fever >/= 101). 02/06/16   Domenic Polite, MD  amLODipine (NORVASC) 10 MG tablet Take 1 tablet (10 mg total) by mouth daily. 02/07/16   Kelvin Cellar, MD  aspirin EC 81 MG tablet Take 81 mg by mouth daily.    [provider]  calcium citrate-vitamin D (CITRACAL+D) 315-200 MG-UNIT tablet Take 1 tablet by mouth 2 (two) times a week.    [provider]  cyanocobalamin 500 MCG tablet Take 500 mcg by mouth daily.      [provider]  doxycycline (VIBRA-TABS) 100 MG tablet Take 1 tablet (100 mg total) by mouth 2 (two) times daily. 08/06/16   Michel Bickers, MD  glucosamine-chondroitin 500-400 MG tablet Take 1 tablet by mouth daily.    [provider]  lidocaine (LIDODERM) 5 % Place 1 patch onto the skin daily. Remove & Discard patch within 12 hours or as directed by MD Patient not taking: Reported on 06/18/2016 02/07/16   Kelvin Cellar, MD  lisinopril (PRINIVIL,ZESTRIL) 10 MG tablet Take 10 mg by mouth daily.      [provider]  Melatonin 3 MG TABS Take 3 mg  by mouth at bedtime.     [provider]  metoprolol succinate (TOPROL-XL) 50 MG 24 hr tablet Take 1 tablet (50 mg total) by mouth daily. Take with or immediately following a meal. 02/07/16   Kelvin Cellar, MD  Multiple Vitamin (MULTIVITAMIN) tablet Take 1 tablet by mouth daily.      [provider]  naproxen (NAPROSYN) 375 MG tablet Take 1 tablet (375 mg total) by mouth 2 (two) times daily with a meal. For 5 days Patient not taking: Reported on 06/18/2016 02/06/16   Domenic Polite, MD  polyethylene glycol [facility] / Floria Raveling) packet Take 17 g by mouth 2 (two) times daily. Patient not taking: Reported on 06/18/2016 02/06/16   Domenic Polite, MD  senna-docusate  (SENOKOT-S) 8.6-50 MG tablet Take 1 tablet by mouth 2 (two) times daily. Patient not taking: Reported on 06/18/2016 02/06/16   Domenic Polite, MD  tamsulosin (FLOMAX) 0.4 MG CAPS capsule Take 0.4 mg by mouth daily after supper.    [provider]    Allergies Other and Penicillins cross reactors   REVIEW OF SYSTEMS  Negative except as noted here or in the History of Present Illness.   PHYSICAL EXAMINATION  Initial Vital Signs Blood pressure (!) 144/74, pulse (!) 58, temperature 98.2 F (36.8 C), temperature source Oral, resp. rate 18, height 5\' 6"  (1.676 m), weight 153 lb (69.4 kg), SpO2 100 %.  Examination General: Well-developed, well-nourished male in no acute distress; appearance consistent with age of record HENT: normocephalic; atraumatic Eyes: pupils equal, round and reactive to light; extraocular muscles intact; lens implants Neck: supple Heart: regular rate and rhythm Lungs: clear to auscultation bilaterally Chest: Mild lower sternal tenderness Abdomen: soft; nondistended; mild epigastric tenderness; no masses or hepatosplenomegaly; bowel sounds present Extremities: No deformity; full range of motion; pulses normal; +1 edema of the lower legs Neurologic: Awake, alert and oriented; motor function intact in all extremities and symmetric; no facial droop Skin: Warm and dry Psychiatric: Normal mood and affect   RESULTS  Summary of this visit's results, reviewed by myself:   EKG Interpretation  Date/Time:  Thursday Oct 24 2016 01:24:28 EDT Ventricular Rate:  59 PR Interval:    QRS Duration: 149 QT Interval:  477 QTC Calculation: 473 R Axis:   -26 Text Interpretation:  Sinus rhythm Prolonged PR interval Right bundle branch block Previously SVT Confirmed by Kaspian Muccio, Jenny Reichmann [phone]) on 10/24/2016 1:28:59 AM      Laboratory Studies: Results for orders placed or performed during the hospital encounter of 10/24/16 (from the past 24 hour(s))  CBC with Differential      Status: Abnormal   Collection Time: 10/24/16  1:31 AM  Result Value Ref Range   WBC 8.0 4.0 - 10.5 K/uL   RBC 3.86 (L) 4.22 - 5.81 MIL/uL   Hemoglobin 12.1 (L) 13.0 - 17.0 g/dL   HCT 35.1 (L) 39.0 - 52.0 %   MCV 90.9 78.0 - 100.0 fL   MCH 31.3 26.0 - 34.0 pg   MCHC 34.5 30.0 - 36.0 g/dL   RDW 13.5 11.5 - 15.5 %   Platelets 230 150 - 400 K/uL   Neutrophils Relative % 67 %   Neutro Abs 5.4 1.7 - 7.7 K/uL   Lymphocytes Relative 22 %   Lymphs Abs 1.7 0.7 - 4.0 K/uL   Monocytes Relative 8 %   Monocytes Absolute 0.6 0.1 - 1.0 K/uL   Eosinophils Relative 3 %   Eosinophils Absolute 0.2 0.0 - 0.7 K/uL  Basophils Relative 0 %   Basophils Absolute 0.0 0.0 - 0.1 K/uL  Comprehensive metabolic panel     Status: Abnormal   Collection Time: 10/24/16  1:31 AM  Result Value Ref Range   Sodium 137 135 - 145 mmol/L   Potassium 3.8 3.5 - 5.1 mmol/L   Chloride 102 101 - 111 mmol/L   CO2 28 22 - 32 mmol/L   Glucose, Bld 117 (H) 65 - 99 mg/dL   BUN 16 6 - 20 mg/dL   Creatinine, Ser 0.78 0.61 - 1.24 mg/dL   Calcium 10.0 8.9 - 10.3 mg/dL   Total Protein 7.0 6.5 - 8.1 g/dL   Albumin 4.0 3.5 - 5.0 g/dL   AST 57 (H) 15 - 41 U/L   ALT 31 17 - 63 U/L   Alkaline Phosphatase 109 38 - 126 U/L   Total Bilirubin 0.5 0.3 - 1.2 mg/dL   GFR calc non Af Amer >60 >60 mL/min   GFR calc Af Amer >60 >60 mL/min   Anion gap 7 5 - 15  Troponin I     Status: None   Collection Time: 10/24/16  1:31 AM  Result Value Ref Range   Troponin I <0.03 <0.03 ng/mL  Lipase, blood     Status: None   Collection Time: 10/24/16  1:43 AM  Result Value Ref Range   Lipase 18 11 - 51 U/L  Troponin I     Status: None   Collection Time: 10/24/16  4:14 AM  Result Value Ref Range   Troponin I <0.03 <0.03 ng/mL   Imaging Studies: Dg Chest 2 View  Result Date: 10/24/2016 CLINICAL DATA:  Mid chest pain for 1 hour. EXAM: CHEST  2 VIEW COMPARISON:  Chest radiograph 09/06/2015 FINDINGS: Borderline mild cardiomegaly. Tortuous thoracic  aorta. No pulmonary edema. Abnormal density projecting posteriorly on the lateral view likely secondary to overlapping osseous structures and left lobe scarring. No consolidation to suggest pneumonia. No pleural fluid or pneumothorax. Chronic changes in midthoracic spine. No acute osseous abnormalities are seen. IMPRESSION: No evidence of acute abnormality. Borderline mild cardiomegaly with tortuous thoracic aorta. Electronically Signed   By: Jeb Levering M.D.   On: 10/24/2016 01:56    ED COURSE  Nursing notes and initial vitals signs, including pulse oximetry, reviewed.  Vitals:   10/24/16 0130 10/24/16 0200 10/24/16 0230 10/24/16 0300  BP: 122/69 (!) 148/77 116/64 128/66  Pulse: (!) 54 (!) 59 (!) 50 (!) 50  Resp: (!) 22 18 17 15   Temp:      TempSrc:      SpO2: 100% 100% 96% 94%  Weight:      Height:       3:27 AM Pain-free after Carafate orally.  4:56 AM Patient continues to be pain-free. He was given Protonix 40 milligrams IV. We will start him on a PPI course. I suspect his pain represents gastritis due to frequent use of Aleve and he was advised of this.  PROCEDURES    ED DIAGNOSES     ICD-9-CM ICD-10-CM   1. Epigastric pain 789.06 R10.13        Shanon Rosser, MD 10/24/16 [phone]

## 2016-11-28 DIAGNOSIS — Z0000 Encounter for general adult medical examination without abnormal findings: Secondary | ICD-10-CM | POA: Diagnosis not present

## 2016-11-28 DIAGNOSIS — M069 Rheumatoid arthritis, unspecified: Secondary | ICD-10-CM | POA: Diagnosis not present

## 2016-11-28 DIAGNOSIS — I10 Essential (primary) hypertension: Secondary | ICD-10-CM | POA: Diagnosis not present

## 2016-11-28 DIAGNOSIS — B958 Unspecified staphylococcus as the cause of diseases classified elsewhere: Secondary | ICD-10-CM | POA: Diagnosis not present

## 2016-11-28 DIAGNOSIS — Z1389 Encounter for screening for other disorder: Secondary | ICD-10-CM | POA: Diagnosis not present

## 2016-11-28 DIAGNOSIS — E7800 Pure hypercholesterolemia, unspecified: Secondary | ICD-10-CM | POA: Diagnosis not present

## 2016-11-28 DIAGNOSIS — Z79899 Other long term (current) drug therapy: Secondary | ICD-10-CM | POA: Diagnosis not present

## 2016-11-28 DIAGNOSIS — I700 Atherosclerosis of aorta: Secondary | ICD-10-CM | POA: Diagnosis not present

## 2016-12-06 DIAGNOSIS — Z79899 Other long term (current) drug therapy: Secondary | ICD-10-CM | POA: Diagnosis not present

## 2016-12-06 DIAGNOSIS — M0579 Rheumatoid arthritis with rheumatoid factor of multiple sites without organ or systems involvement: Secondary | ICD-10-CM | POA: Diagnosis not present

## 2016-12-26 ENCOUNTER — Encounter: Payer: Self-pay | Admitting: Internal Medicine

## 2016-12-26 ENCOUNTER — Ambulatory Visit (INDEPENDENT_AMBULATORY_CARE_PROVIDER_SITE_OTHER): Payer: Medicare Other | Admitting: Internal Medicine

## 2016-12-26 DIAGNOSIS — M4644 Discitis, unspecified, thoracic region: Secondary | ICD-10-CM

## 2016-12-26 NOTE — Progress Notes (Signed)
[facility] for Infectious Disease  Patient Active Problem List   Diagnosis Date Noted  . MRSA bacteremia     Priority: High  . Thoracic discitis     Priority: High  . Constipation 05/07/2016    Priority: Medium  . Protein-calorie malnutrition ([place]) 05/07/2016    Priority: Medium  . Ileus ([place])   . Hyperglycemia 01/30/2016  . Normocytic anemia 01/30/2016  . Thrombocytopenia ([place]) 01/30/2016  . Atherosclerotic peripheral vascular disease ([place]) 01/30/2016  . Degenerative joint disease of spine 01/30/2016  . BPH (benign prostatic hyperplasia) 01/30/2016  . Rheumatoid arthritis involving multiple sites with positive rheumatoid factor ([place]) 01/29/2016  . Essential hypertension 01/29/2016  . Hyponatremia 01/29/2016  . Left adrenal mass ([place]) 01/29/2016    Patient's Medications  New Prescriptions   No medications on file  Previous Medications   ACETAMINOPHEN (TYLENOL) 325 MG TABLET    Take 2 tablets (650 mg total) by mouth every 6 (six) hours as needed for mild pain (or Fever >/= 101).   AMLODIPINE (NORVASC) 10 MG TABLET    Take 1 tablet (10 mg total) by mouth daily.   ASPIRIN EC 81 MG TABLET    Take 81 mg by mouth daily.   CALCIUM CITRATE-VITAMIN D (CITRACAL+D) 315-200 MG-UNIT TABLET    Take 1 tablet by mouth 2 (two) times a week.   CYANOCOBALAMIN 500 MCG TABLET    Take 500 mcg by mouth daily.     DOXYCYCLINE (VIBRA-TABS) 100 MG TABLET    Take 1 tablet (100 mg total) by mouth 2 (two) times daily.   GLUCOSAMINE-CHONDROITIN 500-400 MG TABLET    Take 1 tablet by mouth daily.   LISINOPRIL (PRINIVIL,ZESTRIL) 10 MG TABLET    Take 10 mg by mouth daily.     MELATONIN 3 MG TABS    Take 3 mg by mouth at bedtime.    METOPROLOL SUCCINATE (TOPROL-XL) 50 MG 24 HR TABLET    Take 1 tablet (50 mg total) by mouth daily. Take with or immediately following a meal.   MULTIPLE VITAMIN (MULTIVITAMIN) TABLET    Take 1 tablet by mouth daily.     OMEPRAZOLE (PRILOSEC) 20 MG CAPSULE    Take  1 capsule (20 mg total) by mouth daily.   TAMSULOSIN (FLOMAX) 0.4 MG CAPS CAPSULE    Take 0.4 mg by mouth daily after supper.  Modified Medications   No medications on file  Discontinued Medications   No medications on file    Subjective: Tanner Rice is in with his wife today for his routine follow-up visit. He continues on chronic suppressive doxycycline therapy for his recurrent MRSA thoracic discitis. He is tolerating it without difficulty. He is no longer having any left-sided chest pain. He only occasionally has some dull aching discomfort in his right chest. This occurs when he first gets in bed at night and resolves within a few minutes on its own.  He has been off of Remicade and methotrexate since he developed MRSA bacteremia and discitis last year. He has arthritis is flaring up with pain and swelling in both shoulders his left elbow and both hands. He recently saw his rheumatologist, Dr. Florene Glen, who started him on azulfidine.  Review of Systems: Review of Systems  Constitutional: Negative for chills, diaphoresis, fever and malaise/fatigue.  Cardiovascular: Positive for chest pain.  Gastrointestinal: Negative for abdominal pain, diarrhea, nausea and vomiting.  Musculoskeletal: Positive for joint pain.  Neurological: Positive for weakness.  Past Medical History:  Diagnosis Date  . Hypertension   . Melanoma ([place])   . Rheumatoid arthritis(714.0)   . Thoracic discitis     Social History  Substance Use Topics  . Smoking status: Former Research scientist (life sciences)  . Smokeless tobacco: Never Used  . Alcohol use Yes     Comment: glass of wine a day     Family History  Problem Relation Age of Onset  . Prostate cancer Father   . Prostate cancer Brother   . Osteoarthritis Brother     Allergies  Allergen Reactions  . Other Diarrhea, Nausea And Vomiting and Other (See Comments)    Seafood - muscles   . Penicillins Cross Reactors Hives    Objective: There were no vitals filed for  this visit. There is no height or weight on file to calculate BMI.  Physical Exam  Constitutional: He is oriented to person, place, and time.  He is in good spirits.  Cardiovascular: Normal rate and regular rhythm.   Murmur heard. 1/6 systolic murmur heard best at the right upper sternal border.  Pulmonary/Chest: Effort normal and breath sounds normal.  Abdominal: Soft. There is no tenderness.  Musculoskeletal:  Prominent swelling over the right and left second and third MCP joints.  Neurological: He is alert and oriented to person, place, and time.  Skin: No rash noted.  Psychiatric: Mood and affect normal.    Lab Results    Problem List Items Addressed This Visit      High   Thoracic discitis    He is tolerating chronic, suppressive doxycycline therapy for recurrent MRSA thoracic discitis. He is nearly symptom free from his discitis. However his rheumatoid arthritis is flaring off of Remicade and methotrexate. If he does not respond well to azulfidine I think it would be reasonable to restart Remicade and/or methotrexate if Dr. Abner Greenspan feels that is needed. He is hoping to return to part-time work. I gave him a note today releasing him to work without restrictions.          Tanner Bickers, MD [facility] [place] [facility] [phone] pager   [phone] cell 12/26/2016, 3:26 PM

## 2016-12-26 NOTE — Assessment & Plan Note (Signed)
He is tolerating chronic, suppressive doxycycline therapy for recurrent MRSA thoracic discitis. He is nearly symptom free from his discitis. However his rheumatoid arthritis is flaring off of Remicade and methotrexate. If he does not respond well to azulfidine I think it would be reasonable to restart Remicade and/or methotrexate if Dr. Abner Greenspan feels that is needed. He is hoping to return to part-time work. I gave him a note today releasing him to work without restrictions.

## 2016-12-27 NOTE — Assessment & Plan Note (Signed)
He is tolerating chronic, suppressive doxycycline therapy for recurrent MRSA thoracic discitis. He is nearly symptom-free from his discitis. However his rheumatoid arthritis is flaring off of Remicade and methotrexate. If he does not respond well to azulfidine I think it would be reasonable to restart Remicade and/or methotrexate if Dr. Abner Greenspan feels that as needed. He is hoping to return to part-time work. I gave him a note today releasing him to work without restrictions.

## 2016-12-27 NOTE — Progress Notes (Signed)
[facility] for Infectious Disease  Patient Active Problem List   Diagnosis Date Noted  . MRSA bacteremia     Priority: High  . Thoracic discitis     Priority: High  . Constipation 05/07/2016    Priority: Medium  . Protein-calorie malnutrition ([place]) 05/07/2016    Priority: Medium  . Ileus ([place])   . Hyperglycemia 01/30/2016  . Normocytic anemia 01/30/2016  . Thrombocytopenia ([place]) 01/30/2016  . Atherosclerotic peripheral vascular disease ([place]) 01/30/2016  . Degenerative joint disease of spine 01/30/2016  . BPH (benign prostatic hyperplasia) 01/30/2016  . Rheumatoid arthritis involving multiple sites with positive rheumatoid factor ([place]) 01/29/2016  . Essential hypertension 01/29/2016  . Hyponatremia 01/29/2016  . Left adrenal mass ([place]) 01/29/2016    Patient's Medications  New Prescriptions   No medications on file  Previous Medications   ACETAMINOPHEN (TYLENOL) 325 MG TABLET    Take 2 tablets (650 mg total) by mouth every 6 (six) hours as needed for mild pain (or Fever >/= 101).   AMLODIPINE (NORVASC) 10 MG TABLET    Take 1 tablet (10 mg total) by mouth daily.   ASPIRIN EC 81 MG TABLET    Take 81 mg by mouth daily.   CALCIUM CITRATE-VITAMIN D (CITRACAL+D) 315-200 MG-UNIT TABLET    Take 1 tablet by mouth 2 (two) times a week.   CYANOCOBALAMIN 500 MCG TABLET    Take 500 mcg by mouth daily.     DOXYCYCLINE (VIBRA-TABS) 100 MG TABLET    Take 1 tablet (100 mg total) by mouth 2 (two) times daily.   GLUCOSAMINE-CHONDROITIN 500-400 MG TABLET    Take 1 tablet by mouth daily.   LISINOPRIL (PRINIVIL,ZESTRIL) 10 MG TABLET    Take 10 mg by mouth daily.     MELATONIN 3 MG TABS    Take 3 mg by mouth at bedtime.    METOPROLOL SUCCINATE (TOPROL-XL) 50 MG 24 HR TABLET    Take 1 tablet (50 mg total) by mouth daily. Take with or immediately following a meal.   MULTIPLE VITAMIN (MULTIVITAMIN) TABLET    Take 1 tablet by mouth daily.     OMEPRAZOLE (PRILOSEC) 20 MG CAPSULE    Take  1 capsule (20 mg total) by mouth daily.   TAMSULOSIN (FLOMAX) 0.4 MG CAPS CAPSULE    Take 0.4 mg by mouth daily after supper.  Modified Medications   No medications on file  Discontinued Medications   No medications on file    Subjective: Mr. Bankson is in for his routine follow-up visit. He is accompanied by his wife. He continues on chronic, suppressive doxycycline for his relapsed MRSA thoracic vertebral infection. He has had no problems tolerating his doxycycline. He states that he is no longer having any left-sided chest or back pain. He occasionally has some aching on his right side when he first gets in bed at night. This resolved spontaneously. His rheumatoid arthritis is worse recently. He is having pain in both shoulders, his left elbow and both hands. He has been off of Remicade and methotrexate since his MRSA bacteremia and discitis first began. He recently saw his rheumatologist, Dr. Florene Glen, who started him on azulfidine.  Review of Systems: Review of Systems  Constitutional: Negative for chills, diaphoresis and fever.  Respiratory: Negative for cough.   Cardiovascular: Positive for chest pain.  Gastrointestinal: Negative for abdominal pain, diarrhea, nausea and vomiting.  Musculoskeletal: Positive for joint pain.    Past Medical  History:  Diagnosis Date  . Hypertension   . Melanoma ([place])   . Rheumatoid arthritis(714.0)   . Thoracic discitis     Social History  Substance Use Topics  . Smoking status: Former Research scientist (life sciences)  . Smokeless tobacco: Never Used  . Alcohol use Yes     Comment: glass of wine a day     Family History  Problem Relation Age of Onset  . Prostate cancer Father   . Prostate cancer Brother   . Osteoarthritis Brother     Allergies  Allergen Reactions  . Other Diarrhea, Nausea And Vomiting and Other (See Comments)    Seafood - muscles   . Penicillins Cross Reactors Hives    Objective: Vitals:   12/26/16 1454  BP: (!) 165/79  Pulse: 72    Temp: 97.7 F (36.5 C)  TempSrc: Oral  Weight: 157 lb 6.4 oz (71.4 kg)   Body mass index is 25.41 kg/m.  Physical Exam  Constitutional: He is oriented to person, place, and time.  He is in good spirits.  Cardiovascular: Normal rate and regular rhythm.   Murmur heard. 1/6 systolic murmur heard best at the right upper sternal border.  Pulmonary/Chest: Effort normal and breath sounds normal. He has no wheezes. He has no rales.  Abdominal: Soft. There is no tenderness.  Musculoskeletal: He exhibits edema and tenderness.  He has prominent swelling over his right and left second and third MCP joints.  Neurological: He is alert and oriented to person, place, and time.  Skin: No rash noted.  Psychiatric: Mood and affect normal.    Lab Results    Problem List Items Addressed This Visit      High   Thoracic discitis    He is tolerating chronic, suppressive doxycycline therapy for recurrent MRSA thoracic discitis. He is nearly symptom-free from his discitis. However his rheumatoid arthritis is flaring off of Remicade and methotrexate. If he does not respond well to azulfidine I think it would be reasonable to restart Remicade and/or methotrexate if Dr. Abner Greenspan feels that as needed. He is hoping to return to part-time work. I gave him a note today releasing him to work without restrictions.          Michel Bickers, MD [facility] for [facility] [phone] pager   [phone] cell 12/27/2016, 9:23 AM

## 2017-01-07 DIAGNOSIS — Z79899 Other long term (current) drug therapy: Secondary | ICD-10-CM | POA: Diagnosis not present

## 2017-01-07 DIAGNOSIS — M0579 Rheumatoid arthritis with rheumatoid factor of multiple sites without organ or systems involvement: Secondary | ICD-10-CM | POA: Diagnosis not present

## 2017-01-16 ENCOUNTER — Ambulatory Visit: Payer: Medicare Other | Admitting: Internal Medicine

## 2017-01-30 DIAGNOSIS — E611 Iron deficiency: Secondary | ICD-10-CM | POA: Diagnosis not present

## 2017-01-30 DIAGNOSIS — E559 Vitamin D deficiency, unspecified: Secondary | ICD-10-CM | POA: Diagnosis not present

## 2017-02-24 DIAGNOSIS — Z79899 Other long term (current) drug therapy: Secondary | ICD-10-CM | POA: Diagnosis not present

## 2017-02-24 DIAGNOSIS — E7800 Pure hypercholesterolemia, unspecified: Secondary | ICD-10-CM | POA: Diagnosis not present

## 2017-02-25 DIAGNOSIS — M0579 Rheumatoid arthritis with rheumatoid factor of multiple sites without organ or systems involvement: Secondary | ICD-10-CM | POA: Diagnosis not present

## 2017-02-25 DIAGNOSIS — Z79899 Other long term (current) drug therapy: Secondary | ICD-10-CM | POA: Diagnosis not present

## 2017-03-03 DIAGNOSIS — R351 Nocturia: Secondary | ICD-10-CM | POA: Diagnosis not present

## 2017-03-03 DIAGNOSIS — R350 Frequency of micturition: Secondary | ICD-10-CM | POA: Diagnosis not present

## 2017-03-03 DIAGNOSIS — R972 Elevated prostate specific antigen [PSA]: Secondary | ICD-10-CM | POA: Diagnosis not present

## 2017-03-03 DIAGNOSIS — N401 Enlarged prostate with lower urinary tract symptoms: Secondary | ICD-10-CM | POA: Diagnosis not present

## 2017-03-18 DIAGNOSIS — Z23 Encounter for immunization: Secondary | ICD-10-CM | POA: Diagnosis not present

## 2017-05-30 DIAGNOSIS — I10 Essential (primary) hypertension: Secondary | ICD-10-CM | POA: Diagnosis not present

## 2017-05-30 DIAGNOSIS — Z79899 Other long term (current) drug therapy: Secondary | ICD-10-CM | POA: Diagnosis not present

## 2017-05-30 DIAGNOSIS — Z23 Encounter for immunization: Secondary | ICD-10-CM | POA: Diagnosis not present

## 2017-06-05 DIAGNOSIS — M0579 Rheumatoid arthritis with rheumatoid factor of multiple sites without organ or systems involvement: Secondary | ICD-10-CM | POA: Diagnosis not present

## 2017-06-05 DIAGNOSIS — Z79899 Other long term (current) drug therapy: Secondary | ICD-10-CM | POA: Diagnosis not present

## 2017-06-26 ENCOUNTER — Ambulatory Visit (INDEPENDENT_AMBULATORY_CARE_PROVIDER_SITE_OTHER): Payer: Medicare Other | Admitting: Internal Medicine

## 2017-06-26 ENCOUNTER — Encounter: Payer: Self-pay | Admitting: Internal Medicine

## 2017-06-26 DIAGNOSIS — M4644 Discitis, unspecified, thoracic region: Secondary | ICD-10-CM | POA: Diagnosis present

## 2017-06-26 NOTE — Assessment & Plan Note (Signed)
I am worried that he may have an allergic reaction to doxycycline.  He also seemed to be having more pain and a kyphotic deformity.  I will repeat his MRI of to see if he is developed more compression fracture or has any signs of infection.  I will see him back after the scan to determine if we need to stop or change his doxycycline.

## 2017-06-26 NOTE — Progress Notes (Signed)
[facility] for Infectious Disease  Patient Active Problem List   Diagnosis Date Noted  . MRSA bacteremia     Priority: High  . Thoracic discitis     Priority: High  . Constipation 05/07/2016    Priority: Medium  . Protein-calorie malnutrition ([place]) 05/07/2016    Priority: Medium  . Ileus ([place])   . Hyperglycemia 01/30/2016  . Normocytic anemia 01/30/2016  . Thrombocytopenia ([place]) 01/30/2016  . Atherosclerotic peripheral vascular disease ([place]) 01/30/2016  . Degenerative joint disease of spine 01/30/2016  . BPH (benign prostatic hyperplasia) 01/30/2016  . Rheumatoid arthritis involving multiple sites with positive rheumatoid factor ([place]) 01/29/2016  . Essential hypertension 01/29/2016  . Hyponatremia 01/29/2016  . Left adrenal mass ([place]) 01/29/2016    Patient's Medications  New Prescriptions   No medications on file  Previous Medications   ACETAMINOPHEN (TYLENOL) 325 MG TABLET    Take 2 tablets (650 mg total) by mouth every 6 (six) hours as needed for mild pain (or Fever >/= 101).   AMLODIPINE (NORVASC) 10 MG TABLET    Take 1 tablet (10 mg total) by mouth daily.   ASPIRIN EC 81 MG TABLET    Take 81 mg by mouth daily.   CALCIUM CITRATE-VITAMIN D (CITRACAL+D) 315-200 MG-UNIT TABLET    Take 1 tablet by mouth 2 (two) times a week.   CYANOCOBALAMIN 500 MCG TABLET    Take 500 mcg by mouth daily.     DOXYCYCLINE (VIBRA-TABS) 100 MG TABLET    Take 1 tablet (100 mg total) by mouth 2 (two) times daily.   GLUCOSAMINE-CHONDROITIN 500-400 MG TABLET    Take 1 tablet by mouth daily.   LISINOPRIL (PRINIVIL,ZESTRIL) 10 MG TABLET    Take 10 mg by mouth daily.     MELATONIN 3 MG TABS    Take 3 mg by mouth at bedtime.    METOPROLOL SUCCINATE (TOPROL-XL) 50 MG 24 HR TABLET    Take 1 tablet (50 mg total) by mouth daily. Take with or immediately following a meal.   MULTIPLE VITAMIN (MULTIVITAMIN) TABLET    Take 1 tablet by mouth daily.     OMEPRAZOLE (PRILOSEC) 20 MG CAPSULE    Take  1 capsule (20 mg total) by mouth daily.   TAMSULOSIN (FLOMAX) 0.4 MG CAPS CAPSULE    Take 0.4 mg by mouth daily after supper.  Modified Medications   No medications on file  Discontinued Medications   No medications on file    Subjective: Tanner Rice is in for his routine follow-up visit.  He continues on chronic doxycycline for relapsed MRSA thoracic discitis.  About 1 month ago he developed a pruritic rash on his chest.  Dr. Ubaldo Glassing, his dermatologist prescribed hydrocortisone.  The rash is more diffuse now.  He also notes some chronic burning pain on the right side of his chest.  Review of Systems: Review of Systems  Constitutional: Negative for chills, diaphoresis and fever.  Musculoskeletal: Positive for back pain.  Skin: Positive for itching and rash.    Past Medical History:  Diagnosis Date  . Hypertension   . Melanoma ([place])   . Rheumatoid arthritis(714.0)   . Thoracic discitis     Social History   Tobacco Use  . Smoking status: Former Research scientist (life sciences)  . Smokeless tobacco: Never Used  Substance Use Topics  . Alcohol use: Yes    Comment: glass of wine a day   . Drug use: No    Family  History  Problem Relation Age of Onset  . Prostate cancer Father   . Prostate cancer Brother   . Osteoarthritis Brother     Allergies  Allergen Reactions  . Other Diarrhea, Nausea And Vomiting and Other (See Comments)    Seafood - muscles   . Penicillins Cross Reactors Hives    Objective: Vitals:   06/26/17 1013  BP: (!) 167/80  Pulse: 66  Temp: (!) 97.4 F (36.3 C)  TempSrc: Oral  Weight: 154 lb (69.9 kg)   Body mass index is 24.86 kg/m.  Physical Exam  Constitutional:  He seems slightly worried.  Pulmonary/Chest:  Some bony kyphotic deformity in the mid spine.  Skin: Rash noted.  Diffuse, red maculopapular rash.    Lab Results    Problem List Items Addressed This Visit      High   Thoracic discitis    I am worried that he may have an allergic reaction to doxycycline.   He also seemed to be having more pain and a kyphotic deformity.  I will repeat his MRI of to see if he is developed more compression fracture or has any signs of infection.  I will see him back after the scan to determine if we need to stop or change his doxycycline.      Relevant Orders   MR THORACIC SPINE W CONTRAST   MR THORACIC SPINE W WO CONTRAST       Michel Bickers, [facility] for Infectious Disease Tanner Rice [phone] pager   [phone] cell 06/26/2017, 11:12 AM

## 2017-06-29 ENCOUNTER — Ambulatory Visit
Admission: RE | Admit: 2017-06-29 | Discharge: 2017-06-29 | Disposition: A | Discharge status: Home / Self Care | Payer: Medicare Other | Source: Ambulatory Visit | Attending: Internal Medicine | Admitting: Internal Medicine

## 2017-06-29 DIAGNOSIS — M4644 Discitis, unspecified, thoracic region: Secondary | ICD-10-CM

## 2017-06-29 DIAGNOSIS — M4804 Spinal stenosis, thoracic region: Secondary | ICD-10-CM | POA: Diagnosis not present

## 2017-06-29 MED ORDER — GADOBENATE DIMEGLUMINE 529 MG/ML IV SOLN
14.0000 mL | Freq: Once | INTRAVENOUS | Status: AC | PRN
  Administered 2017-06-29: 14 mL via INTRAVENOUS

## 2017-06-30 ENCOUNTER — Other Ambulatory Visit: Payer: Medicare Other

## 2017-07-01 ENCOUNTER — Ambulatory Visit (INDEPENDENT_AMBULATORY_CARE_PROVIDER_SITE_OTHER): Payer: Medicare Other | Admitting: Internal Medicine

## 2017-07-01 DIAGNOSIS — M4644 Discitis, unspecified, thoracic region: Secondary | ICD-10-CM

## 2017-07-01 LAB — CBC WITH DIFFERENTIAL/PLATELET
BASOS ABS: 59 {cells}/uL (ref 0–200)
Basophils Relative: 0.9 %
EOS ABS: 92 {cells}/uL (ref 15–500)
Eosinophils Relative: 1.4 %
HCT: 38.4 % — ABNORMAL LOW (ref 38.5–50.0)
HEMOGLOBIN: 12.7 g/dL — AB (ref 13.2–17.1)
Lymphs Abs: 1188 cells/uL (ref 850–3900)
MCH: 30.4 pg (ref 27.0–33.0)
MCHC: 33.1 g/dL (ref 32.0–36.0)
MCV: 91.9 fL (ref 80.0–100.0)
MONOS PCT: 7 %
MPV: 11.2 fL (ref 7.5–12.5)
Neutro Abs: 4798 cells/uL (ref 1500–7800)
Neutrophils Relative %: 72.7 %
PLATELETS: 188 10*3/uL (ref 140–400)
RBC: 4.18 10*6/uL — ABNORMAL LOW (ref 4.20–5.80)
RDW: 12.6 % (ref 11.0–15.0)
TOTAL LYMPHOCYTE: 18 %
WBC mixed population: 462 cells/uL (ref 200–950)
WBC: 6.6 10*3/uL (ref 3.8–10.8)

## 2017-07-01 LAB — BASIC METABOLIC PANEL
BUN: 21 mg/dL (ref 7–25)
CALCIUM: 10 mg/dL (ref 8.6–10.3)
CHLORIDE: 102 mmol/L (ref 98–110)
CO2: 29 mmol/L (ref 20–32)
Creat: 0.83 mg/dL (ref 0.70–1.11)
GLUCOSE: 107 mg/dL — AB (ref 65–99)
Potassium: 4.3 mmol/L (ref 3.5–5.3)
SODIUM: 137 mmol/L (ref 135–146)

## 2017-07-01 MED ORDER — SULFAMETHOXAZOLE-TRIMETHOPRIM 800-160 MG PO TABS: 1.0000 | ORAL_TABLET | Freq: Two times a day (BID) | ORAL | 11 refills | Status: DC

## 2017-07-01 NOTE — Assessment & Plan Note (Signed)
I am concerned that his recent rash could be an allergic reaction to doxycycline.  I discussed options with him again today and we oral trimethoprim sulfamethoxazole.  He has developed kyphosis at T8-9 but I doubt that he has progressive infection.  I will continue to try to suppress infection with trimethoprim sulfamethoxazole.  He will follow-up in 1 month.

## 2017-07-01 NOTE — Progress Notes (Signed)
[facility] for Infectious Disease  Patient Active Problem List   Diagnosis Date Noted  . MRSA bacteremia     Priority: High  . Thoracic discitis     Priority: High  . Constipation 05/07/2016    Priority: Medium  . Protein-calorie malnutrition ([place]) 05/07/2016    Priority: Medium  . Ileus ([place])   . Hyperglycemia 01/30/2016  . Normocytic anemia 01/30/2016  . Thrombocytopenia ([place]) 01/30/2016  . Atherosclerotic peripheral vascular disease ([place]) 01/30/2016  . Degenerative joint disease of spine 01/30/2016  . BPH (benign prostatic hyperplasia) 01/30/2016  . Rheumatoid arthritis involving multiple sites with positive rheumatoid factor ([place]) 01/29/2016  . Essential hypertension 01/29/2016  . Hyponatremia 01/29/2016  . Left adrenal mass ([place]) 01/29/2016    Patient's Medications  New Prescriptions   SULFAMETHOXAZOLE-TRIMETHOPRIM (BACTRIM DS,SEPTRA DS) 800-160 MG TABLET    Take 1 tablet by mouth 2 (two) times daily.  Previous Medications   ACETAMINOPHEN (TYLENOL) 325 MG TABLET    Take 2 tablets (650 mg total) by mouth every 6 (six) hours as needed for mild pain (or Fever >/= 101).   AMLODIPINE (NORVASC) 10 MG TABLET    Take 1 tablet (10 mg total) by mouth daily.   ASPIRIN EC 81 MG TABLET    Take 81 mg by mouth daily.   CALCIUM CITRATE-VITAMIN D (CITRACAL+D) 315-200 MG-UNIT TABLET    Take 1 tablet by mouth 2 (two) times a week.   CYANOCOBALAMIN 500 MCG TABLET    Take 500 mcg by mouth daily.     GLUCOSAMINE-CHONDROITIN 500-400 MG TABLET    Take 1 tablet by mouth daily.   LISINOPRIL (PRINIVIL,ZESTRIL) 10 MG TABLET    Take 10 mg by mouth daily.     MELATONIN 3 MG TABS    Take 3 mg by mouth at bedtime.    METOPROLOL SUCCINATE (TOPROL-XL) 50 MG 24 HR TABLET    Take 1 tablet (50 mg total) by mouth daily. Take with or immediately following a meal.   MULTIPLE VITAMIN (MULTIVITAMIN) TABLET    Take 1 tablet by mouth daily.     OMEPRAZOLE (PRILOSEC) 20 MG CAPSULE    Take 1 capsule  (20 mg total) by mouth daily.   TAMSULOSIN (FLOMAX) 0.4 MG CAPS CAPSULE    Take 0.4 mg by mouth daily after supper.  Modified Medications   No medications on file  Discontinued Medications   DOXYCYCLINE (VIBRA-TABS) 100 MG TABLET    Take 1 tablet (100 mg total) by mouth 2 (two) times daily.    Subjective: Mr. Linsley is in for his routine follow-up visit.  He has been on chronic doxycycline suppressive therapy ever since he had recurrent MRSA bacteremia complicated by B3-4 discitis and epidural abscess in 2017.  He recently developed a red, spotty rash about 2 months ago.  It is moderately pruritic.  Review of Systems: Review of Systems  Constitutional: Negative for chills, diaphoresis, fever, malaise/fatigue and weight loss.  HENT: Negative for sore throat.   Respiratory: Negative for cough, sputum production and shortness of breath.   Cardiovascular: Negative for chest pain.  Gastrointestinal: Negative for abdominal pain, diarrhea, heartburn, nausea and vomiting.  Musculoskeletal: Positive for back pain. Negative for joint pain.  Skin: Positive for itching and rash.  Neurological: Negative for dizziness and headaches.    Past Medical History:  Diagnosis Date  . Hypertension   . Melanoma ([place])   . Rheumatoid arthritis(714.0)   . Thoracic discitis  Social History   Tobacco Use  . Smoking status: Former Research scientist (life sciences)  . Smokeless tobacco: Never Used  Substance Use Topics  . Alcohol use: Yes    Comment: glass of wine a day   . Drug use: No    Family History  Problem Relation Age of Onset  . Prostate cancer Father   . Prostate cancer Brother   . Osteoarthritis Brother     Allergies  Allergen Reactions  . Other Diarrhea, Nausea And Vomiting and Other (See Comments)    Seafood - muscles   . Penicillins Cross Reactors Hives    Objective: Vitals:   07/01/17 1608  Weight: 153 lb (69.4 kg)  Height: 5\' 6"  (1.676 m)   Body mass index is 24.69 kg/m.  Physical Exam    Constitutional: He is oriented to person, place, and time.  He is in no apparent distress.  Cardiovascular: Normal rate and regular rhythm.  No murmur heard. Pulmonary/Chest: Effort normal and breath sounds normal.  Abdominal: Soft. He exhibits no distension. There is no tenderness.  Musculoskeletal:  Stable kyphotic deformity in his mid spine.  Neurological: He is alert and oriented to person, place, and time.  Skin:  Red maculopapular rash most prominent on trunk.  Psychiatric: Mood and affect normal.   MRI of thoracic spine with and without contrast 06/29/2017  IMPRESSION: Chronic discitis and osteomyelitis at T8-9. Progressive collapse of T8 and T9 with progressive kyphosis since 02/02/2016. Interval resolution of epidural abscess and paraspinous abscess. Cord hyperintensity at this level compatible with chronic myelomalacia.  No new areas of infection in the spine.   Electronically Signed   By: Franchot Gallo M.D.   On: 06/29/2017 16:22    Problem List Items Addressed This Visit      High   Thoracic discitis    I am concerned that his recent rash could be an allergic reaction to doxycycline.  I discussed options with him again today and we oral trimethoprim sulfamethoxazole.  He has developed kyphosis at T8-9 but I doubt that he has progressive infection.  I will continue to try to suppress infection with trimethoprim sulfamethoxazole.  He will follow-up in 1 month.      Relevant Medications   sulfamethoxazole-trimethoprim (BACTRIM DS,SEPTRA DS) 800-160 MG tablet   Other Relevant Orders   CBC w/Diff   Basic metabolic panel       Michel Bickers, MD [facility] for Infectious [place] [phone] pager   [phone] cell 07/01/2017, 4:25 PM

## 2017-07-29 ENCOUNTER — Encounter: Payer: Self-pay | Admitting: Internal Medicine

## 2017-07-29 ENCOUNTER — Ambulatory Visit (INDEPENDENT_AMBULATORY_CARE_PROVIDER_SITE_OTHER): Payer: Medicare Other | Admitting: Internal Medicine

## 2017-07-29 DIAGNOSIS — M4644 Discitis, unspecified, thoracic region: Secondary | ICD-10-CM

## 2017-07-29 NOTE — Progress Notes (Signed)
[facility] for Infectious Disease  Patient Active Problem List   Diagnosis Date Noted  . MRSA bacteremia     Priority: High  . Thoracic discitis     Priority: High  . Constipation 05/07/2016    Priority: Medium  . Protein-calorie malnutrition (Tanner Rice) 05/07/2016    Priority: Medium  . Ileus ([place])   . Hyperglycemia 01/30/2016  . Normocytic anemia 01/30/2016  . Thrombocytopenia ([place]) 01/30/2016  . Atherosclerotic peripheral vascular disease ([place]) 01/30/2016  . Degenerative joint disease of spine 01/30/2016  . BPH (benign prostatic hyperplasia) 01/30/2016  . Rheumatoid arthritis involving multiple sites with positive rheumatoid factor ([place]) 01/29/2016  . Essential hypertension 01/29/2016  . Hyponatremia 01/29/2016  . Left adrenal mass ([place]) 01/29/2016    Patient's Medications  New Prescriptions   No medications on file  Previous Medications   ACETAMINOPHEN (TYLENOL) 325 MG TABLET    Take 2 tablets (650 mg total) by mouth every 6 (six) hours as needed for mild pain (or Fever >/= 101).   AMLODIPINE (NORVASC) 10 MG TABLET    Take 1 tablet (10 mg total) by mouth daily.   ASPIRIN EC 81 MG TABLET    Take 81 mg by mouth daily.   CALCIUM CITRATE-VITAMIN D (CITRACAL+D) 315-200 MG-UNIT TABLET    Take 1 tablet by mouth 2 (two) times a week.   CYANOCOBALAMIN 500 MCG TABLET    Take 500 mcg by mouth daily.     GLUCOSAMINE-CHONDROITIN 500-400 MG TABLET    Take 1 tablet by mouth daily.   LISINOPRIL (PRINIVIL,ZESTRIL) 10 MG TABLET    Take 10 mg by mouth daily.     MELATONIN 3 MG TABS    Take 3 mg by mouth at bedtime.    METOPROLOL SUCCINATE (TOPROL-XL) 50 MG 24 HR TABLET    Take 1 tablet (50 mg total) by mouth daily. Take with or immediately following a meal.   MULTIPLE VITAMIN (MULTIVITAMIN) TABLET    Take 1 tablet by mouth daily.     OMEPRAZOLE (PRILOSEC) 20 MG CAPSULE    Take 1 capsule (20 mg total) by mouth daily.   SULFAMETHOXAZOLE-TRIMETHOPRIM (BACTRIM DS,SEPTRA DS) 800-160  MG TABLET    Take 1 tablet by mouth 2 (two) times daily.   TAMSULOSIN (FLOMAX) 0.4 MG CAPS CAPSULE    Take 0.4 mg by mouth daily after supper.  Modified Medications   No medications on file  Discontinued Medications   No medications on file    Subjective: Tanner Rice is in with his wife for his routine follow-up visit.  1 month ago he switched his chronic doxycycline therapy to trimethoprim sulfamethoxazole because of rash.  He feels like his rash has gotten better over the last month.  He also recently stopped taking calcium, Flomax and melatonin.  His back pain is also better.  Right now, his #1 problem is his low energy level.  He is planning on seeing his primary care physician, Dr. Felipa Rice, next month.  Review of Systems: Review of Systems  Constitutional: Positive for malaise/fatigue. Negative for chills, diaphoresis, fever and weight loss.  Gastrointestinal: Positive for constipation. Negative for abdominal pain, diarrhea, nausea and vomiting.  Musculoskeletal: Positive for back pain.  Skin: Positive for itching and rash.    Past Medical History:  Diagnosis Date  . Hypertension   . Melanoma ([place])   . Rheumatoid arthritis(714.0)   . Thoracic discitis     Social History   Tobacco Use  .  Smoking status: Former Research scientist (life sciences)  . Smokeless tobacco: Never Used  Substance Use Topics  . Alcohol use: Yes    Comment: glass of wine a day   . Drug use: No    Family History  Problem Relation Age of Onset  . Prostate cancer Father   . Prostate cancer Brother   . Osteoarthritis Brother     Allergies  Allergen Reactions  . Other Diarrhea, Nausea And Vomiting and Other (See Comments)    Seafood - muscles   . Penicillins Cross Reactors Hives    Objective: Vitals:   07/29/17 1022  BP: 128/72  Pulse: 69  Temp: 97.6 F (36.4 C)  TempSrc: Oral  Weight: 155 lb (70.3 kg)   Body mass index is 25.02 kg/m.  Physical Exam  Constitutional: He is oriented to person, place, and  time.  He is in good spirits.  Neurological: He is alert and oriented to person, place, and time.  Stable, mild kyphotic deformity in the mid spine.  Skin: Rash noted.  Is red, maculopapular rash on his trunk and legs has improved but has not resolved completely.  Psychiatric: Mood and affect normal.    Lab Results    Problem List Items Addressed This Visit      High   Thoracic discitis    We discussed again that there is no way to know if his MRSA thoracic spine infection has been cured other than to stop all antibiotics and wait several months to see what happens.  He had a severe relapse after his first round of IV vancomycin and is very reluctant to do this.  I am not certain that his rash was due to doxycycline since it has not resolved completely 1 month after stopping doxycycline the MRI done last month showed some worsened kyphotic deformity around T8-9.  I suspect that his recent increase in back pain may have been due to some fracture and collapse.  After discussing management options he decided to stay on trimethoprim sulfamethoxazole for now.  He will follow-up here in 6 weeks          Tanner Bickers, MD [facility] for [place] [phone] pager   [phone] cell 07/29/2017, 10:46 AM

## 2017-07-29 NOTE — Assessment & Plan Note (Signed)
We discussed again that there is no way to know if his MRSA thoracic spine infection has been cured other than to stop all antibiotics and wait several months to see what happens.  He had a severe relapse after his first round of IV vancomycin and is very reluctant to do this.  I am not certain that his rash was due to doxycycline since it has not resolved completely 1 month after stopping doxycycline the MRI done last month showed some worsened kyphotic deformity around T8-9.  I suspect that his recent increase in back pain may have been due to some fracture and collapse.  After discussing management options he decided to stay on trimethoprim sulfamethoxazole for now.  He will follow-up here in 6 weeks

## 2017-08-27 DIAGNOSIS — Z79899 Other long term (current) drug therapy: Secondary | ICD-10-CM | POA: Diagnosis not present

## 2017-08-27 DIAGNOSIS — R1032 Left lower quadrant pain: Secondary | ICD-10-CM | POA: Diagnosis not present

## 2017-08-27 DIAGNOSIS — I10 Essential (primary) hypertension: Secondary | ICD-10-CM | POA: Diagnosis not present

## 2017-09-04 DIAGNOSIS — M0579 Rheumatoid arthritis with rheumatoid factor of multiple sites without organ or systems involvement: Secondary | ICD-10-CM | POA: Diagnosis not present

## 2017-09-04 DIAGNOSIS — Z79899 Other long term (current) drug therapy: Secondary | ICD-10-CM | POA: Diagnosis not present

## 2017-09-09 ENCOUNTER — Ambulatory Visit (INDEPENDENT_AMBULATORY_CARE_PROVIDER_SITE_OTHER): Payer: Medicare Other | Admitting: Internal Medicine

## 2017-09-09 DIAGNOSIS — M4644 Discitis, unspecified, thoracic region: Secondary | ICD-10-CM | POA: Diagnosis present

## 2017-09-09 NOTE — Assessment & Plan Note (Signed)
He continues chronic suppressive antibiotic therapy for MRSA thoracic discitis that relapsed in 2017.  I cannot tell him if his infection is cured or simply being suppressed.  I believe that his recent, transient back pain was caused by some slight collapse of T8-9 caused by previous infection.  That stabilized and the pain resolved spontaneously.  His rash has not resolved off of doxycycline so I doubt that it is related to his antibiotic therapy.  After discussion of his management options he prefers to continue on chronic suppressive trimethoprim sulfamethoxazole.  He will follow-up here in 4 months.

## 2017-09-09 NOTE — Progress Notes (Signed)
[facility] for Infectious Disease  Patient Active Problem List   Diagnosis Date Noted  . MRSA bacteremia     Priority: High  . Thoracic discitis     Priority: High  . Constipation 05/07/2016    Priority: Medium  . Protein-calorie malnutrition ([place]) 05/07/2016    Priority: Medium  . Ileus ([place])   . Hyperglycemia 01/30/2016  . Normocytic anemia 01/30/2016  . Thrombocytopenia ([place]) 01/30/2016  . Atherosclerotic peripheral vascular disease ([place]) 01/30/2016  . Degenerative joint disease of spine 01/30/2016  . BPH (benign prostatic hyperplasia) 01/30/2016  . Rheumatoid arthritis involving multiple sites with positive rheumatoid factor ([place]) 01/29/2016  . Essential hypertension 01/29/2016  . Hyponatremia 01/29/2016  . Left adrenal mass ([place]) 01/29/2016    Patient's Medications  New Prescriptions   No medications on file  Previous Medications   ACETAMINOPHEN (TYLENOL) 325 MG TABLET    Take 2 tablets (650 mg total) by mouth every 6 (six) hours as needed for mild pain (or Fever >/= 101).   AMLODIPINE (NORVASC) 10 MG TABLET    Take 1 tablet (10 mg total) by mouth daily.   ASPIRIN EC 81 MG TABLET    Take 81 mg by mouth daily.   CALCIUM CITRATE-VITAMIN D (CITRACAL+D) 315-200 MG-UNIT TABLET    Take 1 tablet by mouth 2 (two) times a week.   CYANOCOBALAMIN 500 MCG TABLET    Take 500 mcg by mouth daily.     GLUCOSAMINE-CHONDROITIN 500-400 MG TABLET    Take 1 tablet by mouth daily.   LISINOPRIL (PRINIVIL,ZESTRIL) 10 MG TABLET    Take 10 mg by mouth daily.     MELATONIN 3 MG TABS    Take 3 mg by mouth at bedtime.    METOPROLOL SUCCINATE (TOPROL-XL) 50 MG 24 HR TABLET    Take 1 tablet (50 mg total) by mouth daily. Take with or immediately following a meal.   MULTIPLE VITAMIN (MULTIVITAMIN) TABLET    Take 1 tablet by mouth daily.     OMEPRAZOLE (PRILOSEC) 20 MG CAPSULE    Take 1 capsule (20 mg total) by mouth daily.   SULFAMETHOXAZOLE-TRIMETHOPRIM (BACTRIM DS,SEPTRA DS) 800-160  MG TABLET    Take 1 tablet by mouth 2 (two) times daily.   TAMSULOSIN (FLOMAX) 0.4 MG CAPS CAPSULE    Take 0.4 mg by mouth daily after supper.  Modified Medications   No medications on file  Discontinued Medications   No medications on file    Subjective: Tanner Rice is in for his routine follow-up visit.  He is feeling better.  He has not had any further back pain since his last visit.  He is tolerating trimethoprim sulfamethoxazole well.  He takes one double strength tablet around noon with breakfast.  He decided on his own to take only 1/2 tablet in the evening.  He was tolerating full dose therapy but wanted to see how he felt on only 1/2 tablet at night.  The rash on his chest is gotten better but he still has rash on his legs.  The rash never resolved completely after changing from doxycycline to trimethoprim sulfamethoxazole.  He is working out in [employer] on a regular basis.  Review of Systems: Review of Systems  Constitutional: Negative for chills, fever and malaise/fatigue.  Gastrointestinal: Negative for abdominal pain, diarrhea, nausea and vomiting.  Musculoskeletal: Negative for back pain.  Skin: Positive for itching and rash.    Past Medical History:  Diagnosis Date  .  Hypertension   . Melanoma ([place])   . Rheumatoid arthritis(714.0)   . Thoracic discitis     Social History   Tobacco Use  . Smoking status: Former Research scientist (life sciences)  . Smokeless tobacco: Never Used  Substance Use Topics  . Alcohol use: Yes    Comment: glass of wine a day   . Drug use: No    Family History  Problem Relation Age of Onset  . Prostate cancer Father   . Prostate cancer Brother   . Osteoarthritis Brother     Allergies  Allergen Reactions  . Other Diarrhea, Nausea And Vomiting and Other (See Comments)    Seafood - muscles   . Penicillins Cross Reactors Hives    Objective: Vitals:   09/09/17 1519  Weight: 149 lb (67.6 kg)   Body mass index is 24.05 kg/m.  Physical Exam  Constitutional: He  is oriented to person, place, and time.  He is a good spirits.  Cardiovascular: Normal rate and regular rhythm.  No murmur heard. Pulmonary/Chest: Effort normal. No respiratory distress. He has no wheezes.  Musculoskeletal:  No change in slight kyphotic deformity over lower thoracic spine.  Neurological: He is alert and oriented to person, place, and time.  Skin: Rash noted.  Red, maculopapular rash on chest has improved.  No change in rash on legs.  Psychiatric: Mood and affect normal.    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  I cannot tell him if his infection is cured or simply being suppressed.  I believe that his recent, transient back pain was caused by some slight collapse of T8-9 caused by previous infection.  That stabilized and the pain resolved spontaneously.  His rash has not resolved off of doxycycline so I doubt that it is related to his antibiotic therapy.  After discussion of his management options he prefers to continue on chronic suppressive trimethoprim sulfamethoxazole.  He will follow-up here in 4 months.          Michel Bickers, MD [facility] for [place] [facility] [phone] pager   [phone] cell 09/09/2017, 3:27 PM

## 2017-09-11 DIAGNOSIS — L308 Other specified dermatitis: Secondary | ICD-10-CM | POA: Diagnosis not present

## 2017-10-30 DIAGNOSIS — H35373 Puckering of macula, bilateral: Secondary | ICD-10-CM | POA: Diagnosis not present

## 2017-10-30 DIAGNOSIS — H35343 Macular cyst, hole, or pseudohole, bilateral: Secondary | ICD-10-CM | POA: Diagnosis not present

## 2017-10-30 DIAGNOSIS — H43812 Vitreous degeneration, left eye: Secondary | ICD-10-CM | POA: Diagnosis not present

## 2017-10-30 DIAGNOSIS — H52203 Unspecified astigmatism, bilateral: Secondary | ICD-10-CM | POA: Diagnosis not present

## 2017-12-05 DIAGNOSIS — L723 Sebaceous cyst: Secondary | ICD-10-CM | POA: Diagnosis not present

## 2017-12-05 DIAGNOSIS — C44519 Basal cell carcinoma of skin of other part of trunk: Secondary | ICD-10-CM | POA: Diagnosis not present

## 2017-12-05 DIAGNOSIS — L814 Other melanin hyperpigmentation: Secondary | ICD-10-CM | POA: Diagnosis not present

## 2017-12-05 DIAGNOSIS — L570 Actinic keratosis: Secondary | ICD-10-CM | POA: Diagnosis not present

## 2017-12-05 DIAGNOSIS — C44612 Basal cell carcinoma of skin of right upper limb, including shoulder: Secondary | ICD-10-CM | POA: Diagnosis not present

## 2017-12-05 DIAGNOSIS — D485 Neoplasm of uncertain behavior of skin: Secondary | ICD-10-CM | POA: Diagnosis not present

## 2017-12-05 DIAGNOSIS — Z85820 Personal history of malignant melanoma of skin: Secondary | ICD-10-CM | POA: Diagnosis not present

## 2017-12-05 DIAGNOSIS — L821 Other seborrheic keratosis: Secondary | ICD-10-CM | POA: Diagnosis not present

## 2017-12-05 DIAGNOSIS — D1801 Hemangioma of skin and subcutaneous tissue: Secondary | ICD-10-CM | POA: Diagnosis not present

## 2017-12-24 DIAGNOSIS — Z85828 Personal history of other malignant neoplasm of skin: Secondary | ICD-10-CM | POA: Diagnosis not present

## 2017-12-24 DIAGNOSIS — C44612 Basal cell carcinoma of skin of right upper limb, including shoulder: Secondary | ICD-10-CM | POA: Diagnosis not present

## 2018-01-02 DIAGNOSIS — M069 Rheumatoid arthritis, unspecified: Secondary | ICD-10-CM | POA: Diagnosis not present

## 2018-01-02 DIAGNOSIS — I700 Atherosclerosis of aorta: Secondary | ICD-10-CM | POA: Diagnosis not present

## 2018-01-02 DIAGNOSIS — Z1389 Encounter for screening for other disorder: Secondary | ICD-10-CM | POA: Diagnosis not present

## 2018-01-02 DIAGNOSIS — Z0000 Encounter for general adult medical examination without abnormal findings: Secondary | ICD-10-CM | POA: Diagnosis not present

## 2018-01-02 DIAGNOSIS — I10 Essential (primary) hypertension: Secondary | ICD-10-CM | POA: Diagnosis not present

## 2018-01-02 DIAGNOSIS — E7800 Pure hypercholesterolemia, unspecified: Secondary | ICD-10-CM | POA: Diagnosis not present

## 2018-01-12 ENCOUNTER — Ambulatory Visit: Payer: Medicare Other | Admitting: Internal Medicine

## 2018-01-15 ENCOUNTER — Encounter: Payer: Self-pay | Admitting: Internal Medicine

## 2018-01-15 ENCOUNTER — Ambulatory Visit (INDEPENDENT_AMBULATORY_CARE_PROVIDER_SITE_OTHER): Payer: Medicare Other | Admitting: Internal Medicine

## 2018-01-15 DIAGNOSIS — M4644 Discitis, unspecified, thoracic region: Secondary | ICD-10-CM | POA: Diagnosis not present

## 2018-01-15 NOTE — Progress Notes (Signed)
[facility] for Infectious Disease  Patient Active Problem List   Diagnosis Date Noted  . MRSA bacteremia     Priority: High  . Thoracic discitis     Priority: High  . Constipation 05/07/2016    Priority: Medium  . Protein-calorie malnutrition ([place]) 05/07/2016    Priority: Medium  . Ileus ([place])   . Hyperglycemia 01/30/2016  . Normocytic anemia 01/30/2016  . Thrombocytopenia ([place]) 01/30/2016  . Atherosclerotic peripheral vascular disease ([place]) 01/30/2016  . Degenerative joint disease of spine 01/30/2016  . BPH (benign prostatic hyperplasia) 01/30/2016  . Rheumatoid arthritis involving multiple sites with positive rheumatoid factor ([place]) 01/29/2016  . Essential hypertension 01/29/2016  . Hyponatremia 01/29/2016  . Left adrenal mass ([place]) 01/29/2016    Patient's Medications  New Prescriptions   No medications on file  Previous Medications   ACETAMINOPHEN (TYLENOL) 325 MG TABLET    Take 2 tablets (650 mg total) by mouth every 6 (six) hours as needed for mild pain (or Fever >/= 101).   AMLODIPINE (NORVASC) 10 MG TABLET    Take 1 tablet (10 mg total) by mouth daily.   ASPIRIN EC 81 MG TABLET    Take 81 mg by mouth daily.   CALCIUM CITRATE-VITAMIN D (CITRACAL+D) 315-200 MG-UNIT TABLET    Take 1 tablet by mouth 2 (two) times a week.   CYANOCOBALAMIN 500 MCG TABLET    Take 500 mcg by mouth daily.     GLUCOSAMINE-CHONDROITIN 500-400 MG TABLET    Take 1 tablet by mouth daily.   LISINOPRIL (PRINIVIL,ZESTRIL) 10 MG TABLET    Take 10 mg by mouth daily.     MELATONIN 3 MG TABS    Take 3 mg by mouth at bedtime.    METOPROLOL SUCCINATE (TOPROL-XL) 50 MG 24 HR TABLET    Take 1 tablet (50 mg total) by mouth daily. Take with or immediately following a meal.   MULTIPLE VITAMIN (MULTIVITAMIN) TABLET    Take 1 tablet by mouth daily.     OMEPRAZOLE (PRILOSEC) 20 MG CAPSULE    Take 1 capsule (20 mg total) by mouth daily.   SULFAMETHOXAZOLE-TRIMETHOPRIM (BACTRIM DS,SEPTRA DS) 800-160  MG TABLET    Take 1 tablet by mouth 2 (two) times daily.   TAMSULOSIN (FLOMAX) 0.4 MG CAPS CAPSULE    Take 0.4 mg by mouth daily after supper.  Modified Medications   No medications on file  Discontinued Medications   No medications on file    Subjective: Tanner Rice is in for his routine follow-up visit.  He continues to take chronic trimethoprim sulfamethoxazole as suppressive therapy for his history of severe, recurrent MRSA thoracic discitis.  He is no longer having any severe mid back pain.  The rash on his chest resolved after he saw his dermatologist and was given several topical creams.  He is tolerating trimethoprim sulfamethoxazole well.  Review of Systems: Review of Systems  Constitutional: Negative for chills, diaphoresis and fever.  Cardiovascular: Negative for chest pain.  Gastrointestinal: Positive for constipation. Negative for abdominal pain, diarrhea, nausea and vomiting.  Musculoskeletal: Negative for back pain.  Skin: Negative for itching.    Past Medical History:  Diagnosis Date  . Hypertension   . Melanoma ([place])   . Rheumatoid arthritis(714.0)   . Thoracic discitis     Social History   Tobacco Use  . Smoking status: Former Research scientist (life sciences)  . Smokeless tobacco: Never Used  Substance Use Topics  . Alcohol use: Yes  Comment: glass of wine a day   . Drug use: No    Family History  Problem Relation Age of Onset  . Prostate cancer Father   . Prostate cancer Brother   . Osteoarthritis Brother     Allergies  Allergen Reactions  . Other Diarrhea, Nausea And Vomiting and Other (See Comments)    Seafood - muscles   . Penicillins Cross Reactors Hives    Objective: Vitals:   01/15/18 1534  BP: 136/75  Pulse: (!) 55  Temp: 98.8 F (37.1 C)  Weight: 155 lb (70.3 kg)  Height: 5\' 6"  (1.676 m)   Body mass index is 25.02 kg/m.  Physical Exam  Constitutional: He is oriented to person, place, and time.  He is very pleasant and in no distress today.    Pulmonary/Chest:      Neurological: He is oriented to person, place, and time.  Skin: No rash noted.  Psychiatric: He has a normal mood and affect.    Lab Results    Problem List Items Addressed This Visit      High   Thoracic discitis    There is no evidence of any active infection but I cannot tell if his infection has been cured or is simply being suppressed.  The only way to know would be to stop his antibiotic and wait and see what happens.  He is very afraid of the infection coming back another time and prefers to stay on chronic trimethoprim sulfamethoxazole.  He will follow-up in 6 months.          Michel Bickers, MD [facility] for Infectious [place] [facility] [phone] pager   [phone] cell 01/15/2018, 4:10 PM

## 2018-01-15 NOTE — Assessment & Plan Note (Signed)
There is no evidence of any active infection but I cannot tell if his infection has been cured or is simply being suppressed.  The only way to know would be to stop his antibiotic and wait and see what happens.  He is very afraid of the infection coming back another time and prefers to stay on chronic trimethoprim sulfamethoxazole.  He will follow-up in 6 months.

## 2018-02-25 DIAGNOSIS — R972 Elevated prostate specific antigen [PSA]: Secondary | ICD-10-CM | POA: Diagnosis not present

## 2018-03-05 DIAGNOSIS — N401 Enlarged prostate with lower urinary tract symptoms: Secondary | ICD-10-CM | POA: Diagnosis not present

## 2018-03-05 DIAGNOSIS — R350 Frequency of micturition: Secondary | ICD-10-CM | POA: Diagnosis not present

## 2018-03-05 DIAGNOSIS — R972 Elevated prostate specific antigen [PSA]: Secondary | ICD-10-CM | POA: Diagnosis not present

## 2018-04-02 DIAGNOSIS — C61 Malignant neoplasm of prostate: Secondary | ICD-10-CM | POA: Diagnosis not present

## 2018-04-02 DIAGNOSIS — R972 Elevated prostate specific antigen [PSA]: Secondary | ICD-10-CM | POA: Diagnosis not present

## 2018-04-16 DIAGNOSIS — C61 Malignant neoplasm of prostate: Secondary | ICD-10-CM | POA: Diagnosis not present

## 2018-04-16 DIAGNOSIS — N401 Enlarged prostate with lower urinary tract symptoms: Secondary | ICD-10-CM | POA: Diagnosis not present

## 2018-04-16 DIAGNOSIS — R351 Nocturia: Secondary | ICD-10-CM | POA: Diagnosis not present

## 2018-04-16 DIAGNOSIS — K4090 Unilateral inguinal hernia, without obstruction or gangrene, not specified as recurrent: Secondary | ICD-10-CM | POA: Diagnosis not present

## 2018-05-11 DIAGNOSIS — M0579 Rheumatoid arthritis with rheumatoid factor of multiple sites without organ or systems involvement: Secondary | ICD-10-CM | POA: Diagnosis not present

## 2018-05-11 DIAGNOSIS — Z79899 Other long term (current) drug therapy: Secondary | ICD-10-CM | POA: Diagnosis not present

## 2018-05-11 DIAGNOSIS — D649 Anemia, unspecified: Secondary | ICD-10-CM | POA: Diagnosis not present

## 2018-05-12 IMAGING — CT CT ABDOMEN W/O CM
2 of 4 series · 12 of 36 positions shown, 19 images · IV contrast (READICAT/WATER)
Comparison: 01/28/2016

CLINICAL DATA: Follow-up left adrenal nodule

EXAM:
CT ABDOMEN WITHOUT CONTRAST
TECHNIQUE: Multidetector CT imaging of the abdomen was performed following the
standard protocol without IV contrast.

[Series 3: adrenal (id) · axial · 0.78mm/px · z∈[-194,+43]mm · 11 of 115 slices shown, 17 images]
[im 10/115  soft-tissue]
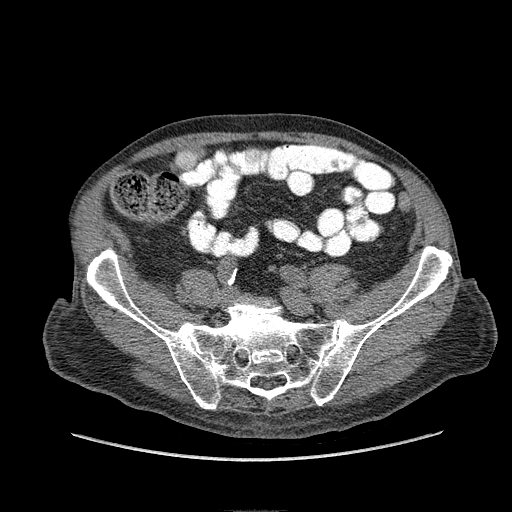
[im 10/115  bone]
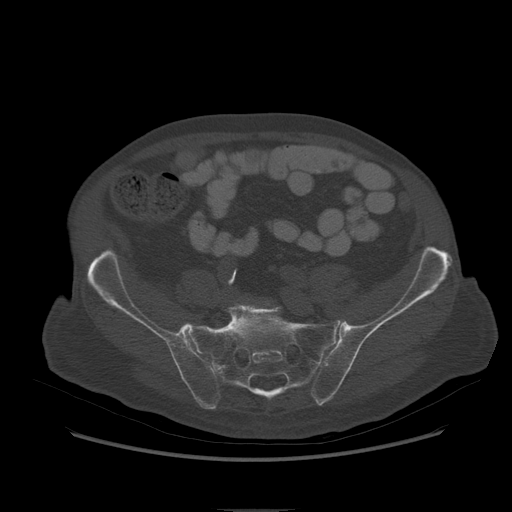
[im 20/115  soft-tissue]
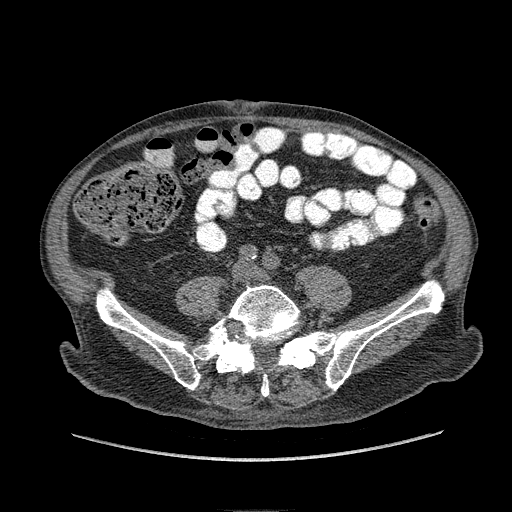
[im 29/115  soft-tissue]
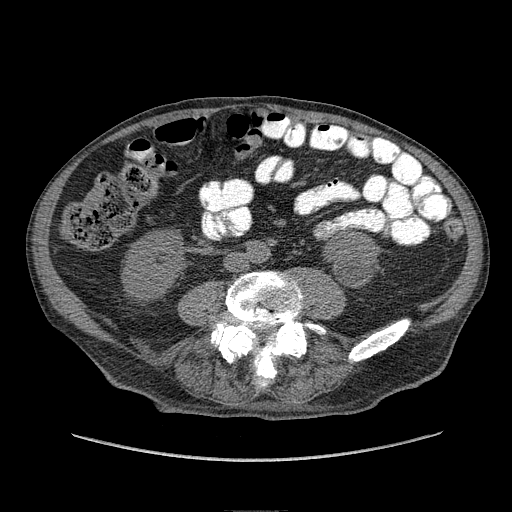
[im 39/115  soft-tissue]
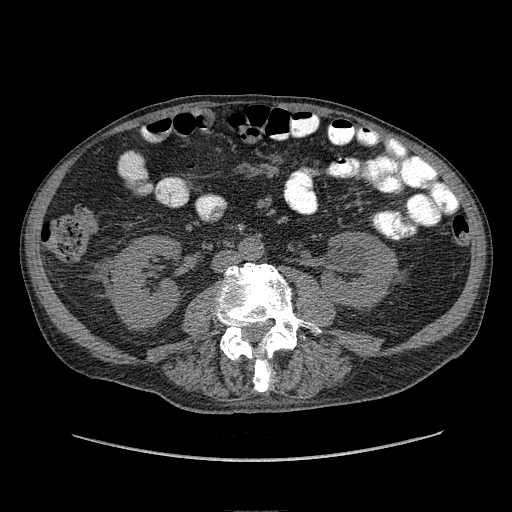
[im 48/115  soft-tissue]
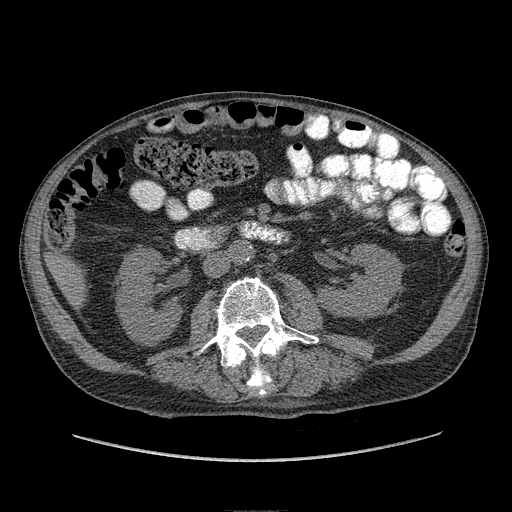
[im 58/115  soft-tissue]
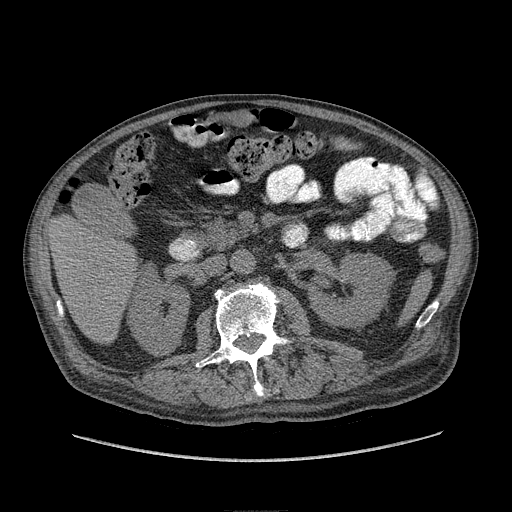
[im 67/115  soft-tissue]
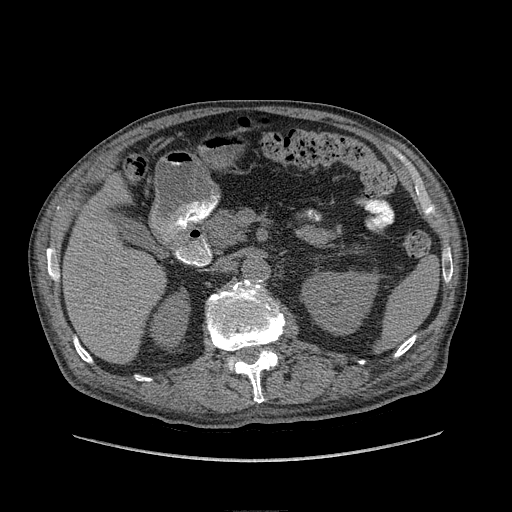
[im 77/115  soft-tissue]
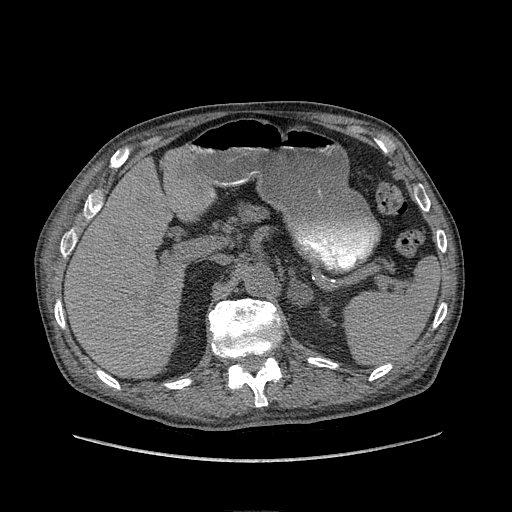
[im 77/115  lung]
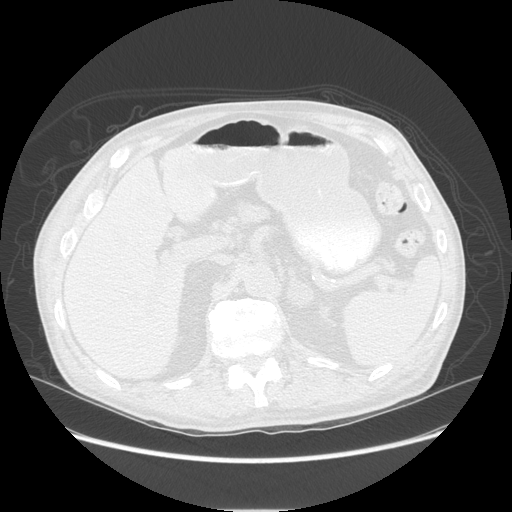
[im 86/115  soft-tissue]
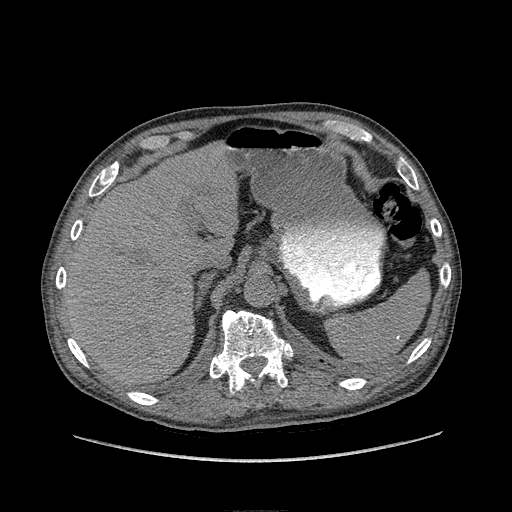
[im 86/115  lung]
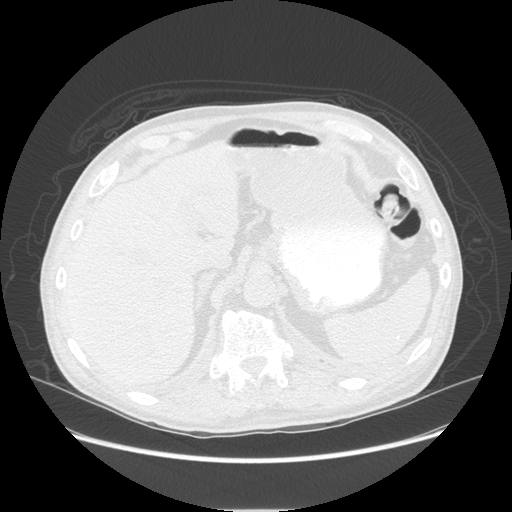
[im 86/115  bone]
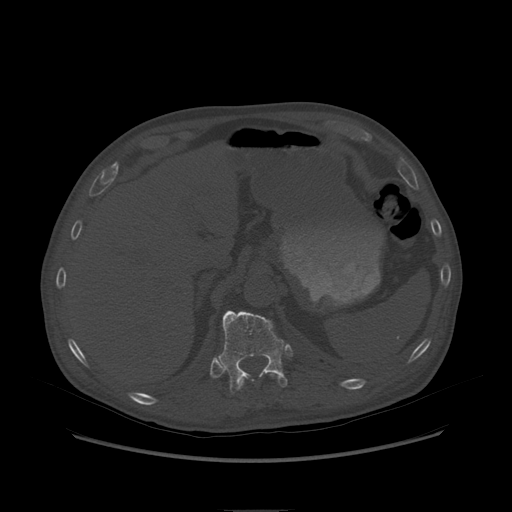
[im 96/115  soft-tissue]
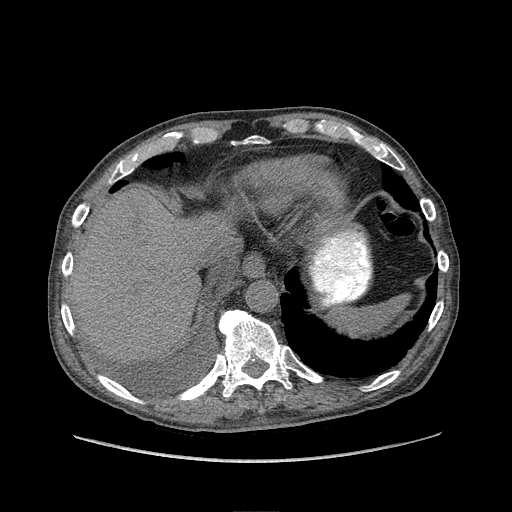
[im 96/115  lung]
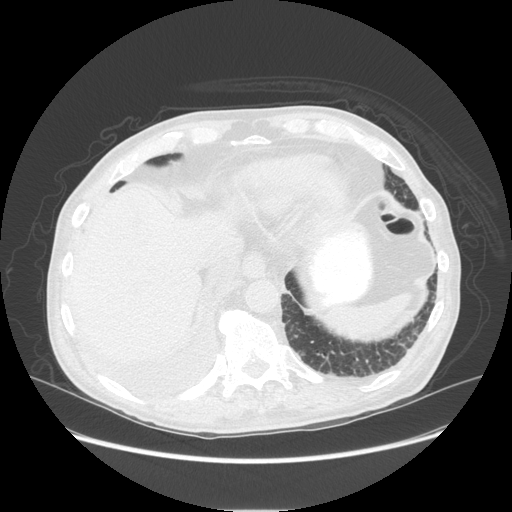
[im 105/115  soft-tissue]
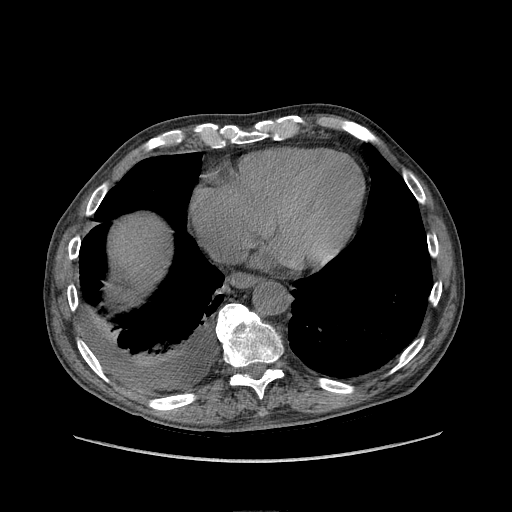
[im 105/115  lung]
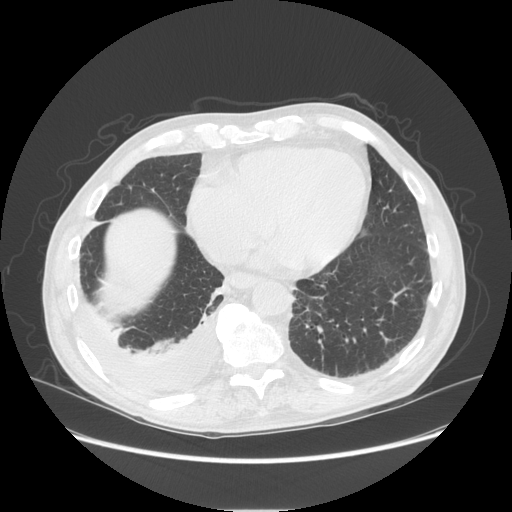

[Series 601: coronal body · coronal · 0.78mm/px · 1 of 113 slices shown, 2 images]
[im 38/113  soft-tissue]
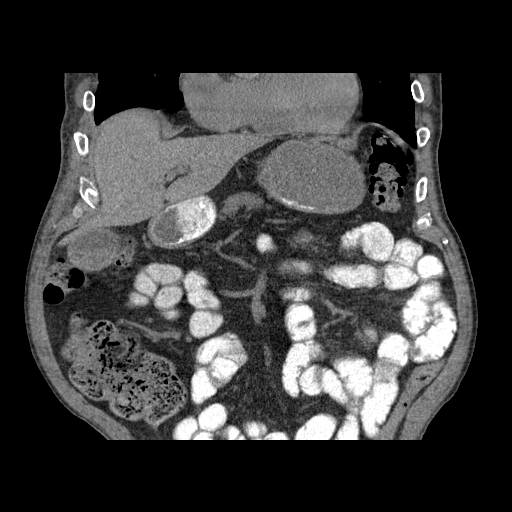
[im 38/113  bone]
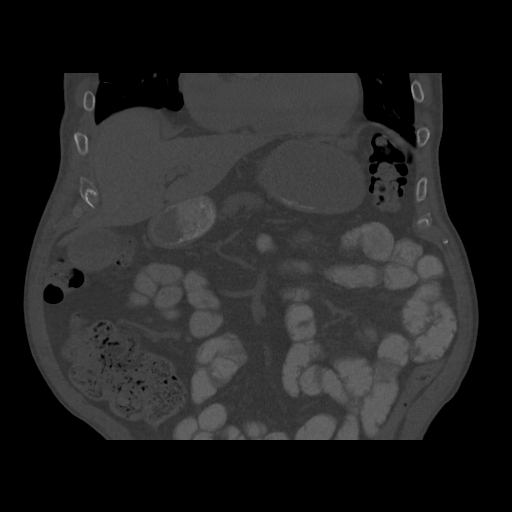

[12 of 36 positions shown; findings below may reference images not displayed]

FINDINGS: Lower chest: There is small right pleural effusion. Right base
posterior atelectasis or infiltrate.

Hepatobiliary: Unenhanced liver shows no biliary ductal dilatation.
No calcified gallstones are noted within gallbladder.

Pancreas: Unenhanced pancreas is unremarkable.

Spleen: Unenhanced spleen is unremarkable.

Adrenals/Urinary Tract: Left adrenal gland is stable in size
measures 2.2 cm. The lesion measures 10 Hounsfield units on
unenhanced images. On the previous scan on arterial enhanced images
the lesion measures 53.4 Hounsfield units. On delay images measures
31 Hounsfield units attenuation. The absolute washout is 51.6%
suggesting a lipid poor adenoma.

Kidneys are symmetrical in size. No hydronephrosis or hydroureter.
Stable parapelvic cyst in lower pole of the left kidney.

Stomach/Bowel: No gastric outlet obstruction. No small bowel
obstruction.

Vascular/Lymphatic: No aortic aneurysm. Mild atherosclerotic
calcifications of abdominal aorta.

Other: No ascites or free air.  No adenopathy.

Musculoskeletal: No destructive bony lesions are noted. Sagittal
images of the spine shows osteopenia and degenerative changes lumbar
spine.
IMPRESSION: 1. Left adrenal gland is stable in size measures 2.2 cm. The lesion
measures 10 Hounsfield units on unenhanced images. On the previous
scan on arterial enhanced images the lesion measures 53.4 Hounsfield
units. On delay images measures 31 Hounsfield units attenuation. The
absolute washout is 51.6% suggesting a lipid poor adenoma. Follow-up
examination in 6 months is recommended to assure stability.
2. No small bowel obstruction.
3. Degenerative changes lumbar spine.

## 2018-05-29 DIAGNOSIS — L570 Actinic keratosis: Secondary | ICD-10-CM | POA: Diagnosis not present

## 2018-05-29 DIAGNOSIS — D485 Neoplasm of uncertain behavior of skin: Secondary | ICD-10-CM | POA: Diagnosis not present

## 2018-05-29 DIAGNOSIS — D692 Other nonthrombocytopenic purpura: Secondary | ICD-10-CM | POA: Diagnosis not present

## 2018-05-29 DIAGNOSIS — Z85828 Personal history of other malignant neoplasm of skin: Secondary | ICD-10-CM | POA: Diagnosis not present

## 2018-05-29 DIAGNOSIS — L720 Epidermal cyst: Secondary | ICD-10-CM | POA: Diagnosis not present

## 2018-05-29 DIAGNOSIS — L080 Pyoderma: Secondary | ICD-10-CM | POA: Diagnosis not present

## 2018-05-29 DIAGNOSIS — L821 Other seborrheic keratosis: Secondary | ICD-10-CM | POA: Diagnosis not present

## 2018-05-29 DIAGNOSIS — L723 Sebaceous cyst: Secondary | ICD-10-CM | POA: Diagnosis not present

## 2018-05-29 DIAGNOSIS — D1801 Hemangioma of skin and subcutaneous tissue: Secondary | ICD-10-CM | POA: Diagnosis not present

## 2018-05-29 DIAGNOSIS — L814 Other melanin hyperpigmentation: Secondary | ICD-10-CM | POA: Diagnosis not present

## 2018-07-06 DIAGNOSIS — I10 Essential (primary) hypertension: Secondary | ICD-10-CM | POA: Diagnosis not present

## 2018-07-06 DIAGNOSIS — K5901 Slow transit constipation: Secondary | ICD-10-CM | POA: Diagnosis not present

## 2018-07-06 DIAGNOSIS — Z79899 Other long term (current) drug therapy: Secondary | ICD-10-CM | POA: Diagnosis not present

## 2018-07-06 DIAGNOSIS — E7800 Pure hypercholesterolemia, unspecified: Secondary | ICD-10-CM | POA: Diagnosis not present

## 2018-07-06 DIAGNOSIS — K921 Melena: Secondary | ICD-10-CM | POA: Diagnosis not present

## 2018-07-06 DIAGNOSIS — K219 Gastro-esophageal reflux disease without esophagitis: Secondary | ICD-10-CM | POA: Diagnosis not present

## 2018-07-06 DIAGNOSIS — J3089 Other allergic rhinitis: Secondary | ICD-10-CM | POA: Diagnosis not present

## 2018-07-20 ENCOUNTER — Ambulatory Visit (INDEPENDENT_AMBULATORY_CARE_PROVIDER_SITE_OTHER): Payer: Medicare Other | Admitting: Internal Medicine

## 2018-07-20 ENCOUNTER — Encounter: Payer: Self-pay | Admitting: Internal Medicine

## 2018-07-20 DIAGNOSIS — M4644 Discitis, unspecified, thoracic region: Secondary | ICD-10-CM | POA: Diagnosis not present

## 2018-07-20 NOTE — Assessment & Plan Note (Signed)
He has no signs of relapsed thoracic discitis.  He is tolerating his chronic suppressive trimethoprim sulfamethoxazole.  I told him that I do not believe it is contributing to his constipation.  His BMP done recently by Dr. Felipa Eth was normal.  He prefers to continue trimethoprim sulfamethoxazole for now.  He will follow-up after lab work in 6 months.

## 2018-07-20 NOTE — Progress Notes (Signed)
[facility] [place] for Infectious Disease  Patient Active Problem List   Diagnosis Date Noted  . MRSA bacteremia     Priority: High  . Thoracic discitis     Priority: High  . Constipation 05/07/2016    Priority: Medium  . Protein-calorie malnutrition ([place]) 05/07/2016    Priority: Medium  . Ileus ([place])   . Hyperglycemia 01/30/2016  . Normocytic anemia 01/30/2016  . Thrombocytopenia ([place]) 01/30/2016  . Atherosclerotic peripheral vascular disease ([place]) 01/30/2016  . Degenerative joint disease of spine 01/30/2016  . BPH (benign prostatic hyperplasia) 01/30/2016  . Rheumatoid arthritis involving multiple sites with positive rheumatoid factor ([place]) 01/29/2016  . Essential hypertension 01/29/2016  . Hyponatremia 01/29/2016  . Left adrenal mass ([place]) 01/29/2016    Patient's Medications  New Prescriptions   No medications on file  Previous Medications   ACETAMINOPHEN (TYLENOL) 325 MG TABLET    Take 2 tablets (650 mg total) by mouth every 6 (six) hours as needed for mild pain (or Fever >/= 101).   AMLODIPINE (NORVASC) 10 MG TABLET    Take 1 tablet (10 mg total) by mouth daily.   ASPIRIN EC 81 MG TABLET    Take 81 mg by mouth daily.   CALCIUM CITRATE-VITAMIN D (CITRACAL+D) 315-200 MG-UNIT TABLET    Take 1 tablet by mouth 2 (two) times a week.   CYANOCOBALAMIN 500 MCG TABLET    Take 500 mcg by mouth daily.     GLUCOSAMINE-CHONDROITIN 500-400 MG TABLET    Take 1 tablet by mouth daily.   LISINOPRIL (PRINIVIL,ZESTRIL) 10 MG TABLET    Take 10 mg by mouth daily.     MELATONIN 3 MG TABS    Take 3 mg by mouth at bedtime.    METOPROLOL SUCCINATE (TOPROL-XL) 50 MG 24 HR TABLET    Take 1 tablet (50 mg total) by mouth daily. Take with or immediately following a meal.   MULTIPLE VITAMIN (MULTIVITAMIN) TABLET    Take 1 tablet by mouth daily.     OMEPRAZOLE (PRILOSEC) 20 MG CAPSULE    Take 1 capsule (20 mg total) by mouth daily.   SULFAMETHOXAZOLE-TRIMETHOPRIM (BACTRIM DS,SEPTRA DS) 800-160  MG TABLET    Take 1 tablet by mouth 2 (two) times daily.   TAMSULOSIN (FLOMAX) 0.4 MG CAPS CAPSULE    Take 0.4 mg by mouth daily after supper.  Modified Medications   No medications on file  Discontinued Medications   No medications on file    Subjective: Tanner Rice is in for his routine follow-up visit.  He continues to take Septra twice daily as chronic suppressive therapy for his MRSA thoracic discitis.  He has had problems with constipation and wondered if it might be related to his antibiotic.  For a short period of time he started taking only half a tablet twice daily and did not notice any change in his constipation.  He is back to taking 1 tablet twice daily.  He is not having any other problems tolerating it.  Over the last 6 months he is noted severe left groin pain when he stands for more than 30 minutes.  He thinks the pain has been coming more frequently and is more severe.  He does not recall any injury.  Review of Systems: Review of Systems  Constitutional: Negative for chills, diaphoresis and fever.  Cardiovascular: Negative for chest pain.  Gastrointestinal: Positive for constipation. Negative for abdominal pain, diarrhea, nausea and vomiting.  Genitourinary:  Left groin pain as noted in HPI.  Musculoskeletal: Negative for back pain.    Past Medical History:  Diagnosis Date  . Hypertension   . Melanoma ([place])   . Rheumatoid arthritis(714.0)   . Thoracic discitis     Social History   Tobacco Use  . Smoking status: Former Research scientist (life sciences)  . Smokeless tobacco: Never Used  Substance Use Topics  . Alcohol use: Yes    Comment: glass of wine a day   . Drug use: No    Family History  Problem Relation Age of Onset  . Prostate cancer Father   . Prostate cancer Brother   . Osteoarthritis Brother     Allergies  Allergen Reactions  . Other Diarrhea, Nausea And Vomiting and Other (See Comments)    Seafood - muscles   . Penicillins Cross Reactors Hives     Objective: Vitals:   07/20/18 0849  BP: 129/73  Pulse: 63  Temp: 98 F (36.7 C)  Weight: 155 lb (70.3 kg)   Body mass index is 25.02 kg/m.  Physical Exam Constitutional:      Comments: He is in good spirits.  Genitourinary:    Comments: No obvious inguinal hernia or other acute inflammation noted. Neurological:     Gait: Gait normal.  Psychiatric:        Mood and Affect: Mood normal.     Lab Results    Problem List Items Addressed This Visit      High   Thoracic discitis    He has no signs of relapsed thoracic discitis.  He is tolerating his chronic suppressive trimethoprim sulfamethoxazole.  I told him that I do not believe it is contributing to his constipation.  His BMP done recently by Dr. Felipa Eth was normal.  He prefers to continue trimethoprim sulfamethoxazole for now.  He will follow-up after lab work in 6 months.          Michel Bickers, MD [facility] for Infectious [place] [facility] [phone] pager   [phone] cell 07/20/2018, 9:07 AM

## 2018-07-22 ENCOUNTER — Other Ambulatory Visit: Payer: Self-pay | Admitting: Internal Medicine

## 2018-07-22 DIAGNOSIS — M4644 Discitis, unspecified, thoracic region: Secondary | ICD-10-CM

## 2018-07-30 DIAGNOSIS — H35343 Macular cyst, hole, or pseudohole, bilateral: Secondary | ICD-10-CM | POA: Diagnosis not present

## 2018-07-30 DIAGNOSIS — H43812 Vitreous degeneration, left eye: Secondary | ICD-10-CM | POA: Diagnosis not present

## 2018-07-30 DIAGNOSIS — H0100B Unspecified blepharitis left eye, upper and lower eyelids: Secondary | ICD-10-CM | POA: Diagnosis not present

## 2018-07-30 DIAGNOSIS — H0100A Unspecified blepharitis right eye, upper and lower eyelids: Secondary | ICD-10-CM | POA: Diagnosis not present

## 2018-10-06 ENCOUNTER — Ambulatory Visit (INDEPENDENT_AMBULATORY_CARE_PROVIDER_SITE_OTHER): Payer: Medicare Other | Admitting: Internal Medicine

## 2018-10-06 ENCOUNTER — Encounter: Payer: Self-pay | Admitting: Internal Medicine

## 2018-10-06 ENCOUNTER — Other Ambulatory Visit: Payer: Self-pay

## 2018-10-06 DIAGNOSIS — C61 Malignant neoplasm of prostate: Secondary | ICD-10-CM | POA: Diagnosis not present

## 2018-10-06 DIAGNOSIS — M4644 Discitis, unspecified, thoracic region: Secondary | ICD-10-CM | POA: Diagnosis not present

## 2018-10-06 MED ORDER — DOXYCYCLINE HYCLATE 100 MG PO TABS: 100.0000 mg | ORAL_TABLET | Freq: Two times a day (BID) | ORAL | 11 refills | Status: DC

## 2018-10-06 NOTE — Assessment & Plan Note (Signed)
I am skeptical that the recent nighttime pressure pain was due to relapsed discitis but have agreed to continue him on chronic suppressive antibiotics.  He prefers to stay on doxycycline for now and will follow-up in August.

## 2018-10-06 NOTE — Progress Notes (Signed)
[place] for Infectious Disease  Patient Active Problem List   Diagnosis Date Noted  . MRSA bacteremia     Priority: High  . Thoracic discitis     Priority: High  . Constipation 05/07/2016    Priority: Medium  . Protein-calorie malnutrition ([place]) 05/07/2016    Priority: Medium  . Ileus ([place])   . Hyperglycemia 01/30/2016  . Normocytic anemia 01/30/2016  . Thrombocytopenia ([place]) 01/30/2016  . Atherosclerotic peripheral vascular disease ([place]) 01/30/2016  . Degenerative joint disease of spine 01/30/2016  . BPH (benign prostatic hyperplasia) 01/30/2016  . Rheumatoid arthritis involving multiple sites with positive rheumatoid factor ([place]) 01/29/2016  . Essential hypertension 01/29/2016  . Hyponatremia 01/29/2016  . Left adrenal mass ([place]) 01/29/2016    Patient's Medications  New Prescriptions   DOXYCYCLINE (VIBRA-TABS) 100 MG TABLET    Take 1 tablet (100 mg total) by mouth 2 (two) times daily.  Previous Medications   ACETAMINOPHEN (TYLENOL) 325 MG TABLET    Take 2 tablets (650 mg total) by mouth every 6 (six) hours as needed for mild pain (or Fever >/= 101).   AMLODIPINE (NORVASC) 10 MG TABLET    Take 1 tablet (10 mg total) by mouth daily.   ASPIRIN EC 81 MG TABLET    Take 81 mg by mouth daily.   CALCIUM CITRATE-VITAMIN D (CITRACAL+D) 315-200 MG-UNIT TABLET    Take 1 tablet by mouth 2 (two) times a week.   CYANOCOBALAMIN 500 MCG TABLET    Take 500 mcg by mouth daily.     GLUCOSAMINE-CHONDROITIN 500-400 MG TABLET    Take 1 tablet by mouth daily.   LISINOPRIL (PRINIVIL,ZESTRIL) 10 MG TABLET    Take 10 mg by mouth daily.     MELATONIN 3 MG TABS    Take 3 mg by mouth at bedtime.    METOPROLOL SUCCINATE (TOPROL-XL) 50 MG 24 HR TABLET    Take 1 tablet (50 mg total) by mouth daily. Take with or immediately following a meal.   MULTIPLE VITAMIN (MULTIVITAMIN) TABLET    Take 1 tablet by mouth daily.     OMEPRAZOLE (PRILOSEC) 20 MG CAPSULE    Take 1 capsule (20 mg total) by  mouth daily.   TAMSULOSIN (FLOMAX) 0.4 MG CAPS CAPSULE    Take 0.4 mg by mouth daily after supper.  Modified Medications   No medications on file  Discontinued Medications   SULFAMETHOXAZOLE-TRIMETHOPRIM (BACTRIM DS,SEPTRA DS) 800-160 MG TABLET    Take 1 tablet by mouth 2 (two) times daily.    Subjective: Tanner Rice is seen on a work in basis today.  About 6 weeks ago his pharmacy told him he could no longer get Septra.  He asked a second time and was told the same thing.  We were never contacted by his pharmacy.  After being off of Septra for several weeks he began to notice some pressure type pain in his central chest.  It was most noticeable at night when he was in bed.  He became concerned that his MRSA discitis was recurring so he started taking an old supply of doxycycline.  He started that about 10 days ago and now the pain has gone away.  He has not had any problems tolerating his antibiotics.  Review of Systems: Review of Systems  Constitutional: Negative for fever and weight loss.  Cardiovascular: Positive for chest pain.  Gastrointestinal: Negative for abdominal pain, diarrhea, nausea and vomiting.  Musculoskeletal: Positive for back  pain.    Past Medical History:  Diagnosis Date  . Hypertension   . Melanoma ([place])   . Rheumatoid arthritis(714.0)   . Thoracic discitis     Social History   Tobacco Use  . Smoking status: Former Research scientist (life sciences)  . Smokeless tobacco: Never Used  Substance Use Topics  . Alcohol use: Yes    Comment: glass of wine a day   . Drug use: No    Family History  Problem Relation Age of Onset  . Prostate cancer Father   . Prostate cancer Brother   . Osteoarthritis Brother     Allergies  Allergen Reactions  . Other Diarrhea, Nausea And Vomiting and Other (See Comments)    Seafood - muscles   . Penicillins Cross Reactors Hives    Objective: Vitals:   10/06/18 1425  BP: (!) 176/81  Pulse: 67  Temp: 97.9 F (36.6 C)  TempSrc: Oral  SpO2: 97%   Weight: 155 lb (70.3 kg)   Body mass index is 25.02 kg/m.  Physical Exam Constitutional:      Comments: He is in good spirits.  Cardiovascular:     Rate and Rhythm: Normal rate and regular rhythm.     Heart sounds: No murmur.  Pulmonary:     Effort: Pulmonary effort is normal.     Breath sounds: Normal breath sounds.  Abdominal:     Palpations: Abdomen is soft.     Tenderness: There is no abdominal tenderness.  Neurological:     Gait: Gait normal.  Psychiatric:        Mood and Affect: Mood normal.     Lab Results    Problem List Items Addressed This Visit      High   Thoracic discitis    I am skeptical that the recent nighttime pressure pain was due to relapsed discitis but have agreed to continue him on chronic suppressive antibiotics.  He prefers to stay on doxycycline for now and will follow-up in August.      Relevant Medications   doxycycline (VIBRA-TABS) 100 MG tablet       Michel Bickers, MD [facility] for [place] [phone] pager   [phone] cell 10/06/2018, 2:57 PM

## 2018-10-20 DIAGNOSIS — N401 Enlarged prostate with lower urinary tract symptoms: Secondary | ICD-10-CM | POA: Diagnosis not present

## 2018-10-20 DIAGNOSIS — R8271 Bacteriuria: Secondary | ICD-10-CM | POA: Diagnosis not present

## 2018-10-20 DIAGNOSIS — C61 Malignant neoplasm of prostate: Secondary | ICD-10-CM | POA: Diagnosis not present

## 2018-10-20 DIAGNOSIS — R351 Nocturia: Secondary | ICD-10-CM | POA: Diagnosis not present

## 2018-11-04 DIAGNOSIS — L720 Epidermal cyst: Secondary | ICD-10-CM | POA: Diagnosis not present

## 2018-11-04 DIAGNOSIS — L570 Actinic keratosis: Secondary | ICD-10-CM | POA: Diagnosis not present

## 2018-11-04 DIAGNOSIS — Z85828 Personal history of other malignant neoplasm of skin: Secondary | ICD-10-CM | POA: Diagnosis not present

## 2018-11-04 DIAGNOSIS — D1801 Hemangioma of skin and subcutaneous tissue: Secondary | ICD-10-CM | POA: Diagnosis not present

## 2018-11-04 DIAGNOSIS — D692 Other nonthrombocytopenic purpura: Secondary | ICD-10-CM | POA: Diagnosis not present

## 2018-11-04 DIAGNOSIS — L814 Other melanin hyperpigmentation: Secondary | ICD-10-CM | POA: Diagnosis not present

## 2018-11-04 DIAGNOSIS — Z85820 Personal history of malignant melanoma of skin: Secondary | ICD-10-CM | POA: Diagnosis not present

## 2018-11-04 DIAGNOSIS — L821 Other seborrheic keratosis: Secondary | ICD-10-CM | POA: Diagnosis not present

## 2018-11-17 DIAGNOSIS — Z79899 Other long term (current) drug therapy: Secondary | ICD-10-CM | POA: Diagnosis not present

## 2018-11-17 DIAGNOSIS — M0579 Rheumatoid arthritis with rheumatoid factor of multiple sites without organ or systems involvement: Secondary | ICD-10-CM | POA: Diagnosis not present

## 2018-12-04 DIAGNOSIS — R1032 Left lower quadrant pain: Secondary | ICD-10-CM | POA: Diagnosis not present

## 2018-12-08 ENCOUNTER — Other Ambulatory Visit: Payer: Self-pay | Admitting: General Surgery

## 2018-12-08 DIAGNOSIS — R1032 Left lower quadrant pain: Secondary | ICD-10-CM

## 2018-12-22 ENCOUNTER — Other Ambulatory Visit: Payer: Self-pay

## 2018-12-22 ENCOUNTER — Ambulatory Visit
Admission: RE | Admit: 2018-12-22 | Discharge: 2018-12-22 | Disposition: A | Discharge status: Home / Self Care | Payer: Medicare Other | Source: Ambulatory Visit | Attending: General Surgery | Admitting: General Surgery

## 2018-12-22 ENCOUNTER — Other Ambulatory Visit: Payer: Medicare Other

## 2018-12-22 DIAGNOSIS — N400 Enlarged prostate without lower urinary tract symptoms: Secondary | ICD-10-CM | POA: Diagnosis not present

## 2018-12-22 DIAGNOSIS — D3502 Benign neoplasm of left adrenal gland: Secondary | ICD-10-CM | POA: Diagnosis not present

## 2018-12-22 DIAGNOSIS — R1032 Left lower quadrant pain: Secondary | ICD-10-CM

## 2018-12-22 DIAGNOSIS — K4090 Unilateral inguinal hernia, without obstruction or gangrene, not specified as recurrent: Secondary | ICD-10-CM | POA: Diagnosis not present

## 2018-12-22 MED ORDER — IOPAMIDOL (ISOVUE-300) INJECTION 61%
100.0000 mL | Freq: Once | INTRAVENOUS | Status: AC | PRN
  Administered 2018-12-22: 100 mL via INTRAVENOUS

## 2018-12-25 DIAGNOSIS — K4090 Unilateral inguinal hernia, without obstruction or gangrene, not specified as recurrent: Secondary | ICD-10-CM | POA: Diagnosis not present

## 2019-01-01 ENCOUNTER — Other Ambulatory Visit: Payer: Self-pay | Admitting: General Surgery

## 2019-01-19 ENCOUNTER — Encounter: Payer: Self-pay | Admitting: Internal Medicine

## 2019-01-19 ENCOUNTER — Ambulatory Visit (INDEPENDENT_AMBULATORY_CARE_PROVIDER_SITE_OTHER): Payer: Medicare Other | Admitting: Internal Medicine

## 2019-01-19 ENCOUNTER — Other Ambulatory Visit: Payer: Self-pay

## 2019-01-19 DIAGNOSIS — M4644 Discitis, unspecified, thoracic region: Secondary | ICD-10-CM | POA: Diagnosis not present

## 2019-01-19 NOTE — Assessment & Plan Note (Signed)
He prefers to stay on doxycycline rather than stop it and take a chance that his discitis will relapse again.  He will follow-up in 6 months.

## 2019-01-19 NOTE — Progress Notes (Signed)
[facility]ious [facility]  Patient Active Problem List   Diagnosis Date Noted  . MRSA bacteremia     Priority: High  . Thoracic discitis     Priority: High  . Constipation 05/07/2016    Priority: Medium  . Protein-calorie malnutrition ([place]) 05/07/2016    Priority: Medium  . Ileus ([place])   . Hyperglycemia 01/30/2016  . Normocytic anemia 01/30/2016  . Thrombocytopenia ([place]) 01/30/2016  . Atherosclerotic peripheral vascular disease ([place]) 01/30/2016  . Degenerative joint disease of spine 01/30/2016  . BPH (benign prostatic hyperplasia) 01/30/2016  . Rheumatoid arthritis involving multiple sites with positive rheumatoid factor ([place]) 01/29/2016  . Essential hypertension 01/29/2016  . Hyponatremia 01/29/2016  . Left adrenal mass ([place]) 01/29/2016    Patient's Medications  New Prescriptions   No medications on file  Previous Medications   ACETAMINOPHEN (TYLENOL) 325 MG TABLET    Take 2 tablets (650 mg total) by mouth every 6 (six) hours as needed for mild pain (or Fever >/= 101).   AMLODIPINE (NORVASC) 10 MG TABLET    Take 1 tablet (10 mg total) by mouth daily.   ASPIRIN EC 81 MG TABLET    Take 81 mg by mouth daily.   CALCIUM CITRATE-VITAMIN D (CITRACAL+D) 315-200 MG-UNIT TABLET    Take 1 tablet by mouth 2 (two) times a week.   CYANOCOBALAMIN 500 MCG TABLET    Take 500 mcg by mouth daily.     DOXYCYCLINE (VIBRA-TABS) 100 MG TABLET    Take 1 tablet (100 mg total) by mouth 2 (two) times daily.   GLUCOSAMINE-CHONDROITIN 500-400 MG TABLET    Take 1 tablet by mouth daily.   LISINOPRIL (PRINIVIL,ZESTRIL) 10 MG TABLET    Take 10 mg by mouth daily.     MELATONIN 3 MG TABS    Take 3 mg by mouth at bedtime.    METOPROLOL SUCCINATE (TOPROL-XL) 50 MG 24 HR TABLET    Take 1 tablet (50 mg total) by mouth daily. Take with or immediately following a meal.   MULTIPLE VITAMIN (MULTIVITAMIN) TABLET    Take 1 tablet by mouth daily.     OMEPRAZOLE (PRILOSEC) 20 MG CAPSULE    Take  1 capsule (20 mg total) by mouth daily.   TAMSULOSIN (FLOMAX) 0.4 MG CAPS CAPSULE    Take 0.4 mg by mouth daily after supper.  Modified Medications   No medications on file  Discontinued Medications   No medications on file    Subjective: Tanner Rice is in for his routine follow-up visit.  He remains on chronic oral doxycycline as suppressive therapy for relapsed MRSA thoracic discitis.  He has occasional mid back pain if he has been driving in his car for a long period of time but otherwise doing well.  He has not had any problems tolerating his doxycycline.  Review of Systems: Review of Systems  Constitutional: Negative for fever.  Gastrointestinal: Positive for constipation. Negative for abdominal pain, diarrhea, nausea and vomiting.  Musculoskeletal: Positive for back pain.    Past Medical History:  Diagnosis Date  . Hypertension   . Melanoma ([place])   . Rheumatoid arthritis(714.0)   . Thoracic discitis     Social History   Tobacco Use  . Smoking status: Former Research scientist (life sciences)  . Smokeless tobacco: Never Used  Substance Use Topics  . Alcohol use: Yes    Comment: glass of wine a day   . Drug use: No    Family  History  Problem Relation Age of Onset  . Prostate cancer Father   . Prostate cancer Brother   . Osteoarthritis Brother     Allergies  Allergen Reactions  . Other Diarrhea, Nausea And Vomiting and Other (See Comments)    Seafood - muscles   . Penicillins Cross Reactors Hives    Objective: Vitals:   01/19/19 0846  BP: (!) 149/85  Pulse: 71  Temp: 98.2 F (36.8 C)  Weight: 152 lb (68.9 kg)   Body mass index is 24.53 kg/m.  Physical Exam Constitutional:      Comments: He is in good spirits.   Musculoskeletal:     Comments: No spinal deformity.     Lab Results    Problem List Items Addressed This Visit      High   Thoracic discitis    He prefers to stay on doxycycline rather than stop it and take a chance that his discitis will relapse again.  He will  follow-up in 6 months.          Michel Bickers, MD [facility] [phone] pager   [phone] cell 01/19/2019, 9:16 AM

## 2019-01-20 DIAGNOSIS — Z79899 Other long term (current) drug therapy: Secondary | ICD-10-CM | POA: Diagnosis not present

## 2019-01-20 DIAGNOSIS — I10 Essential (primary) hypertension: Secondary | ICD-10-CM | POA: Diagnosis not present

## 2019-01-20 DIAGNOSIS — Z1389 Encounter for screening for other disorder: Secondary | ICD-10-CM | POA: Diagnosis not present

## 2019-01-20 DIAGNOSIS — M069 Rheumatoid arthritis, unspecified: Secondary | ICD-10-CM | POA: Diagnosis not present

## 2019-02-02 NOTE — Pre-Procedure Instructions (Signed)
   MATRIX SCHWIND  02/02/2019    [facility] - [place], [place] - [address] [place] [address] Phone: [phone] Fax: [phone]   Your procedure is scheduled on Tuesday, February 09, 2019.  Report to [facility] Admitting at 5:30 A.M.  Call this number if you have problems the morning of surgery:  [phone]   Remember: Brush your teeth the morning of surgery with your regular toothpaste.  Do not eat after midnight Monday February 08, 2019  You may drink clear liquids until 4:30A.M .  Clear liquids allowed are:  Water, Juice (non-citric and without pulp), Carbonated beverages, Clear Tea, Black Coffee only, Plain Jell-O only, Gatorade and Plain Popsicles only      Finish your Ensure Pre-Surgery carbohydrate drink by 4:30 A.M ( Do not sip)   Take these medicines the morning of surgery with A SIP OF WATER :  amLODipine (NORVASC),  Stop taking Aspirin (unless otherwise advised by surgeon), vitamins, fish oil, Melatonin and herbal medications. Do not take any NSAIDs ie: Ibuprofen, Advil, Naproxen (Aleve), Motrin, BC and Goody Powder; stop now.   Do not wear jewelry, make-up or nail polish.  Do not wear lotions, powders, or perfumes, or deodorant.  Do not shave 48 hours prior to surgery.  Men may shave face and neck.  Do not bring valuables to the hospital.  [facility] is not responsible for any belongings or valuables.  Contacts, dentures or bridgework may not be worn into surgery.  Leave your suitcase in the car.  After surgery it may be brought to your room.  Patients discharged the day of surgery will not be allowed to drive home.  Special instructions:  Showwer the night before and morning of surgery with CHG; see the " [facility] Preparing For Surgery " sheet. Please read over the following fact sheets that you were given. Pain Booklet, Coughing and Deep Breathing and Surgical Site Infection Prevention

## 2019-02-03 ENCOUNTER — Encounter (HOSPITAL_COMMUNITY)
Admission: RE | Admit: 2019-02-03 | Discharge: 2019-02-03 | Disposition: A | Discharge status: Home / Self Care | Payer: Medicare Other | Source: Ambulatory Visit | Attending: General Surgery | Admitting: General Surgery

## 2019-02-03 ENCOUNTER — Encounter (HOSPITAL_COMMUNITY): Payer: Self-pay

## 2019-02-03 ENCOUNTER — Other Ambulatory Visit: Payer: Self-pay

## 2019-02-03 DIAGNOSIS — Z01818 Encounter for other preprocedural examination: Secondary | ICD-10-CM | POA: Diagnosis not present

## 2019-02-03 DIAGNOSIS — Z20828 Contact with and (suspected) exposure to other viral communicable diseases: Secondary | ICD-10-CM | POA: Insufficient documentation

## 2019-02-03 LAB — BASIC METABOLIC PANEL
Anion gap: 11 (ref 5–15)
BUN: 15 mg/dL (ref 8–23)
CO2: 26 mmol/L (ref 22–32)
Calcium: 10.1 mg/dL (ref 8.9–10.3)
Chloride: 100 mmol/L (ref 98–111)
Creatinine, Ser: 0.76 mg/dL (ref 0.61–1.24)
GFR calc Af Amer: >60 mL/min (ref >60)
GFR calc non Af Amer: >60 mL/min (ref >60)
Glucose, Bld: 121 mg/dL — ABNORMAL HIGH (ref 70–99)
Potassium: 3.9 mmol/L (ref 3.5–5.1)
Sodium: 137 mmol/L (ref 135–145)

## 2019-02-03 LAB — CBC
HCT: 40.8 % (ref 39.0–52.0)
Hemoglobin: 13.1 g/dL (ref 13.0–17.0)
MCH: 30.9 pg (ref 26.0–34.0)
MCHC: 32.1 g/dL (ref 30.0–36.0)
MCV: 96.2 fL (ref 80.0–100.0)
Platelets: 267 10*3/uL (ref 150–400)
RBC: 4.24 MIL/uL (ref 4.22–5.81)
RDW: 13.2 % (ref 11.5–15.5)
WBC: 6.9 10*3/uL (ref 4.0–10.5)
nRBC: 0 % (ref 0.0–0.2)

## 2019-02-03 NOTE — Progress Notes (Addendum)
PCP - Hal Stoneking Cardiologist - none, saw one back in 2017 here at [facility] admission for bacteremia Infectious disease : Michel Bickers Chest x-ray - na EKG - today Stress Test  ECHO - 2017 Cardiac Cath - na  Sleep Study - na CPAP -   Fasting Blood Sugar - na Checks Blood Sugar _____ times a day  Blood Thinner Instructions:na Aspirin Instructions:na  Anesthesia review:   Patient denies shortness of breath, fever, cough and chest pain at PAT appointment   Patient verbalized understanding of instructions that were given to them at the PAT appointment. Patient was also instructed that they will need to review over the PAT instructions again at home before surgery.

## 2019-02-04 NOTE — Anesthesia Preprocedure Evaluation (Addendum)
Anesthesia Evaluation  Patient identified by MRN, date of birth, ID band Patient awake    Reviewed: Allergy & Precautions, H&P , NPO status , Patient's Chart, lab work & pertinent test results, reviewed documented beta blocker date and time   Airway Mallampati: II  TM Distance: >3 FB Neck ROM: Full    Dental no notable dental hx. (+) Edentulous Upper, Edentulous Lower, Dental Advisory Given   Pulmonary neg pulmonary ROS, former smoker,    Pulmonary exam normal breath sounds clear to auscultation       Cardiovascular Exercise Tolerance: Good hypertension, Pt. on medications and Pt. on home beta blockers + dysrhythmias  Rhythm:Regular Rate:Bradycardia     Neuro/Psych negative neurological ROS  negative psych ROS   GI/Hepatic negative GI ROS, Neg liver ROS,   Endo/Other  negative endocrine ROS  Renal/GU negative Renal ROS  negative genitourinary   Musculoskeletal  (+) Arthritis , Rheumatoid disorders,    Abdominal   Peds  Hematology  (+) Blood dyscrasia, anemia ,   Anesthesia Other Findings   Reproductive/Obstetrics negative OB ROS                           Anesthesia Physical Anesthesia Plan  ASA: II  Anesthesia Plan: General   Post-op Pain Management:  Regional for Post-op pain   Induction: Intravenous  PONV Risk Score and Plan: 3 and Ondansetron, Dexamethasone and Treatment may vary due to age or medical condition  Airway Management Planned: Oral ETT  Additional Equipment:   Intra-op Plan:   Post-operative Plan: Extubation in OR  Informed Consent: I have reviewed the patients History and Physical, chart, labs and discussed the procedure including the risks, benefits and alternatives for the proposed anesthesia with the patient or authorized representative who has indicated his/her understanding and acceptance.     Dental advisory given  Plan Discussed with:  CRNA  Anesthesia Plan Comments: (Hx of MRSA bacteremia complicated by thoracic spine infection in 2017. Continues to follow with ID. He continues to take Bactrim for chronic suppressive therapy for his MRSA thoracic discitis.  EKG 02/03/19 shows new ectopic atrial rhythm, low atrial with inverted p waves in inferior leads. Discussed with Dr. Gifford Shave. No further workup necessary prior to surgery as long as pt does not develop any new cv symptoms.  TEE 01/31/16 (during admission for bacteremia): Study Conclusions  - Left ventricle: Systolic function was normal. The estimated   ejection fraction was in the range of 55% to 60%. Wall motion was   normal; there were no regional wall motion abnormalities. - Aortic valve: There was trivial regurgitation. - Mitral valve: There was mild regurgitation. - Left atrium: No evidence of thrombus in the atrial cavity or   appendage. - Right atrium: No evidence of thrombus in the atrial cavity or   appendage. - Pericardium, extracardiac: There was a left pleural effusion.)       Anesthesia Quick Evaluation

## 2019-02-05 ENCOUNTER — Other Ambulatory Visit (HOSPITAL_COMMUNITY)
Admission: RE | Admit: 2019-02-05 | Discharge: 2019-02-05 | Disposition: A | Discharge status: Home / Self Care | Payer: Medicare Other | Source: Ambulatory Visit | Attending: General Surgery | Admitting: General Surgery

## 2019-02-05 DIAGNOSIS — Z01818 Encounter for other preprocedural examination: Secondary | ICD-10-CM | POA: Diagnosis not present

## 2019-02-05 DIAGNOSIS — Z20828 Contact with and (suspected) exposure to other viral communicable diseases: Secondary | ICD-10-CM | POA: Diagnosis not present

## 2019-02-05 LAB — SARS CORONAVIRUS 2 (TAT 6-24 HRS): SARS Coronavirus 2: NEGATIVE

## 2019-02-09 ENCOUNTER — Ambulatory Visit (HOSPITAL_COMMUNITY): Payer: Medicare Other | Admitting: Physician Assistant

## 2019-02-09 ENCOUNTER — Encounter (HOSPITAL_COMMUNITY)
Admission: RE | Disposition: A | Discharge status: Home / Self Care | Payer: Self-pay | Source: Ambulatory Visit | Attending: General Surgery

## 2019-02-09 ENCOUNTER — Ambulatory Visit (HOSPITAL_COMMUNITY)
Admission: RE | Admit: 2019-02-09 | Discharge: 2019-02-09 | Disposition: A | Discharge status: Home / Self Care | Payer: Medicare Other | Source: Ambulatory Visit | Attending: General Surgery | Admitting: General Surgery

## 2019-02-09 ENCOUNTER — Other Ambulatory Visit: Payer: Self-pay

## 2019-02-09 ENCOUNTER — Ambulatory Visit (HOSPITAL_COMMUNITY): Payer: Medicare Other | Admitting: Anesthesiology

## 2019-02-09 DIAGNOSIS — Z9841 Cataract extraction status, right eye: Secondary | ICD-10-CM | POA: Diagnosis not present

## 2019-02-09 DIAGNOSIS — Z79899 Other long term (current) drug therapy: Secondary | ICD-10-CM | POA: Insufficient documentation

## 2019-02-09 DIAGNOSIS — Z87891 Personal history of nicotine dependence: Secondary | ICD-10-CM | POA: Diagnosis not present

## 2019-02-09 DIAGNOSIS — Z85820 Personal history of malignant melanoma of skin: Secondary | ICD-10-CM | POA: Insufficient documentation

## 2019-02-09 DIAGNOSIS — D649 Anemia, unspecified: Secondary | ICD-10-CM | POA: Diagnosis not present

## 2019-02-09 DIAGNOSIS — Z91013 Allergy to seafood: Secondary | ICD-10-CM | POA: Insufficient documentation

## 2019-02-09 DIAGNOSIS — N400 Enlarged prostate without lower urinary tract symptoms: Secondary | ICD-10-CM | POA: Diagnosis not present

## 2019-02-09 DIAGNOSIS — Z8261 Family history of arthritis: Secondary | ICD-10-CM | POA: Diagnosis not present

## 2019-02-09 DIAGNOSIS — R001 Bradycardia, unspecified: Secondary | ICD-10-CM | POA: Insufficient documentation

## 2019-02-09 DIAGNOSIS — K4090 Unilateral inguinal hernia, without obstruction or gangrene, not specified as recurrent: Secondary | ICD-10-CM | POA: Diagnosis not present

## 2019-02-09 DIAGNOSIS — I10 Essential (primary) hypertension: Secondary | ICD-10-CM | POA: Diagnosis not present

## 2019-02-09 DIAGNOSIS — D176 Benign lipomatous neoplasm of spermatic cord: Secondary | ICD-10-CM | POA: Insufficient documentation

## 2019-02-09 DIAGNOSIS — Z880 Allergy status to penicillin: Secondary | ICD-10-CM | POA: Diagnosis not present

## 2019-02-09 DIAGNOSIS — Z8042 Family history of malignant neoplasm of prostate: Secondary | ICD-10-CM | POA: Insufficient documentation

## 2019-02-09 DIAGNOSIS — M069 Rheumatoid arthritis, unspecified: Secondary | ICD-10-CM | POA: Insufficient documentation

## 2019-02-09 DIAGNOSIS — Z9842 Cataract extraction status, left eye: Secondary | ICD-10-CM | POA: Diagnosis not present

## 2019-02-09 DIAGNOSIS — G8918 Other acute postprocedural pain: Secondary | ICD-10-CM | POA: Diagnosis not present

## 2019-02-09 DIAGNOSIS — M4644 Discitis, unspecified, thoracic region: Secondary | ICD-10-CM | POA: Insufficient documentation

## 2019-02-09 HISTORY — PX: INGUINAL HERNIA REPAIR: SHX194

## 2019-02-09 SURGERY — REPAIR, HERNIA, INGUINAL, ADULT
Anesthesia: General | Site: Inguinal | Laterality: Left

## 2019-02-09 MED ORDER — PROPOFOL 10 MG/ML IV BOLUS
INTRAVENOUS | Status: DC | PRN
  Administered 2019-02-09: 100 mg via INTRAVENOUS

## 2019-02-09 MED ORDER — ONDANSETRON HCL 4 MG/2ML IJ SOLN
INTRAMUSCULAR | Status: DC | PRN
  Administered 2019-02-09: 4 mg via INTRAVENOUS

## 2019-02-09 MED ORDER — DEXAMETHASONE SODIUM PHOSPHATE 10 MG/ML IJ SOLN
INTRAMUSCULAR | Status: DC | PRN
  Administered 2019-02-09: 5 mg via INTRAVENOUS

## 2019-02-09 MED ORDER — BUPIVACAINE LIPOSOME 1.3 % IJ SUSP
INTRAMUSCULAR | Status: DC | PRN
  Administered 2019-02-09: 10 mL

## 2019-02-09 MED ORDER — BUPIVACAINE HCL (PF) 0.25 % IJ SOLN
INTRAMUSCULAR | Status: DC | PRN
  Administered 2019-02-09: 7 mL

## 2019-02-09 MED ORDER — STERILE WATER FOR IRRIGATION IR SOLN
Status: DC | PRN
  Administered 2019-02-09: 1000 mL

## 2019-02-09 MED ORDER — ENSURE PRE-SURGERY PO LIQD: 296.0000 mL | Freq: Once | ORAL | Status: DC

## 2019-02-09 MED ORDER — SODIUM CHLORIDE 0.9% FLUSH: 3.0000 mL | INTRAVENOUS | Status: DC | PRN

## 2019-02-09 MED ORDER — BUPIVACAINE-EPINEPHRINE (PF) 0.5% -1:200000 IJ SOLN
INTRAMUSCULAR | Status: DC | PRN
  Administered 2019-02-09: 20 mL

## 2019-02-09 MED ORDER — ACETAMINOPHEN 500 MG PO TABS
ORAL_TABLET | ORAL | Status: AC
  Administered 2019-02-09: 1000 mg via ORAL
  Filled 2019-02-09: qty 2

## 2019-02-09 MED ORDER — FENTANYL CITRATE (PF) 250 MCG/5ML IJ SOLN
INTRAMUSCULAR | Status: DC | PRN
  Administered 2019-02-09 (×2): 50 ug via INTRAVENOUS

## 2019-02-09 MED ORDER — MIDAZOLAM HCL 2 MG/2ML IJ SOLN
INTRAMUSCULAR | Status: DC | PRN
  Administered 2019-02-09: 1 mg via INTRAVENOUS

## 2019-02-09 MED ORDER — ROCURONIUM BROMIDE 10 MG/ML (PF) SYRINGE
PREFILLED_SYRINGE | INTRAVENOUS | Status: DC | PRN
  Administered 2019-02-09: 40 mg via INTRAVENOUS
  Administered 2019-02-09: 10 mg via INTRAVENOUS

## 2019-02-09 MED ORDER — SODIUM CHLORIDE 0.9 % IV SOLN: 250.0000 mL | INTRAVENOUS | Status: DC | PRN

## 2019-02-09 MED ORDER — PROPOFOL 10 MG/ML IV BOLUS
INTRAVENOUS | Status: AC
  Filled 2019-02-09: qty 20

## 2019-02-09 MED ORDER — BUPIVACAINE HCL (PF) 0.25 % IJ SOLN
INTRAMUSCULAR | Status: AC
  Filled 2019-02-09: qty 30

## 2019-02-09 MED ORDER — TRAMADOL HCL 50 MG PO TABS: 50.0000 mg | ORAL_TABLET | Freq: Four times a day (QID) | ORAL | 0 refills | Status: DC | PRN

## 2019-02-09 MED ORDER — VANCOMYCIN HCL IN DEXTROSE 1-5 GM/200ML-% IV SOLN
1000.0000 mg | INTRAVENOUS | Status: AC
  Administered 2019-02-09: 07:00:00 1000 mg via INTRAVENOUS

## 2019-02-09 MED ORDER — VANCOMYCIN HCL IN DEXTROSE 1-5 GM/200ML-% IV SOLN
INTRAVENOUS | Status: AC
  Administered 2019-02-09: 1000 mg via INTRAVENOUS
  Filled 2019-02-09: qty 200

## 2019-02-09 MED ORDER — GABAPENTIN 100 MG PO CAPS
100.0000 mg | ORAL_CAPSULE | ORAL | Status: AC
  Administered 2019-02-09: 07:00:00 100 mg via ORAL

## 2019-02-09 MED ORDER — 0.9 % SODIUM CHLORIDE (POUR BTL) OPTIME
TOPICAL | Status: DC | PRN
  Administered 2019-02-09: 1000 mL

## 2019-02-09 MED ORDER — FENTANYL CITRATE (PF) 250 MCG/5ML IJ SOLN
INTRAMUSCULAR | Status: AC
  Filled 2019-02-09: qty 5

## 2019-02-09 MED ORDER — LACTATED RINGERS IV SOLN
INTRAVENOUS | Status: DC | PRN
  Administered 2019-02-09: 07:00:00 via INTRAVENOUS

## 2019-02-09 MED ORDER — MIDAZOLAM HCL 2 MG/2ML IJ SOLN
INTRAMUSCULAR | Status: AC
  Filled 2019-02-09: qty 2

## 2019-02-09 MED ORDER — LIDOCAINE 2% (20 MG/ML) 5 ML SYRINGE
INTRAMUSCULAR | Status: DC | PRN
  Administered 2019-02-09: 40 mg via INTRAVENOUS

## 2019-02-09 MED ORDER — ACETAMINOPHEN 325 MG PO TABS
650.0000 mg | ORAL_TABLET | ORAL | Status: DC | PRN
  Filled 2019-02-09: qty 2

## 2019-02-09 MED ORDER — ACETAMINOPHEN 500 MG PO TABS
1000.0000 mg | ORAL_TABLET | ORAL | Status: AC
  Administered 2019-02-09: 07:00:00 1000 mg via ORAL

## 2019-02-09 MED ORDER — FENTANYL CITRATE (PF) 100 MCG/2ML IJ SOLN: 25.0000 ug | INTRAMUSCULAR | Status: DC | PRN

## 2019-02-09 MED ORDER — EPHEDRINE SULFATE 50 MG/ML IJ SOLN
INTRAMUSCULAR | Status: DC | PRN
  Administered 2019-02-09: 10 mg via INTRAVENOUS

## 2019-02-09 MED ORDER — SUGAMMADEX SODIUM 200 MG/2ML IV SOLN
INTRAVENOUS | Status: DC | PRN
  Administered 2019-02-09: 200 mg via INTRAVENOUS

## 2019-02-09 MED ORDER — SUCCINYLCHOLINE CHLORIDE 200 MG/10ML IV SOSY
PREFILLED_SYRINGE | INTRAVENOUS | Status: DC | PRN
  Administered 2019-02-09: 80 mg via INTRAVENOUS

## 2019-02-09 MED ORDER — ACETAMINOPHEN 650 MG RE SUPP
650.0000 mg | RECTAL | Status: DC | PRN
  Filled 2019-02-09: qty 1

## 2019-02-09 MED ORDER — GABAPENTIN 300 MG PO CAPS
ORAL_CAPSULE | ORAL | Status: AC
  Administered 2019-02-09: 100 mg via ORAL
  Filled 2019-02-09: qty 1

## 2019-02-09 SURGICAL SUPPLY — 41 items
BLADE CLIPPER SURG (BLADE) ×3 IMPLANT
CANISTER SUCT 3000ML PPV (MISCELLANEOUS) ×3 IMPLANT
CHLORAPREP W/TINT 26 (MISCELLANEOUS) ×3 IMPLANT
CLOSURE WOUND 1/2 X4 (GAUZE/BANDAGES/DRESSINGS) ×1
COVER SURGICAL LIGHT HANDLE (MISCELLANEOUS) ×3 IMPLANT
COVER WAND RF STERILE (DRAPES) ×3 IMPLANT
DECANTER SPIKE VIAL GLASS SM (MISCELLANEOUS) ×3 IMPLANT
DERMABOND ADVANCED (GAUZE/BANDAGES/DRESSINGS) ×2
DERMABOND ADVANCED .7 DNX12 (GAUZE/BANDAGES/DRESSINGS) ×1 IMPLANT
DRAIN PENROSE 1/2X12 LTX STRL (WOUND CARE) ×3 IMPLANT
DRAPE LAPAROTOMY TRNSV 102X78 (DRAPES) ×3 IMPLANT
ELECT CAUTERY BLADE 6.4 (BLADE) ×3 IMPLANT
ELECT REM PT RETURN 9FT ADLT (ELECTROSURGICAL) ×3
ELECTRODE REM PT RTRN 9FT ADLT (ELECTROSURGICAL) ×1 IMPLANT
GAUZE 4X4 16PLY RFD (DISPOSABLE) ×3 IMPLANT
GLOVE BIO SURGEON STRL SZ7 (GLOVE) ×3 IMPLANT
GLOVE BIOGEL PI IND STRL 7.5 (GLOVE) ×1 IMPLANT
GLOVE BIOGEL PI INDICATOR 7.5 (GLOVE) ×2
GOWN STRL REUS W/ TWL LRG LVL3 (GOWN DISPOSABLE) ×2 IMPLANT
GOWN STRL REUS W/TWL LRG LVL3 (GOWN DISPOSABLE) ×4
KIT BASIN OR (CUSTOM PROCEDURE TRAY) ×3 IMPLANT
KIT TURNOVER KIT B (KITS) ×3 IMPLANT
MARKER SKIN DUAL TIP RULER LAB (MISCELLANEOUS) ×3 IMPLANT
MESH ULTRAPRO 3X6 7.6X15CM (Mesh General) ×3 IMPLANT
NEEDLE HYPO 25GX1X1/2 BEV (NEEDLE) ×3 IMPLANT
NS IRRIG 1000ML POUR BTL (IV SOLUTION) ×3 IMPLANT
PACK GENERAL/GYN (CUSTOM PROCEDURE TRAY) ×3 IMPLANT
PAD ARMBOARD 7.5X6 YLW CONV (MISCELLANEOUS) ×3 IMPLANT
PENCIL SMOKE EVACUATOR (MISCELLANEOUS) ×3 IMPLANT
STRIP CLOSURE SKIN 1/2X4 (GAUZE/BANDAGES/DRESSINGS) ×2 IMPLANT
SUT MNCRL AB 4-0 PS2 18 (SUTURE) ×3 IMPLANT
SUT VIC AB 2-0 CT1 27 (SUTURE) ×2
SUT VIC AB 2-0 CT1 TAPERPNT 27 (SUTURE) ×1 IMPLANT
SUT VIC AB 2-0 SH 18 (SUTURE) ×3 IMPLANT
SUT VIC AB 3-0 SH 27 (SUTURE) ×2
SUT VIC AB 3-0 SH 27XBRD (SUTURE) ×1 IMPLANT
SUT VICRYL AB 2 0 TIES (SUTURE) ×3 IMPLANT
SYR CONTROL 10ML LL (SYRINGE) ×3 IMPLANT
TOWEL GREEN STERILE (TOWEL DISPOSABLE) ×3 IMPLANT
TOWEL GREEN STERILE FF (TOWEL DISPOSABLE) ×3 IMPLANT
WATER STERILE IRR 1000ML POUR (IV SOLUTION) ×3 IMPLANT

## 2019-02-09 NOTE — Op Note (Signed)
Preoperative diagnosis: Left inguinal hernia  Postoperative diagnosis: indirect left inguinal hernia Procedure: Left inguinal hernia repair with Ultrapro mesh patch Surgeon: Dr Serita Grammes Anesthesia: general with TAP block EBL: minimal Complications none Drains none Specimens none Sponge and needle count correct at completion dispo to recovery stable  Indications: This is a 39 yom who has a symptomatic left inguinal hernia. He desires repair and we discussed open repair with mesh.   Procedure: After informed consent was obtained patient was taken to the operating room. He had undergone a TAP block.  He was given vancomycin due to prior mrsa infection and SCDs were in place.  He was placed under general anesthesia without complication. He was prepped and draped in standard sterile surgical fashion.  I infiltrated marcaine in the left groin.  I made an incision. I cauterized the diminutive superficial epigastric vein. I then entered the external oblique through the external ring.  I encircled the spermatic cord with a penrose drain. He had a large chronic indirect hernia. There was no direct hernia but the floor was weak. I excised a small cord lipoma and then reduced the hernia in its entirety.  I then closed the internal ring with 2-0 vicryl suture.   I then fashioned an ultrapro patch to fit the area. I sutured this to the pubic tubercle with 2-0 vicryl and then to the shelving edge inferiorly.  I made a cut and wrapped around spermatic cord. I then sutured the cut ends together and secured the top portion of the mesh. I laid the lateral ends under the external oblique. The mesh was in good position and completely obliterated the defect.  Hemostasis was observed. I closed the external oblique with 2-0 vicryl and Scarpas fascia with 3-0 vicryl. The skin was closed with 4-0 monocryl and dermabond. He tolerated this well, was extubated and transferred to recovery stable.

## 2019-02-09 NOTE — Anesthesia Postprocedure Evaluation (Signed)
Anesthesia Post Note  Patient: CAREEM GREW  Procedure(s) Performed: LEFT INGUINAL HERNIA REPAIR WITH MESH (Left Inguinal)     Patient location during evaluation: PACU Anesthesia Type: General and Regional Level of consciousness: awake and alert Pain management: pain level controlled Vital Signs Assessment: post-procedure vital signs reviewed and stable Respiratory status: spontaneous breathing, nonlabored ventilation and respiratory function stable Cardiovascular status: blood pressure returned to baseline and stable Postop Assessment: no apparent nausea or vomiting Anesthetic complications: no    Last Vitals:  Vitals:   02/09/19 0915 02/09/19 0917  BP: (!) 164/85   Pulse: (!) 59 (!) 58  Resp:    Temp:    SpO2:  100%    Last Pain:  Vitals:   02/09/19 0900  TempSrc:   PainSc: 0-No pain                 Naveh Rickles,W. EDMOND

## 2019-02-09 NOTE — Anesthesia Procedure Notes (Signed)
Procedure Name: Intubation Date/Time: 02/09/2019 7:33 AM Performed by: Elayne Snare, CRNA Pre-anesthesia Checklist: Patient identified, Emergency Drugs available, Suction available and Patient being monitored Patient Re-evaluated:Patient Re-evaluated prior to induction Oxygen Delivery Method: Circle System Utilized Preoxygenation: Pre-oxygenation with 100% oxygen Induction Type: IV induction and Rapid sequence Ventilation: Mask ventilation without difficulty Laryngoscope Size: Mac and 4 Grade View: Grade I Tube type: Oral Tube size: 7.5 mm Number of attempts: 1 Airway Equipment and Method: Stylet and Oral airway Placement Confirmation: ETT inserted through vocal cords under direct vision,  positive ETCO2 and breath sounds checked- equal and bilateral Secured at: 21 cm Tube secured with: Tape Dental Injury: Teeth and Oropharynx as per pre-operative assessment

## 2019-02-09 NOTE — H&P (Signed)
Tanner Rice is an 85 y.o. male.   Chief Complaint: left groin pain  HPI:  24 yom with RA and on doxy for chronic mrsa infection presents with some months of left groin pain. he has prior csc. this comes and goes, is associated with activity. he does think he notes a bulge present when he has pain. no issues voiding. he chopped wood yesterday and had soreness. I sent for ct scan that confirmed inguinal hernia and he returns to discuss repair   Past Medical History:  Diagnosis Date  . Hypertension   . Melanoma ([place])    upper thigh on right leg  . Rheumatoid arthritis(714.0)   . Thoracic discitis     Past Surgical History:  Procedure Laterality Date  . CATARACT EXTRACTION, BILATERAL    . COLONOSCOPY    . hole in retina surgery    . IR GENERIC HISTORICAL  04/24/2016   IR US GUIDE VASC ACCESS RIGHT 04/24/2016 Arne Cleveland, MD [facility]INTER[facility] RAD  . IR GENERIC HISTORICAL  04/24/2016   IR FLUORO GUIDE CV LINE RIGHT 04/24/2016 Arne Cleveland, MD [facility]  . TEE WITHOUT CARDIOVERSION N/A 02/02/2016   Procedure: TRANSESOPHAGEAL ECHOCARDIOGRAM (TEE);  Surgeon: Jerline Pain, MD;  Location: [facility] ENDOSCOPY;  Service: Cardiovascular;  Laterality: N/A;  . TONSILLECTOMY      Family History  Problem Relation Age of Onset  . Prostate cancer Father   . Prostate cancer Brother   . Osteoarthritis Brother    Social History:  reports that he has quit smoking. His smoking use included pipe. He quit after 25.00 years of use. He has never used smokeless tobacco. He reports current alcohol use. He reports that he does not use drugs.  Allergies:  Allergies  Allergen Reactions  . Other Diarrhea, Nausea And Vomiting and Other (See Comments)    Seafood - mussels   . Penicillins Cross Reactors Hives    Did it involve swelling of the face/tongue/throat, SOB, or low BP? Unknown Did it involve sudden or severe rash/hives, skin peeling, or any reaction on the inside of your mouth or nose? Unknown Did you  need to seek medical attention at a hospital or doctor's office? Yes When did it last happen?over 65 years ago If all above answers are "NO", may proceed with cephalosporin use. .    Medications Prior to Admission  Medication Sig Dispense Refill  . alfuzosin (UROXATRAL) 10 MG 24 hr tablet Take 10 mg by mouth every evening.    Marland Kitchen amLODipine (NORVASC) 10 MG tablet Take 1 tablet (10 mg total) by mouth daily. 30 tablet 0  . calcium citrate-vitamin D (CITRACAL+D) 315-200 MG-UNIT tablet Take 1 tablet by mouth 2 (two) times a week.    . cholecalciferol (VITAMIN D) 25 MCG (1000 UT) tablet Take 1,000 Units by mouth daily.    Marland Kitchen docusate sodium (COLACE) 250 MG capsule Take 250 mg by mouth 2 (two) times daily.    Marland Kitchen doxycycline (VIBRA-TABS) 100 MG tablet Take 1 tablet (100 mg total) by mouth 2 (two) times daily. 60 tablet 11  . lisinopril (ZESTRIL) 20 MG tablet Take 20 mg by mouth daily.    . Melatonin 5 MG TABS Take 5 mg by mouth at bedtime as needed (sleep).    . metoprolol succinate (TOPROL-XL) 50 MG 24 hr tablet Take 1 tablet (50 mg total) by mouth daily. Take with or immediately following a meal. (Patient taking differently: Take 50 mg by mouth every evening. Take with or immediately  following a meal.) 60 tablet 1  . Multiple Vitamin (MULTIVITAMIN WITH MINERALS) TABS tablet Take 1 tablet by mouth daily.    . naproxen sodium (ALEVE) 220 MG tablet Take 220 mg by mouth 2 (two) times daily as needed (pain.).    Marland Kitchen sulfaSALAzine (AZULFIDINE) 500 MG tablet Take 1,000 mg by mouth 2 (two) times daily.      No results found for this or any previous visit (from the past 48 hour(s)). No results found.  ROS General Not Present- Appetite Loss, Chills, Fatigue, Fever, Night Sweats, Weight Gain and Weight Loss. Skin Present- Dryness. Not Present- Change in Wart/Mole, Hives, Jaundice, New Lesions, Non-Healing Wounds, Rash and Ulcer. HEENT Present- Wears glasses/contact lenses. Not Present- Earache, Hearing  Loss, Hoarseness, Nose Bleed, Oral Ulcers, Ringing in the Ears, Seasonal Allergies, Sinus Pain, Sore Throat, Visual Disturbances and Yellow Eyes. Respiratory Present- Snoring. Not Present- Bloody sputum, Chronic Cough, Difficulty Breathing and Wheezing. Gastrointestinal Present- Change in Bowel Habits, Constipation and Hemorrhoids. Not Present- Abdominal Pain, Bloating, Bloody Stool, Chronic diarrhea, Difficulty Swallowing, Excessive gas, Gets full quickly at meals, Indigestion, Nausea, Rectal Pain and Vomiting. Male Genitourinary Present- Change in Urinary Stream, Frequency, Nocturia and Urgency. Not Present- Blood in Urine, Impotence, Painful Urination and Urine Leakage. Blood pressure (!) 164/81, pulse 60, temperature 98 F (36.7 C), temperature source Oral, resp. rate 18, height 5\' 6"  (1.676 m), weight 68.9 kg, SpO2 100 %. Physical Exam  General Mental Status-Alert. Orientation-Oriented X3. Abdomen Note: soft nontender, reducible uh Male Genitourinary Note: left groin pain but I cannot definitively feel a hernia, no rih, testes normal cv rrr Lungs clear  Assessment/Plan LEFT INGUINAL HERNIA (K40.90) Story: LIH repair with mesh We discussed observation versus repair. I think due to symptoms he should have this repaired.We discussed both laparoscopic and open inguinal hernia repairs. I described the procedure in detail. Goals should be achieved with surgery. We discussed the usage of mesh and the rationale behind that. We went over the pathophysiology of inguinal hernia. We have elected to perform open inguinal hernia repair with mesh. We discussed the risks including bleeding, infection, recurrence, postoperative pain and chronic groin pain, testicular injury, urinary retention, numbness in groin and around incision.  Rolm Bookbinder, MD 02/09/2019, 7:18 AM

## 2019-02-09 NOTE — Anesthesia Procedure Notes (Signed)
Anesthesia Regional Block: TAP block   Pre-Anesthetic Checklist: ,, timeout performed, Correct Patient, Correct Site, Correct Laterality, Correct Procedure, Correct Position, site marked, Risks and benefits discussed, pre-op evaluation,  At surgeon's request and post-op pain management  Laterality: Left  Prep: Maximum Sterile Barrier Precautions used, chloraprep       Needles:  Injection technique: Single-shot  Needle Type: Echogenic Stimulator Needle     Needle Length: 9cm  Needle Gauge: 21     Additional Needles:   Procedures:,,,, ultrasound used (permanent image in chart),,,,  Narrative:  Start time: 02/09/2019 7:00 AM End time: 02/09/2019 7:10 AM Injection made incrementally with aspirations every 5 mL.  Performed by: Personally  Anesthesiologist: Roderic Palau, MD  Additional Notes: 2% Lidocaine skin wheel.

## 2019-02-09 NOTE — Discharge Instructions (Signed)
[facility]  UMBILICAL OR INGUINAL HERNIA REPAIR: POST OP INSTRUCTIONS Take 650 mg of tylenol every six hours for next 72 hours then as needed. Use ice several times daily. Always review your discharge instruction sheet given to you by the facility where your surgery was performed. IF YOU HAVE DISABILITY OR FAMILY LEAVE FORMS, YOU MUST BRING THEM TO THE OFFICE FOR PROCESSING.   DO NOT GIVE THEM TO YOUR DOCTOR.  1. A  prescription for pain medication may be given to you upon discharge.  Take your pain medication as prescribed, if needed.  If narcotic pain medicine is not needed, then you may take acetaminophen (Tylenol), naprosyn (Alleve) or ibuprofen (Advil) as needed. 2. Take your usually prescribed medications unless otherwise directed. 3. If you need a refill on your pain medication, please contact your pharmacy.  They will contact our office to request authorization. Prescriptions will not be filled after 5 pm or on week-ends. 4. You should follow a light diet the first 24 hours after arrival home, such as soup and crackers, etc.  Be sure to include lots of fluids daily.  Resume your normal diet the day after surgery. 5. Most patients will experience some swelling and bruising around the umbilicus or in the groin and scrotum.  Ice packs and reclining will help.  Swelling and bruising can take several days to resolve.  6. It is common to experience some constipation if taking pain medication after surgery.  Increasing fluid intake and taking a stool softener (such as Colace) will usually help or prevent this problem from occurring.  A mild laxative (Milk of Magnesia or Miralax) should be taken according to package directions if there are no bowel movements after 48 hours. 7. Unless discharge instructions indicate otherwise, you may remove your bandages 48 hours after surgery, and you may shower at that time.  You may have steri-strips (small skin tapes) in place directly over  the incision.  These strips should be left on the skin for 7-10 days and will come off on their own.  If your surgeon used skin glue on the incision, you may shower in 24 hours.  The glue will flake off over the next 2-3 weeks.  Any sutures or staples will be removed at the office during your follow-up visit. 8. ACTIVITIES:  You may resume regular (light) daily activities beginning the next day--such as daily self-care, walking, climbing stairs--gradually increasing activities as tolerated.  You may have sexual intercourse when it is comfortable.  Refrain from any heavy lifting or straining until approved by your doctor. a. You may drive when you are no longer taking prescription pain medication, you can comfortably wear a seatbelt, and you can safely maneuver your car and apply brakes. b. RETURN TO WORK:  __________________________________________________________ 9. You should see your doctor in the office for a follow-up appointment approximately 2-3 weeks after your surgery.  Make sure that you call for this appointment within a day or two after you arrive home to insure a convenient appointment time. 10. OTHER INSTRUCTIONS:  __________________________________________________________________________________________________________________________________________________________________________________________  WHEN TO CALL YOUR DOCTOR: 1. Fever over 101.0 2. Inability to urinate 3. Nausea and/or vomiting 4. Extreme swelling or bruising 5. Continued bleeding from incision. 6. Increased pain, redness, or drainage from the incision  The clinic staff is available to answer your questions during regular business hours.  Please dont hesitate to call and ask to speak to one of the nurses for clinical concerns.  If you have  a medical emergency, go to the nearest emergency room or call 911.  A surgeon from [facility] is always on call at the hospital   [address], [place],  [place], [place]  [address] ?  [address], [place], [place]   [address] [phone] ? [phone] ? FAX [phone] Web site: [email or web address]

## 2019-02-09 NOTE — Transfer of Care (Signed)
Immediate Anesthesia Transfer of Care Note  Patient: Tanner Rice  Procedure(s) Performed: LEFT INGUINAL HERNIA REPAIR WITH MESH (Left Inguinal)  Patient Location: PACU  Anesthesia Type:GA combined with regional for post-op pain  Level of Consciousness: awake, alert  and patient cooperative  Airway & Oxygen Therapy: Patient Spontanous Breathing  Post-op Assessment: Report given to RN and Post -op Vital signs reviewed and stable  Post vital signs: Reviewed and stable  Last Vitals:  Vitals Value Taken Time  BP 164/103 02/09/19 0839  Temp    Pulse 62 02/09/19 0840  Resp 14 02/09/19 0840  SpO2 96 % 02/09/19 0840  Vitals shown include unvalidated device data.  Last Pain:  Vitals:   02/09/19 0636  TempSrc:   PainSc: 0-No pain      Patients Stated Pain Goal: 3 ([id number])  Complications: No apparent anesthesia complications

## 2019-02-09 NOTE — Interval H&P Note (Signed)
History and Physical Interval Note:  02/09/2019 7:20 AM  Tanner Rice  has presented today for surgery, with the diagnosis of LEFT INGUINAL HERNIA.  The various methods of treatment have been discussed with the patient and family. After consideration of risks, benefits and other options for treatment, the patient has consented to  Procedure(s) with comments: LEFT INGUINAL HERNIA REPAIR WITH MESH (Left) - GENERAL AND TAP BLOCK as a surgical intervention.  The patient's history has been reviewed, patient examined, no change in status, stable for surgery.  I have reviewed the patient's chart and labs.  Questions were answered to the patient's satisfaction.     Rolm Bookbinder

## 2019-02-10 ENCOUNTER — Encounter (HOSPITAL_COMMUNITY): Payer: Self-pay | Admitting: General Surgery

## 2019-03-18 DIAGNOSIS — M0579 Rheumatoid arthritis with rheumatoid factor of multiple sites without organ or systems involvement: Secondary | ICD-10-CM | POA: Diagnosis not present

## 2019-03-18 DIAGNOSIS — Z79899 Other long term (current) drug therapy: Secondary | ICD-10-CM | POA: Diagnosis not present

## 2019-03-18 DIAGNOSIS — R748 Abnormal levels of other serum enzymes: Secondary | ICD-10-CM | POA: Diagnosis not present

## 2019-03-29 ENCOUNTER — Other Ambulatory Visit: Payer: Self-pay | Admitting: Geriatric Medicine

## 2019-03-29 DIAGNOSIS — Z23 Encounter for immunization: Secondary | ICD-10-CM | POA: Diagnosis not present

## 2019-03-29 DIAGNOSIS — R7401 Elevation of levels of liver transaminase levels: Secondary | ICD-10-CM

## 2019-04-05 ENCOUNTER — Ambulatory Visit
Admission: RE | Admit: 2019-04-05 | Discharge: 2019-04-05 | Disposition: A | Discharge status: Home / Self Care | Payer: Medicare Other | Source: Ambulatory Visit | Attending: Geriatric Medicine | Admitting: Geriatric Medicine

## 2019-04-05 DIAGNOSIS — K8020 Calculus of gallbladder without cholecystitis without obstruction: Secondary | ICD-10-CM | POA: Diagnosis not present

## 2019-04-05 DIAGNOSIS — R7401 Elevation of levels of liver transaminase levels: Secondary | ICD-10-CM

## 2019-04-30 DIAGNOSIS — C61 Malignant neoplasm of prostate: Secondary | ICD-10-CM | POA: Diagnosis not present

## 2019-05-03 DIAGNOSIS — H43812 Vitreous degeneration, left eye: Secondary | ICD-10-CM | POA: Diagnosis not present

## 2019-05-03 DIAGNOSIS — H52203 Unspecified astigmatism, bilateral: Secondary | ICD-10-CM | POA: Diagnosis not present

## 2019-05-03 DIAGNOSIS — H35343 Macular cyst, hole, or pseudohole, bilateral: Secondary | ICD-10-CM | POA: Diagnosis not present

## 2019-05-03 DIAGNOSIS — H35373 Puckering of macula, bilateral: Secondary | ICD-10-CM | POA: Diagnosis not present

## 2019-05-11 DIAGNOSIS — C61 Malignant neoplasm of prostate: Secondary | ICD-10-CM | POA: Diagnosis not present

## 2019-05-11 DIAGNOSIS — R351 Nocturia: Secondary | ICD-10-CM | POA: Diagnosis not present

## 2019-05-11 DIAGNOSIS — N401 Enlarged prostate with lower urinary tract symptoms: Secondary | ICD-10-CM | POA: Diagnosis not present

## 2019-06-16 DIAGNOSIS — M25461 Effusion, right knee: Secondary | ICD-10-CM | POA: Diagnosis not present

## 2019-06-16 DIAGNOSIS — Z5181 Encounter for therapeutic drug level monitoring: Secondary | ICD-10-CM | POA: Diagnosis not present

## 2019-06-16 DIAGNOSIS — M0579 Rheumatoid arthritis with rheumatoid factor of multiple sites without organ or systems involvement: Secondary | ICD-10-CM | POA: Diagnosis not present

## 2019-06-16 DIAGNOSIS — M25561 Pain in right knee: Secondary | ICD-10-CM | POA: Diagnosis not present

## 2019-07-12 DIAGNOSIS — Z1389 Encounter for screening for other disorder: Secondary | ICD-10-CM | POA: Diagnosis not present

## 2019-07-12 DIAGNOSIS — M069 Rheumatoid arthritis, unspecified: Secondary | ICD-10-CM | POA: Diagnosis not present

## 2019-07-12 DIAGNOSIS — I10 Essential (primary) hypertension: Secondary | ICD-10-CM | POA: Diagnosis not present

## 2019-07-12 DIAGNOSIS — Z0000 Encounter for general adult medical examination without abnormal findings: Secondary | ICD-10-CM | POA: Diagnosis not present

## 2019-07-12 DIAGNOSIS — I700 Atherosclerosis of aorta: Secondary | ICD-10-CM | POA: Diagnosis not present

## 2019-07-12 DIAGNOSIS — E7800 Pure hypercholesterolemia, unspecified: Secondary | ICD-10-CM | POA: Diagnosis not present

## 2019-07-28 ENCOUNTER — Ambulatory Visit: Payer: Medicare Other | Admitting: Internal Medicine

## 2019-08-06 DIAGNOSIS — R972 Elevated prostate specific antigen [PSA]: Secondary | ICD-10-CM | POA: Diagnosis not present

## 2019-08-06 DIAGNOSIS — Z8042 Family history of malignant neoplasm of prostate: Secondary | ICD-10-CM | POA: Diagnosis not present

## 2019-08-10 ENCOUNTER — Other Ambulatory Visit: Payer: Self-pay

## 2019-08-10 ENCOUNTER — Ambulatory Visit (INDEPENDENT_AMBULATORY_CARE_PROVIDER_SITE_OTHER): Payer: Medicare Other | Admitting: Internal Medicine

## 2019-08-10 DIAGNOSIS — M4644 Discitis, unspecified, thoracic region: Secondary | ICD-10-CM | POA: Diagnosis not present

## 2019-08-10 MED ORDER — DOXYCYCLINE HYCLATE 100 MG PO TABS: 100.0000 mg | ORAL_TABLET | Freq: Two times a day (BID) | ORAL | 11 refills | Status: DC

## 2019-08-10 NOTE — Progress Notes (Signed)
irtual Visit via Telephone Note  I connected with@ on 08/10/19 at  8:45 AM EST by a telephone enabled telemedicine application and verified that I am speaking with the correct person using two identifiers.  Location: Patient: Home Provider: [facility]   I discussed the limitations of evaluation and management by telemedicine and the availability of in person appointments. The patient expressed understanding and agreed to proceed.  [facility]  Patient Active Problem List   Diagnosis Date Noted  . MRSA bacteremia     Priority: High  . Thoracic discitis     Priority: High  . Constipation 05/07/2016    Priority: Medium  . Protein-calorie malnutrition ([place]) 05/07/2016    Priority: Medium  . Ileus ([place])   . Hyperglycemia 01/30/2016  . Normocytic anemia 01/30/2016  . Thrombocytopenia ([place]) 01/30/2016  . Atherosclerotic peripheral vascular disease ([place]) 01/30/2016  . Degenerative joint disease of spine 01/30/2016  . BPH (benign prostatic hyperplasia) 01/30/2016  . Rheumatoid arthritis involving multiple sites with positive rheumatoid factor ([place]) 01/29/2016  . Essential hypertension 01/29/2016  . Hyponatremia 01/29/2016  . Left adrenal mass ([place]) 01/29/2016    Patient's Medications  New Prescriptions   No medications on file  Previous Medications   ALFUZOSIN (UROXATRAL) 10 MG 24 HR TABLET    Take 10 mg by mouth every evening.   AMLODIPINE (NORVASC) 10 MG TABLET    Take 1 tablet (10 mg total) by mouth daily.   CALCIUM CITRATE-VITAMIN D (CITRACAL+D) 315-200 MG-UNIT TABLET    Take 1 tablet by mouth 2 (two) times a week.   CHOLECALCIFEROL (VITAMIN D) 25 MCG (1000 UT) TABLET    Take 1,000 Units by mouth daily.   DOCUSATE SODIUM (COLACE) 250 MG CAPSULE    Take 250 mg by mouth 2 (two) times daily.   LISINOPRIL (ZESTRIL) 20 MG TABLET    Take 20 mg by mouth daily.   MELATONIN 5 MG TABS    Take 5 mg by mouth at bedtime as needed (sleep).   METOPROLOL SUCCINATE  (TOPROL-XL) 50 MG 24 HR TABLET    Take 1 tablet (50 mg total) by mouth daily. Take with or immediately following a meal.   MULTIPLE VITAMIN (MULTIVITAMIN WITH MINERALS) TABS TABLET    Take 1 tablet by mouth daily.   NAPROXEN SODIUM (ALEVE) 220 MG TABLET    Take 220 mg by mouth 2 (two) times daily as needed (pain.).   SULFASALAZINE (AZULFIDINE) 500 MG TABLET    Take 1,000 mg by mouth 2 (two) times daily.   TRAMADOL (ULTRAM) 50 MG TABLET    Take 1 tablet (50 mg total) by mouth every 6 (six) hours as needed.  Modified Medications   Modified Medication Previous Medication   DOXYCYCLINE (VIBRA-TABS) 100 MG TABLET doxycycline (VIBRA-TABS) 100 MG tablet      Take 1 tablet (100 mg total) by mouth 2 (two) times daily.    Take 1 tablet (100 mg total) by mouth 2 (two) times daily.  Discontinued Medications   No medications on file    History of Present Illness: I called and spoke with Tanner Rice today by phone.  He has not had any problems obtaining, taking or tolerating his doxycycline.  He developed MRSA bacteremia and thoracic discitis in August 2017 and had a relapse of his severe back pain after completing 2 months of antibiotic therapy.  He has been on chronic suppressive doxycycline ever since then.  He has not had any fever, nausea, vomiting, diarrhea and  is not having any back pain currently.  He is bothered by his rheumatoid arthritis with lots of pain in his hands, wrists and knees.  He says that he does not want to go back on his Remicade.  He and his wife got their first dose of Pfizer vaccine last week.  Past Medical History:  Diagnosis Date  . Hypertension   . Melanoma ([place])    upper thigh on right leg  . Rheumatoid arthritis(714.0)   . Thoracic discitis     Social History   Tobacco Use  . Smoking status: Former Smoker    Years: 25.00    Types: Pipe  . Smokeless tobacco: Never Used  . Tobacco comment: quit 42 yrs. ago  Substance Use Topics  . Alcohol use: Yes    Comment: 4x weel  glass wine  . Drug use: No    Family History  Problem Relation Age of Onset  . Prostate cancer Father   . Prostate cancer Brother   . Osteoarthritis Brother     Allergies  Allergen Reactions  . Other Diarrhea, Nausea And Vomiting and Other (See Comments)    Seafood - mussels   . Penicillins Cross Reactors Hives    Did it involve swelling of the face/tongue/throat, SOB, or low BP? Unknown Did it involve sudden or severe rash/hives, skin peeling, or any reaction on the inside of your mouth or nose? Unknown Did you need to seek medical attention at a hospital or doctor's office? Yes When did it last happen?over 65 years ago If all above answers are "NO", may proceed with cephalosporin use. Marland Kitchen    Health Maintenance  Topic Date Due  . TETANUS/TDAP  08/03/1954  . PNA vac Low Risk Adult (1 of 2 - PCV13) 08/03/2000  . INFLUENZA VACCINE  Completed    Observations/Objective: Lab Results  Component Value Date   WBC 6.9 02/03/2019   HGB 13.1 02/03/2019   HCT 40.8 02/03/2019   MCV 96.2 02/03/2019   PLT 267 02/03/2019   CMP     Component Value Date/Time   NA 137 02/03/2019 1112   K 3.9 02/03/2019 1112   CL 100 02/03/2019 1112   CO2 26 02/03/2019 1112   GLUCOSE 121 (H) 02/03/2019 1112   BUN 15 02/03/2019 1112   CREATININE 0.76 02/03/2019 1112   CREATININE 0.83 07/01/2017 1632   CALCIUM 10.1 02/03/2019 1112   PROT 7.0 10/24/2016 0131   ALBUMIN 4.0 10/24/2016 0131   AST 57 (H) 10/24/2016 0131   ALT 31 10/24/2016 0131   ALKPHOS 109 10/24/2016 0131   BILITOT 0.5 10/24/2016 0131   GFRNONAA >60 02/03/2019 1112   GFRAA >60 02/03/2019 1112    Assessment and Plan: He is doing well on chronic suppressive doxycycline therapy for relapsed MRSA thoracic discitis.  He prefers to continue it at this time.  Follow Up Instructions: 1. Continue doxycycline 2. Follow-up here in 6 months I discussed the assessment and treatment plan with the patient. The patient was provided an  opportunity to ask questions and all were answered. The patient agreed with the plan and demonstrated an understanding of the instructions.   The patient was advised to call back or seek an in-person evaluation if the symptoms worsen or if the condition fails to improve as anticipated.  I spent 14 minutes conducting this visit  Michel Bickers, [place] for [place] [phone] pager   [phone] cell 08/10/2019, 8:39 AM

## 2019-08-20 DIAGNOSIS — C61 Malignant neoplasm of prostate: Secondary | ICD-10-CM | POA: Diagnosis not present

## 2019-09-15 DIAGNOSIS — M0579 Rheumatoid arthritis with rheumatoid factor of multiple sites without organ or systems involvement: Secondary | ICD-10-CM | POA: Diagnosis not present

## 2019-09-15 DIAGNOSIS — Z79899 Other long term (current) drug therapy: Secondary | ICD-10-CM | POA: Diagnosis not present

## 2019-09-15 DIAGNOSIS — D649 Anemia, unspecified: Secondary | ICD-10-CM | POA: Diagnosis not present

## 2019-10-12 DIAGNOSIS — M0579 Rheumatoid arthritis with rheumatoid factor of multiple sites without organ or systems involvement: Secondary | ICD-10-CM | POA: Diagnosis not present

## 2019-10-12 DIAGNOSIS — Z79899 Other long term (current) drug therapy: Secondary | ICD-10-CM | POA: Diagnosis not present

## 2019-11-16 DIAGNOSIS — Z79899 Other long term (current) drug therapy: Secondary | ICD-10-CM | POA: Diagnosis not present

## 2019-11-16 DIAGNOSIS — M0579 Rheumatoid arthritis with rheumatoid factor of multiple sites without organ or systems involvement: Secondary | ICD-10-CM | POA: Diagnosis not present

## 2019-11-18 ENCOUNTER — Telehealth: Payer: Self-pay | Admitting: Internal Medicine

## 2019-11-18 NOTE — Telephone Encounter (Signed)
I spoke to Dr. Jani Files who let me know that Tanner Rice's rheumatoid arthritis is back with a vengeance and not responding to methotrexate.  I told him that I have no problem with Tanner Rice restarting Remicade or other disease modifying antirheumatic medications if that is what he needs.

## 2019-11-18 NOTE — Telephone Encounter (Signed)
-----   Message from Aundria Rud, [place] sent at 11/17/2019  4:40 PM EDT ----- Regarding: Prvoider Call Received call from Dr. Florene Glen requesting to speak with you regarding mutual patient. States this is not urgent and can wait until you are available.  C: [place], [place]

## 2019-11-22 DIAGNOSIS — C61 Malignant neoplasm of prostate: Secondary | ICD-10-CM | POA: Diagnosis not present

## 2019-12-09 DIAGNOSIS — Z79899 Other long term (current) drug therapy: Secondary | ICD-10-CM | POA: Diagnosis not present

## 2019-12-09 DIAGNOSIS — M0579 Rheumatoid arthritis with rheumatoid factor of multiple sites without organ or systems involvement: Secondary | ICD-10-CM | POA: Diagnosis not present

## 2019-12-29 DIAGNOSIS — D692 Other nonthrombocytopenic purpura: Secondary | ICD-10-CM | POA: Diagnosis not present

## 2019-12-29 DIAGNOSIS — L821 Other seborrheic keratosis: Secondary | ICD-10-CM | POA: Diagnosis not present

## 2019-12-29 DIAGNOSIS — L820 Inflamed seborrheic keratosis: Secondary | ICD-10-CM | POA: Diagnosis not present

## 2019-12-29 DIAGNOSIS — Z85828 Personal history of other malignant neoplasm of skin: Secondary | ICD-10-CM | POA: Diagnosis not present

## 2019-12-29 DIAGNOSIS — L570 Actinic keratosis: Secondary | ICD-10-CM | POA: Diagnosis not present

## 2019-12-29 DIAGNOSIS — D1801 Hemangioma of skin and subcutaneous tissue: Secondary | ICD-10-CM | POA: Diagnosis not present

## 2019-12-29 DIAGNOSIS — D485 Neoplasm of uncertain behavior of skin: Secondary | ICD-10-CM | POA: Diagnosis not present

## 2019-12-29 DIAGNOSIS — Z85820 Personal history of malignant melanoma of skin: Secondary | ICD-10-CM | POA: Diagnosis not present

## 2019-12-29 DIAGNOSIS — L720 Epidermal cyst: Secondary | ICD-10-CM | POA: Diagnosis not present

## 2019-12-29 DIAGNOSIS — L814 Other melanin hyperpigmentation: Secondary | ICD-10-CM | POA: Diagnosis not present

## 2020-01-03 DIAGNOSIS — M0579 Rheumatoid arthritis with rheumatoid factor of multiple sites without organ or systems involvement: Secondary | ICD-10-CM | POA: Diagnosis not present

## 2020-02-02 DIAGNOSIS — M0579 Rheumatoid arthritis with rheumatoid factor of multiple sites without organ or systems involvement: Secondary | ICD-10-CM | POA: Diagnosis not present

## 2020-02-04 DIAGNOSIS — M069 Rheumatoid arthritis, unspecified: Secondary | ICD-10-CM | POA: Diagnosis not present

## 2020-02-04 DIAGNOSIS — I10 Essential (primary) hypertension: Secondary | ICD-10-CM | POA: Diagnosis not present

## 2020-02-10 ENCOUNTER — Encounter: Payer: Self-pay | Admitting: Internal Medicine

## 2020-02-10 ENCOUNTER — Other Ambulatory Visit: Payer: Self-pay

## 2020-02-10 ENCOUNTER — Ambulatory Visit (INDEPENDENT_AMBULATORY_CARE_PROVIDER_SITE_OTHER): Payer: Medicare Other | Admitting: Internal Medicine

## 2020-02-10 ENCOUNTER — Ambulatory Visit: Payer: Medicare Other | Admitting: Internal Medicine

## 2020-02-10 DIAGNOSIS — M4644 Discitis, unspecified, thoracic region: Secondary | ICD-10-CM

## 2020-02-10 MED ORDER — DOXYCYCLINE HYCLATE 100 MG PO TABS: 100.0000 mg | ORAL_TABLET | Freq: Two times a day (BID) | ORAL | 11 refills | Status: DC

## 2020-02-10 NOTE — Progress Notes (Signed)
[facility] for Infectious Disease  Patient Active Problem List   Diagnosis Date Noted  . MRSA bacteremia     Priority: High  . Thoracic discitis     Priority: High  . Constipation 05/07/2016    Priority: Medium  . Protein-calorie malnutrition ([place]) 05/07/2016    Priority: Medium  . Ileus ([place])   . Hyperglycemia 01/30/2016  . Normocytic anemia 01/30/2016  . Thrombocytopenia ([place]) 01/30/2016  . Atherosclerotic peripheral vascular disease ([place]) 01/30/2016  . Degenerative joint disease of spine 01/30/2016  . BPH (benign prostatic hyperplasia) 01/30/2016  . Rheumatoid arthritis involving multiple sites with positive rheumatoid factor ([place]) 01/29/2016  . Essential hypertension 01/29/2016  . Hyponatremia 01/29/2016  . Left adrenal mass ([place]) 01/29/2016    Patient's Medications  New Prescriptions   No medications on file  Previous Medications   ALFUZOSIN (UROXATRAL) 10 MG 24 HR TABLET    Take 10 mg by mouth every evening.   AMLODIPINE (NORVASC) 10 MG TABLET    Take 1 tablet (10 mg total) by mouth daily.   CALCIUM CITRATE-VITAMIN D (CITRACAL+D) 315-200 MG-UNIT TABLET    Take 1 tablet by mouth 2 (two) times a week.   CHOLECALCIFEROL (VITAMIN D) 25 MCG (1000 UT) TABLET    Take 1,000 Units by mouth daily.   DOCUSATE SODIUM (COLACE) 250 MG CAPSULE    Take 250 mg by mouth 2 (two) times daily.   FERROUS SULFATE 90 (18 FE) MG TABS    Take 1 tablet by mouth daily.   FOLIC ACID (FOLVITE) 1 MG TABLET    Take 1 mg by mouth daily.   GOLIMUMAB (SIMPONI ARIA) 50 MG/4ML SOLN INJECTION    Inject into the vein.   LISINOPRIL (ZESTRIL) 20 MG TABLET    Take 20 mg by mouth daily.   MELATONIN 5 MG TABS    Take 5 mg by mouth at bedtime as needed (sleep).   METHOTREXATE (RHEUMATREX) 2.5 MG TABLET    Take 15 mg by mouth once a week.   METHYLPREDNISOLONE (MEDROL DOSEPAK) 4 MG TBPK TABLET    TAKE 6 TABLETS BY MOUTH ON DAY 1, 5 TABLETS ON DAY 2, 4 TABLETS ON DAY 3, 3 TABLETS ON DAY 4, 2 ON DAY  5, 1 ON DAY 6   METOPROLOL SUCCINATE (TOPROL-XL) 50 MG 24 HR TABLET    Take 1 tablet (50 mg total) by mouth daily. Take with or immediately following a meal.   MULTIPLE VITAMIN (MULTIVITAMIN WITH MINERALS) TABS TABLET    Take 1 tablet by mouth daily.   NAPROXEN SODIUM (ALEVE) 220 MG TABLET    Take 220 mg by mouth 2 (two) times daily as needed (pain.).   SULFASALAZINE (AZULFIDINE) 500 MG TABLET    Take 1,000 mg by mouth 2 (two) times daily.   TRAMADOL (ULTRAM) 50 MG TABLET    Take 1 tablet (50 mg total) by mouth every 6 (six) hours as needed.  Modified Medications   Modified Medication Previous Medication   DOXYCYCLINE (VIBRA-TABS) 100 MG TABLET doxycycline (VIBRA-TABS) 100 MG tablet      Take 1 tablet (100 mg total) by mouth 2 (two) times daily.    Take 1 tablet (100 mg total) by mouth 2 (two) times daily.  Discontinued Medications   No medications on file    Subjective: Tanner Rice is in for his routine follow-up visit. He has not had any problems obtaining, taking or tolerating his doxycycline.  He developed MRSA bacteremia  and thoracic discitis in August 2017 and had a relapse of his severe back pain after completing 2 months of antibiotic therapy.  He has been on chronic suppressive doxycycline ever since then.  He has not had any fever, nausea, vomiting, diarrhea and is not having any back pain currently.  He is bothered by his rheumatoid arthritis with lots of pain in his hands, wrists and knees.  He and his wife received the [place] vaccines in late spring.  Review of Systems: Review of Systems  Constitutional: Negative for chills, diaphoresis, fever and weight loss.  Gastrointestinal: Positive for constipation. Negative for abdominal pain, diarrhea, nausea and vomiting.  Musculoskeletal: Positive for joint pain. Negative for back pain.    Past Medical History:  Diagnosis Date  . Hypertension   . Melanoma ([place])    upper thigh on right leg  . Rheumatoid arthritis(714.0)   . Thoracic  discitis     Social History   Tobacco Use  . Smoking status: Former Smoker    Years: 25.00    Types: Pipe  . Smokeless tobacco: Never Used  . Tobacco comment: quit 42 yrs. ago  Vaping Use  . Vaping Use: Never used  Substance Use Topics  . Alcohol use: Yes    Comment: 4x weel glass wine  . Drug use: No    Family History  Problem Relation Age of Onset  . Prostate cancer Father   . Prostate cancer Brother   . Osteoarthritis Brother     Allergies  Allergen Reactions  . Other Diarrhea, Nausea And Vomiting and Other (See Comments)    Seafood - mussels   . Penicillins Cross Reactors Hives    Did it involve swelling of the face/tongue/throat, SOB, or low BP? Unknown Did it involve sudden or severe rash/hives, skin peeling, or any reaction on the inside of your mouth or nose? Unknown Did you need to seek medical attention at a hospital or doctor's office? Yes When did it last happen?over 65 years ago If all above answers are "NO", may proceed with cephalosporin use. .    Objective: Vitals:   02/10/20 1424  BP: 135/79  Pulse: 65  Temp: 97.6 F (36.4 C)  TempSrc: Oral  SpO2: 99%  Weight: 152 lb (68.9 kg)  Height: 5\' 6"  (1.676 m)   Body mass index is 24.53 kg/m.  Physical Exam Constitutional:      Comments: He is in good spirits.  Cardiovascular:     Rate and Rhythm: Normal rate and regular rhythm.     Heart sounds: No murmur heard.   Pulmonary:     Effort: Pulmonary effort is normal.     Breath sounds: Normal breath sounds.  Abdominal:     General: There is no distension.     Palpations: Abdomen is soft.  Psychiatric:        Mood and Affect: Mood normal.     Lab Results    Problem List Items Addressed This Visit      High   Thoracic discitis    He is doing well and tolerating chronic suppressive doxycycline.  He is not interested in stopping doxycycline as long as he is tolerating it.  He will follow-up in 1 year.  He knows that he can contact  me at any time if he needs to be seen sooner.      Relevant Medications   golimumab (SIMPONI ARIA) 50 MG/4ML SOLN injection   methotrexate (RHEUMATREX) 2.5 MG tablet   methylPREDNISolone (  MEDROL DOSEPAK) 4 MG TBPK tablet   doxycycline (VIBRA-TABS) 100 MG tablet       Michel Bickers, MD [facility] for [facility] [phone] pager   [phone] cell 02/10/2020, 2:41 PM

## 2020-02-10 NOTE — Assessment & Plan Note (Signed)
He is doing well and tolerating chronic suppressive doxycycline.  He is not interested in stopping doxycycline as long as he is tolerating it.  He will follow-up in 1 year.  He knows that he can contact me at any time if he needs to be seen sooner.

## 2020-03-16 DIAGNOSIS — M0579 Rheumatoid arthritis with rheumatoid factor of multiple sites without organ or systems involvement: Secondary | ICD-10-CM | POA: Diagnosis not present

## 2020-03-16 DIAGNOSIS — Z79899 Other long term (current) drug therapy: Secondary | ICD-10-CM | POA: Diagnosis not present

## 2020-03-29 DIAGNOSIS — M0579 Rheumatoid arthritis with rheumatoid factor of multiple sites without organ or systems involvement: Secondary | ICD-10-CM | POA: Diagnosis not present

## 2020-04-02 DIAGNOSIS — Z23 Encounter for immunization: Secondary | ICD-10-CM | POA: Diagnosis not present

## 2020-04-10 DIAGNOSIS — H903 Sensorineural hearing loss, bilateral: Secondary | ICD-10-CM | POA: Diagnosis not present

## 2020-05-24 DIAGNOSIS — M0579 Rheumatoid arthritis with rheumatoid factor of multiple sites without organ or systems involvement: Secondary | ICD-10-CM | POA: Diagnosis not present

## 2020-07-04 DIAGNOSIS — Z79899 Other long term (current) drug therapy: Secondary | ICD-10-CM | POA: Diagnosis not present

## 2020-07-04 DIAGNOSIS — M0579 Rheumatoid arthritis with rheumatoid factor of multiple sites without organ or systems involvement: Secondary | ICD-10-CM | POA: Diagnosis not present

## 2020-07-07 DIAGNOSIS — H35343 Macular cyst, hole, or pseudohole, bilateral: Secondary | ICD-10-CM | POA: Diagnosis not present

## 2020-07-07 DIAGNOSIS — H52203 Unspecified astigmatism, bilateral: Secondary | ICD-10-CM | POA: Diagnosis not present

## 2020-07-07 DIAGNOSIS — H43813 Vitreous degeneration, bilateral: Secondary | ICD-10-CM | POA: Diagnosis not present

## 2020-07-07 DIAGNOSIS — H0100A Unspecified blepharitis right eye, upper and lower eyelids: Secondary | ICD-10-CM | POA: Diagnosis not present

## 2020-07-12 DIAGNOSIS — Z0000 Encounter for general adult medical examination without abnormal findings: Secondary | ICD-10-CM | POA: Diagnosis not present

## 2020-07-12 DIAGNOSIS — M069 Rheumatoid arthritis, unspecified: Secondary | ICD-10-CM | POA: Diagnosis not present

## 2020-07-12 DIAGNOSIS — Z1389 Encounter for screening for other disorder: Secondary | ICD-10-CM | POA: Diagnosis not present

## 2020-07-12 DIAGNOSIS — I700 Atherosclerosis of aorta: Secondary | ICD-10-CM | POA: Diagnosis not present

## 2020-07-12 DIAGNOSIS — I10 Essential (primary) hypertension: Secondary | ICD-10-CM | POA: Diagnosis not present

## 2020-07-19 DIAGNOSIS — M0579 Rheumatoid arthritis with rheumatoid factor of multiple sites without organ or systems involvement: Secondary | ICD-10-CM | POA: Diagnosis not present

## 2020-09-13 DIAGNOSIS — M0579 Rheumatoid arthritis with rheumatoid factor of multiple sites without organ or systems involvement: Secondary | ICD-10-CM | POA: Diagnosis not present

## 2020-09-29 DIAGNOSIS — C61 Malignant neoplasm of prostate: Secondary | ICD-10-CM | POA: Diagnosis not present

## 2020-10-05 ENCOUNTER — Observation Stay (HOSPITAL_COMMUNITY)
Admission: EM | Admit: 2020-10-05 | Discharge: 2020-10-06 | Disposition: A | Discharge status: Home / Self Care | Payer: Medicare Other | Attending: Internal Medicine | Admitting: Internal Medicine

## 2020-10-05 ENCOUNTER — Emergency Department (HOSPITAL_COMMUNITY): Payer: Medicare Other

## 2020-10-05 ENCOUNTER — Emergency Department (HOSPITAL_BASED_OUTPATIENT_CLINIC_OR_DEPARTMENT_OTHER): Payer: Medicare Other

## 2020-10-05 ENCOUNTER — Encounter (HOSPITAL_COMMUNITY): Payer: Self-pay

## 2020-10-05 ENCOUNTER — Observation Stay (HOSPITAL_COMMUNITY): Payer: Medicare Other

## 2020-10-05 DIAGNOSIS — I10 Essential (primary) hypertension: Secondary | ICD-10-CM | POA: Diagnosis not present

## 2020-10-05 DIAGNOSIS — Z20822 Contact with and (suspected) exposure to covid-19: Secondary | ICD-10-CM | POA: Insufficient documentation

## 2020-10-05 DIAGNOSIS — I4510 Unspecified right bundle-branch block: Secondary | ICD-10-CM | POA: Diagnosis not present

## 2020-10-05 DIAGNOSIS — E162 Hypoglycemia, unspecified: Secondary | ICD-10-CM | POA: Diagnosis not present

## 2020-10-05 DIAGNOSIS — R4701 Aphasia: Secondary | ICD-10-CM | POA: Diagnosis not present

## 2020-10-05 DIAGNOSIS — R410 Disorientation, unspecified: Secondary | ICD-10-CM | POA: Diagnosis not present

## 2020-10-05 DIAGNOSIS — I491 Atrial premature depolarization: Secondary | ICD-10-CM | POA: Diagnosis not present

## 2020-10-05 DIAGNOSIS — N400 Enlarged prostate without lower urinary tract symptoms: Secondary | ICD-10-CM | POA: Diagnosis not present

## 2020-10-05 DIAGNOSIS — Z87891 Personal history of nicotine dependence: Secondary | ICD-10-CM | POA: Insufficient documentation

## 2020-10-05 DIAGNOSIS — J189 Pneumonia, unspecified organism: Secondary | ICD-10-CM

## 2020-10-05 DIAGNOSIS — G9341 Metabolic encephalopathy: Secondary | ICD-10-CM | POA: Diagnosis not present

## 2020-10-05 DIAGNOSIS — R4182 Altered mental status, unspecified: Secondary | ICD-10-CM | POA: Diagnosis present

## 2020-10-05 DIAGNOSIS — R471 Dysarthria and anarthria: Secondary | ICD-10-CM | POA: Insufficient documentation

## 2020-10-05 DIAGNOSIS — M0579 Rheumatoid arthritis with rheumatoid factor of multiple sites without organ or systems involvement: Secondary | ICD-10-CM | POA: Diagnosis present

## 2020-10-05 DIAGNOSIS — M4644 Discitis, unspecified, thoracic region: Secondary | ICD-10-CM

## 2020-10-05 DIAGNOSIS — Y900 Blood alcohol level of less than 20 mg/100 ml: Secondary | ICD-10-CM | POA: Insufficient documentation

## 2020-10-05 DIAGNOSIS — Z79899 Other long term (current) drug therapy: Secondary | ICD-10-CM | POA: Insufficient documentation

## 2020-10-05 DIAGNOSIS — R2681 Unsteadiness on feet: Secondary | ICD-10-CM | POA: Insufficient documentation

## 2020-10-05 DIAGNOSIS — E161 Other hypoglycemia: Secondary | ICD-10-CM | POA: Diagnosis not present

## 2020-10-05 DIAGNOSIS — I499 Cardiac arrhythmia, unspecified: Secondary | ICD-10-CM | POA: Diagnosis not present

## 2020-10-05 DIAGNOSIS — R531 Weakness: Secondary | ICD-10-CM | POA: Diagnosis not present

## 2020-10-05 LAB — PROTIME-INR
INR: 1.1 (ref 0.8–1.2)
Prothrombin Time: 13.8 seconds (ref 11.4–15.2)

## 2020-10-05 LAB — DIFFERENTIAL
Abs Immature Granulocytes: 0.02 10*3/uL (ref 0.00–0.07)
Basophils Absolute: 0.1 10*3/uL (ref 0.0–0.1)
Basophils Relative: 1 %
Eosinophils Absolute: 0 10*3/uL (ref 0.0–0.5)
Eosinophils Relative: 1 %
Immature Granulocytes: 0 %
Lymphocytes Relative: 19 %
Lymphs Abs: 1.3 10*3/uL (ref 0.7–4.0)
Monocytes Absolute: 0.6 10*3/uL (ref 0.1–1.0)
Monocytes Relative: 9 %
Neutro Abs: 4.9 10*3/uL (ref 1.7–7.7)
Neutrophils Relative %: 70 %

## 2020-10-05 LAB — I-STAT CHEM 8, ED
BUN: 18 mg/dL (ref 8–23)
Calcium, Ion: 1.19 mmol/L (ref 1.15–1.40)
Chloride: 105 mmol/L (ref 98–111)
Creatinine, Ser: 0.7 mg/dL (ref 0.61–1.24)
Glucose, Bld: 99 mg/dL (ref 70–99)
HCT: 36 % — ABNORMAL LOW (ref 39.0–52.0)
Hemoglobin: 12.2 g/dL — ABNORMAL LOW (ref 13.0–17.0)
Potassium: 3.8 mmol/L (ref 3.5–5.1)
Sodium: 138 mmol/L (ref 135–145)
TCO2: 24 mmol/L (ref 22–32)

## 2020-10-05 LAB — CBC
HCT: 37.7 % — ABNORMAL LOW (ref 39.0–52.0)
Hemoglobin: 12.8 g/dL — ABNORMAL LOW (ref 13.0–17.0)
MCH: 33.7 pg (ref 26.0–34.0)
MCHC: 34 g/dL (ref 30.0–36.0)
MCV: 99.2 fL (ref 80.0–100.0)
Platelets: 186 10*3/uL (ref 150–400)
RBC: 3.8 MIL/uL — ABNORMAL LOW (ref 4.22–5.81)
RDW: 13 % (ref 11.5–15.5)
WBC: 6.9 10*3/uL (ref 4.0–10.5)
nRBC: 0 % (ref 0.0–0.2)

## 2020-10-05 LAB — APTT: aPTT: 39 seconds — ABNORMAL HIGH (ref 24–36)

## 2020-10-05 LAB — URINALYSIS, ROUTINE W REFLEX MICROSCOPIC
Bilirubin Urine: NEGATIVE
Glucose, UA: NEGATIVE mg/dL
Hgb urine dipstick: NEGATIVE
Ketones, ur: NEGATIVE mg/dL
Leukocytes,Ua: NEGATIVE
Nitrite: NEGATIVE
Protein, ur: NEGATIVE mg/dL
Specific Gravity, Urine: 1.021 (ref 1.005–1.030)
pH: 8 (ref 5.0–8.0)

## 2020-10-05 LAB — COMPREHENSIVE METABOLIC PANEL
ALT: 17 U/L (ref 0–44)
AST: 26 U/L (ref 15–41)
Albumin: 4.1 g/dL (ref 3.5–5.0)
Alkaline Phosphatase: 64 U/L (ref 38–126)
Anion gap: 8 (ref 5–15)
BUN: 15 mg/dL (ref 8–23)
CO2: 23 mmol/L (ref 22–32)
Calcium: 9.9 mg/dL (ref 8.9–10.3)
Chloride: 104 mmol/L (ref 98–111)
Creatinine, Ser: 0.77 mg/dL (ref 0.61–1.24)
GFR, Estimated: >60 mL/min (ref >60)
Glucose, Bld: 100 mg/dL — ABNORMAL HIGH (ref 70–99)
Potassium: 3.7 mmol/L (ref 3.5–5.1)
Sodium: 135 mmol/L (ref 135–145)
Total Bilirubin: 0.5 mg/dL (ref 0.3–1.2)
Total Protein: 6.6 g/dL (ref 6.5–8.1)

## 2020-10-05 LAB — RESP PANEL BY RT-PCR (FLU A&B, COVID) ARPGX2
Influenza A by PCR: NEGATIVE
Influenza B by PCR: NEGATIVE
SARS Coronavirus 2 by RT PCR: NEGATIVE

## 2020-10-05 LAB — AMMONIA: Ammonia: 25 umol/L (ref 9–35)

## 2020-10-05 LAB — ETHANOL: Alcohol, Ethyl (B): <10 mg/dL (ref <10)

## 2020-10-05 LAB — TROPONIN I (HIGH SENSITIVITY): Troponin I (High Sensitivity): 11 ng/L (ref <18)

## 2020-10-05 LAB — SALICYLATE LEVEL: Salicylate Lvl: <7 mg/dL — ABNORMAL LOW (ref 7.0–30.0)

## 2020-10-05 LAB — LACTIC ACID, PLASMA: Lactic Acid, Venous: 1.8 mmol/L (ref 0.5–1.9)

## 2020-10-05 MED ORDER — LACTATED RINGERS IV SOLN: INTRAVENOUS | Status: DC

## 2020-10-05 MED ORDER — DOXYCYCLINE HYCLATE 100 MG PO TABS
100.0000 mg | ORAL_TABLET | Freq: Two times a day (BID) | ORAL | Status: DC
  Administered 2020-10-05 – 2020-10-06 (×2): 100 mg via ORAL
  Filled 2020-10-05 (×3): qty 1

## 2020-10-05 MED ORDER — LACTATED RINGERS IV BOLUS
500.0000 mL | Freq: Once | INTRAVENOUS | Status: AC
  Administered 2020-10-05: 500 mL via INTRAVENOUS

## 2020-10-05 MED ORDER — LACTATED RINGERS IV BOLUS: 1000.0000 mL | Freq: Once | INTRAVENOUS | Status: DC

## 2020-10-05 MED ORDER — METHOTREXATE 2.5 MG PO TABS
10.0000 mg | ORAL_TABLET | ORAL | Status: DC
  Administered 2020-10-06: 10 mg via ORAL
  Filled 2020-10-05 (×2): qty 4

## 2020-10-05 MED ORDER — ALFUZOSIN HCL ER 10 MG PO TB24
10.0000 mg | ORAL_TABLET | Freq: Every evening | ORAL | Status: DC
  Filled 2020-10-05 (×3): qty 1

## 2020-10-05 MED ORDER — NALOXONE HCL 0.4 MG/ML IJ SOLN
INTRAMUSCULAR | Status: AC
  Filled 2020-10-05: qty 1

## 2020-10-05 MED ORDER — SODIUM CHLORIDE 0.9% FLUSH: 3.0000 mL | Freq: Once | INTRAVENOUS | Status: DC

## 2020-10-05 MED ORDER — POLYETHYLENE GLYCOL 3350 17 G PO PACK
17.0000 g | PACK | Freq: Every day | ORAL | Status: DC | PRN
Start: 2020-10-05 — End: 2020-10-06

## 2020-10-05 MED ORDER — MELATONIN 5 MG PO TABS: 5.0000 mg | ORAL_TABLET | Freq: Every evening | ORAL | Status: DC | PRN

## 2020-10-05 MED ORDER — LACTATED RINGERS IV SOLN: INTRAVENOUS | Status: AC

## 2020-10-05 MED ORDER — IOHEXOL 350 MG/ML SOLN
75.0000 mL | Freq: Once | INTRAVENOUS | Status: AC | PRN
  Administered 2020-10-05: 75 mL via INTRAVENOUS

## 2020-10-05 MED ORDER — ONDANSETRON HCL 4 MG/2ML IJ SOLN: 4.0000 mg | Freq: Four times a day (QID) | INTRAMUSCULAR | Status: DC | PRN

## 2020-10-05 MED ORDER — STROKE: EARLY STAGES OF RECOVERY BOOK
Freq: Once | Status: AC
  Filled 2020-10-05: qty 1

## 2020-10-05 MED ORDER — ACETAMINOPHEN 325 MG PO TABS: 650.0000 mg | ORAL_TABLET | ORAL | Status: DC | PRN

## 2020-10-05 NOTE — ED Provider Notes (Signed)
[facility] EMERGENCY DEPARTMENT Provider Note   CSN: [id number] Arrival date & time: 10/05/20  1645     History No chief complaint on file.   Tanner Rice is a 87 y.o. male.  Patient is an 87 year old male with a history of rheumatoid arthritis on immunologic's, hypertension, thrombocytopenia and prior thoracic discitis who is presenting today as a code stroke with altered mental status.  Patient was his normal self at 1330 today when he drove himself to the grocery store.  Wife reported when he got back from the grocery store around 1600 he was not himself.  He seemed very drowsy and like he was having difficulty walking into the house.  He could only say yes and no questions.  She did give him an aspirin and by the time EMS arrived patient was no longer answering questions.  He was having difficulty moving any of his extremities.  In route patient stopped responding altogether and less he was getting a sternal rub.  He was unable to keep his eyes open and could not lift any of his extremities.  He only had garbled speech when EMS arrived.  Patient's sugar has been normal.  Oxygen saturation has been normal, heart rate was normal but he was having some PVCs.  Wife reported no new medications and denied any narcotic medications.  Here patient does not give any history.  The history is provided by the spouse and the EMS personnel. The history is limited by the condition of the patient.       Past Medical History:  Diagnosis Date  . Hypertension   . Melanoma ([place])    upper thigh on right leg  . Rheumatoid arthritis(714.0)   . Thoracic discitis     Patient Active Problem List   Diagnosis Date Noted  . Constipation 05/07/2016  . Protein-calorie malnutrition ([place]) 05/07/2016  . Ileus ([place])   . Hyperglycemia 01/30/2016  . Normocytic anemia 01/30/2016  . Thrombocytopenia ([place]) 01/30/2016  . Atherosclerotic peripheral vascular disease ([place]) 01/30/2016  .  Degenerative joint disease of spine 01/30/2016  . BPH (benign prostatic hyperplasia) 01/30/2016  . MRSA bacteremia   . Thoracic discitis   . Rheumatoid arthritis involving multiple sites with positive rheumatoid factor ([place]) 01/29/2016  . Essential hypertension 01/29/2016  . Hyponatremia 01/29/2016  . Left adrenal mass ([place]) 01/29/2016    Past Surgical History:  Procedure Laterality Date  . CATARACT EXTRACTION, BILATERAL    . COLONOSCOPY    . hole in retina surgery    . INGUINAL HERNIA REPAIR Left 02/09/2019   Procedure: LEFT INGUINAL HERNIA REPAIR WITH MESH;  Surgeon: Rolm Bookbinder, MD;  Location: [facility];  Service: General;  Laterality: Left;  GENERAL AND TAP BLOCK  . IR GENERIC HISTORICAL  04/24/2016   IR US GUIDE VASC ACCESS RIGHT 04/24/2016 Arne Cleveland, MD [facility]  . IR GENERIC HISTORICAL  04/24/2016   IR FLUORO GUIDE CV LINE RIGHT 04/24/2016 Arne Cleveland, MD [facility]  . TEE WITHOUT CARDIOVERSION N/A 02/02/2016   Procedure: TRANSESOPHAGEAL ECHOCARDIOGRAM (TEE);  Surgeon: Jerline Pain, MD;  Location: [facility] ENDOSCOPY;  Service: Cardiovascular;  Laterality: N/A;  . TONSILLECTOMY         Family History  Problem Relation Age of Onset  . Prostate cancer Father   . Prostate cancer Brother   . Osteoarthritis Brother     Social History   Tobacco Use  . Smoking status: Former Smoker    Years: 25.00  Types: Pipe  . Smokeless tobacco: Never Used  . Tobacco comment: quit 42 yrs. ago  Vaping Use  . Vaping Use: Never used  Substance Use Topics  . Alcohol use: Yes    Comment: 4x weel glass wine  . Drug use: No    Home Medications Prior to Admission medications   Medication Sig Start Date End Date Taking? Authorizing Provider  alfuzosin (UROXATRAL) 10 MG 24 hr tablet Take 10 mg by mouth every evening. 10/20/18   [provider]  amLODipine (NORVASC) 10 MG tablet Take 1 tablet (10 mg total) by mouth daily. 02/07/16   Kelvin Cellar, MD  calcium  citrate-vitamin D (CITRACAL+D) 315-200 MG-UNIT tablet Take 1 tablet by mouth 2 (two) times a week. Patient not taking: Reported on 02/10/2020    [provider]  cholecalciferol (VITAMIN D) 25 MCG (1000 UT) tablet Take 1,000 Units by mouth daily.    [provider]  docusate sodium (COLACE) 250 MG capsule Take 250 mg by mouth 2 (two) times daily.    [provider]  doxycycline (VIBRA-TABS) 100 MG tablet Take 1 tablet (100 mg total) by mouth 2 (two) times daily. 02/10/20   Michel Bickers, MD  Ferrous Sulfate 90 (18 Fe) MG TABS Take 1 tablet by mouth daily.    [provider]  folic acid (FOLVITE) 1 MG tablet Take 1 mg by mouth daily. 01/08/20   [provider]  golimumab ([place] ARIA) 50 MG/4ML SOLN injection Inject into the vein. 01/03/20   [provider]  lisinopril (ZESTRIL) 20 MG tablet Take 20 mg by mouth daily. 01/07/19   [provider]  Melatonin 5 MG TABS Take 5 mg by mouth at bedtime as needed (sleep).    [provider]  methotrexate (RHEUMATREX) 2.5 MG tablet Take 15 mg by mouth once a week. 01/31/20   [provider]  methylPREDNISolone (MEDROL DOSEPAK) 4 MG TBPK tablet TAKE 6 TABLETS BY MOUTH ON DAY 1, 5 TABLETS ON DAY 2, 4 TABLETS ON DAY 3, 3 TABLETS ON DAY 4, 2 ON DAY 5, 1 ON DAY 6 11/16/19   [provider]  metoprolol succinate (TOPROL-XL) 50 MG 24 hr tablet Take 1 tablet (50 mg total) by mouth daily. Take with or immediately following a meal. Patient taking differently: Take 50 mg by mouth every evening. Take with or immediately following a meal. 02/07/16   Kelvin Cellar, MD  Multiple Vitamin (MULTIVITAMIN WITH MINERALS) TABS tablet Take 1 tablet by mouth daily.    [provider]  naproxen sodium (ALEVE) 220 MG tablet Take 220 mg by mouth 2 (two) times daily as needed (pain.).    [provider]  sulfaSALAzine (AZULFIDINE) 500 MG tablet Take 1,000 mg by mouth 2 (two) times daily.     [provider]  traMADol (ULTRAM) 50 MG tablet Take 1 tablet (50 mg total) by mouth every 6 (six) hours as needed. Patient not taking: Reported on 02/10/2020 02/09/19   Rolm Bookbinder, MD    Allergies    Other and Penicillins cross reactors  Review of Systems   Review of Systems  Unable to perform ROS: Acuity of condition    Physical Exam Updated Vital Signs BP (!) 170/80 (BP Location: Right Arm)   Pulse 70   Temp 98.1 F (36.7 C) (Axillary)   Resp 19   Wt 73.2 kg   SpO2 99%   BMI 26.05 kg/m   Physical Exam Vitals and nursing note reviewed.  Constitutional:  General: He is not in acute distress.    Appearance: He is well-developed.     Comments: Lethargic and difficult to arouse  HENT:     Head: Normocephalic and atraumatic.  Eyes:     Conjunctiva/sclera: Conjunctivae normal.     Pupils: Pupils are equal, round, and reactive to light.     Comments: Pupils are 43mm bilaterally  Cardiovascular:     Rate and Rhythm: Normal rate and regular rhythm. Occasional extrasystoles are present.    Pulses: Normal pulses.     Heart sounds: No murmur heard.   Pulmonary:     Effort: Pulmonary effort is normal. No respiratory distress.     Breath sounds: Normal breath sounds. No wheezing or rales.  Abdominal:     General: There is no distension.     Palpations: Abdomen is soft.     Tenderness: There is no abdominal tenderness. There is no guarding or rebound.  Musculoskeletal:        General: No tenderness. Normal range of motion.     Cervical back: Normal range of motion and neck supple.  Skin:    General: Skin is warm and dry.     Capillary Refill: Capillary refill takes less than 2 seconds.     Findings: No erythema or rash.  Neurological:     Comments: Rare speech but when does speak is garbled.  Pronator drift bilaterally in the upper and lower ext.  3/5 strength in bilateral lower ext.  4/5 strength in bilateral upper ext.   Difficult to tell if there is  neglect     ED Results / Procedures / Treatments   Labs (all labs ordered are listed, but only abnormal results are displayed) Labs Reviewed  APTT - Abnormal; Notable for the following components:      Result Value   aPTT 39 (*)    All other components within normal limits  CBC - Abnormal; Notable for the following components:   RBC 3.80 (*)    Hemoglobin 12.8 (*)    HCT 37.7 (*)    All other components within normal limits  I-STAT CHEM 8, ED - Abnormal; Notable for the following components:   Hemoglobin 12.2 (*)    HCT 36.0 (*)    All other components within normal limits  CULTURE, BLOOD (ROUTINE X 2)  CULTURE, BLOOD (ROUTINE X 2)  URINE CULTURE  RESP PANEL BY RT-PCR (FLU A&B, COVID) ARPGX2  PROTIME-INR  DIFFERENTIAL  COMPREHENSIVE METABOLIC PANEL  LACTIC ACID, PLASMA  URINALYSIS, ROUTINE W REFLEX MICROSCOPIC  CBG MONITORING, ED  TROPONIN I (HIGH SENSITIVITY)    EKG None  Radiology CT HEAD CODE STROKE WO CONTRAST  Result Date: 10/05/2020 CLINICAL DATA:  Code stroke.  Aphasia, weakness EXAM: CT HEAD WITHOUT CONTRAST TECHNIQUE: Contiguous axial images were obtained from the base of the skull through the vertex without intravenous contrast. COMPARISON:  None. FINDINGS: Brain: No acute intracranial hemorrhage, mass effect, or edema. Gray-white differentiation is preserved. Prominence of the ventricles and sulci reflects generalized parenchymal volume loss. Patchy hypoattenuation in the supratentorial white matter is nonspecific but probably reflects mild to moderate chronic microvascular ischemic changes. Age indeterminate small vessel infarcts of the thalami and basal ganglia. There is no extra-axial collection. Vascular: No hyperdense vessel. There is intracranial atherosclerotic calcification at the skull base. Skull: Unremarkable. Sinuses/Orbits: No acute abnormality. Other: Mastoid air cells are clear. ASPECTS [facility] Stroke Program Early CT Score) - Ganglionic level  infarction (caudate, lentiform nuclei, internal capsule, insula,  M1-M3 cortex): 7 - Supraganglionic infarction (M4-M6 cortex): 3 Total score (0-10 with 10 being normal): 10 IMPRESSION: There is no acute intracranial hemorrhage or definite evidence of acute infarction. ASPECT score is 10. Age-indeterminate small vessel infarcts of the thalami and basal ganglia. Chronic microvascular ischemic changes. These results were communicated to Dr. Quinn Axe At 5:08 pm on 10/05/2020 by text page via the [facility] messaging system. Electronically Signed   By: Macy Mis M.D.   On: 10/05/2020 17:10   CT ANGIO HEAD CODE STROKE  Result Date: 10/05/2020 CLINICAL DATA:  Aphasia, weakness EXAM: CT ANGIOGRAPHY HEAD AND NECK TECHNIQUE: Multidetector CT imaging of the head and neck was performed using the standard protocol during bolus administration of intravenous contrast. Multiplanar CT image reconstructions and MIPs were obtained to evaluate the vascular anatomy. Carotid stenosis measurements (when applicable) are obtained utilizing NASCET criteria, using the distal internal carotid diameter as the denominator. CONTRAST:  56mL OMNIPAQUE IOHEXOL 350 MG/ML SOLN COMPARISON:  None. FINDINGS: CTA NECK Aortic arch: Great vessel origins are patent. Right carotid system: Patent. Mixed plaque along the proximal internal carotid causing minimal stenosis. Left carotid system: Patent. Primarily calcified plaque along the proximal internal carotid causing less than 50% stenosis. Vertebral arteries: Patent and codominant.  No stenosis. Skeleton: Advanced degenerative changes of the included spine. Osseous demineralization. Other neck: Unremarkable. Upper chest: No apical lung mass. Review of the MIP images confirms the above findings CTA HEAD Anterior circulation: Intracranial internal carotid arteries are patent with calcified plaque along the distal cavernous and supraclinoid portions with up to mild stenosis. Anterior and middle cerebral  arteries are patent. Posterior circulation: Intracranial vertebral arteries are patent with minimal calcified plaque. Basilar artery is patent. Major cerebellar artery origins are patent. Posterior cerebral arteries are patent. Venous sinuses: Patent as allowed by contrast bolus timing. Review of the MIP images confirms the above findings IMPRESSION: No large vessel occlusion. Plaque at the left greater than right ICA origins without hemodynamically significant stenosis. These results were communicated to Dr. Quinn Axe at 5:08 pmon 10/05/2020 by text page via the [facility] messaging system. Electronically Signed   By: Macy Mis M.D.   On: 10/05/2020 17:18   CT ANGIO NECK CODE STROKE  Result Date: 10/05/2020 CLINICAL DATA:  Aphasia, weakness EXAM: CT ANGIOGRAPHY HEAD AND NECK TECHNIQUE: Multidetector CT imaging of the head and neck was performed using the standard protocol during bolus administration of intravenous contrast. Multiplanar CT image reconstructions and MIPs were obtained to evaluate the vascular anatomy. Carotid stenosis measurements (when applicable) are obtained utilizing NASCET criteria, using the distal internal carotid diameter as the denominator. CONTRAST:  22mL OMNIPAQUE IOHEXOL 350 MG/ML SOLN COMPARISON:  None. FINDINGS: CTA NECK Aortic arch: Great vessel origins are patent. Right carotid system: Patent. Mixed plaque along the proximal internal carotid causing minimal stenosis. Left carotid system: Patent. Primarily calcified plaque along the proximal internal carotid causing less than 50% stenosis. Vertebral arteries: Patent and codominant.  No stenosis. Skeleton: Advanced degenerative changes of the included spine. Osseous demineralization. Other neck: Unremarkable. Upper chest: No apical lung mass. Review of the MIP images confirms the above findings CTA HEAD Anterior circulation: Intracranial internal carotid arteries are patent with calcified plaque along the distal cavernous and  supraclinoid portions with up to mild stenosis. Anterior and middle cerebral arteries are patent. Posterior circulation: Intracranial vertebral arteries are patent with minimal calcified plaque. Basilar artery is patent. Major cerebellar artery origins are patent. Posterior cerebral arteries are patent. Venous sinuses: Patent as allowed  by contrast bolus timing. Review of the MIP images confirms the above findings IMPRESSION: No large vessel occlusion. Plaque at the left greater than right ICA origins without hemodynamically significant stenosis. These results were communicated to Dr. Quinn Axe at 5:08 pmon 10/05/2020 by text page via the [facility] messaging system. Electronically Signed   By: Macy Mis M.D.   On: 10/05/2020 17:18    Procedures Procedures   Medications Ordered in ED Medications  sodium chloride flush (NS) 0.9 % injection 3 mL (has no administration in time range)    ED Course  I have reviewed the triage vital signs and the nursing notes.  Pertinent labs & imaging results that were available during my care of the patient were reviewed by me and considered in my medical decision making (see chart for details).    MDM Rules/Calculators/A&P                          Patient is an 87 year old male presenting today as a code stroke.  Patient was last normal at 1330 seen by his wife when he drove himself to the grocery store.  When he got home at 1600 wife reported he was not acting himself.  Upon arrival to the emergency room patient is very lethargic.  Difficult to arouse and difficulty even holding his eyes open.  Weakness noted in bilateral lower extremities and drift present in bilateral upper extremities.  No localizing features at this time.  There was no report of patient taking any narcotic or mind altering medication.  No report of him having any recent illness or medication changes.  Patient's oxygen saturation is 98% on room air.  He is hypertensive at 170/80 and heart rate is  normal.  Concern for possible head bleed versus medication reaction.  Patient's blood sugar is within normal limits.  CBC within normal limits except for hemoglobin of 12.  INR is within normal limits.  CMP without acute findings.  Head CT without acute findings.  CTA without large vessel occlusion.  5:30 PM On repeat evaluation going back into the room patient is now shaking diffusely.  He is intermittently saying 1-2 words.  He does say shaking and cold.  Concern for possible early infectious etiology and encephalopathy.  Speaking with neurology who is at bedside they do not feel that he is a tPA candidate at this time because he has no focal symptoms consistent with definitive stroke.  They will set up EEG to ensure no other acute underlying issues.  Rectal temperature with a disposable probe reports 97.  Patient is more responsive now and do not feel that he needs Narcan as this is most likely not narcotics.  A salicylate, ammonia, EtOH level are pending for encephalopathy and altered mental status.  Also and infectious work-up was initiated as patient is immune compromised and is on methotrexate but may also be on an immunologic medication.  7:11 PM Labs are reassuring.  On reentering the room patient is now looking around and able to speak in more complete sentences and seems almost back to his baseline.  He is currently getting an EEG.  Will admit for altered level of consciousness and ongoing observation.  No distinct sign of sepsis at this time.  MDM Number of Diagnoses or Management Options   Amount and/or Complexity of Data Reviewed Clinical lab tests: ordered and reviewed Tests in the radiology section of CPT: ordered and reviewed Tests in the medicine section of  CPT: ordered and reviewed Decide to obtain previous medical records or to obtain history from someone other than the patient: yes Obtain history from someone other than the patient: yes Review and summarize past medical  records: yes Discuss the patient with other providers: yes Independent visualization of images, tracings, or specimens: yes  Risk of Complications, Morbidity, and/or Mortality Presenting problems: high Diagnostic procedures: high Management options: high  Patient Progress Patient progress: improved    Final Clinical Impression(s) / ED Diagnoses Final diagnoses:  Altered mental status, unspecified altered mental status type    Rx / DC Orders ED Discharge Orders    None       Blanchie Dessert, MD 10/05/20 [id number]

## 2020-10-05 NOTE — ED Triage Notes (Signed)
Code stroke arrived at [facility].

## 2020-10-05 NOTE — Procedures (Signed)
Patient Name: DELLAS GUARD  MRN: [id number]  Epilepsy Attending: Lora Havens  Referring Physician/Provider: Dr Su Monks Date: 10/05/2020 Duration: 21.36 mins  Patient history: 87 yo man on immunomodulation for RA and hx recurrent infection incl MRSA bacteremia 2017 c/b thoracic discitis p/w acute onset encephalopathy with nonfocal exam. EEG to evaluate for seizure   Level of alertness: Awake  AEDs during EEG study: None  Technical aspects: This EEG study was done with scalp electrodes positioned according to the 10-20 International system of electrode placement. Electrical activity was acquired at a sampling rate of 500Hz  and reviewed with a high frequency filter of 70Hz  and a low frequency filter of 1Hz . EEG data were recorded continuously and digitally stored.   Description: The posterior dominant rhythm consists of 8-9 Hz activity of moderate voltage (25-35 uV) seen predominantly in posterior head regions, symmetric and reactive to eye opening and eye closing.  Physiologic photic driving was seen during photic stimulation. Hyperventilation was not performed.     IMPRESSION: This study is within normal limits. No seizures or epileptiform discharges were seen throughout the recording.   Kanon Novosel Barbra Sarks

## 2020-10-05 NOTE — H&P (Addendum)
History and Physical    THI SISEMORE MVH:[id number] DOB: [birth year] DOA: 10/05/2020  PCP: Lajean Manes, MD  Patient coming from: Home via EMS   Chief Complaint:  Chief Complaint  Patient presents with  . 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  Patient is a somewhat poor historian due to transient confusion and lethargy during the episode.  I have attempted to contact the wife via listed phone numbers but she has not answered any of them.  Majority the history is been obtained from both the patient, discussions with the emergency department provider as well as review of neurology notes.  Patient's last known normal was approximately 1:30 PM.  At that time, the patient was planning on going to the store to purchase a new cell phone.  Patient explains that as he was driving he "got lost."  He reports that he was never able to make it to the store and after 2 and half hours he eventually made it back home.  Upon attempting to get back into his home he noticed that he was extremely weak and shaky and due to the shaking had significant difficulty using his keys to his house.  Upon entering his home patient was visibly weak, having difficulty with ambulating and "not acting right" according to the wife.  Patient would only answer "yes and no" questions.  After having the patient sit down wife provided the patient with 81 mg of aspirin and call 911.    Upon EMS arrival, the appreciated was seen to be aphasia and generalized weakness and activated a code stroke.  Was promptly brought into [facility] emergency room for evaluation.  It is worth noting that in route,  patient's lethargy and decreased responsiveness seemed to transiently worsen.  Upon evaluation in the emergency department patient was promptly evaluated by Dr. Quinn Axe with neurology.  Upon Dr. Artemio Aly evaluation, patient's symptoms seemd to be rapidly improving and tPA was not given.  Stat noncontrast CT of the head was performed revealing no evidence of hemorrhage or obvious acute infarct.  CT angiogram of the head and neck revealed no obvious large vessel occlusion.  Based on this initial evaluation, neurology recommended encephalopathy work-up, stat EEG and MRI brain with and without contrast.  The hospitalist group was then called to assess the patient for admission to the hospital.    Review of Systems:   Review of Systems  Constitutional: Positive for malaise/fatigue.  Neurological: Positive for speech change and weakness.  All other systems reviewed and are negative.   Past Medical History:  Diagnosis Date  . Hypertension   . Melanoma ([place])    upper thigh on right leg  . Rheumatoid arthritis(714.0)   . Thoracic discitis     Past Surgical History:  Procedure Laterality Date  . CATARACT EXTRACTION, BILATERAL    . COLONOSCOPY    . hole in retina surgery    . INGUINAL HERNIA REPAIR Left 02/09/2019   Procedure: LEFT INGUINAL HERNIA REPAIR WITH MESH;  Surgeon: Rolm Bookbinder, MD;  Location: [facility];  Service: General;  Laterality: Left;  GENERAL AND TAP BLOCK  . IR GENERIC HISTORICAL  04/24/2016   IR US GUIDE VASC ACCESS RIGHT 04/24/2016  Arne Cleveland, MD [facility]-INTERV RAD  . IR GENERIC HISTORICAL  04/24/2016   IR FLUORO GUIDE CV LINE RIGHT 04/24/2016 Arne Cleveland, MD [facility]INTER[facility]  . TEE WITHOUT CARDIOVERSION N/A 02/02/2016   Procedure: TRANSESOPHAGEAL ECHOCARDIOGRAM (TEE);  Surgeon: Jerline Pain, MD;  Location: [facility] ENDOSCOPY;  Service: Cardiovascular;  Laterality: N/A;  . TONSILLECTOMY       reports that he has quit smoking. His smoking use included pipe. He quit after 25.00  years of use. He has never used smokeless tobacco. He reports current alcohol use. He reports that he does not use drugs.  Allergies  Allergen Reactions  . Other Diarrhea, Nausea And Vomiting and Other (See Comments)    Seafood - mussels   . Penicillins Cross Reactors Hives    Did it involve swelling of the face/tongue/throat, SOB, or low BP? Unknown Did it involve sudden or severe rash/hives, skin peeling, or any reaction on the inside of your mouth or nose? Unknown Did you need to seek medical attention at a hospital or doctor's office? Yes When did it last happen?over 65 years ago If all above answers are "NO", may proceed with cephalosporin use. .    Family History  Problem Relation Age of Onset  . Prostate cancer Father   . Prostate cancer Brother   . Osteoarthritis Brother      Prior to Admission medications   Medication Sig Start Date End Date Taking? Authorizing Provider  alfuzosin (UROXATRAL) 10 MG 24 hr tablet Take 10 mg by mouth every evening. 10/20/18   [provider]  amLODipine (NORVASC) 10 MG tablet Take 1 tablet (10 mg total) by mouth daily. 02/07/16   Kelvin Cellar, MD  calcium citrate-vitamin D (CITRACAL+D) 315-200 MG-UNIT tablet Take 1 tablet by mouth 2 (two) times a week. Patient not taking: Reported on 02/10/2020    [provider]  cholecalciferol (VITAMIN D) 25 MCG (1000 UT) tablet Take 1,000 Units by mouth daily.    [provider]  docusate sodium (COLACE) 250 MG capsule Take 250 mg by mouth 2 (two) times daily.    [provider]  doxycycline (VIBRA-TABS) 100 MG tablet Take 1 tablet (100 mg total) by mouth 2 (two) times daily. 02/10/20   Michel Bickers, MD  Ferrous Sulfate 90 (18 Fe) MG TABS Take 1 tablet by mouth daily.    [provider]  folic acid (FOLVITE) 1 MG tablet Take 1 mg by mouth daily. 01/08/20   [provider]  golimumab ([facility] ARIA) 50 MG/4ML SOLN injection Inject into the vein.  01/03/20   [provider]  lisinopril (ZESTRIL) 20 MG tablet Take 20 mg by mouth daily. 01/07/19   [provider]  Melatonin 5 MG TABS Take 5 mg by mouth at bedtime as needed (sleep).    [provider]  methotrexate (RHEUMATREX) 2.5 MG tablet Take 15 mg by mouth once a week. 01/31/20   [provider]  methylPREDNISolone (MEDROL DOSEPAK) 4 MG TBPK tablet TAKE 6 TABLETS BY MOUTH ON DAY 1, 5 TABLETS ON DAY 2, 4 TABLETS ON DAY 3, 3 TABLETS ON DAY 4, 2 ON DAY 5, 1 ON DAY 6 11/16/19   [provider]  metoprolol succinate (TOPROL-XL) 50 MG 24 hr tablet Take 1 tablet (50 mg total) by mouth daily. Take with or immediately following a meal. Patient taking differently: Take 50 mg by mouth every evening. Take with or immediately following a meal. 02/07/16   Kelvin Cellar, MD  Multiple Vitamin (  MULTIVITAMIN WITH MINERALS) TABS tablet Take 1 tablet by mouth daily.    [provider]  naproxen sodium (ALEVE) 220 MG tablet Take 220 mg by mouth 2 (two) times daily as needed (pain.).    [provider]  sulfaSALAzine (AZULFIDINE) 500 MG tablet Take 1,000 mg by mouth 2 (two) times daily.    [provider]  traMADol (ULTRAM) 50 MG tablet Take 1 tablet (50 mg total) by mouth every 6 (six) hours as needed. Patient not taking: Reported on 02/10/2020 02/09/19   Rolm Bookbinder, MD    Physical Exam: Vitals:   10/05/20 1915 10/05/20 1930 10/05/20 1945 10/05/20 1945  BP: 138/77 (!) 155/79 (!) 149/77   Pulse: 66 72 65   Resp: 15 17 18    Temp:    98.2 F (36.8 C)  TempSrc:    Oral  SpO2: 98% 99% 99%   Weight:        Constitutional: Patient is lethargic but arousable and oriented x3.  no associated distress.   Skin: no rashes, no lesions, poor skin turgor noted.  Skin is seemingly flushed. Eyes: Pupils are somewhat constricted but reactive to light.  No evidence of scleral icterus or conjunctival pallor.  ENMT: Dry mucous membranes noted.   Posterior pharynx clear of any exudate or lesions.   Neck: normal, supple, no masses, no thyromegaly.  No evidence of jugular venous distension.   Respiratory: clear to auscultation bilaterally, no wheezing, no crackles. Normal respiratory effort. No accessory muscle use.  Cardiovascular: Regular rate and rhythm, no murmurs / rubs / gallops. No extremity edema. 2+ pedal pulses. No carotid bruits.  Chest:   Nontender without crepitus or deformity.   Back:   Nontender without crepitus or deformity. Abdomen: Abdomen is soft and nontender.  No evidence of intra-abdominal masses.  Positive bowel sounds noted in all quadrants.   Musculoskeletal: No joint deformity upper and lower extremities. Good ROM, no contractures. Normal muscle tone.  Neurologic: Patient is notably lethargic but arousable and oriented x3.  Patient is moving all 4 extremities spontaneously but exhibits generalized weakness with strength 4 out of 5 in proximal and distal muscle groups.  Notably tremulous with movement.  CN 2-12 grossly intact. Sensation intact.   Patient is following all commands.  Patient is responsive to verbal stimuli.   Psychiatric: Patient exhibits depressed mood with flat affect patient seems to possess insight as to their current situation.     Labs on Admission: I have personally reviewed following labs and imaging studies -   CBC: Recent Labs  Lab 10/05/20 1647 10/05/20 1655  WBC 6.9  --   NEUTROABS 4.9  --   HGB 12.8* 12.2*  HCT 37.7* 36.0*  MCV 99.2  --   PLT 186  --    Basic Metabolic Panel: Recent Labs  Lab 10/05/20 1647 10/05/20 1655  NA 135 138  K 3.7 3.8  CL 104 105  CO2 23  --   GLUCOSE 100* 99  BUN 15 18  CREATININE 0.77 0.70  CALCIUM 9.9  --    GFR: CrCl cannot be calculated (Unknown ideal weight.). Liver Function Tests: Recent Labs  Lab 10/05/20 1647  AST 26  ALT 17  ALKPHOS 64  BILITOT 0.5  PROT 6.6  ALBUMIN 4.1   No results for input(s): LIPASE, AMYLASE in the  last 168 hours. Recent Labs  Lab 10/05/20 1732  AMMONIA 25   Coagulation Profile: Recent Labs  Lab 10/05/20 1647  INR 1.1  Cardiac Enzymes: No results for input(s): CKTOTAL, CKMB, CKMBINDEX, TROPONINI in the last 168 hours. BNP (last 3 results) No results for input(s): PROBNP in the last 8760 hours. HbA1C: No results for input(s): HGBA1C in the last 72 hours. CBG: No results for input(s): GLUCAP in the last 168 hours. Lipid Profile: No results for input(s): CHOL, HDL, LDLCALC, TRIG, CHOLHDL, LDLDIRECT in the last 72 hours. Thyroid Function Tests: No results for input(s): TSH, T4TOTAL, FREET4, T3FREE, THYROIDAB in the last 72 hours. Anemia Panel: No results for input(s): VITAMINB12, FOLATE, FERRITIN, TIBC, IRON, RETICCTPCT in the last 72 hours. Urine analysis:    Component Value Date/Time   COLORURINE YELLOW 10/05/2020 1729   APPEARANCEUR CLEAR 10/05/2020 1729   LABSPEC 1.021 10/05/2020 1729   PHURINE 8.0 10/05/2020 1729   GLUCOSEU NEGATIVE 10/05/2020 1729   HGBUR NEGATIVE 10/05/2020 1729   BILIRUBINUR NEGATIVE 10/05/2020 1729   KETONESUR NEGATIVE 10/05/2020 1729   PROTEINUR NEGATIVE 10/05/2020 1729   NITRITE NEGATIVE 10/05/2020 1729   LEUKOCYTESUR NEGATIVE 10/05/2020 1729    Radiological Exams on Admission - Personally Reviewed: EEG adult  Result Date: 10/05/2020 Lora Havens, MD     10/05/2020  7:36 PM Patient Name: HALO WILTFONG MRN: [id number] Epilepsy Attending: Lora Havens Referring Physician/Provider: Dr Su Monks Date: 10/05/2020 Duration: 21.36 mins Patient history: 87 yo man on immunomodulation for RA and hx recurrent infection incl MRSA bacteremia 2017 c/b thoracic discitis p/w acute onset encephalopathy with nonfocal exam. EEG to evaluate for seizure  Level of alertness: Awake AEDs during EEG study: None Technical aspects: This EEG study was done with scalp electrodes positioned according to the 10-20 International system of electrode placement.  Electrical activity was acquired at a sampling rate of 500Hz  and reviewed with a high frequency filter of 70Hz  and a low frequency filter of 1Hz . EEG data were recorded continuously and digitally stored. Description: The posterior dominant rhythm consists of 8-9 Hz activity of moderate voltage (25-35 uV) seen predominantly in posterior head regions, symmetric and reactive to eye opening and eye closing.  Physiologic photic driving was seen during photic stimulation. Hyperventilation was not performed.   IMPRESSION: This study is within normal limits. No seizures or epileptiform discharges were seen throughout the recording. Lora Havens   CT HEAD CODE STROKE WO CONTRAST  Result Date: 10/05/2020 CLINICAL DATA:  Code stroke.  Aphasia, weakness EXAM: CT HEAD WITHOUT CONTRAST TECHNIQUE: Contiguous axial images were obtained from the base of the skull through the vertex without intravenous contrast. COMPARISON:  None. FINDINGS: Brain: No acute intracranial hemorrhage, mass effect, or edema. Gray-white differentiation is preserved. Prominence of the ventricles and sulci reflects generalized parenchymal volume loss. Patchy hypoattenuation in the supratentorial white matter is nonspecific but probably reflects mild to moderate chronic microvascular ischemic changes. Age indeterminate small vessel infarcts of the thalami and basal ganglia. There is no extra-axial collection. Vascular: No hyperdense vessel. There is intracranial atherosclerotic calcification at the skull base. Skull: Unremarkable. Sinuses/Orbits: No acute abnormality. Other: Mastoid air cells are clear. ASPECTS ([place] Stroke Program Early CT Score) - Ganglionic level infarction (caudate, lentiform nuclei, internal capsule, insula, M1-M3 cortex): 7 - Supraganglionic infarction (M4-M6 cortex): 3 Total score (0-10 with 10 being normal): 10 IMPRESSION: There is no acute intracranial hemorrhage or definite evidence of acute infarction. ASPECT score is  10. Age-indeterminate small vessel infarcts of the thalami and basal ganglia. Chronic microvascular ischemic changes. These results were communicated to Dr. Quinn Axe At 5:08 pm on 10/05/2020 by text page  via the [employer] system. Electronically Signed   By: Macy Mis M.D.   On: 10/05/2020 17:10   CT ANGIO HEAD CODE STROKE  Result Date: 10/05/2020 CLINICAL DATA:  Aphasia, weakness EXAM: CT ANGIOGRAPHY HEAD AND NECK TECHNIQUE: Multidetector CT imaging of the head and neck was performed using the standard protocol during bolus administration of intravenous contrast. Multiplanar CT image reconstructions and MIPs were obtained to evaluate the vascular anatomy. Carotid stenosis measurements (when applicable) are obtained utilizing NASCET criteria, using the distal internal carotid diameter as the denominator. CONTRAST:  19mL OMNIPAQUE IOHEXOL 350 MG/ML SOLN COMPARISON:  None. FINDINGS: CTA NECK Aortic arch: Great vessel origins are patent. Right carotid system: Patent. Mixed plaque along the proximal internal carotid causing minimal stenosis. Left carotid system: Patent. Primarily calcified plaque along the proximal internal carotid causing less than 50% stenosis. Vertebral arteries: Patent and codominant.  No stenosis. Skeleton: Advanced degenerative changes of the included spine. Osseous demineralization. Other neck: Unremarkable. Upper chest: No apical lung mass. Review of the MIP images confirms the above findings CTA HEAD Anterior circulation: Intracranial internal carotid arteries are patent with calcified plaque along the distal cavernous and supraclinoid portions with up to mild stenosis. Anterior and middle cerebral arteries are patent. Posterior circulation: Intracranial vertebral arteries are patent with minimal calcified plaque. Basilar artery is patent. Major cerebellar artery origins are patent. Posterior cerebral arteries are patent. Venous sinuses: Patent as allowed by contrast bolus timing.  Review of the MIP images confirms the above findings IMPRESSION: No large vessel occlusion. Plaque at the left greater than right ICA origins without hemodynamically significant stenosis. These results were communicated to Dr. Quinn Axe at 5:08 pmon 10/05/2020 by text page via the [employer] messaging system. Electronically Signed   By: Macy Mis M.D.   On: 10/05/2020 17:18   CT ANGIO NECK CODE STROKE  Result Date: 10/05/2020 CLINICAL DATA:  Aphasia, weakness EXAM: CT ANGIOGRAPHY HEAD AND NECK TECHNIQUE: Multidetector CT imaging of the head and neck was performed using the standard protocol during bolus administration of intravenous contrast. Multiplanar CT image reconstructions and MIPs were obtained to evaluate the vascular anatomy. Carotid stenosis measurements (when applicable) are obtained utilizing NASCET criteria, using the distal internal carotid diameter as the denominator. CONTRAST:  64mL OMNIPAQUE IOHEXOL 350 MG/ML SOLN COMPARISON:  None. FINDINGS: CTA NECK Aortic arch: Great vessel origins are patent. Right carotid system: Patent. Mixed plaque along the proximal internal carotid causing minimal stenosis. Left carotid system: Patent. Primarily calcified plaque along the proximal internal carotid causing less than 50% stenosis. Vertebral arteries: Patent and codominant.  No stenosis. Skeleton: Advanced degenerative changes of the included spine. Osseous demineralization. Other neck: Unremarkable. Upper chest: No apical lung mass. Review of the MIP images confirms the above findings CTA HEAD Anterior circulation: Intracranial internal carotid arteries are patent with calcified plaque along the distal cavernous and supraclinoid portions with up to mild stenosis. Anterior and middle cerebral arteries are patent. Posterior circulation: Intracranial vertebral arteries are patent with minimal calcified plaque. Basilar artery is patent. Major cerebellar artery origins are patent. Posterior cerebral arteries are  patent. Venous sinuses: Patent as allowed by contrast bolus timing. Review of the MIP images confirms the above findings IMPRESSION: No large vessel occlusion. Plaque at the left greater than right ICA origins without hemodynamically significant stenosis. These results were communicated to Dr. Quinn Axe at 5:08 pmon 10/05/2020 by text page via the [facility] messaging system. Electronically Signed   By: Macy Mis M.D.   On: 10/05/2020  17:18    EKG: Personally reviewed.  Rhythm is rhythm with heart rate of 69 bpm.  No dynamic ST segment changes appreciated.  Assessment/Plan Principal Problem:   Altered mental status   Patient having a several hour episode of severe weakness, tremulousness, transient aphasia and transient confusion.  To exam has been nonfocal, I feel stroke is unlikely.  Nonconvulsive seizure activity is also possible as well as a potential for a brewing infectious process in this patient who is immunocompromised on immune modulating agents for rheumatoid arthritis with remote history of MRSA bacteremia and discitis.    Per neurology recommendations proceeding with MRI brain, EEG and thorough work-up for potential infectious causes including urinalysis, blood cultures, urine culture, chest x-ray  Serial neurologic checks  Monitoring patient on telemetry  Permissive hypertension for now until stroke is ruled out via MRI  PT, OT, SLP evaluation  Active Problems:   Rheumatoid arthritis involving multiple sites with positive rheumatoid factor ([place])   Pertinent history due to the fact the patient is on chronic use immunomodulating agents including Methotrexate and Simponi with rheumatology place the patient at risk of an insidious infectious process  Continuing home regimen of methotrexate for now    Essential hypertension   Temporarily holding scheduled oral antihypertensives until MRI rules out stroke  As needed Intravenous) for markedly low blood pressures.  History of  MRSA bacteremia and thoracic discitis   Presence of history of MRSA bacteremia in the setting of immunocompromise state  Continuing home regimen of suppressive doxycycline therapy for now.    BPH (benign prostatic hyperplasia)   Continue home regimen of alfuzosin   Code Status:  Full code Family Communication: Multiple attempts have been made to contact the wife via the listed phone numbers but I have been unsuccessful.  Status is: Observation  The patient remains OBS appropriate and will d/c before 2 midnights.  Dispo: The patient is from: Home              Anticipated d/c is to: Home              Patient currently is not medically stable to d/c.   Difficult to place patient No        Vernelle Emerald MD [facility] Pager [phone]  If 7PM-7AM, please contact night-coverage [email or web address] Use universal [place] password for that web site. If you do not have the password, please call the hospital operator.  10/05/2020, 8:20 PM

## 2020-10-05 NOTE — Consult Note (Signed)
NEUROLOGY CONSULTATION NOTE   Date of service: October 05, 2020 Patient Name: Tanner Rice MRN:  [id number] DOB:  01/01/1934 Reason for consult: stroke code for aphasia and generalized weakness _ _ _   _ __   _ __ _ _  __ __   _ __   __ _  History of Present Illness   Tanner Rice is a 87 y.o. male with PMH significant for  has a past medical history of Hypertension, Melanoma ([place]), Rheumatoid arthritis(714.0), and Thoracic discitis. who presented as a stroke code for encephalopathy versus aphasia and generalized weakness.  Last known well was 1330 today when patient left his wife at home and drove himself to the store.  He drove himself back from the store at 4 PM but had difficulty putting his keys in the lock and was tremulous.  He seemed to be leaving leaning to the right although he was ambulating he was unsteady on his feet.  She he was not responding appropriately to her but would say yes or no only to questions.  She gave him 81 mg of aspirin and called 911.  EMS arrived and activated the stroke code in the field due to concern for aphasia and generalized weakness.  Blood pressure was 178/98 and CBG was 98.  Patient became less responsive on the way to the hospital and would only respond to painful stimuli.  Upon arrival to the ED he underwent CT head which showed no acute intracranial process.  Stroke scale was initially of 14 although nonfocal in route was rapidly improving.  He became more lucid and was able to respond to questions appropriately but was observed to have tremulousness that appeared to be consistent with rigors.  Patient is on monoclonal antibody infusion for RA.  He has had a history of multiple infections in the past including MRSA bacteremia and thoracic discitis in 2017.  He is followed by ID most recently in 2021 and is on prophylactic doxycycline.  He is no known history of seizures.  He is on tramadol.  CTA head and neck showed no large vessel occlusion.  Due to patient's  nonfocal exam and improving symptoms tPA was not administered because stroke was thought to be less likely etiology of his encephalopathy.    ROS   UTA 2/2 encephalopathy  Past History   Past Medical History:  Diagnosis Date  . Hypertension   . Melanoma ([place])    upper thigh on right leg  . Rheumatoid arthritis(714.0)   . Thoracic discitis    Past Surgical History:  Procedure Laterality Date  . CATARACT EXTRACTION, BILATERAL    . COLONOSCOPY    . hole in retina surgery    . INGUINAL HERNIA REPAIR Left 02/09/2019   Procedure: LEFT INGUINAL HERNIA REPAIR WITH MESH;  Surgeon: Rolm Bookbinder, MD;  Location: [facility];  Service: General;  Laterality: Left;  GENERAL AND TAP BLOCK  . IR GENERIC HISTORICAL  04/24/2016   IR US GUIDE VASC ACCESS RIGHT 04/24/2016 Arne Cleveland, MD [facility]  . IR GENERIC HISTORICAL  04/24/2016   IR FLUORO GUIDE CV LINE RIGHT 04/24/2016 Arne Cleveland, MD [facility]  . TEE WITHOUT CARDIOVERSION N/A 02/02/2016   Procedure: TRANSESOPHAGEAL ECHOCARDIOGRAM (TEE);  Surgeon: Jerline Pain, MD;  Location: [facility] ENDOSCOPY;  Service: Cardiovascular;  Laterality: N/A;  . TONSILLECTOMY     Family History  Problem Relation Age of Onset  . Prostate cancer Father   . Prostate cancer Brother   .  Osteoarthritis Brother    Social History   Socioeconomic History  . Marital status: Married    Spouse name: Not on file  . Number of children: Not on file  . Years of education: Not on file  . Highest education level: Not on file  Occupational History  . Not on file  Tobacco Use  . Smoking status: Former Smoker    Years: 25.00    Types: Pipe  . Smokeless tobacco: Never Used  . Tobacco comment: quit 42 yrs. ago  Vaping Use  . Vaping Use: Never used  Substance and Sexual Activity  . Alcohol use: Yes    Comment: 4x weel glass wine  . Drug use: No  . Sexual activity: Not on file  Other Topics Concern  . Not on file  Social History Narrative  . Not on file    Social Determinants of Health   Financial Resource Strain: Not on file  Food Insecurity: Not on file  Transportation Needs: Not on file  Physical Activity: Not on file  Stress: Not on file  Social Connections: Not on file   Allergies  Allergen Reactions  . Other Diarrhea, Nausea And Vomiting and Other (See Comments)    Seafood - mussels   . Penicillins Cross Reactors Hives    Did it involve swelling of the face/tongue/throat, SOB, or low BP? Unknown Did it involve sudden or severe rash/hives, skin peeling, or any reaction on the inside of your mouth or nose? Unknown Did you need to seek medical attention at a hospital or doctor's office? Yes When did it last happen?over 65 years ago If all above answers are "NO", may proceed with cephalosporin use. .    Medications   (Not in a hospital admission)    Vitals   Vitals:   10/05/20 1740 10/05/20 1745 10/05/20 1800 10/05/20 1815  BP:  (!) 151/110 (!) 157/82 (!) 174/82  Pulse:  76 66 68  Resp:  (!) 24 18 20   Temp: 97.9 F (36.6 C)     TempSrc: Rectal     SpO2:  100% 100% 100%  Weight:         Body mass index is 26.05 kg/m.  Physical Exam   Physical Exam Gen: drowsy but awake, initially unable to answer orientation questions beyond first name (correct). Later was oriented to hospital and wife. Follows simple commands. HEENT: Atraumatic, normocephalic Resp: CTAB CV: RRR Abd: soft/NT/ND Extrem: Nml bulk; no cyanosis, clubbing, or edema.  Neuro: *MS: drowsy but awake, initially unable to answer orientation questions beyond first name (correct). Later was oriented to hospital and wife. Follows simple commands. *Speech: mild dysarthria, sparse speech with stuttering, improved over course of examination. Able to repeat. Some word substitutions (named "toe" for finger) *CN: PERRL, blinks to threat bilat, tracks examiner with apparently full EOM, sensation intact, face symmetric, hearing intact to voice *Motor:    Normal bulk.  Symmetric mild drift BUE, no movement against gravity BLE initially then some movement against gravity bilat, symmetric. *Sensory: SILT *Coordination, gait:  UTA* Reflexes:  2+ and symmetric throughout without clonus; toes down-going bilat   NIHSS  1a Level of Conscious.: 1 1b LOC Questions: 2 1c LOC Commands: 0 2 Best Gaze: 0 3 Visual: 0 4 Facial Palsy: 0 5a Motor Arm - left: 1 5b Motor Arm - Right: 1 6a Motor Leg - Left: 3 6b Motor Leg - Right: 3 7 Limb Ataxia: 0 8 Sensory: 0 9 Best Language: 2 10 Dysarthria: 1  11 Extinct. and Inatten.: 0  TOTAL: 14   Premorbid mRS = 2   Labs   CBC:  Recent Labs  Lab 10/05/20 1647 10/05/20 1655  WBC 6.9  --   NEUTROABS 4.9  --   HGB 12.8* 12.2*  HCT 37.7* 36.0*  MCV 99.2  --   PLT 186  --     Basic Metabolic Panel:  Lab Results  Component Value Date   NA 138 10/05/2020   K 3.8 10/05/2020   CO2 23 10/05/2020   GLUCOSE 99 10/05/2020   BUN 18 10/05/2020   CREATININE 0.70 10/05/2020   CALCIUM 9.9 10/05/2020   GFRNONAA >60 10/05/2020   GFRAA >60 02/03/2019   Lipid Panel: No results found for: LDLCALC HgbA1c: No results found for: HGBA1C Urine Drug Screen: No results found for: LABOPIA, COCAINSCRNUR, LABBENZ, AMPHETMU, THCU, LABBARB  Alcohol Level No results found for: ETH   Impression   87 yo man on immunomodulation for RA and hx recurrent infection incl MRSA bacteremia 2017 c/b thoracic discitis p/w acute onset encephalopathy with nonfocal exam  Recommendations   - CMP, CBC, TSH, UA, CXR, B12, blood cultures pending - STAT EEG - MRI brain wwo when able to lay still - Further w/u to be considered based on above results and repeat neurologic examination tomorrow  Will continue to follow. ______________________________________________________________________   Thank you for the opportunity to take part in the care of this patient. If you have any further questions, please contact the neurology  consultation attending.  Signed,  Su Monks, MD [facility] [phone]  If 7pm- 7am, please page neurology on call as listed in [place].

## 2020-10-05 NOTE — Code Documentation (Signed)
Patient lives at home with his wife. He was LKW at 1330 when he left for the store. When patient returned home at 1600 his wife noticed he was unsteady on his feet. He was leaning to the right and was not verbally responding to her. He would nod yes and no. She gave him 62m of ASA and called 911. GEMS arrived and activated the code stroke after assessing aphasia and generalized weakness. BP 178/98 and CBG 98.  Pt became less responsive on the way to the hospital and would only respond to painful stimuli. [facility]. He was cleared by the EDP for CT. Pt taken for CT/CTA. Results below. Pt started to become more responsive after scans. His initial NIHSS was a 14 but has since gone down to a 10. See documentation for stroke specifics. Symptoms have been quickly improving with his exam. Wife is now at the bedside. MD does not suspect stroke therefore TPA was not given. Care Plan: EEG, blood cultures, lab work per EDP. Hand off with Katrina, RN.   "IMPRESSION: No large vessel occlusion. Plaque at the left greater than right ICA origins without hemodynamically significant stenosis.  There is no acute intracranial hemorrhage or definite evidence of acute infarction. ASPECT score is 10. Age-indeterminate small vessel infarcts of the thalami and basal ganglia.  Chronic microvascular ischemic changes."   Keleigh Kazee, SRande Brunt RN  Stroke Response Nurse

## 2020-10-05 NOTE — Progress Notes (Signed)
EEG complete - results pending 

## 2020-10-06 ENCOUNTER — Observation Stay (HOSPITAL_BASED_OUTPATIENT_CLINIC_OR_DEPARTMENT_OTHER): Payer: Medicare Other

## 2020-10-06 DIAGNOSIS — R410 Disorientation, unspecified: Secondary | ICD-10-CM | POA: Diagnosis not present

## 2020-10-06 DIAGNOSIS — I6389 Other cerebral infarction: Secondary | ICD-10-CM | POA: Diagnosis not present

## 2020-10-06 DIAGNOSIS — G319 Degenerative disease of nervous system, unspecified: Secondary | ICD-10-CM | POA: Diagnosis not present

## 2020-10-06 DIAGNOSIS — M0579 Rheumatoid arthritis with rheumatoid factor of multiple sites without organ or systems involvement: Secondary | ICD-10-CM | POA: Diagnosis not present

## 2020-10-06 DIAGNOSIS — I10 Essential (primary) hypertension: Secondary | ICD-10-CM | POA: Diagnosis not present

## 2020-10-06 DIAGNOSIS — N400 Enlarged prostate without lower urinary tract symptoms: Secondary | ICD-10-CM | POA: Diagnosis not present

## 2020-10-06 LAB — COMPREHENSIVE METABOLIC PANEL
ALT: 16 U/L (ref 0–44)
AST: 21 U/L (ref 15–41)
Albumin: 3.5 g/dL (ref 3.5–5.0)
Alkaline Phosphatase: 55 U/L (ref 38–126)
Anion gap: 8 (ref 5–15)
BUN: 13 mg/dL (ref 8–23)
CO2: 22 mmol/L (ref 22–32)
Calcium: 9.5 mg/dL (ref 8.9–10.3)
Chloride: 107 mmol/L (ref 98–111)
Creatinine, Ser: 0.75 mg/dL (ref 0.61–1.24)
GFR, Estimated: >60 mL/min (ref >60)
Glucose, Bld: 83 mg/dL (ref 70–99)
Potassium: 3.4 mmol/L — ABNORMAL LOW (ref 3.5–5.1)
Sodium: 137 mmol/L (ref 135–145)
Total Bilirubin: 0.7 mg/dL (ref 0.3–1.2)
Total Protein: 5.7 g/dL — ABNORMAL LOW (ref 6.5–8.1)

## 2020-10-06 LAB — HEMOGLOBIN A1C
Hgb A1c MFr Bld: 5 % (ref 4.8–5.6)
Mean Plasma Glucose: 96.8 mg/dL

## 2020-10-06 LAB — CBC WITH DIFFERENTIAL/PLATELET
Abs Immature Granulocytes: 0.01 10*3/uL (ref 0.00–0.07)
Basophils Absolute: 0 10*3/uL (ref 0.0–0.1)
Basophils Relative: 0 %
Eosinophils Absolute: 0.1 10*3/uL (ref 0.0–0.5)
Eosinophils Relative: 1 %
HCT: 33.8 % — ABNORMAL LOW (ref 39.0–52.0)
Hemoglobin: 11.7 g/dL — ABNORMAL LOW (ref 13.0–17.0)
Immature Granulocytes: 0 %
Lymphocytes Relative: 26 %
Lymphs Abs: 1.4 10*3/uL (ref 0.7–4.0)
MCH: 33.1 pg (ref 26.0–34.0)
MCHC: 34.6 g/dL (ref 30.0–36.0)
MCV: 95.5 fL (ref 80.0–100.0)
Monocytes Absolute: 0.6 10*3/uL (ref 0.1–1.0)
Monocytes Relative: 11 %
Neutro Abs: 3.3 10*3/uL (ref 1.7–7.7)
Neutrophils Relative %: 62 %
Platelets: 178 10*3/uL (ref 150–400)
RBC: 3.54 MIL/uL — ABNORMAL LOW (ref 4.22–5.81)
RDW: 13.1 % (ref 11.5–15.5)
WBC: 5.4 10*3/uL (ref 4.0–10.5)
nRBC: 0 % (ref 0.0–0.2)

## 2020-10-06 LAB — ECHOCARDIOGRAM COMPLETE
AR max vel: 3.14 cm2
AV Area VTI: 3.39 cm2
AV Area mean vel: 3.21 cm2
AV Mean grad: 7 mmHg
AV Peak grad: 13.2 mmHg
Ao pk vel: 1.82 m/s
Area-P 1/2: 2.66 cm2
Calc EF: 58 %
MV VTI: 4.77 cm2
S' Lateral: 2.3 cm
Single Plane A2C EF: 38 %
Single Plane A4C EF: 69.1 %
Weight: 2582.03 oz

## 2020-10-06 LAB — LIPID PANEL
Cholesterol: 175 mg/dL (ref 0–200)
HDL: 59 mg/dL (ref >40)
LDL Cholesterol: 104 mg/dL — ABNORMAL HIGH (ref 0–99)
Total CHOL/HDL Ratio: 3 RATIO
Triglycerides: 58 mg/dL (ref <150)
VLDL: 12 mg/dL (ref 0–40)

## 2020-10-06 LAB — FOLATE: Folate: 25.6 ng/mL (ref >5.9)

## 2020-10-06 LAB — TSH: TSH: 3.614 u[IU]/mL (ref 0.350–4.500)

## 2020-10-06 LAB — VITAMIN B12: Vitamin B-12: 1400 pg/mL — ABNORMAL HIGH (ref 180–914)

## 2020-10-06 LAB — TROPONIN I (HIGH SENSITIVITY): Troponin I (High Sensitivity): 12 ng/L (ref <18)

## 2020-10-06 MED ORDER — POTASSIUM CHLORIDE CRYS ER 20 MEQ PO TBCR
40.0000 meq | EXTENDED_RELEASE_TABLET | Freq: Once | ORAL | Status: AC
  Administered 2020-10-06: 40 meq via ORAL
  Filled 2020-10-06: qty 2

## 2020-10-06 MED ORDER — GADOBUTROL 1 MMOL/ML IV SOLN
7.5000 mL | Freq: Once | INTRAVENOUS | Status: AC | PRN
  Administered 2020-10-06: 7.5 mL via INTRAVENOUS

## 2020-10-06 MED ORDER — DOXYCYCLINE HYCLATE 100 MG PO TABS: 100.0000 mg | ORAL_TABLET | Freq: Two times a day (BID) | ORAL | Status: DC

## 2020-10-06 NOTE — Discharge Instructions (Signed)
Confusion Confusion is the inability to think with your usual speed or clarity. Confusion can be caused by many things. People who are confused often describe their thinking as cloudy or unclear. Confusion can also include feeling disoriented. This means you are unaware of where you are or who you are. You may also not know the date or time. When confused, you may have trouble remembering, paying attention, or making decisions. Some people also act aggressively when they are confused. In some cases, confusion may come on quickly. In other cases, it may develop slowly over time. Confusion may be caused by medical conditions such as:  Infections, such as a urinary tract infection (UTI).  Low levels of oxygen, which can develop from conditions such as long-term lung disorders.  Decrease in brain function due to dementia and other conditions that affect the brain, such as seizures, strokes, brain tumors, or head injuries.  Mental health conditions, like panic attacks, anxiety, depression, and hallucinations. Confusion may also be caused by physical factors such as:  Loss of fluid (dehydration) or an imbalance of salts and minerals in the body (electrolytes).  Lack of certain nutrients like niacin, thiamine, or other B vitamins.  Fever or hypothermia, which is a sudden drop in body temperature.  Low or high blood sugar.  Low or high blood pressure. Other causes include:  Lack of sleep or changes in routine or surroundings, such as when traveling or staying in a hospital.  Using too much alcohol, drugs, or medicine.  Side effects of medicines, or taking medicines that affect other medicines (drug interactions). Follow these instructions at home: Pay attention to your symptoms. Tell your health care provider about any changes or if you develop new symptoms. Follow these instructions to control or treat symptoms. Ask a family member or friend for help if needed. Medicines  Take  over-the-counter and prescription medicines only as told by your health care provider.  Ask your health care provider about changing or stopping any medicines that may be causing your confusion.  Avoid pain medicines or sleep medicines until you have fully recovered.  Use a pillbox or an alarm to help you take the right medicines at the right time.   Lifestyle  Eat a balanced diet that includes fruits and vegetables.  Get enough sleep. For most adults, this is 7-9 hours each night.  Do not drink alcohol.  Do not become isolated. Spend time with other people and make plans for your days.  Do not drive until your health care provider says that it is safe to do so.  Do not use any products that contain nicotine or tobacco, such as cigarettes, e-cigarettes, and chewing tobacco. If you need help quitting, ask your health care provider.  Stop other activities that may increase your chances of getting hurt. These may include some work duties, sports activities, swimming, or bike riding. Ask your health care provider what activities are safe for you.   Tips for caregivers  Find out if the person is confused. Ask the person to state his or her name, age, and the date. If the person is unsure or answers incorrectly, he or she may be confused and need assistance.  Always introduce yourself, no matter how well the person knows you. Remind the person of his or her location.  Place a calendar and clock near the person who is confused. Keep a regular schedule. Make sure the person has plenty of light during the day and sleep at night.    Talk about current events and plans for the day.  Keep the environment calm, quiet, and peaceful.  Help the person do the things that he or she is unable to do. These include: ? Taking medicines. ? Keeping medical appointments. ? Helping with household duties, including meal preparation. ? Running errands.  Get help if you need it. There are several support  groups for caregivers. If the person you are helping needs more support, consider day care, extended-care programs, or a skilled nursing facility. The person's health care provider may be able to help evaluate these options. General instructions  Monitor yourself for any conditions you may have. These can include: ? Checking your blood glucose levels if you have diabetes. ? Maintaining a healthy weight. ? Monitoring your blood pressure if you have hypertension. ? Monitoring your body temperature if you have a fever.  Keep all follow-up visits. This is important. Contact a health care provider if:  You have new symptoms or your symptoms get worse. Get help right away if you:  Feel that you are not able to care for yourself.  Develop severe headaches, repeated vomiting, seizures, blackouts, or slurred speech.  Have increasing confusion, weakness, numbness, restlessness, or personality changes.  Develop a loss of balance, have marked dizziness, feel uncoordinated, or fall.  Develop severe anxiety, or you have delusions or hallucinations. These symptoms may represent a serious problem that is an emergency. Do not wait to see if the symptoms will go away. Get medical help right away. Call your local emergency services (911 in the [place] Do not drive yourself to the hospital. Summary  Confusion is the inability to think with your usual speed or clarity. People who are confused often describe their thinking as cloudy or unclear.  Confusion can also include having trouble remembering, paying attention, or making decisions.  Confusion may come on quickly or develop slowly over time, depending on the cause. There are many different causes of confusion.  Ask for help from family members or friends if you are unable to take care of yourself. This information is not intended to replace advice given to you by your health care provider. Make sure you discuss any questions you have with your health  care provider. Document Revised: 09/21/2019 Document Reviewed: 09/21/2019 Elsevier Patient Education  2021 Elsevier Inc.  

## 2020-10-06 NOTE — Evaluation (Signed)
Physical Therapy Evaluation Patient Details Name: Tanner Rice MRN: [id number] DOB: May 27, 1934 Today's Date: 10/06/2020   History of Present Illness  87 year old male with PHMx: HTN, RA, history of melanoma, benign prostatic hyperplasia and MRSA bacteremia/thoracic discitis 01/2016 who presents to [facility] emergency department with an episode of lethargy confusion and weakness as a code stroke. CT/MRI/EEG negative for CVA.  Clinical Impression  PTA, patient lives with wife and independent with mobility, driving, and working part time at a bookstore. Patient requires min guard for mobility with no AD. Mild balance deficits noted with x1 minor LOB. Patient presents with generalized weakness and impaired balance. Patient will benefit from skilled PT services during acute stay to address listed deficits. Recommend OPPT following discharge to address higher level balance deficits. Patient will likely decline as patient stated "I can do it on my own" when discussing recommendations.     Follow Up Recommendations Outpatient PT (patient will more than likely decline)    Equipment Recommendations  None recommended by PT    Recommendations for Other Services       Precautions / Restrictions Precautions Precautions: Fall Restrictions Weight Bearing Restrictions: No      Mobility  Bed Mobility Overal bed mobility: Modified Independent                  Transfers Overall transfer level: Needs assistance Equipment used: None Transfers: Sit to/from Stand Sit to Stand: Supervision         General transfer comment: supervision for safety  Ambulation/Gait Ambulation/Gait assistance: Min guard Gait Distance (Feet): 200 Feet Assistive device: None Gait Pattern/deviations: Step-through pattern;Drifts right/left     General Gait Details: drifting L/R throughout and x1 instance of minor LOB. Min guard for safety.  Stairs Stairs: Yes Stairs assistance: Min guard Stair  Management: One rail Right;Alternating pattern;Forwards Number of Stairs: 2 General stair comments: min guard for safety  Wheelchair Mobility    Modified Rankin (Stroke Patients Only)       Balance Overall balance assessment: Mild deficits observed, not formally tested                                           Pertinent Vitals/Pain Pain Assessment: No/denies pain    Home Living Family/patient expects to be discharged to:: Private residence Living Arrangements: Spouse/significant other Available Help at Discharge: Family;Available 24 hours/day Type of Home: House Home Access: Stairs to enter Entrance Stairs-Rails: None Entrance Stairs-Number of Steps: 5 Home Layout: Two level;Bed/bath upstairs;Able to live on main level with bedroom/bathroom;1/2 bath on main level Home Equipment: Cane - single point      Prior Function Level of Independence: Independent         Comments: drives, works part time at [employer] 1-2 days/week, independent with ADLs     Hand Dominance        Extremity/Trunk Assessment   Upper Extremity Assessment Upper Extremity Assessment: Defer to OT evaluation    Lower Extremity Assessment Lower Extremity Assessment: Generalized weakness (arthritis in B feet)       Communication   Communication: No difficulties  Cognition Arousal/Alertness: Awake/alert Behavior During Therapy: Flat affect Overall Cognitive Status: Impaired/Different from baseline Area of Impairment: Safety/judgement;Awareness;Problem solving                         Safety/Judgement: Decreased awareness of safety;Decreased awareness  of deficits Awareness: Emergent Problem Solving: Difficulty sequencing;Requires verbal cues General Comments: Disoriented to situation.      General Comments      Exercises     Assessment/Plan    PT Assessment Patient needs continued PT services  PT Problem List Decreased strength;Decreased activity  tolerance;Decreased balance;Decreased cognition;Decreased safety awareness       PT Treatment Interventions DME instruction;Gait training;Stair training;Functional mobility training;Therapeutic activities;Therapeutic exercise;Balance training;Patient/family education    PT Goals (Current goals can be found in the Care Plan section)  Acute Rehab PT Goals Patient Stated Goal: to go home PT Goal Formulation: With patient Time For Goal Achievement: 10/20/20 Potential to Achieve Goals: Good    Frequency Min 3X/week   Barriers to discharge        Co-evaluation               AM-PAC PT "6 Clicks" Mobility  Outcome Measure Help needed turning from your back to your side while in a flat bed without using bedrails?: None Help needed moving from lying on your back to sitting on the side of a flat bed without using bedrails?: None Help needed moving to and from a bed to a chair (including a wheelchair)?: A Little Help needed standing up from a chair using your arms (e.g., wheelchair or bedside chair)?: A Little Help needed to walk in hospital room?: A Little Help needed climbing 3-5 steps with a railing? : A Little 6 Click Score: 20    End of Session Equipment Utilized During Treatment: Gait belt Activity Tolerance: Patient tolerated treatment well Patient left: in bed;with call bell/phone within reach;with bed alarm set Nurse Communication: Mobility status PT Visit Diagnosis: Unsteadiness on feet (R26.81);Muscle weakness (generalized) (M62.81)    Time: 1287-8676 PT Time Calculation (min) (ACUTE ONLY): 21 min   Charges:   PT Evaluation $PT Eval Low Complexity: 1 Low          Tanner Rice A. Tanner Rice PT, DPT Acute Rehabilitation Services Pager [phone] Office [phone]   Tanner Rice 10/06/2020, 9:29 AM

## 2020-10-06 NOTE — TOC Initial Note (Signed)
Transition of Care [facility]) - Initial/Assessment Note    Patient Details  Name: Tanner Rice MRN: [id number] Date of Birth: [birth year]  [facility] CM/SW Contact:    Pollie Friar, RN Phone Number: 10/06/2020, 11:19 AM  Clinical Narrative:                 Patient with recommendations for outpatient therapy. CM met with the patient and he is refusing outpatient therapy at this time.  Pt denies issues with home medications or transportation. He states his wife can provide needed supervision at home.  TOC following.  Expected Discharge Plan: Home/Self Care Barriers to Discharge: Continued Medical Work up   Patient Goals and CMS Choice     Choice offered to / list presented to : Patient  Expected Discharge Plan and Services Expected Discharge Plan: Home/Self Care   Discharge Planning Services: CM Consult   Living arrangements for the past 2 months: Single Family Home                                      Prior Living Arrangements/Services Living arrangements for the past 2 months: Single Family Home Lives with:: Spouse Patient language and need for interpreter reviewed:: Yes Do you feel safe going back to the place where you live?: Yes        Care giver support system in place?: Yes (comment) Current home services: DME (cane) Criminal Activity/Legal Involvement Pertinent to Current Situation/Hospitalization: No - Comment as needed  Activities of Daily Living      Permission Sought/Granted                  Emotional Assessment Appearance:: Appears stated age Attitude/Demeanor/Rapport: Engaged Affect (typically observed): Accepting Orientation: : Oriented to Self,Oriented to Place,Oriented to  Time,Oriented to Situation   Psych Involvement: No (comment)  Admission diagnosis:  Altered mental status [R41.82] Pneumonia [J18.9] Altered mental status, unspecified altered mental status type [R41.82] Patient Active Problem List   Diagnosis  Date Noted  . Altered mental status 10/05/2020  . Constipation 05/07/2016  . Protein-calorie malnutrition ([place]) 05/07/2016  . Hyperglycemia 01/30/2016  . Normocytic anemia 01/30/2016  . Atherosclerotic peripheral vascular disease ([place]) 01/30/2016  . Degenerative joint disease of spine 01/30/2016  . BPH (benign prostatic hyperplasia) 01/30/2016  . MRSA bacteremia   . Thoracic discitis   . Rheumatoid arthritis involving multiple sites with positive rheumatoid factor ([place]) 01/29/2016  . Essential hypertension 01/29/2016  . Left adrenal mass ([place]) 01/29/2016   PCP:  Lajean Manes, MD Pharmacy:   [place], [place] - [address] [place] [address] Phone: [phone] Fax: [phone]     Social Determinants of Health (SDOH) Interventions    Readmission Risk Interventions No flowsheet data found.

## 2020-10-06 NOTE — TOC Transition Note (Signed)
Transition of Care [facility]) - CM/SW Discharge Note   Patient Details  Name: Tanner Rice MRN: [id number] Date of Birth: 02-28-1934  Transition of Care [facility]) CM/SW Contact:  Pollie Friar, RN Phone Number: 10/06/2020, 1:22 PM   Clinical Narrative:    Patient discharging home with self care. Pt refused outpatient therapy. No further needs per TOC.   Final next level of care: Home/Self Care Barriers to Discharge: No Barriers Identified   Patient Goals and CMS Choice     Choice offered to / list presented to : Patient  Discharge Placement                       Discharge Plan and Services   Discharge Planning Services: CM Consult                                 Social Determinants of Health (SDOH) Interventions     Readmission Risk Interventions No flowsheet data found.

## 2020-10-06 NOTE — Evaluation (Signed)
Occupational Therapy Evaluation and Discharge Patient Details Name: Tanner Rice MRN: [id number] DOB: [birth year] Today's Date: 10/06/2020    History of Present Illness 87 year old male with PHMx: HTN, RA, history of melanoma, benign prostatic hyperplasia and MRSA bacteremia/thoracic discitis 01/2016 who presents to [facility] emergency department with an episode of lethargy confusion and weakness as a code stroke. CT/MRI/EEG negative for CVA.   Clinical Impression   This 87 yo male admitted with above presents to acute OT at an independent to Mod I level for basic ADLs within this hospital environment and he has his wife at home 24/7. No further OT needs, we will sign off.    Follow Up Recommendations  No OT follow up    Equipment Recommendations  None recommended by OT       Precautions / Restrictions Precautions Precautions: Fall Restrictions Weight Bearing Restrictions: No      Mobility Bed Mobility Overal bed mobility: Independent                  Transfers Overall transfer level: Modified independent Equipment used: None         General transfer comment: increased time, he had already been up with PT eariler so this was his second time up this AM    Balance Overall balance assessment: Mild deficits observed, not formally tested                                         ADL either performed or assessed with clinical judgement   ADL Overall ADL's : Modified independent;Independent                                             Vision Baseline Vision/History: Wears glasses Wears Glasses: At all times Patient Visual Report: No change from baseline              Pertinent Vitals/Pain Pain Assessment: No/denies pain     Hand Dominance Right   Extremity/Trunk Assessment Upper Extremity Assessment Upper Extremity Assessment: Overall WFL for tasks assessed (RA in both hands)     Communication  Communication Communication: No difficulties   Cognition Arousal/Alertness: Awake/alert Behavior During Therapy: Flat affect Overall Cognitive Status: No family/caregiver present to determine baseline cognitive functioning General Comments: Pt alert and oriented to place, year, day of week, and month. When asked why he was here he stated "anxiety or something", pt reports he went to cell phone (his phone at quit working) store he was familiar with but they had closed down and he was told where there was another store that was open however that part of town he is unfamilar with so he got lost getting there and getting back home and he did not have a cell phone to call anyone, but he did stop and ask a policeman he spotted sitting in a parking lot.              Home Living Family/patient expects to be discharged to:: Private residence Living Arrangements: Spouse/significant other Available Help at Discharge: Family;Available 24 hours/day Type of Home: House Home Access: Stairs to enter [employer] of Steps: 5 Entrance Stairs-Rails: None Home Layout: Two level;Bed/bath upstairs;Able to live on main level with bedroom/bathroom;1/2 bath on main level Alternate Level Stairs-Number of  Steps: flight Alternate Level Stairs-Rails: Left Bathroom Shower/Tub: Tub/shower unit   Bathroom Toilet: Standard     Home Equipment: Cane - single point          Prior Functioning/Environment Level of Independence: Independent        Comments: drives, works part time at [employer] 1-2 days/week, independent with ADLs                 OT Goals(Current goals can be found in the care plan section) Acute Rehab OT Goals Patient Stated Goal: to go home                AM-PAC OT "6 Clicks" Daily Activity     Outcome Measure Help from another person eating meals?: None Help from another person taking care of personal grooming?: None Help from another person toileting, which includes  using toliet, bedpan, or urinal?: None Help from another person bathing (including washing, rinsing, drying)?: None Help from another person to put on and taking off regular upper body clothing?: None Help from another person to put on and taking off regular lower body clothing?: None 6 Click Score: 24   End of Session    Activity Tolerance: Patient tolerated treatment well Patient left: in bed;with call bell/phone within reach;with bed alarm set  OT Visit Diagnosis: Unsteadiness on feet (R26.81)                Time: 2376-2831 OT Time Calculation (min): 32 min Charges:  OT General Charges $OT Visit: 1 Visit OT Evaluation $OT Eval Moderate Complexity: 1 Mod OT Treatments $Self Care/Home Management : 8-22 mins  [address] [address], OTR/L Acute [employer] Pager [phone] Office [phone]     Almon Register 10/06/2020, 9:51 AM

## 2020-10-06 NOTE — Plan of Care (Signed)
Mod assist with adls 

## 2020-10-06 NOTE — Evaluation (Signed)
Speech Language Pathology Evaluation Patient Details Name: Tanner Rice MRN: [id number] DOB: [birth year] Today's Date: 10/06/2020 Time: 3220-2542 SLP Time Calculation (min) (ACUTE ONLY): 19 min  Problem List:  Patient Active Problem List   Diagnosis Date Noted  . Altered mental status 10/05/2020  . Constipation 05/07/2016  . Protein-calorie malnutrition ([place]) 05/07/2016  . Hyperglycemia 01/30/2016  . Normocytic anemia 01/30/2016  . Atherosclerotic peripheral vascular disease ([place]) 01/30/2016  . Degenerative joint disease of spine 01/30/2016  . BPH (benign prostatic hyperplasia) 01/30/2016  . MRSA bacteremia   . Thoracic discitis   . Rheumatoid arthritis involving multiple sites with positive rheumatoid factor ([place]) 01/29/2016  . Essential hypertension 01/29/2016  . Left adrenal mass ([place]) 01/29/2016   Past Medical History:  Past Medical History:  Diagnosis Date  . Hypertension   . Melanoma ([place])    upper thigh on right leg  . Rheumatoid arthritis(714.0)   . Thoracic discitis    Past Surgical History:  Past Surgical History:  Procedure Laterality Date  . CATARACT EXTRACTION, BILATERAL    . COLONOSCOPY    . hole in retina surgery    . INGUINAL HERNIA REPAIR Left 02/09/2019   Procedure: LEFT INGUINAL HERNIA REPAIR WITH MESH;  Surgeon: Rolm Bookbinder, MD;  Location: [place];  Service: General;  Laterality: Left;  GENERAL AND TAP BLOCK  . IR GENERIC HISTORICAL  04/24/2016   IR US GUIDE VASC ACCESS RIGHT 04/24/2016 Arne Cleveland, MD [facility]INTER[facility]  . IR GENERIC HISTORICAL  04/24/2016   IR FLUORO GUIDE CV LINE RIGHT 04/24/2016 Arne Cleveland, MD [facility]  . TEE WITHOUT CARDIOVERSION N/A 02/02/2016   Procedure: TRANSESOPHAGEAL ECHOCARDIOGRAM (TEE);  Surgeon: Jerline Pain, MD;  Location: [facility] ENDOSCOPY;  Service: Cardiovascular;  Laterality: N/A;  . 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 MRI 4/28 with no acute findings   Assessment / Plan / Recommendation Clinical Impression  Pt presents with functional cognitive linguistic ability.  Pt was assessed using COGNISTAT (see below for additional information).  Pt performed within average range on all subtests administered. Pt notes premorbid changes to memory, but pt recalled 1 item independently and 3 of 4 with category cue, this did not meet the score for even mild impairment.  Pt was extremely quick and accurate with calcuations.  Pt feels that he has returned to baseline.  There were no acute findings on his MRI and pt feels he has returned to baseline.  Pt has no ST needs at this time.  SLP will sign off.  COGNISTAT: All subtests are within the average range, except where otherwise specified.  Orientation:  11/12 Attention: 8/8 Comprehension: 5/6 Repetition: 12/12 Naming: 7/8 Construction: not assessed Memory: 9/12 Calculations: 4/4 Similarities: 7/8 Judgment: 5/6      SLP Assessment  SLP Recommendation/Assessment: Patient does not need any further Speech Lanaguage Pathology Services SLP Visit Diagnosis: Cognitive communication deficit (R41.841)    Follow Up Recommendations  None    Frequency and Duration   N/A        SLP Evaluation Cognition  Overall Cognitive Status: Within Functional Limits for tasks assessed Arousal/Alertness: Awake/alert Orientation Level: Oriented X4 Attention: Focused;Sustained Focused Attention: Appears intact Sustained Attention: Appears intact Memory: Appears intact Awareness: Appears  intact Problem Solving: Appears intact Executive Function: Reasoning;Sequencing Reasoning: Appears intact Sequencing: Appears intact        Comprehension  Auditory Comprehension Overall Auditory Comprehension: Appears within functional limits for tasks assessed Commands: Within Functional Limits Conversation: Simple Reading Comprehension Reading Status: Not tested    Expression Expression Primary Mode of Expression: Verbal Verbal Expression Overall Verbal Expression: Appears within functional limits for tasks assessed Repetition: No impairment Naming: No impairment Pragmatics: No impairment Written Expression Dominant Hand: Right Written Expression: Not tested   Oral / Motor  Motor Speech Overall Motor Speech: Appears within functional limits for tasks assessed Respiration: Within functional limits Phonation: Normal Resonance: Within functional limits Articulation: Within functional limitis Intelligibility: Intelligible Motor Planning: Witnin functional limits Motor Speech Errors: Not applicable   [place], [place], [place] Office: [phone] 10/06/2020, 10:29 AM

## 2020-10-06 NOTE — Discharge Summary (Addendum)
Physician Discharge Summary  Tanner Rice [id number] DOB: 1933-11-04 DOA: 10/05/2020  PCP: Lajean Manes, MD  Admit date: 10/05/2020 Discharge date: 10/06/2020  Time spent: 45 minutes  Recommendations for Outpatient Follow-up:  Patient will be discharged to home.  Patient will need to follow up with primary care provider within one week of discharge.  Patient should continue medications as prescribed.  Patient should follow a heart healthy diet.   Discharge Diagnoses:  Transient confusion Rheumatoid arthritis Essential hypertension History of MRSA bacteremia and thoracic discitis BPH  Discharge Condition: Stable  Diet recommendation: heart healthy  Filed Weights   10/05/20 1600 10/05/20 1717  Weight: 73.2 kg 73.2 kg    History of present illness:  On 10/05/2020 by Dr. Inda Merlin 87 year old male with past medical history of hypertension, rheumatoid arthritis, history of melanoma, benign prostatic hyperplasia and MRSA bacteremia/thoracic discitis 01/2016 on chronic suppressive doxycycline therapy who presents to [facility] emergency department with an episode of lethargy confusion and weakness as a code stroke.  Patient is a somewhat poor historian due to transient confusion and lethargy during the episode.  I have attempted to contact the wife via listed phone numbers but she has not answered any of them.  Majority the history is been obtained from both the patient, discussions with the emergency department provider as well as review of neurology notes.  Patient's last known normal was approximately 1:30 PM.  At that time, the patient was planning on going to the store to purchase a new cell phone.  Patient explains that as he was driving he "got lost."  He reports that he was never able to make it to the store and after 2 and half hours he eventually made it back home.  Upon attempting to get back into his home he noticed that he was extremely weak and shaky and  due to the shaking had significant difficulty using his keys to his house.  Upon entering his home patient was visibly weak, having difficulty with ambulating and "not acting right" according to the wife.  Patient would only answer "yes and no" questions.  After having the patient sit down wife provided the patient with 81 mg of aspirin and call 911.    Upon EMS arrival, the appreciated was seen to be aphasia and generalized weakness and activated a code stroke.  Was promptly brought into [facility] emergency room for evaluation.  It is worth noting that in route, patient's lethargy and decreased responsiveness seemed to transiently worsen.  Upon evaluation in the emergency department patient was promptly evaluated by Dr. Quinn Axe with neurology.  Upon Dr. Artemio Aly evaluation, patient's symptoms seemd to be rapidly improving and tPA was not given.  Stat noncontrast CT of the head was performed revealing no evidence of hemorrhage or obvious acute infarct.  CT angiogram of the head and neck revealed no obvious large vessel occlusion.  Based on this initial evaluation, neurology recommended encephalopathy work-up, stat EEG and MRI brain with and without contrast.  The hospitalist group was then called to assess the patient for admission to the hospital.  Hospital Course:  Transient confusion -Patient presented with altered mental status, weakness, tremulousness and transient aphasia with confusion.  The symptoms had resolved prior to admission -Neurology consulted and appreciated -CT head showed no acute intracranial hemorrhage.  Age-indeterminate small vessel infarcts of the thalami and basal ganglia.  Chronic microvascular ischemic changes -MRI showed no acute intercranial normality. -Infectious etiology ruled out given that blood cultures  are currently negative, UA and chest x-ray unremarkable for infection.  Patient currently not febrile and has no leukocytosis. -Echocardiogram shows an EF  of 65 to 69%, grade 1 diastolic dysfunction.  No regional wall motion abnormalities.  Moderate LVH. -EEG showed no seizures or left form discharges -PT recommended outpatient physical therapy- patient refused -Patient with no follow-up needs for speech or occupational therapy -Discussed with neurology, Dr. Quinn Axe, no further work-up or treatment at this point given that patient is back to baseline.  Rheumatoid arthritis -Patient is using methotrexate and Simponi -continue to follow with rheumatology  Essential hypertension -Continue home medications  History of MRSA bacteremia and thoracic discitis -Patient currently afebrile with no leukocytosis -Continue suppressive doxycycline  BPH -Continue alfuzosin  Procedures: EEG Echocardiogram  Consultations: Neurology  Discharge Exam: Vitals:   10/06/20 0700 10/06/20 1100  BP: 139/79 (!) 154/72  Pulse: (!) 56 63  Resp: 14 15  Temp: 97.6 F (36.4 C) 98.6 F (37 C)  SpO2: 97% 96%     General: Well developed, chronically ill-appearing, elderly, NAD  HEENT: NCAT, PERRLA, EOMI, Anicteic Sclera, mucous membranes moist.  Cardiovascular: S1 S2 auscultated, RRR  Respiratory: Clear to auscultation bilaterally with equal chest rise  Abdomen: Soft, nontender, nondistended, + bowel sounds  Extremities: warm dry without cyanosis clubbing or edema  Neuro: AAOx3, nonfocal  Skin: Without rashes exudates or nodules  Psych: appropriate mood and affect, pleasant  Discharge Instructions Discharge Instructions    Diet - low sodium heart healthy   Complete by: As directed    Discharge instructions   Complete by: As directed    Patient will be discharged to home.  Patient will need to follow up with primary care provider within one week of discharge.  Patient should continue medications as prescribed.  Patient should follow a heart healthy diet.   Increase activity slowly   Complete by: As directed      Allergies as of 10/06/2020       Reactions   Other Diarrhea, Nausea And Vomiting, Other (See Comments)   Seafood - mussels    Penicillins Cross Reactors Hives   Did it involve swelling of the face/tongue/throat, SOB, or low BP? Unknown Did it involve sudden or severe rash/hives, skin peeling, or any reaction on the inside of your mouth or nose? Unknown Did you need to seek medical attention at a hospital or doctor's office? Yes When did it last happen?over 65 years ago If all above answers are "NO", may proceed with cephalosporin use. .      Medication List    STOP taking these medications   traMADol 50 MG tablet Commonly known as: ULTRAM     TAKE these medications   alfuzosin 10 MG 24 hr tablet Commonly known as: UROXATRAL Take 10 mg by mouth every evening.   amLODipine 10 MG tablet Commonly known as: NORVASC Take 1 tablet (10 mg total) by mouth daily.   calcium citrate-vitamin D 315-200 MG-UNIT tablet Commonly known as: CITRACAL+D Take 1 tablet by mouth daily.   cholecalciferol 25 MCG (1000 UNIT) tablet Commonly known as: VITAMIN D Take 1,000 Units by mouth daily.   docusate sodium 250 MG capsule Commonly known as: COLACE Take 250 mg by mouth 2 (two) times daily.   doxycycline 100 MG tablet Commonly known as: VIBRA-TABS Take 1 tablet (100 mg total) by mouth 2 (two) times daily. Give 2 hours before or 4 hours after iron. What changed: additional instructions   Ferrous Sulfate 90 (18  Fe) MG Tabs Take 1 tablet by mouth daily.   folic acid 1 MG tablet Commonly known as: FOLVITE Take 1 mg by mouth daily.   golimumab 50 MG/4ML Soln injection Commonly known as: SIMPONI ARIA Inject 50 mg into the vein See admin instructions. Every 6-8 weeks   lisinopril 20 MG tablet Commonly known as: ZESTRIL Take 20 mg by mouth daily.   melatonin 5 MG Tabs Take 5 mg by mouth at bedtime as needed (sleep).   methotrexate 2.5 MG tablet Commonly known as: RHEUMATREX Take 10 mg by mouth every  Thursday.   metoprolol succinate 50 MG 24 hr tablet Commonly known as: TOPROL-XL Take 1 tablet (50 mg total) by mouth daily. Take with or immediately following a meal. What changed: when to take this   multivitamin with minerals Tabs tablet Take 1 tablet by mouth daily.   naproxen sodium 220 MG tablet Commonly known as: ALEVE Take 220 mg by mouth 2 (two) times daily as needed (pain.).   sulfaSALAzine 500 MG tablet Commonly known as: AZULFIDINE Take 1,000 mg by mouth 2 (two) times daily.   vitamin B-12 100 MCG tablet Commonly known as: CYANOCOBALAMIN Take 100 mcg by mouth daily.      Allergies  Allergen Reactions  . Other Diarrhea, Nausea And Vomiting and Other (See Comments)    Seafood - mussels   . Penicillins Cross Reactors Hives    Did it involve swelling of the face/tongue/throat, SOB, or low BP? Unknown Did it involve sudden or severe rash/hives, skin peeling, or any reaction on the inside of your mouth or nose? Unknown Did you need to seek medical attention at a hospital or doctor's office? Yes When did it last happen?over 65 years ago If all above answers are "NO", may proceed with cephalosporin use. .    Follow-up Information    Stoneking, Hal, MD. Schedule an appointment as soon as possible for a visit in 1 week(s).   Specialty: Internal Medicine Why: Hospital follow up Contact information: [address] [place] [address] [phone]                The results of significant diagnostics from this hospitalization (including imaging, microbiology, ancillary and laboratory) are listed below for reference.    Significant Diagnostic Studies: DG Chest 1 View  Result Date: 10/05/2020 CLINICAL DATA:  Pneumonia. Immune modulation for RA with history of recurrent infection. EXAM: CHEST  1 VIEW COMPARISON:  Radiograph 10/24/2016 FINDINGS: The cardiomediastinal contours are stable. Aortic atherosclerosis and tortuosity. The lungs are  clear. Pulmonary vasculature is normal. No consolidation, pleural effusion, or pneumothorax. No acute osseous abnormalities are seen. IMPRESSION: No acute chest finding. Electronically Signed   By: Keith Rake M.D.   On: 10/05/2020 20:52   MR BRAIN W WO CONTRAST  Result Date: 10/06/2020 CLINICAL DATA:  Initial evaluation for acute neuro deficit, stroke suspected, delirium. EXAM: MRI HEAD WITHOUT AND WITH CONTRAST TECHNIQUE: Multiplanar, multiecho pulse sequences of the brain and surrounding structures were obtained without and with intravenous contrast. CONTRAST:  7.41mL GADAVIST GADOBUTROL 1 MMOL/ML IV SOLN COMPARISON:  Prior CT from 10/05/2020. FINDINGS: Brain: Generalized age-related cerebral atrophy. Patchy T2/FLAIR hyperintensity within the periventricular and deep white matter both cerebral hemispheres most consistent with chronic small vessel ischemic disease, mild in nature. No abnormal foci of restricted diffusion to suggest acute or subacute ischemia. Gray-white matter differentiation maintained. No encephalomalacia to suggest chronic cortical infarction. No acute intracranial hemorrhage. Multiple scattered chronic micro hemorrhages  noted, most pronounced about the cerebellum, brainstem, and deep gray nuclei, most likely related to chronic poorly controlled hypertension. No mass lesion, midline shift or mass effect. No hydrocephalus or extra-axial fluid collection. Pituitary gland suprasellar region within normal limits. Midline structures intact. No abnormal enhancement. Vascular: Major intracranial vascular flow voids are maintained. Skull and upper cervical spine: Chronic degenerative changes noted about the C1-2 articulation without high-grade stenosis. Craniocervical junction otherwise unremarkable. Bone marrow signal intensity within normal limits. No focal marrow replacing lesion. No visible scalp soft tissue abnormality. Sinuses/Orbits: Globes orbital soft tissues demonstrate no acute  finding. Patient status post bilateral ocular lens replacement. Paranasal sinuses are largely clear. Small left mastoid effusion noted, of doubtful significance. Inner ear structures grossly normal. Other: None. IMPRESSION: 1. No acute intracranial abnormality. 2. Generalized age-related cerebral atrophy with mild chronic small vessel ischemic disease. 3. Multiple scattered chronic micro hemorrhages involving the brain, most likely related to chronic poorly controlled hypertension. Electronically Signed   By: Jeannine Boga M.D.   On: 10/06/2020 01:56   EEG adult  Result Date: 10/05/2020 Lora Havens, MD     10/05/2020  7:36 PM Patient Name: Tanner Rice MRN: [id number] Epilepsy Attending: Lora Havens Referring Physician/Provider: Dr Su Monks Date: 10/05/2020 Duration: 21.36 mins Patient history: 87 yo man on immunomodulation for RA and hx recurrent infection incl MRSA bacteremia 2017 c/b thoracic discitis p/w acute onset encephalopathy with nonfocal exam. EEG to evaluate for seizure  Level of alertness: Awake AEDs during EEG study: None Technical aspects: This EEG study was done with scalp electrodes positioned according to the 10-20 International system of electrode placement. Electrical activity was acquired at a sampling rate of 500Hz  and reviewed with a high frequency filter of 70Hz  and a low frequency filter of 1Hz . EEG data were recorded continuously and digitally stored. Description: The posterior dominant rhythm consists of 8-9 Hz activity of moderate voltage (25-35 uV) seen predominantly in posterior head regions, symmetric and reactive to eye opening and eye closing.  Physiologic photic driving was seen during photic stimulation. Hyperventilation was not performed.   IMPRESSION: This study is within normal limits. No seizures or epileptiform discharges were seen throughout the recording. Lora Havens   ECHOCARDIOGRAM COMPLETE  Result Date: 10/06/2020    ECHOCARDIOGRAM  REPORT   Patient Name:   Tanner Rice [facility] Date of Exam: 10/06/2020 Medical Rec #:  [id number]      Height:       66.0 in Accession #:    [id number]     Weight:       161.4 lb Date of Birth:  [birth year]      BSA:          1.826 m Patient Age:    77 years       BP:           147/72 mmHg Patient Gender: M              HR:           54 bpm. Exam Location:  Inpatient Procedure: 2D Echo, Cardiac Doppler and Color Doppler Indications:    CVA  History:        Patient has prior history of Echocardiogram examinations, most                 recent 02/02/2016. Risk Factors:Hypertension.  Sonographer:    [place] Referring Phys: [id number] [place]  1. Moderate septal hypertrophy with otherwise mild,  concentric LVH. Left ventricular ejection fraction, by estimation, is 65 to 70%. The left ventricle has normal function. The left ventricle has no regional wall motion abnormalities. There is moderate left ventricular hypertrophy. Left ventricular diastolic parameters are consistent with Grade I diastolic dysfunction (impaired relaxation).  2. Right ventricular systolic function is normal. The right ventricular size is normal. There is normal pulmonary artery systolic pressure.  3. Left atrial size was mildly dilated.  4. The mitral valve is normal in structure. Trivial mitral valve regurgitation. No evidence of mitral stenosis.  5. The aortic valve is tricuspid. There is moderate calcification of the aortic valve. There is moderate thickening of the aortic valve. Aortic valve regurgitation is not visualized. No aortic stenosis is present.  6. The inferior vena cava is normal in size with greater than 50% respiratory variability, suggesting right atrial pressure of 3 mmHg. FINDINGS  Left Ventricle: Moderate septal hypertrophy with otherwise mild, concentric LVH. Left ventricular ejection fraction, by estimation, is 65 to 70%. The left ventricle has normal function. The left ventricle has no regional wall motion  abnormalities. The left ventricular internal cavity size was normal in size. There is moderate left ventricular hypertrophy. Left ventricular diastolic parameters are consistent with Grade I diastolic dysfunction (impaired relaxation). Indeterminate filling pressures. Right Ventricle: The right ventricular size is normal. No increase in right ventricular wall thickness. Right ventricular systolic function is normal. There is normal pulmonary artery systolic pressure. The tricuspid regurgitant velocity is 2.76 m/s, and  with an assumed right atrial pressure of 3 mmHg, the estimated right ventricular systolic pressure is [id number] mmHg. Left Atrium: Left atrial size was mildly dilated. Right Atrium: Right atrial size was normal in size. Pericardium: There is no evidence of pericardial effusion. Mitral Valve: The mitral valve is normal in structure. Trivial mitral valve regurgitation. No evidence of mitral valve stenosis. MV peak gradient, 4.2 mmHg. The mean mitral valve gradient is 1.0 mmHg. Tricuspid Valve: The tricuspid valve is normal in structure. Tricuspid valve regurgitation is mild . No evidence of tricuspid stenosis. Aortic Valve: The aortic valve is tricuspid. There is moderate calcification of the aortic valve. There is moderate thickening of the aortic valve. Aortic valve regurgitation is not visualized. No aortic stenosis is present. Aortic valve mean gradient measures 7.0 mmHg. Aortic valve peak gradient measures 13.2 mmHg. Aortic valve area, by VTI measures 3.39 cm. Pulmonic Valve: The pulmonic valve was normal in structure. Pulmonic valve regurgitation is trivial. No evidence of pulmonic stenosis. Aorta: The aortic root is normal in size and structure. Venous: The inferior vena cava is normal in size with greater than 50% respiratory variability, suggesting right atrial pressure of 3 mmHg. IAS/Shunts: No atrial level shunt detected by color flow Doppler.  LEFT VENTRICLE PLAX 2D LVIDd:         4.60 cm      Diastology LVIDs:         2.30 cm     LV e' medial:    4.90 cm/s LV PW:         1.10 cm     LV E/e' medial:  13.0 LV IVS:        1.40 cm     LV e' lateral:   6.09 cm/s LVOT diam:     2.70 cm     LV E/e' lateral: 10.5 LV SV:         141 LV SV Index:   77 LVOT Area:     5.73 cm  LV  Volumes (MOD) LV vol d, MOD A2C: 46.1 ml LV vol d, MOD A4C: 78.7 ml LV vol s, MOD A2C: 28.6 ml LV vol s, MOD A4C: 24.3 ml LV SV MOD A2C:     17.5 ml LV SV MOD A4C:     78.7 ml LV SV MOD BP:      36.3 ml RIGHT VENTRICLE RV S prime:     14.50 cm/s TAPSE (M-mode): 2.2 cm LEFT ATRIUM             Index       RIGHT ATRIUM           Index LA diam:        2.80 cm 1.53 cm/m  RA Area:     16.20 cm LA Vol (A2C):   57.8 ml 31.66 ml/m RA Volume:   44.70 ml  24.49 ml/m LA Vol (A4C):   60.8 ml 33.31 ml/m LA Biplane Vol: 61.2 ml 33.53 ml/m  AORTIC VALVE                    PULMONIC VALVE AV Area (Vmax):    3.14 cm     PV Vmax:       1.00 m/s AV Area (Vmean):   3.21 cm     PV Vmean:      73.500 cm/s AV Area (VTI):     3.39 cm     PV VTI:        0.254 m AV Vmax:           182.00 cm/s  PV Peak grad:  4.0 mmHg AV Vmean:          128.000 cm/s PV Mean grad:  2.0 mmHg AV VTI:            0.417 m AV Peak Grad:      13.2 mmHg AV Mean Grad:      7.0 mmHg LVOT Vmax:         99.90 cm/s LVOT Vmean:        71.700 cm/s LVOT VTI:          0.247 m LVOT/AV VTI ratio: 0.59  AORTA Ao Root diam: 3.60 cm Ao Asc diam:  3.10 cm MITRAL VALVE               TRICUSPID VALVE MV Area (PHT): 2.66 cm    TR Peak grad:   30.5 mmHg MV Area VTI:   4.77 cm    TR Vmax:        276.00 cm/s MV Peak grad:  4.2 mmHg MV Mean grad:  1.0 mmHg    SHUNTS MV Vmax:       1.03 m/s    Systemic VTI:  0.25 m MV Vmean:      52.4 cm/s   Systemic Diam: 2.70 cm MV Decel Time: 285 msec MV E velocity: 63.80 cm/s MV A velocity: 90.00 cm/s MV E/A ratio:  0.71 Skeet Latch MD Electronically signed by Skeet Latch MD Signature Date/Time: 10/06/2020/11:14:54 AM    Final    CT HEAD CODE STROKE WO  CONTRAST  Result Date: 10/05/2020 CLINICAL DATA:  Code stroke.  Aphasia, weakness EXAM: CT HEAD WITHOUT CONTRAST TECHNIQUE: Contiguous axial images were obtained from the base of the skull through the vertex without intravenous contrast. COMPARISON:  None. FINDINGS: Brain: No acute intracranial hemorrhage, mass effect, or edema. Gray-white differentiation is preserved. Prominence of the ventricles and sulci reflects generalized parenchymal volume loss. Patchy hypoattenuation in  the supratentorial white matter is nonspecific but probably reflects mild to moderate chronic microvascular ischemic changes. Age indeterminate small vessel infarcts of the thalami and basal ganglia. There is no extra-axial collection. Vascular: No hyperdense vessel. There is intracranial atherosclerotic calcification at the skull base. Skull: Unremarkable. Sinuses/Orbits: No acute abnormality. Other: Mastoid air cells are clear. ASPECTS ([place] Stroke Program Early CT Score) - Ganglionic level infarction (caudate, lentiform nuclei, internal capsule, insula, M1-M3 cortex): 7 - Supraganglionic infarction (M4-M6 cortex): 3 Total score (0-10 with 10 being normal): 10 IMPRESSION: There is no acute intracranial hemorrhage or definite evidence of acute infarction. ASPECT score is 10. Age-indeterminate small vessel infarcts of the thalami and basal ganglia. Chronic microvascular ischemic changes. These results were communicated to Dr. Quinn Axe At 5:08 pm on 10/05/2020 by text page via the [facility] messaging system. Electronically Signed   By: Macy Mis M.D.   On: 10/05/2020 17:10   CT ANGIO HEAD CODE STROKE  Result Date: 10/05/2020 CLINICAL DATA:  Aphasia, weakness EXAM: CT ANGIOGRAPHY HEAD AND NECK TECHNIQUE: Multidetector CT imaging of the head and neck was performed using the standard protocol during bolus administration of intravenous contrast. Multiplanar CT image reconstructions and MIPs were obtained to evaluate the vascular anatomy.  Carotid stenosis measurements (when applicable) are obtained utilizing NASCET criteria, using the distal internal carotid diameter as the denominator. CONTRAST:  80mL OMNIPAQUE IOHEXOL 350 MG/ML SOLN COMPARISON:  None. FINDINGS: CTA NECK Aortic arch: Great vessel origins are patent. Right carotid system: Patent. Mixed plaque along the proximal internal carotid causing minimal stenosis. Left carotid system: Patent. Primarily calcified plaque along the proximal internal carotid causing less than 50% stenosis. Vertebral arteries: Patent and codominant.  No stenosis. Skeleton: Advanced degenerative changes of the included spine. Osseous demineralization. Other neck: Unremarkable. Upper chest: No apical lung mass. Review of the MIP images confirms the above findings CTA HEAD Anterior circulation: Intracranial internal carotid arteries are patent with calcified plaque along the distal cavernous and supraclinoid portions with up to mild stenosis. Anterior and middle cerebral arteries are patent. Posterior circulation: Intracranial vertebral arteries are patent with minimal calcified plaque. Basilar artery is patent. Major cerebellar artery origins are patent. Posterior cerebral arteries are patent. Venous sinuses: Patent as allowed by contrast bolus timing. Review of the MIP images confirms the above findings IMPRESSION: No large vessel occlusion. Plaque at the left greater than right ICA origins without hemodynamically significant stenosis. These results were communicated to Dr. Quinn Axe at 5:08 pmon 10/05/2020 by text page via the [facility] messaging system. Electronically Signed   By: Macy Mis M.D.   On: 10/05/2020 17:18   CT ANGIO NECK CODE STROKE  Result Date: 10/05/2020 CLINICAL DATA:  Aphasia, weakness EXAM: CT ANGIOGRAPHY HEAD AND NECK TECHNIQUE: Multidetector CT imaging of the head and neck was performed using the standard protocol during bolus administration of intravenous contrast. Multiplanar CT image  reconstructions and MIPs were obtained to evaluate the vascular anatomy. Carotid stenosis measurements (when applicable) are obtained utilizing NASCET criteria, using the distal internal carotid diameter as the denominator. CONTRAST:  75mL OMNIPAQUE IOHEXOL 350 MG/ML SOLN COMPARISON:  None. FINDINGS: CTA NECK Aortic arch: Great vessel origins are patent. Right carotid system: Patent. Mixed plaque along the proximal internal carotid causing minimal stenosis. Left carotid system: Patent. Primarily calcified plaque along the proximal internal carotid causing less than 50% stenosis. Vertebral arteries: Patent and codominant.  No stenosis. Skeleton: Advanced degenerative changes of the included spine. Osseous demineralization. Other neck: Unremarkable. Upper chest: No apical  lung mass. Review of the MIP images confirms the above findings CTA HEAD Anterior circulation: Intracranial internal carotid arteries are patent with calcified plaque along the distal cavernous and supraclinoid portions with up to mild stenosis. Anterior and middle cerebral arteries are patent. Posterior circulation: Intracranial vertebral arteries are patent with minimal calcified plaque. Basilar artery is patent. Major cerebellar artery origins are patent. Posterior cerebral arteries are patent. Venous sinuses: Patent as allowed by contrast bolus timing. Review of the MIP images confirms the above findings IMPRESSION: No large vessel occlusion. Plaque at the left greater than right ICA origins without hemodynamically significant stenosis. These results were communicated to Dr. Quinn Axe at 5:08 pmon 10/05/2020 by text page via the [facility] messaging system. Electronically Signed   By: Macy Mis M.D.   On: 10/05/2020 17:18    Microbiology: Recent Results (from the past 240 hour(s))  Blood culture (routine x 2)     Status: None (Preliminary result)   Collection Time: 10/05/20  5:33 PM   Specimen: BLOOD  Result Value Ref Range Status   Specimen  Description BLOOD LEFT ANTECUBITAL  Final   Special Requests   Final    BOTTLES DRAWN AEROBIC AND ANAEROBIC Blood Culture results may not be optimal due to an inadequate volume of blood received in culture bottles   Culture   Final    NO GROWTH < 12 HOURS Performed at [facility] [address], [place], [place] [address]    Report Status PENDING  Incomplete  Blood culture (routine x 2)     Status: None (Preliminary result)   Collection Time: 10/05/20  5:35 PM   Specimen: BLOOD  Result Value Ref Range Status   Specimen Description BLOOD RIGHT ANTECUBITAL  Final   Special Requests   Final    BOTTLES DRAWN AEROBIC AND ANAEROBIC Blood Culture adequate volume   Culture   Final    NO GROWTH < 12 HOURS Performed at [facility], [place] [address] [place], [place] [address]    Report Status PENDING  Incomplete  Resp Panel by RT-PCR (Flu A&B, Covid) Nasopharyngeal Swab     Status: None   Collection Time: 10/05/20  5:39 PM   Specimen: Nasopharyngeal Swab; Nasopharyngeal(NP) swabs in vial transport medium  Result Value Ref Range Status   SARS Coronavirus 2 by RT PCR NEGATIVE NEGATIVE Final    Comment: (NOTE) SARS-CoV-2 target nucleic acids are NOT DETECTED.  The SARS-CoV-2 RNA is generally detectable in upper respiratory specimens during the acute phase of infection. The lowest concentration of SARS-CoV-2 viral copies this assay can detect is 138 copies/mL. A negative result does not preclude SARS-Cov-2 infection and should not be used as the sole basis for treatment or other patient management decisions. A negative result may occur with  improper specimen collection/handling, submission of specimen other than nasopharyngeal swab, presence of viral mutation(s) within the areas targeted by this assay, and inadequate number of viral copies(<138 copies/mL). A negative result must be combined with clinical observations, patient history, and epidemiological information. The  expected result is Negative.  Fact Sheet for Patients:  EntrepreneurPulse.com.au  Fact Sheet for Healthcare Providers:  IncredibleEmployment.be  This test is no t yet approved or cleared by the [place] FDA and  has been authorized for detection and/or diagnosis of SARS-CoV-2 by FDA under an Emergency Use Authorization (EUA). This EUA will remain  in effect (meaning this test can be used) for the duration of the COVID-19 declaration under Section 564(b)(1)  of the Act, 21 U.S.C.section 360bbb-3(b)(1), unless the authorization is terminated  or revoked sooner.       Influenza A by PCR NEGATIVE NEGATIVE Final   Influenza B by PCR NEGATIVE NEGATIVE Final    Comment: (NOTE) The Xpert Xpress SARS-CoV-2/FLU/RSV plus assay is intended as an aid in the diagnosis of influenza from Nasopharyngeal swab specimens and should not be used as a sole basis for treatment. Nasal washings and aspirates are unacceptable for Xpert Xpress SARS-CoV-2/FLU/RSV testing.  Fact Sheet for Patients: EntrepreneurPulse.com.au  Fact Sheet for Healthcare Providers: IncredibleEmployment.be  This test is not yet approved or cleared by the [place] FDA and has been authorized for detection and/or diagnosis of SARS-CoV-2 by FDA under an Emergency Use Authorization (EUA). This EUA will remain in effect (meaning this test can be used) for the duration of the COVID-19 declaration under Section 564(b)(1) of the Act, 21 U.S.C. section 360bbb-3(b)(1), unless the authorization is terminated or revoked.  Performed at [facility], [place] [address] [place], [place] [address]      Labs: Basic Metabolic Panel: Recent Labs  Lab 10/05/20 1647 10/05/20 1655 10/06/20 0058  NA 135 138 137  K 3.7 3.8 3.4*  CL 104 105 107  CO2 23  --  22  GLUCOSE 100* 99 83  BUN 15 18 13   CREATININE 0.77 0.70 0.75  CALCIUM 9.9  --  9.5   Liver  Function Tests: Recent Labs  Lab 10/05/20 1647 10/06/20 0058  AST 26 21  ALT 17 16  ALKPHOS 64 55  BILITOT 0.5 0.7  PROT 6.6 5.7*  ALBUMIN 4.1 3.5   No results for input(s): LIPASE, AMYLASE in the last 168 hours. Recent Labs  Lab 10/05/20 1732  AMMONIA 25   CBC: Recent Labs  Lab 10/05/20 1647 10/05/20 1655 10/06/20 0058  WBC 6.9  --  5.4  NEUTROABS 4.9  --  3.3  HGB 12.8* 12.2* 11.7*  HCT 37.7* 36.0* 33.8*  MCV 99.2  --  95.5  PLT 186  --  178   Cardiac Enzymes: No results for input(s): CKTOTAL, CKMB, CKMBINDEX, TROPONINI in the last 168 hours. BNP: BNP (last 3 results) No results for input(s): BNP in the last 8760 hours.  ProBNP (last 3 results) No results for input(s): PROBNP in the last 8760 hours.  CBG: No results for input(s): GLUCAP in the last 168 hours.     Signed:  Cristal Ford  [facility] 10/06/2020, 12:36 PM

## 2020-10-06 NOTE — Progress Notes (Signed)
*  PRELIMINARY RESULTS* Echocardiogram 2D Echocardiogram has been performed.  Luisa Hart 10/06/2020, 8:08 AM

## 2020-10-07 LAB — URINE CULTURE: Culture: NO GROWTH

## 2020-10-10 LAB — CULTURE, BLOOD (ROUTINE X 2)
Culture: NO GROWTH
Culture: NO GROWTH
Special Requests: ADEQUATE

## 2020-10-11 DIAGNOSIS — Z79899 Other long term (current) drug therapy: Secondary | ICD-10-CM | POA: Diagnosis not present

## 2020-10-11 DIAGNOSIS — M0579 Rheumatoid arthritis with rheumatoid factor of multiple sites without organ or systems involvement: Secondary | ICD-10-CM | POA: Diagnosis not present

## 2020-10-13 DIAGNOSIS — C61 Malignant neoplasm of prostate: Secondary | ICD-10-CM | POA: Diagnosis not present

## 2020-10-16 DIAGNOSIS — Z85828 Personal history of other malignant neoplasm of skin: Secondary | ICD-10-CM | POA: Diagnosis not present

## 2020-10-16 DIAGNOSIS — B372 Candidiasis of skin and nail: Secondary | ICD-10-CM | POA: Diagnosis not present

## 2020-10-16 DIAGNOSIS — L720 Epidermal cyst: Secondary | ICD-10-CM | POA: Diagnosis not present

## 2020-10-16 DIAGNOSIS — Z85820 Personal history of malignant melanoma of skin: Secondary | ICD-10-CM | POA: Diagnosis not present

## 2020-10-16 DIAGNOSIS — L603 Nail dystrophy: Secondary | ICD-10-CM | POA: Diagnosis not present

## 2020-11-07 DIAGNOSIS — J189 Pneumonia, unspecified organism: Secondary | ICD-10-CM | POA: Diagnosis not present

## 2020-11-07 DIAGNOSIS — Z20822 Contact with and (suspected) exposure to covid-19: Secondary | ICD-10-CM | POA: Diagnosis not present

## 2020-11-07 DIAGNOSIS — R059 Cough, unspecified: Secondary | ICD-10-CM | POA: Diagnosis not present

## 2020-11-21 DIAGNOSIS — M0579 Rheumatoid arthritis with rheumatoid factor of multiple sites without organ or systems involvement: Secondary | ICD-10-CM | POA: Diagnosis not present

## 2021-01-16 DIAGNOSIS — Z79899 Other long term (current) drug therapy: Secondary | ICD-10-CM | POA: Diagnosis not present

## 2021-01-16 DIAGNOSIS — M0579 Rheumatoid arthritis with rheumatoid factor of multiple sites without organ or systems involvement: Secondary | ICD-10-CM | POA: Diagnosis not present

## 2021-02-13 ENCOUNTER — Ambulatory Visit (INDEPENDENT_AMBULATORY_CARE_PROVIDER_SITE_OTHER): Payer: Medicare Other | Admitting: Internal Medicine

## 2021-02-13 ENCOUNTER — Other Ambulatory Visit: Payer: Self-pay

## 2021-02-13 ENCOUNTER — Encounter: Payer: Self-pay | Admitting: Internal Medicine

## 2021-02-13 DIAGNOSIS — M4644 Discitis, unspecified, thoracic region: Secondary | ICD-10-CM

## 2021-02-13 MED ORDER — DOXYCYCLINE HYCLATE 100 MG PO TABS: 100.0000 mg | ORAL_TABLET | Freq: Two times a day (BID) | ORAL | 11 refills | Status: DC

## 2021-02-13 NOTE — Assessment & Plan Note (Signed)
I told him that there is certainly a good chance that his infection has been cured.  He will get repeat blood work today for his inflammatory markers, continue doxycycline for now and follow-up in 6 months

## 2021-02-13 NOTE — Progress Notes (Signed)
[facility]  Patient Active Problem List   Diagnosis Date Noted   MRSA bacteremia     Priority: High   Thoracic discitis     Priority: High   Constipation 05/07/2016    Priority: Medium   Protein-calorie malnutrition ([place]) 05/07/2016    Priority: Medium   Altered mental status 10/05/2020   Hyperglycemia 01/30/2016   Normocytic anemia 01/30/2016   Atherosclerotic peripheral vascular disease ([place]) 01/30/2016   Degenerative joint disease of spine 01/30/2016   BPH (benign prostatic hyperplasia) 01/30/2016   Rheumatoid arthritis involving multiple sites with positive rheumatoid factor ([place]) 01/29/2016   Essential hypertension 01/29/2016   Left adrenal mass ([place]) 01/29/2016    Patient's Medications  New Prescriptions   No medications on file  Previous Medications   ALFUZOSIN (UROXATRAL) 10 MG 24 HR TABLET    Take 10 mg by mouth every evening.   AMLODIPINE (NORVASC) 10 MG TABLET    Take 1 tablet (10 mg total) by mouth daily.   CALCIUM CITRATE-VITAMIN D (CITRACAL+D) 315-200 MG-UNIT TABLET    Take 1 tablet by mouth daily.   CHOLECALCIFEROL (VITAMIN D) 25 MCG (1000 UT) TABLET    Take 1,000 Units by mouth daily.   DOCUSATE SODIUM (COLACE) 250 MG CAPSULE    Take 250 mg by mouth 2 (two) times daily.   FERROUS SULFATE 90 (18 FE) MG TABS    Take 1 tablet by mouth daily.   FOLIC ACID (FOLVITE) 1 MG TABLET    Take 1 mg by mouth daily.   GOLIMUMAB (SIMPONI ARIA) 50 MG/4ML SOLN INJECTION    Inject 50 mg into the vein See admin instructions. Every 6-8 weeks   LISINOPRIL (ZESTRIL) 20 MG TABLET    Take 20 mg by mouth daily.   MELATONIN 5 MG TABS    Take 5 mg by mouth at bedtime as needed (sleep).   METHOTREXATE (RHEUMATREX) 2.5 MG TABLET    Take 10 mg by mouth every Thursday.   METOPROLOL SUCCINATE (TOPROL-XL) 50 MG 24 HR TABLET    Take 1 tablet (50 mg total) by mouth daily. Take with or immediately following a meal.   MULTIPLE VITAMIN (MULTIVITAMIN WITH  MINERALS) TABS TABLET    Take 1 tablet by mouth daily.   NAPROXEN SODIUM (ALEVE) 220 MG TABLET    Take 220 mg by mouth 2 (two) times daily as needed (pain.).   SULFASALAZINE (AZULFIDINE) 500 MG TABLET    Take 1,000 mg by mouth 2 (two) times daily.   VITAMIN B-12 (CYANOCOBALAMIN) 100 MCG TABLET    Take 100 mcg by mouth daily.  Modified Medications   Modified Medication Previous Medication   DOXYCYCLINE (VIBRA-TABS) 100 MG TABLET doxycycline (VIBRA-TABS) 100 MG tablet      Take 1 tablet (100 mg total) by mouth 2 (two) times daily. Give 2 hours before or 4 hours after iron.    Take 1 tablet (100 mg total) by mouth 2 (two) times daily. Give 2 hours before or 4 hours after iron.  Discontinued Medications   No medications on file    Subjective: Tanner Rice is in for his routine follow-up visit. He has not had any problems obtaining, taking or tolerating his doxycycline.  He developed MRSA bacteremia and thoracic discitis in August 2017 and had a relapse of his severe back pain after completing 2 months of antibiotic therapy.  He has been on chronic suppressive doxycycline ever since then.  He has not  had any fever, nausea, vomiting, diarrhea and is not having any back pain currently.  He remains reluctant to stop doxycycline given the early relapse and second hospitalization he suffered in 2017.  Review of Systems: Review of Systems  Constitutional:  Negative for chills, diaphoresis, fever and weight loss.  Gastrointestinal:  Positive for constipation. Negative for abdominal pain, diarrhea, nausea and vomiting.  Musculoskeletal:  Positive for joint pain. Negative for back pain.   Past Medical History:  Diagnosis Date   Hypertension    Melanoma ([place])    upper thigh on right leg   Rheumatoid arthritis(714.0)    Thoracic discitis     Social History   Tobacco Use   Smoking status: Former    Types: Pipe   Smokeless tobacco: Never   Tobacco comments:    quit 42 yrs. ago  Vaping Use   Vaping Use:  Never used  Substance Use Topics   Alcohol use: Yes    Comment: 4x weel glass wine   Drug use: No    Family History  Problem Relation Age of Onset   Prostate cancer Father    Prostate cancer Brother    Osteoarthritis Brother     Allergies  Allergen Reactions   Other Diarrhea, Nausea And Vomiting and Other (See Comments)    Seafood - mussels    Penicillins Cross Reactors Hives    Did it involve swelling of the face/tongue/throat, SOB, or low BP? Unknown Did it involve sudden or severe rash/hives, skin peeling, or any reaction on the inside of your mouth or nose? Unknown Did you need to seek medical attention at a hospital or doctor's office? Yes When did it last happen? over 65 years ago     If all above answers are "NO", may proceed with cephalosporin use. .    Objective: Vitals:   02/13/21 1431  BP: 118/67  Pulse: 70  Temp: 98 F (36.7 C)  TempSrc: Oral  SpO2: 97%  Weight: 154 lb 8 oz (70.1 kg)   Body mass index is 24.94 kg/m.  Physical Exam Constitutional:      Comments: He is in good spirits.  Cardiovascular:     Rate and Rhythm: Normal rate and regular rhythm.  Pulmonary:     Effort: Pulmonary effort is normal.     Breath sounds: Normal breath sounds.  Psychiatric:        Mood and Affect: Mood normal.    Lab Results Sed Rate (mm/hr)  Date Value  04/22/2016 82 (H)  02/05/2016 75 (H)   CRP  Date Value  04/22/2016 88.2 mg/L (H)  02/05/2016 13.2 mg/dL (H)      Problem List Items Addressed This Visit       High   Thoracic discitis    I told him that there is certainly a good chance that his infection has been cured.  He will get repeat blood work today for his inflammatory markers, continue doxycycline for now and follow-up in 6 months      Relevant Medications   doxycycline (VIBRA-TABS) 100 MG tablet   Other Relevant Orders   Sedimentation rate   C-reactive protein     Tanner Bickers, MD [facility] [place] [facility] [phone] pager   [phone] cell 02/13/2021, 2:54 PM

## 2021-02-14 LAB — SEDIMENTATION RATE: Sed Rate: 6 mm/h (ref 0–20)

## 2021-02-14 LAB — C-REACTIVE PROTEIN: CRP: 0.6 mg/L (ref <8.0)

## 2021-02-22 DIAGNOSIS — Z23 Encounter for immunization: Secondary | ICD-10-CM | POA: Diagnosis not present

## 2021-02-22 DIAGNOSIS — I10 Essential (primary) hypertension: Secondary | ICD-10-CM | POA: Diagnosis not present

## 2021-02-22 DIAGNOSIS — M069 Rheumatoid arthritis, unspecified: Secondary | ICD-10-CM | POA: Diagnosis not present

## 2021-03-12 ENCOUNTER — Other Ambulatory Visit: Payer: Self-pay | Admitting: Internal Medicine

## 2021-03-12 DIAGNOSIS — M4644 Discitis, unspecified, thoracic region: Secondary | ICD-10-CM

## 2021-03-14 DIAGNOSIS — M0579 Rheumatoid arthritis with rheumatoid factor of multiple sites without organ or systems involvement: Secondary | ICD-10-CM | POA: Diagnosis not present

## 2021-03-29 DIAGNOSIS — M0579 Rheumatoid arthritis with rheumatoid factor of multiple sites without organ or systems involvement: Secondary | ICD-10-CM | POA: Diagnosis not present

## 2021-03-29 DIAGNOSIS — Z79899 Other long term (current) drug therapy: Secondary | ICD-10-CM | POA: Diagnosis not present

## 2021-04-11 DIAGNOSIS — L720 Epidermal cyst: Secondary | ICD-10-CM | POA: Diagnosis not present

## 2021-04-11 DIAGNOSIS — Z85820 Personal history of malignant melanoma of skin: Secondary | ICD-10-CM | POA: Diagnosis not present

## 2021-04-11 DIAGNOSIS — L570 Actinic keratosis: Secondary | ICD-10-CM | POA: Diagnosis not present

## 2021-04-11 DIAGNOSIS — Z85828 Personal history of other malignant neoplasm of skin: Secondary | ICD-10-CM | POA: Diagnosis not present

## 2021-04-11 DIAGNOSIS — D1801 Hemangioma of skin and subcutaneous tissue: Secondary | ICD-10-CM | POA: Diagnosis not present

## 2021-04-11 DIAGNOSIS — L821 Other seborrheic keratosis: Secondary | ICD-10-CM | POA: Diagnosis not present

## 2021-04-16 DIAGNOSIS — R972 Elevated prostate specific antigen [PSA]: Secondary | ICD-10-CM | POA: Diagnosis not present

## 2021-05-14 DIAGNOSIS — M0579 Rheumatoid arthritis with rheumatoid factor of multiple sites without organ or systems involvement: Secondary | ICD-10-CM | POA: Diagnosis not present

## 2021-05-21 DIAGNOSIS — I10 Essential (primary) hypertension: Secondary | ICD-10-CM | POA: Diagnosis not present

## 2021-06-28 DIAGNOSIS — Z85820 Personal history of malignant melanoma of skin: Secondary | ICD-10-CM | POA: Diagnosis not present

## 2021-06-28 DIAGNOSIS — Z85828 Personal history of other malignant neoplasm of skin: Secondary | ICD-10-CM | POA: Diagnosis not present

## 2021-06-28 DIAGNOSIS — L570 Actinic keratosis: Secondary | ICD-10-CM | POA: Diagnosis not present

## 2021-06-29 ENCOUNTER — Other Ambulatory Visit: Payer: Self-pay

## 2021-06-29 ENCOUNTER — Emergency Department (HOSPITAL_BASED_OUTPATIENT_CLINIC_OR_DEPARTMENT_OTHER)
Admission: EM | Admit: 2021-06-29 | Discharge: 2021-06-29 | Disposition: A | Discharge status: Home / Self Care | Payer: Medicare Other | Attending: Emergency Medicine | Admitting: Emergency Medicine

## 2021-06-29 ENCOUNTER — Emergency Department (HOSPITAL_BASED_OUTPATIENT_CLINIC_OR_DEPARTMENT_OTHER): Payer: Medicare Other

## 2021-06-29 ENCOUNTER — Encounter (HOSPITAL_BASED_OUTPATIENT_CLINIC_OR_DEPARTMENT_OTHER): Payer: Self-pay | Admitting: *Deleted

## 2021-06-29 DIAGNOSIS — Z79899 Other long term (current) drug therapy: Secondary | ICD-10-CM | POA: Insufficient documentation

## 2021-06-29 DIAGNOSIS — U071 COVID-19: Secondary | ICD-10-CM | POA: Diagnosis not present

## 2021-06-29 DIAGNOSIS — R0989 Other specified symptoms and signs involving the circulatory and respiratory systems: Secondary | ICD-10-CM | POA: Diagnosis present

## 2021-06-29 DIAGNOSIS — R059 Cough, unspecified: Secondary | ICD-10-CM | POA: Diagnosis not present

## 2021-06-29 DIAGNOSIS — I10 Essential (primary) hypertension: Secondary | ICD-10-CM | POA: Insufficient documentation

## 2021-06-29 LAB — RESP PANEL BY RT-PCR (FLU A&B, COVID) ARPGX2
Influenza A by PCR: NEGATIVE
Influenza B by PCR: NEGATIVE
SARS Coronavirus 2 by RT PCR: POSITIVE — AB

## 2021-06-29 NOTE — ED Provider Notes (Signed)
[facility] EMERGENCY DEPARTMENT Provider Note   CSN: [id number] Arrival date & time: 06/29/21  1647     History  Chief Complaint  Patient presents with   Cough    Tanner Rice is a 88 y.o. male with a past medical history of hypertension presenting today with a complaint of chest congestion for 2 to 3 weeks.  Reports that his wife has also been sick.  Feels as though he is also fatigued and experiencing chills.  Denies chest pain or difficulty breathing.  No vomiting or diarrhea.  Home Medications Prior to Admission medications   Medication Sig Start Date End Date Taking? Authorizing Provider  alfuzosin (UROXATRAL) 10 MG 24 hr tablet Take 10 mg by mouth every evening. 10/20/18   [provider]  amLODipine (NORVASC) 10 MG tablet Take 1 tablet (10 mg total) by mouth daily. 02/07/16   Kelvin Cellar, MD  calcium citrate-vitamin D (CITRACAL+D) 315-200 MG-UNIT tablet Take 1 tablet by mouth daily.    [provider]  cholecalciferol (VITAMIN D) 25 MCG (1000 UT) tablet Take 1,000 Units by mouth daily.    [provider]  docusate sodium (COLACE) 250 MG capsule Take 250 mg by mouth 2 (two) times daily.    [provider]  doxycycline (VIBRA-TABS) 100 MG tablet TAKE ONE TABLET BY MOUTH TWICE DAILY 03/12/21   Michel Bickers, MD  Ferrous Sulfate 90 (18 Fe) MG TABS Take 1 tablet by mouth daily.    [provider]  folic acid (FOLVITE) 1 MG tablet Take 1 mg by mouth daily. 01/08/20   [provider]  golimumab (SIMPONI ARIA) 50 MG/4ML SOLN injection Inject 50 mg into the vein See admin instructions. Every 6-8 weeks 01/03/20   [provider]  lisinopril (ZESTRIL) 20 MG tablet Take 20 mg by mouth daily. 01/07/19   [provider]  Melatonin 5 MG TABS Take 5 mg by mouth at bedtime as needed (sleep).    [provider]  methotrexate (RHEUMATREX) 2.5 MG tablet Take 10 mg by mouth every Thursday. 01/31/20    [provider]  metoprolol succinate (TOPROL-XL) 50 MG 24 hr tablet Take 1 tablet (50 mg total) by mouth daily. Take with or immediately following a meal. Patient taking differently: Take 50 mg by mouth every evening. Take with or immediately following a meal. 02/07/16   Kelvin Cellar, MD  Multiple Vitamin (MULTIVITAMIN WITH MINERALS) TABS tablet Take 1 tablet by mouth daily.    [provider]  naproxen sodium (ALEVE) 220 MG tablet Take 220 mg by mouth 2 (two) times daily as needed (pain.).    [provider]  sulfaSALAzine (AZULFIDINE) 500 MG tablet Take 1,000 mg by mouth 2 (two) times daily.    [provider]  vitamin B-12 (CYANOCOBALAMIN) 100 MCG tablet Take 100 mcg by mouth daily.    [provider]      Allergies    Other and Penicillins cross reactors    Review of Systems   Review of Systems  Respiratory:  Positive for cough.   As per HPI Physical Exam Updated Vital Signs BP 124/85 (BP Location: Right Arm)    Pulse 77    Temp 99.3 F (37.4 C) (Oral)    Resp 20    Ht 5\' 6"  (1.676 m)    Wt 65.8 kg    SpO2 98%    BMI 23.40 kg/m  Physical Exam Vitals and nursing note reviewed.  Constitutional:  General: He is not in acute distress.    Appearance: Normal appearance. He is not ill-appearing.  HENT:     Head: Normocephalic and atraumatic.     Mouth/Throat:     Mouth: Mucous membranes are dry.     Pharynx: Oropharynx is clear.  Eyes:     General: No scleral icterus.    Conjunctiva/sclera: Conjunctivae normal.  Cardiovascular:     Rate and Rhythm: Normal rate and regular rhythm.  Pulmonary:     Effort: Pulmonary effort is normal. No respiratory distress.     Breath sounds: No wheezing.  Skin:    General: Skin is warm and dry.     Findings: No rash.  Neurological:     Mental Status: He is alert.  Psychiatric:        Mood and Affect: Mood normal.    ED Results / Procedures / Treatments   Labs (all labs ordered are  listed, but only abnormal results are displayed) Labs Reviewed  RESP PANEL BY RT-PCR (FLU A&B, COVID) ARPGX2 - Abnormal; Notable for the following components:      Result Value   SARS Coronavirus 2 by RT PCR POSITIVE (*)    All other components within normal limits    EKG None  Radiology DG Chest 2 View  Result Date: 06/29/2021 CLINICAL DATA:  Cough, congestion EXAM: CHEST - 2 VIEW COMPARISON:  10/05/2020 FINDINGS: Heart is normal size. Lungs clear. No effusions. No acute bony abnormality. IMPRESSION: No active cardiopulmonary disease. Electronically Signed   By: Rolm Baptise M.D.   On: 06/29/2021 17:24    Procedures Procedures    Medications Ordered in ED Medications - No data to display  ED Course/ Medical Decision Making/ A&P                           Medical Decision Making Amount and/or Complexity of Data Reviewed Radiology: ordered.   88 year old presenting with congestion for the past 2 to 3 weeks.  Denies any pain or shortness of breath.  Reports excessive fatigue and chills as well.  Testing: Patient had a chest x-ray that was individually interpreted by me and I agree with the radiologist that there are no signs of pneumonia or other abnormalities.  COVID-19 viral testing positive.  Patient is elderly however due to his ongoing symptoms, I will not treat him with antiviral.  Over-the-counter options were given to him, I believe he will benefit from Mucinex.  He says Sudafed has slightly helped however he continues with the symptoms and wanted to get checked out.  Vital signs stable and patient is ambulatory.  Discharged at this time.  Final Clinical Impression(s) / ED Diagnoses Final diagnoses:  COVID-19    Rx / DC Orders Results and diagnoses were explained to the patient. Return precautions discussed in full. Patient had no additional questions and expressed complete understanding.   This chart was dictated using voice recognition software.  Despite best  efforts to proofread,  errors can occur which can change the documentation meaning.    Rhae Hammock, PA-C 06/29/21 2015    Lucrezia Starch, MD 07/02/21 [phone]

## 2021-06-29 NOTE — ED Triage Notes (Signed)
Cough and chest congestion for a week.

## 2021-06-29 NOTE — Discharge Instructions (Signed)
Please do your best to stay away from others until your congestion clears up.  Mucinex is a good over-the-counter medication for your symptoms.  Return with any chest pain or difficulty breathing.  You may follow-up with your primary care provider if your symptoms or not improving over the next week.

## 2021-07-09 DIAGNOSIS — M0579 Rheumatoid arthritis with rheumatoid factor of multiple sites without organ or systems involvement: Secondary | ICD-10-CM | POA: Diagnosis not present

## 2021-07-18 DIAGNOSIS — H524 Presbyopia: Secondary | ICD-10-CM | POA: Diagnosis not present

## 2021-07-18 DIAGNOSIS — H52203 Unspecified astigmatism, bilateral: Secondary | ICD-10-CM | POA: Diagnosis not present

## 2021-07-18 DIAGNOSIS — Z961 Presence of intraocular lens: Secondary | ICD-10-CM | POA: Diagnosis not present

## 2021-07-18 DIAGNOSIS — H35343 Macular cyst, hole, or pseudohole, bilateral: Secondary | ICD-10-CM | POA: Diagnosis not present

## 2021-07-23 DIAGNOSIS — Z0000 Encounter for general adult medical examination without abnormal findings: Secondary | ICD-10-CM | POA: Diagnosis not present

## 2021-07-23 DIAGNOSIS — I700 Atherosclerosis of aorta: Secondary | ICD-10-CM | POA: Diagnosis not present

## 2021-07-23 DIAGNOSIS — K219 Gastro-esophageal reflux disease without esophagitis: Secondary | ICD-10-CM | POA: Diagnosis not present

## 2021-07-23 DIAGNOSIS — M069 Rheumatoid arthritis, unspecified: Secondary | ICD-10-CM | POA: Diagnosis not present

## 2021-07-23 DIAGNOSIS — Z1389 Encounter for screening for other disorder: Secondary | ICD-10-CM | POA: Diagnosis not present

## 2021-07-23 DIAGNOSIS — I10 Essential (primary) hypertension: Secondary | ICD-10-CM | POA: Diagnosis not present

## 2021-07-23 DIAGNOSIS — Z79899 Other long term (current) drug therapy: Secondary | ICD-10-CM | POA: Diagnosis not present

## 2021-08-10 DIAGNOSIS — Z79899 Other long term (current) drug therapy: Secondary | ICD-10-CM | POA: Diagnosis not present

## 2021-08-10 DIAGNOSIS — M0579 Rheumatoid arthritis with rheumatoid factor of multiple sites without organ or systems involvement: Secondary | ICD-10-CM | POA: Diagnosis not present

## 2021-08-14 ENCOUNTER — Other Ambulatory Visit: Payer: Self-pay

## 2021-08-14 ENCOUNTER — Encounter: Payer: Self-pay | Admitting: Internal Medicine

## 2021-08-14 ENCOUNTER — Ambulatory Visit (INDEPENDENT_AMBULATORY_CARE_PROVIDER_SITE_OTHER): Payer: Medicare Other | Admitting: Internal Medicine

## 2021-08-14 DIAGNOSIS — M4644 Discitis, unspecified, thoracic region: Secondary | ICD-10-CM

## 2021-08-14 MED ORDER — DOXYCYCLINE HYCLATE 100 MG PO TABS: 100.0000 mg | ORAL_TABLET | Freq: Two times a day (BID) | ORAL | 11 refills | Status: DC

## 2021-08-14 NOTE — Assessment & Plan Note (Signed)
He has no interest in stopping chronic, suppressive doxycycline. ?

## 2021-08-14 NOTE — Progress Notes (Signed)
?  ? ? ? ? ?[facility] ? ?Patient Active Problem List  ? Diagnosis Date Noted  ? MRSA bacteremia   ?  Priority: High  ? Thoracic discitis   ?  Priority: High  ? Constipation 05/07/2016  ?  Priority: Medium   ? Protein-calorie malnutrition ([place]) 05/07/2016  ?  Priority: Medium   ? Altered mental status 10/05/2020  ? Hyperglycemia 01/30/2016  ? Normocytic anemia 01/30/2016  ? Atherosclerotic peripheral vascular disease ([place]) 01/30/2016  ? Degenerative joint disease of spine 01/30/2016  ? BPH (benign prostatic hyperplasia) 01/30/2016  ? Rheumatoid arthritis involving multiple sites with positive rheumatoid factor ([place]) 01/29/2016  ? Essential hypertension 01/29/2016  ? Left adrenal mass ([place]) 01/29/2016  ? ? ?Patient's Medications  ?New Prescriptions  ? No medications on file  ?Previous Medications  ? ALFUZOSIN (UROXATRAL) 10 MG 24 HR TABLET    Take 10 mg by mouth every evening.  ? AMLODIPINE (NORVASC) 10 MG TABLET    Take 1 tablet (10 mg total) by mouth daily.  ? CALCIUM CITRATE-VITAMIN D (CITRACAL+D) 315-200 MG-UNIT TABLET    Take 1 tablet by mouth daily.  ? CHOLECALCIFEROL (VITAMIN D) 25 MCG (1000 UT) TABLET    Take 1,000 Units by mouth daily.  ? DOCUSATE SODIUM (COLACE) 250 MG CAPSULE    Take 250 mg by mouth 2 (two) times daily.  ? FERROUS SULFATE 90 (18 FE) MG TABS    Take 1 tablet by mouth daily.  ? FOLIC ACID (FOLVITE) 1 MG TABLET    Take 1 mg by mouth daily.  ? GOLIMUMAB (SIMPONI ARIA) 50 MG/4ML SOLN INJECTION    Inject 50 mg into the vein See admin instructions. Every 6-8 weeks  ? LISINOPRIL (ZESTRIL) 20 MG TABLET    Take 20 mg by mouth daily.  ? MELATONIN 5 MG TABS    Take 5 mg by mouth at bedtime as needed (sleep).  ? METHOTREXATE (RHEUMATREX) 2.5 MG TABLET    Take 10 mg by mouth every Thursday.  ? METOPROLOL SUCCINATE (TOPROL-XL) 50 MG 24 HR TABLET    Take 1 tablet (50 mg total) by mouth daily. Take with or immediately following a meal.  ? MULTIPLE VITAMIN (MULTIVITAMIN WITH  MINERALS) TABS TABLET    Take 1 tablet by mouth daily.  ? NAPROXEN SODIUM (ALEVE) 220 MG TABLET    Take 220 mg by mouth 2 (two) times daily as needed (pain.).  ? SULFASALAZINE (AZULFIDINE) 500 MG TABLET    Take 1,000 mg by mouth 2 (two) times daily.  ? VITAMIN B-12 (CYANOCOBALAMIN) 100 MCG TABLET    Take 100 mcg by mouth daily.  ?Modified Medications  ? Modified Medication Previous Medication  ? DOXYCYCLINE (VIBRA-TABS) 100 MG TABLET doxycycline (VIBRA-TABS) 100 MG tablet  ?    Take 1 tablet (100 mg total) by mouth 2 (two) times daily.    TAKE ONE TABLET BY MOUTH TWICE DAILY  ?Discontinued Medications  ? No medications on file  ? ? ?Subjective: ?Jaye is in for his routine follow-up visit. He has not had any problems obtaining, taking or tolerating his doxycycline.  He developed MRSA bacteremia and thoracic discitis in August 2017 and had a relapse of his severe back pain after completing 2 months of antibiotic therapy.  He has been on chronic suppressive doxycycline ever since then.  He has not had any fever, nausea, vomiting, diarrhea and is not having any back pain currently.  He remains reluctant to stop doxycycline  given the early relapse and second hospitalization he suffered in 2017.  ? ?He only occasionally has some brief twinges of back pain.  He recently started golimumab infusions for his rheumatoid arthritis.  He takes stool softener and he uses a glycerin suppository each morning to avoid constipation.  He has not had any problems with diarrhea. ? ?Review of Systems: ?Review of Systems  ?Constitutional:  Negative for chills, diaphoresis, fever and weight loss.  ?Gastrointestinal:  Positive for constipation. Negative for abdominal pain, diarrhea, nausea and vomiting.  ?Musculoskeletal:  Positive for back pain and joint pain.  ? ?Past Medical History:  ?Diagnosis Date  ? Hypertension   ? Melanoma ([place])   ? upper thigh on right leg  ? Rheumatoid arthritis(714.0)   ? Thoracic discitis   ? ? ?Social History   ? ?Tobacco Use  ? Smoking status: Former  ?  Types: Pipe  ? Smokeless tobacco: Never  ? Tobacco comments:  ?  quit 42 yrs. ago  ?Vaping Use  ? Vaping Use: Never used  ?Substance Use Topics  ? Alcohol use: Yes  ?  Comment: 4x weel glass wine  ? Drug use: No  ? ? ?Family History  ?Problem Relation Age of Onset  ? Prostate cancer Father   ? Prostate cancer Brother   ? Osteoarthritis Brother   ? ? ?Allergies  ?Allergen Reactions  ? Other Diarrhea, Nausea And Vomiting and Other (See Comments)  ?  Seafood - mussels   ? Penicillins Cross Reactors Hives  ?  Did it involve swelling of the face/tongue/throat, SOB, or low BP? Unknown ?Did it involve sudden or severe rash/hives, skin peeling, or any reaction on the inside of your mouth or nose? Unknown ?Did you need to seek medical attention at a hospital or doctor's office? Yes ?When did it last happen? over 65 years ago     ?If all above answers are ?NO?, may proceed with cephalosporin use. ?.  ? ? ?Objective: ?Vitals:  ? 08/14/21 1422  ?BP: 137/67  ?Pulse: 60  ?Temp: 97.6 ?F (36.4 ?C)  ?TempSrc: Oral  ?Weight: 154 lb (69.9 kg)  ? ?Body mass index is 24.86 kg/m?. ? ?Physical Exam ?Constitutional:   ?   Comments: He is in good spirits.  ?Cardiovascular:  ?   Rate and Rhythm: Normal rate and regular rhythm.  ?   Heart sounds: No murmur heard. ?Pulmonary:  ?   Effort: Pulmonary effort is normal.  ?   Breath sounds: Normal breath sounds.  ?Neurological:  ?   Gait: Gait normal.  ?Psychiatric:     ?   Mood and Affect: Mood normal.  ? ? ?Lab Results ?Sed Rate  ?Date Value  ?02/13/2021 6 mm/h  ?04/22/2016 82 mm/hr (H)  ?02/05/2016 75 mm/hr (H)  ? ?CRP  ?Date Value  ?02/13/2021 0.6 mg/L  ?04/22/2016 88.2 mg/L (H)  ?02/05/2016 13.2 mg/dL (H)  ?  ?  ?Problem List Items Addressed This Visit   ? ?  ? High  ? Thoracic discitis  ?  He has no interest in stopping chronic, suppressive doxycycline. ?  ?  ? Relevant Medications  ? doxycycline (VIBRA-TABS) 100 MG tablet  ? Other Relevant Orders   ? CBC  ? Basic metabolic panel  ? Sedimentation rate  ? C-reactive protein  ? ? ? ?Michel Bickers, MD ?[facility] ?[place] ?[phone] pager   [phone] cell ?08/14/2021, 2:57 PM ?

## 2021-08-15 LAB — BASIC METABOLIC PANEL
BUN: 22 mg/dL (ref 7–25)
CO2: 27 mmol/L (ref 20–32)
Calcium: 9.7 mg/dL (ref 8.6–10.3)
Chloride: 104 mmol/L (ref 98–110)
Creat: 0.9 mg/dL (ref 0.70–1.22)
Glucose, Bld: 97 mg/dL (ref 65–99)
Potassium: 4.7 mmol/L (ref 3.5–5.3)
Sodium: 137 mmol/L (ref 135–146)

## 2021-08-15 LAB — CBC
HCT: 32.2 % — ABNORMAL LOW (ref 38.5–50.0)
Hemoglobin: 10.6 g/dL — ABNORMAL LOW (ref 13.2–17.1)
MCH: 31.9 pg (ref 27.0–33.0)
MCHC: 32.9 g/dL (ref 32.0–36.0)
MCV: 97 fL (ref 80.0–100.0)
MPV: 10.3 fL (ref 7.5–12.5)
Platelets: 208 10*3/uL (ref 140–400)
RBC: 3.32 10*6/uL — ABNORMAL LOW (ref 4.20–5.80)
RDW: 14.9 % (ref 11.0–15.0)
WBC: 5.7 10*3/uL (ref 3.8–10.8)

## 2021-08-15 LAB — SEDIMENTATION RATE: Sed Rate: 9 mm/h (ref 0–20)

## 2021-08-15 LAB — C-REACTIVE PROTEIN: CRP: 0.5 mg/L (ref <8.0)

## 2021-09-04 DIAGNOSIS — M0579 Rheumatoid arthritis with rheumatoid factor of multiple sites without organ or systems involvement: Secondary | ICD-10-CM | POA: Diagnosis not present

## 2021-10-02 DIAGNOSIS — R972 Elevated prostate specific antigen [PSA]: Secondary | ICD-10-CM | POA: Diagnosis not present

## 2021-10-08 DIAGNOSIS — R351 Nocturia: Secondary | ICD-10-CM | POA: Diagnosis not present

## 2021-10-08 DIAGNOSIS — C61 Malignant neoplasm of prostate: Secondary | ICD-10-CM | POA: Diagnosis not present

## 2021-10-08 DIAGNOSIS — N401 Enlarged prostate with lower urinary tract symptoms: Secondary | ICD-10-CM | POA: Diagnosis not present

## 2021-11-07 DIAGNOSIS — M0579 Rheumatoid arthritis with rheumatoid factor of multiple sites without organ or systems involvement: Secondary | ICD-10-CM | POA: Diagnosis not present

## 2021-12-27 ENCOUNTER — Other Ambulatory Visit: Payer: Self-pay | Admitting: General Surgery

## 2021-12-27 DIAGNOSIS — K4090 Unilateral inguinal hernia, without obstruction or gangrene, not specified as recurrent: Secondary | ICD-10-CM | POA: Diagnosis not present

## 2021-12-28 DIAGNOSIS — M0579 Rheumatoid arthritis with rheumatoid factor of multiple sites without organ or systems involvement: Secondary | ICD-10-CM | POA: Diagnosis not present

## 2021-12-28 DIAGNOSIS — Z79899 Other long term (current) drug therapy: Secondary | ICD-10-CM | POA: Diagnosis not present

## 2022-01-02 DIAGNOSIS — M19072 Primary osteoarthritis, left ankle and foot: Secondary | ICD-10-CM | POA: Diagnosis not present

## 2022-01-02 DIAGNOSIS — M85872 Other specified disorders of bone density and structure, left ankle and foot: Secondary | ICD-10-CM | POA: Diagnosis not present

## 2022-01-02 DIAGNOSIS — M19071 Primary osteoarthritis, right ankle and foot: Secondary | ICD-10-CM | POA: Diagnosis not present

## 2022-01-23 DIAGNOSIS — I10 Essential (primary) hypertension: Secondary | ICD-10-CM | POA: Diagnosis not present

## 2022-02-05 NOTE — Progress Notes (Signed)
Surgical Instructions    Your procedure is scheduled on Thursday, 02/14/22.  Report to [facility] Main Entrance "A" at 5:30 A.M., then check in with the Admitting office.  Call this number if you have problems the morning of surgery:  [phone]   If you have any questions prior to your surgery date call [phone]: Open Monday-Friday 8am-4pm    Remember:  Do not eat after midnight the night before your surgery  You may drink clear liquids until 4:30am the morning of your surgery.   Clear liquids allowed are: Water, Non-Citrus Juices (without pulp), Carbonated Beverages, Clear Tea, Black Coffee ONLY (NO MILK, CREAM OR POWDERED CREAMER of any kind), and Gatorade    Take these medicines the morning of surgery with A SIP OF WATER:  amLODipine (NORVASC) doxycycline (VIBRA-TABS)  finasteride (PROSCAR)  iron polysaccharides (FERREX 150)  metoprolol succinate (TOPROL-XL)   IF NEEDED: acetaminophen (TYLENOL)  As of today, STOP taking any Aspirin (unless otherwise instructed by your surgeon) Aleve, Naproxen, Ibuprofen, Motrin, Advil, Goody's, BC's, all herbal medications, fish oil, diclofenac Sodium (VOLTAREN) 1 % GEL and all vitamins.  Please stop taking methotrexate (RHEUMATREX) and golimumab (SIMPONI ARIA) 3 days prior to surgery.           Do not wear jewelry or makeup. Do not wear lotions, powders, perfumes/cologne or deodorant. Men may shave face and neck. Do not bring valuables to the hospital. Do not wear nail polish, gel polish, artificial nails, or any other type of covering on natural nails (fingers and toes) If you have artificial nails or gel coating that need to be removed by a nail salon, please have this removed prior to surgery. Artificial nails or gel coating may interfere with anesthesia's ability to adequately monitor your vital signs.  [facility] is not responsible for any belongings or valuables.    Do NOT Smoke (Tobacco/Vaping)  24 hours prior to your  procedure  If you use a CPAP at night, you may bring your mask for your overnight stay.   Contacts, glasses, hearing aids, dentures or partials may not be worn into surgery, please bring cases for these belongings   For patients admitted to the hospital, discharge time will be determined by your treatment team.   Patients discharged the day of surgery will not be allowed to drive home, and someone needs to stay with them for 24 hours.   SURGICAL WAITING ROOM VISITATION Patients having surgery or a procedure may have no more than 2 support people in the waiting area - these visitors may rotate.   Children under the age of 41 must have an adult with them who is not the patient. If the patient needs to stay at the hospital during part of their recovery, the visitor guidelines for inpatient rooms apply. Pre-op nurse will coordinate an appropriate time for 1 support person to accompany patient in pre-op.  This support person may not rotate.   Please refer to the [facility] website for the visitor guidelines for Inpatients (after your surgery is over and you are in a regular room).    Special instructions:    Oral Hygiene is also important to reduce your risk of infection.  Remember - BRUSH YOUR TEETH THE MORNING OF SURGERY WITH YOUR REGULAR TOOTHPASTE   [facility]- Preparing For Surgery  Before surgery, you can play an important role. Because skin is not sterile, your skin needs to be as free of germs as possible. You can reduce the number of germs  on your skin by washing with CHG (chlorahexidine gluconate) Soap before surgery.  CHG is an antiseptic cleaner which kills germs and bonds with the skin to continue killing germs even after washing.     Please do not use if you have an allergy to CHG or antibacterial soaps. If your skin becomes reddened/irritated stop using the CHG.  Do not shave (including legs and underarms) for at least 48 hours prior to first CHG shower. It is OK to shave  your face.  Please follow these instructions carefully.     Shower the NIGHT BEFORE SURGERY and the MORNING OF SURGERY with CHG Soap.   If you chose to wash your hair, wash your hair first as usual with your normal shampoo. After you shampoo, rinse your hair and body thoroughly to remove the shampoo.  Then [employer] and genitals (private parts) with your normal soap and rinse thoroughly to remove soap.  After that Use CHG Soap as you would any other liquid soap. You can apply CHG directly to the skin and wash gently with a scrungie or a clean washcloth.   Apply the CHG Soap to your body ONLY FROM THE NECK DOWN.  Do not use on open wounds or open sores. Avoid contact with your eyes, ears, mouth and genitals (private parts). Wash Face and genitals (private parts)  with your normal soap.   Wash thoroughly, paying special attention to the area where your surgery will be performed.  Thoroughly rinse your body with warm water from the neck down.  DO NOT shower/wash with your normal soap after using and rinsing off the CHG Soap.  Pat yourself dry with a CLEAN TOWEL.  Wear CLEAN PAJAMAS to bed the night before surgery  Place CLEAN SHEETS on your bed the night before your surgery  DO NOT SLEEP WITH PETS.   Day of Surgery: Take a shower with CHG soap. Wear Clean/Comfortable clothing the morning of surgery Do not apply any deodorants/lotions.   Remember to brush your teeth WITH YOUR REGULAR TOOTHPASTE.    If you received a COVID test during your pre-op visit, it is requested that you wear a mask when out in public, stay away from anyone that may not be feeling well, and notify your surgeon if you develop symptoms. If you have been in contact with anyone that has tested positive in the last 10 days, please notify your surgeon.    Please read over the following fact sheets that you were given.

## 2022-02-06 ENCOUNTER — Other Ambulatory Visit: Payer: Self-pay

## 2022-02-06 ENCOUNTER — Encounter (HOSPITAL_COMMUNITY): Payer: Self-pay

## 2022-02-06 ENCOUNTER — Encounter (HOSPITAL_COMMUNITY)
Admission: RE | Admit: 2022-02-06 | Discharge: 2022-02-06 | Disposition: A | Discharge status: Home / Self Care | Payer: Medicare Other | Source: Ambulatory Visit | Attending: General Surgery | Admitting: General Surgery

## 2022-02-06 VITALS — BP 121/61 | HR 50 | Temp 97.5°F | Resp 18 | Ht 66.0 in | Wt 150.8 lb

## 2022-02-06 DIAGNOSIS — I440 Atrioventricular block, first degree: Secondary | ICD-10-CM | POA: Diagnosis not present

## 2022-02-06 DIAGNOSIS — R001 Bradycardia, unspecified: Secondary | ICD-10-CM | POA: Diagnosis not present

## 2022-02-06 DIAGNOSIS — I2510 Atherosclerotic heart disease of native coronary artery without angina pectoris: Secondary | ICD-10-CM | POA: Insufficient documentation

## 2022-02-06 DIAGNOSIS — I10 Essential (primary) hypertension: Secondary | ICD-10-CM | POA: Insufficient documentation

## 2022-02-06 DIAGNOSIS — I4510 Unspecified right bundle-branch block: Secondary | ICD-10-CM | POA: Insufficient documentation

## 2022-02-06 DIAGNOSIS — Z01818 Encounter for other preprocedural examination: Secondary | ICD-10-CM | POA: Diagnosis not present

## 2022-02-06 LAB — CBC
HCT: 38.1 % — ABNORMAL LOW (ref 39.0–52.0)
Hemoglobin: 12.7 g/dL — ABNORMAL LOW (ref 13.0–17.0)
MCH: 33 pg (ref 26.0–34.0)
MCHC: 33.3 g/dL (ref 30.0–36.0)
MCV: 99 fL (ref 80.0–100.0)
Platelets: 167 10*3/uL (ref 150–400)
RBC: 3.85 MIL/uL — ABNORMAL LOW (ref 4.22–5.81)
RDW: 13.2 % (ref 11.5–15.5)
WBC: 5 10*3/uL (ref 4.0–10.5)
nRBC: 0 % (ref 0.0–0.2)

## 2022-02-06 LAB — BASIC METABOLIC PANEL
Anion gap: 10 (ref 5–15)
BUN: 14 mg/dL (ref 8–23)
CO2: 25 mmol/L (ref 22–32)
Calcium: 10.1 mg/dL (ref 8.9–10.3)
Chloride: 103 mmol/L (ref 98–111)
Creatinine, Ser: 0.8 mg/dL (ref 0.61–1.24)
GFR, Estimated: >60 mL/min (ref >60)
Glucose, Bld: 99 mg/dL (ref 70–99)
Potassium: 3.8 mmol/L (ref 3.5–5.1)
Sodium: 138 mmol/L (ref 135–145)

## 2022-02-06 NOTE — Progress Notes (Addendum)
PCP - Dr. Lajean Manes Cardiologist - Denies  PPM/ICD - Denies Device Orders - n/a Rep Notified - n/a  Chest x-ray - 06/29/2021 EKG - 02/06/2022 Stress Test - Denies ECHO - 10/06/2020 Cardiac Cath - Denies  Sleep Study - Denies CPAP - n/a  No DM  Blood Thinner Instructions: n/a Aspirin Instructions: n/a  ERAS Protcol - Clear liquids until 0430 morning of surgery PRE-SURGERY Ensure or G2- n/a. None ordered  COVID TEST- n/a   Anesthesia review: Yes. Abnormal EKG. Discussed with Karoline Caldwell, PA-C  Patient denies shortness of breath, fever, cough and chest pain at PAT appointment   All instructions explained to the patient, with a verbal understanding of the material. Patient agrees to go over the instructions while at home for a better understanding. Patient also instructed to self quarantine after being tested for COVID-19. The opportunity to ask questions was provided.

## 2022-02-07 NOTE — Progress Notes (Signed)
Anesthesia Chart Review:  I was called to review preop EKG.  Notable for sinus bradycardia at a rate of 47 with first-degree AV block and right bundle branch block.  Patient asymptomatic.  Bradycardia and first-degree AV block appears new compared with previous tracings.  Patient is on metoprolol for management of hypertension.  I did reach out to patient's PCP Dr. Lajean Manes to discuss use of nodal blocking agent and patient with bradycardia and first-degree AV block.  Dr. Felipa Eth felt okay for patient to continue metoprolol for management hypertension.  Patient takes his dose in the evening and Dr. Felipa Eth advised patient could hold this on the day prior to surgery.  I reached out to the patient to relay this recommendation.  History of rheumatoid arthritis maintained on golimumab and methotrexate.  History of thoracic discitis, followed by infectious disease, maintained on chronic suppressive therapy with doxycycline.  Preop labs reviewed, mild anemia with hemoglobin 12.7, otherwise unremarkable.    [facility] Short Stay Center/Anesthesiology Phone [phone] 02/07/2022 1:12 PM

## 2022-02-07 NOTE — Anesthesia Preprocedure Evaluation (Addendum)
Anesthesia Evaluation  Patient identified by MRN, date of birth, ID band Patient awake    Reviewed: Allergy & Precautions, NPO status , Patient's Chart, lab work & pertinent test results  History of Anesthesia Complications Negative for: history of anesthetic complications  Airway Mallampati: II  TM Distance: >3 FB Neck ROM: Full    Dental  (+) Dental Advisory Given, Edentulous Upper, Edentulous Lower   Pulmonary neg pulmonary ROS, former smoker,    Pulmonary exam normal        Cardiovascular hypertension, Pt. on home beta blockers and Pt. on medications + Peripheral Vascular Disease  Normal cardiovascular exam  Echo 10/06/20: EF 65-70%, no RWMA, mod LVH, g1dd, normal RVSF, trivial MR, mod calcification of the AV, no AR/AS   Neuro/Psych negative neurological ROS     GI/Hepatic negative GI ROS, Neg liver ROS,   Endo/Other  negative endocrine ROS  Renal/GU negative Renal ROS  negative genitourinary   Musculoskeletal  (+) Arthritis , Rheumatoid disorders,    Abdominal   Peds  Hematology  (+) Blood dyscrasia, anemia ,   Anesthesia Other Findings   Reproductive/Obstetrics                          Anesthesia Physical Anesthesia Plan  ASA: 2  Anesthesia Plan: General   Post-op Pain Management: Regional block* and Tylenol PO (pre-op)*   Induction: Intravenous  PONV Risk Score and Plan: 2 and Ondansetron, Dexamethasone, Midazolam and Treatment may vary due to age or medical condition  Airway Management Planned: LMA  Additional Equipment: None  Intra-op Plan:   Post-operative Plan: Extubation in OR  Informed Consent: I have reviewed the patients History and Physical, chart, labs and discussed the procedure including the risks, benefits and alternatives for the proposed anesthesia with the patient or authorized representative who has indicated his/her understanding and acceptance.      Dental advisory given  Plan Discussed with:   Anesthesia Plan Comments: (PAT note by Karoline Caldwell, PA-C: I was called to review preop EKG.  Notable for sinus bradycardia at a rate of 47 with first-degree AV block and right bundle branch block.  Patient asymptomatic.  Bradycardia and first-degree AV block appears new compared with previous tracings.  Patient is on metoprolol for management of hypertension.  I did reach out to patient's PCP Dr. Lajean Manes to discuss use of nodal blocking agent and patient with bradycardia and first-degree AV block.  Dr. Felipa Eth felt okay for patient to continue metoprolol for management hypertension.  Patient takes his dose in the evening and Dr. Felipa Eth advised patient could hold this on the day prior to surgery.  I reached out to the patient to relay this recommendation.  History of rheumatoid arthritis maintained on golimumab and methotrexate.  History of thoracic discitis, followed by infectious disease, maintained on chronic suppressive therapy with doxycycline.  Preop labs reviewed, mild anemia with hemoglobin 12.7, otherwise unremarkable. )      Anesthesia Quick Evaluation

## 2022-02-14 ENCOUNTER — Other Ambulatory Visit: Payer: Self-pay

## 2022-02-14 ENCOUNTER — Encounter (HOSPITAL_COMMUNITY): Payer: Self-pay | Admitting: General Surgery

## 2022-02-14 ENCOUNTER — Ambulatory Visit (HOSPITAL_BASED_OUTPATIENT_CLINIC_OR_DEPARTMENT_OTHER): Payer: Medicare Other | Admitting: Anesthesiology

## 2022-02-14 ENCOUNTER — Encounter (HOSPITAL_COMMUNITY)
Admission: RE | Disposition: A | Discharge status: Home / Self Care | Payer: Self-pay | Source: Home / Self Care | Attending: General Surgery

## 2022-02-14 ENCOUNTER — Ambulatory Visit (HOSPITAL_COMMUNITY)
Admission: RE | Admit: 2022-02-14 | Discharge: 2022-02-14 | Disposition: A | Discharge status: Home / Self Care | Payer: Medicare Other | Attending: General Surgery | Admitting: General Surgery

## 2022-02-14 ENCOUNTER — Ambulatory Visit (HOSPITAL_COMMUNITY): Payer: Medicare Other | Admitting: Physician Assistant

## 2022-02-14 DIAGNOSIS — I739 Peripheral vascular disease, unspecified: Secondary | ICD-10-CM | POA: Insufficient documentation

## 2022-02-14 DIAGNOSIS — G8918 Other acute postprocedural pain: Secondary | ICD-10-CM | POA: Diagnosis not present

## 2022-02-14 DIAGNOSIS — Z87891 Personal history of nicotine dependence: Secondary | ICD-10-CM | POA: Insufficient documentation

## 2022-02-14 DIAGNOSIS — K4090 Unilateral inguinal hernia, without obstruction or gangrene, not specified as recurrent: Secondary | ICD-10-CM | POA: Diagnosis not present

## 2022-02-14 DIAGNOSIS — I10 Essential (primary) hypertension: Secondary | ICD-10-CM | POA: Diagnosis not present

## 2022-02-14 DIAGNOSIS — M0579 Rheumatoid arthritis with rheumatoid factor of multiple sites without organ or systems involvement: Secondary | ICD-10-CM | POA: Insufficient documentation

## 2022-02-14 DIAGNOSIS — I700 Atherosclerosis of aorta: Secondary | ICD-10-CM | POA: Insufficient documentation

## 2022-02-14 DIAGNOSIS — Z79899 Other long term (current) drug therapy: Secondary | ICD-10-CM | POA: Insufficient documentation

## 2022-02-14 HISTORY — PX: INSERTION OF MESH: SHX5868

## 2022-02-14 HISTORY — PX: INGUINAL HERNIA REPAIR: SHX194

## 2022-02-14 SURGERY — REPAIR, HERNIA, INGUINAL, ADULT
Anesthesia: General | Site: Groin | Laterality: Right

## 2022-02-14 MED ORDER — AMISULPRIDE (ANTIEMETIC) 5 MG/2ML IV SOLN: 10.0000 mg | Freq: Once | INTRAVENOUS | Status: DC | PRN

## 2022-02-14 MED ORDER — PHENYLEPHRINE 80 MCG/ML (10ML) SYRINGE FOR IV PUSH (FOR BLOOD PRESSURE SUPPORT)
PREFILLED_SYRINGE | INTRAVENOUS | Status: DC | PRN
  Administered 2022-02-14 (×2): 80 ug via INTRAVENOUS

## 2022-02-14 MED ORDER — ROPIVACAINE HCL 5 MG/ML IJ SOLN
INTRAMUSCULAR | Status: DC | PRN
  Administered 2022-02-14: 30 mL via PERINEURAL

## 2022-02-14 MED ORDER — BUPIVACAINE HCL (PF) 0.25 % IJ SOLN
INTRAMUSCULAR | Status: AC
  Filled 2022-02-14: qty 30

## 2022-02-14 MED ORDER — BUPIVACAINE HCL (PF) 0.25 % IJ SOLN
INTRAMUSCULAR | Status: DC | PRN
  Administered 2022-02-14: 2 mL

## 2022-02-14 MED ORDER — ONDANSETRON HCL 4 MG/2ML IJ SOLN
INTRAMUSCULAR | Status: DC | PRN
  Administered 2022-02-14: 4 mg via INTRAVENOUS

## 2022-02-14 MED ORDER — TRAMADOL HCL 50 MG PO TABS: 50.0000 mg | ORAL_TABLET | Freq: Four times a day (QID) | ORAL | 0 refills | Status: DC | PRN

## 2022-02-14 MED ORDER — LIDOCAINE 2% (20 MG/ML) 5 ML SYRINGE
INTRAMUSCULAR | Status: DC | PRN
  Administered 2022-02-14: 60 mg via INTRAVENOUS

## 2022-02-14 MED ORDER — LACTATED RINGERS IV SOLN: INTRAVENOUS | Status: DC

## 2022-02-14 MED ORDER — FENTANYL CITRATE (PF) 250 MCG/5ML IJ SOLN
INTRAMUSCULAR | Status: AC
  Filled 2022-02-14: qty 5

## 2022-02-14 MED ORDER — OXYCODONE HCL 5 MG PO TABS
ORAL_TABLET | ORAL | Status: AC
  Filled 2022-02-14: qty 1

## 2022-02-14 MED ORDER — FENTANYL CITRATE (PF) 100 MCG/2ML IJ SOLN
25.0000 ug | INTRAMUSCULAR | Status: DC | PRN
  Administered 2022-02-14: 25 ug via INTRAVENOUS

## 2022-02-14 MED ORDER — LIDOCAINE 2% (20 MG/ML) 5 ML SYRINGE
INTRAMUSCULAR | Status: AC
  Filled 2022-02-14: qty 5

## 2022-02-14 MED ORDER — EPHEDRINE SULFATE-NACL 50-0.9 MG/10ML-% IV SOSY
PREFILLED_SYRINGE | INTRAVENOUS | Status: DC | PRN
  Administered 2022-02-14 (×4): 5 mg via INTRAVENOUS

## 2022-02-14 MED ORDER — OXYCODONE HCL 5 MG/5ML PO SOLN: 5.0000 mg | Freq: Once | ORAL | Status: AC | PRN

## 2022-02-14 MED ORDER — CHLORHEXIDINE GLUCONATE CLOTH 2 % EX PADS: 6.0000 | MEDICATED_PAD | Freq: Once | CUTANEOUS | Status: DC

## 2022-02-14 MED ORDER — EPHEDRINE 5 MG/ML INJ
INTRAVENOUS | Status: AC
  Filled 2022-02-14: qty 5

## 2022-02-14 MED ORDER — DEXAMETHASONE SODIUM PHOSPHATE 10 MG/ML IJ SOLN
INTRAMUSCULAR | Status: AC
  Filled 2022-02-14: qty 1

## 2022-02-14 MED ORDER — ONDANSETRON HCL 4 MG/2ML IJ SOLN: 4.0000 mg | Freq: Once | INTRAMUSCULAR | Status: DC | PRN

## 2022-02-14 MED ORDER — FENTANYL CITRATE (PF) 100 MCG/2ML IJ SOLN
INTRAMUSCULAR | Status: AC
  Filled 2022-02-14: qty 2

## 2022-02-14 MED ORDER — DEXAMETHASONE SODIUM PHOSPHATE 4 MG/ML IJ SOLN
INTRAMUSCULAR | Status: DC | PRN
  Administered 2022-02-14: 4 mg via INTRAVENOUS

## 2022-02-14 MED ORDER — CIPROFLOXACIN IN D5W 400 MG/200ML IV SOLN
400.0000 mg | INTRAVENOUS | Status: AC
  Administered 2022-02-14: 400 mg via INTRAVENOUS
  Filled 2022-02-14: qty 200

## 2022-02-14 MED ORDER — CHLORHEXIDINE GLUCONATE 0.12 % MT SOLN
15.0000 mL | Freq: Once | OROMUCOSAL | Status: AC
  Administered 2022-02-14: 15 mL via OROMUCOSAL
  Filled 2022-02-14: qty 15

## 2022-02-14 MED ORDER — OXYCODONE HCL 5 MG PO TABS
5.0000 mg | ORAL_TABLET | Freq: Once | ORAL | Status: AC | PRN
  Administered 2022-02-14: 5 mg via ORAL

## 2022-02-14 MED ORDER — ACETAMINOPHEN 500 MG PO TABS: 1000.0000 mg | ORAL_TABLET | Freq: Once | ORAL | Status: DC

## 2022-02-14 MED ORDER — PROPOFOL 10 MG/ML IV BOLUS
INTRAVENOUS | Status: DC | PRN
  Administered 2022-02-14: 140 mg via INTRAVENOUS

## 2022-02-14 MED ORDER — ONDANSETRON HCL 4 MG/2ML IJ SOLN
INTRAMUSCULAR | Status: AC
  Filled 2022-02-14: qty 2

## 2022-02-14 MED ORDER — ORAL CARE MOUTH RINSE: 15.0000 mL | Freq: Once | OROMUCOSAL | Status: AC

## 2022-02-14 MED ORDER — FENTANYL CITRATE (PF) 100 MCG/2ML IJ SOLN
INTRAMUSCULAR | Status: DC | PRN
  Administered 2022-02-14 (×2): 50 ug via INTRAVENOUS

## 2022-02-14 MED ORDER — 0.9 % SODIUM CHLORIDE (POUR BTL) OPTIME
TOPICAL | Status: DC | PRN
  Administered 2022-02-14: 1000 mL

## 2022-02-14 MED ORDER — ACETAMINOPHEN 500 MG PO TABS
1000.0000 mg | ORAL_TABLET | ORAL | Status: AC
  Administered 2022-02-14: 1000 mg via ORAL
  Filled 2022-02-14: qty 2

## 2022-02-14 SURGICAL SUPPLY — 39 items
BAG COUNTER SPONGE SURGICOUNT (BAG) ×1 IMPLANT
BLADE CLIPPER SURG (BLADE) IMPLANT
CANISTER SUCT 3000ML PPV (MISCELLANEOUS) IMPLANT
CHLORAPREP W/TINT 26 (MISCELLANEOUS) ×1 IMPLANT
COVER SURGICAL LIGHT HANDLE (MISCELLANEOUS) ×1 IMPLANT
DERMABOND ADVANCED (GAUZE/BANDAGES/DRESSINGS) ×1
DERMABOND ADVANCED .7 DNX12 (GAUZE/BANDAGES/DRESSINGS) ×1 IMPLANT
DRAIN PENROSE 1/2X12 LTX STRL (WOUND CARE) IMPLANT
DRAPE LAPAROTOMY TRNSV 102X78 (DRAPES) ×1 IMPLANT
ELECT CAUTERY BLADE 6.4 (BLADE) ×1 IMPLANT
ELECT REM PT RETURN 9FT ADLT (ELECTROSURGICAL) ×1
ELECTRODE REM PT RTRN 9FT ADLT (ELECTROSURGICAL) ×1 IMPLANT
GAUZE 4X4 16PLY ~~LOC~~+RFID DBL (SPONGE) ×1 IMPLANT
GLOVE BIO SURGEON STRL SZ7 (GLOVE) ×1 IMPLANT
GLOVE BIOGEL PI IND STRL 7.5 (GLOVE) ×1 IMPLANT
GOWN STRL REUS W/ TWL LRG LVL3 (GOWN DISPOSABLE) ×2 IMPLANT
GOWN STRL REUS W/TWL LRG LVL3 (GOWN DISPOSABLE) ×2
KIT BASIN OR (CUSTOM PROCEDURE TRAY) ×1 IMPLANT
KIT TURNOVER KIT B (KITS) ×1 IMPLANT
MARKER SKIN DUAL TIP RULER LAB (MISCELLANEOUS) ×1 IMPLANT
MESH ULTRAPRO 3X6 7.6X15CM (Mesh General) IMPLANT
NDL HYPO 25GX1X1/2 BEV (NEEDLE) ×1 IMPLANT
NEEDLE HYPO 25GX1X1/2 BEV (NEEDLE) ×1 IMPLANT
NS IRRIG 1000ML POUR BTL (IV SOLUTION) ×1 IMPLANT
PACK GENERAL/GYN (CUSTOM PROCEDURE TRAY) ×1 IMPLANT
PAD ARMBOARD 7.5X6 YLW CONV (MISCELLANEOUS) ×1 IMPLANT
PENCIL SMOKE EVACUATOR (MISCELLANEOUS) ×1 IMPLANT
SPIKE FLUID TRANSFER (MISCELLANEOUS) IMPLANT
STRIP CLOSURE SKIN 1/2X4 (GAUZE/BANDAGES/DRESSINGS) IMPLANT
SUT MNCRL AB 4-0 PS2 18 (SUTURE) ×1 IMPLANT
SUT VIC AB 2-0 CT1 27 (SUTURE) ×2
SUT VIC AB 2-0 CT1 TAPERPNT 27 (SUTURE) ×1 IMPLANT
SUT VIC AB 2-0 SH 18 (SUTURE) ×1 IMPLANT
SUT VIC AB 3-0 SH 27 (SUTURE) ×1
SUT VIC AB 3-0 SH 27XBRD (SUTURE) ×1 IMPLANT
SUT VICRYL AB 2 0 TIES (SUTURE) ×1 IMPLANT
SYR CONTROL 10ML LL (SYRINGE) ×1 IMPLANT
TOWEL GREEN STERILE (TOWEL DISPOSABLE) ×1 IMPLANT
TOWEL GREEN STERILE FF (TOWEL DISPOSABLE) ×1 IMPLANT

## 2022-02-14 NOTE — Anesthesia Procedure Notes (Signed)
Anesthesia Regional Block: TAP block   Pre-Anesthetic Checklist: , timeout performed,  Correct Patient, Correct Site, Correct Laterality,  Correct Procedure, Correct Position, site marked,  Risks and benefits discussed,  Surgical consent,  Pre-op evaluation,  At surgeon's request and post-op pain management  Laterality: Right  Prep: chloraprep       Needles:  Injection technique: Single-shot  Needle Type: Echogenic Stimulator Needle     Needle Length: 10cm  Needle Gauge: 20     Additional Needles:   Procedures:,,,, ultrasound used (permanent image in chart),,    Narrative:  Start time: 02/14/2022 7:15 AM End time: 02/14/2022 7:18 AM Injection made incrementally with aspirations every 5 mL.  Performed by: Personally  Anesthesiologist: Lidia Collum, MD  Additional Notes: Standard monitors applied. Skin prepped. Good needle visualization with ultrasound. Injection made in 5cc increments with no resistance to injection. Patient tolerated the procedure well.

## 2022-02-14 NOTE — Discharge Instructions (Signed)
[facility]  UMBILICAL OR INGUINAL HERNIA REPAIR: POST OP INSTRUCTIONS  Always review your discharge instruction sheet given to you by the facility where your surgery was performed. IF YOU HAVE DISABILITY OR FAMILY LEAVE FORMS, YOU MUST BRING THEM TO THE OFFICE FOR PROCESSING.   DO NOT GIVE THEM TO YOUR DOCTOR.  A  prescription for pain medication may be given to you upon discharge.  Take your pain medication as prescribed, if needed.  If narcotic pain medicine is not needed, then you may take acetaminophen (Tylenol), naprosyn (Alleve) or ibuprofen (Advil) as needed. Take your usually prescribed medications unless otherwise directed. If you need a refill on your pain medication, please contact your pharmacy.  They will contact our office to request authorization. Prescriptions will not be filled after 5 pm or on week-ends. You should follow a light diet the first 24 hours after arrival home, such as soup and crackers, etc.  Be sure to include lots of fluids daily.  Resume your normal diet the day after surgery. Most patients will experience some swelling and bruising around the umbilicus or in the groin and scrotum.  Ice packs and reclining will help.  Swelling and bruising can take several days to resolve.  It is common to experience some constipation if taking pain medication after surgery.  Increasing fluid intake and taking a stool softener (such as Colace) will usually help or prevent this problem from occurring.  A mild laxative (Milk of Magnesia or Miralax) should be taken according to package directions if there are no bowel movements after 48 hours. Unless discharge instructions indicate otherwise, you may remove your bandages 48 hours after surgery, and you may shower at that time.  You may have steri-strips (small skin tapes) in place directly over the incision.  These strips should be left on the skin for 7-10 days and will come off on their own.  If your surgeon used  skin glue on the incision, you may shower in 24 hours.  The glue will flake off over the next 2-3 weeks.  Any sutures or staples will be removed at the office during your follow-up visit. ACTIVITIES:  You may resume regular (light) daily activities beginning the next day--such as daily self-care, walking, climbing stairs--gradually increasing activities as tolerated.  You may have sexual intercourse when it is comfortable.  Refrain from any heavy lifting or straining until approved by your doctor. You may drive when you are no longer taking prescription pain medication, you can comfortably wear a seatbelt, and you can safely maneuver your car and apply brakes. RETURN TO WORK:  __________________________________________________________ Dennis Bast should see your doctor in the office for a follow-up appointment approximately 2-3 weeks after your surgery.  Make sure that you call for this appointment within a day or two after you arrive home to insure a convenient appointment time. OTHER INSTRUCTIONS:  __________________________________________________________________________________________________________________________________________________________________________________________  WHEN TO CALL YOUR DOCTOR: Fever over 101.0 Inability to urinate Nausea and/or vomiting Extreme swelling or bruising Continued bleeding from incision. Increased pain, redness, or drainage from the incision  The clinic staff is available to answer your questions during regular business hours.  Please don't hesitate to call and ask to speak to one of the nurses for clinical concerns.  If you have a medical emergency, go to the nearest emergency room or call 911.  A surgeon from [facility] is always on call at the hospital   [address] [place], [place], [place]  [address] ?  [address] [address], [place], [place]   [address] [phone] ? [phone] ? FAX [phone] Web site:  [email or web address]  

## 2022-02-14 NOTE — Op Note (Signed)
Preoperative diagnosis: Right inguinal hernia Postoperative diagnosis indirect right inguinal hernia Procedure: Right inguinal hernia repair with ultra Pro mesh Surgeon: Dr. Serita Grammes Anesthesia: General with a tap block Estimated blood loss: Minimal Complications: None Drains: None Specimens: None Sponge needle count was correct at completion Disposition to recovery stable condition  Indications: This is an 88 year old male who I did a left inguinal hernia repair in September 2020.  He has done well from that.  He had a right inguinal hernia noted in 2022 that has become larger and is bothering him now.  We discussed repair.  Procedure:After informed consent was obtained patient was taken to the operating room. He had undergone a TAP block.   He was placed under general anesthesia without complication. He was prepped and draped in standard sterile surgical fashion.   I infiltrated marcaine in the right groin.  I made an incision. I ligated the superficial epigastric vein. I then entered the external oblique through the external ring.  I encircled the spermatic cord with a penrose drain. He had a chronic indirect hernia. There was no direct hernia but the floor was weak. I excised a small cord lipoma and then reduced the hernia in its entirety.  I then closed the internal ring with 2-0 vicryl suture.   I then fashioned an Ultrapro patch to fit the area. I sutured this to the pubic tubercle with 2-0 vicryl and then to the shelving edge inferiorly.  I made a cut and wrapped around spermatic cord. I then sutured the cut ends together and secured the top portion of the mesh. I laid the lateral ends under the external oblique. The mesh was in good position and completely obliterated the defect.  Hemostasis was observed. I closed the external oblique with 2-0 vicryl and Scarpas fascia with 3-0 vicryl. The skin was closed with 4-0 monocryl and dermabond. He tolerated this well, was extubated and  transferred to recovery stable.

## 2022-02-14 NOTE — H&P (Signed)
  88 year old male who I know well from a left inguinal hernia repair in September 2020. Last time I saw him was in January 2022. He had some right groin pain. When I examined him I felt a very small reducible right inguinal hernia that was nontender. I discussed repair at that point in time and he elected to just follow this which I think was completely reasonable. Now he has had more issues with it. He has no changes in bowel movements or urination. This area has popped out a couple of times and he had to work to get it put back in. 1 required at least several hours of some discomfort as well as some ice. He is here to discuss his options now.  Review of Systems: A complete review of systems was obtained from the patient. I have reviewed this information and discussed as appropriate with the patient. See HPI as well for other ROS.  Review of Systems  Endo/Heme/Allergies: Bruises/bleeds easily.  All other systems reviewed and are negative.   Medical History: History reviewed. No pertinent past medical history.  Patient Active Problem List  Diagnosis  Atherosclerosis of aorta (CMS-HCC)  Atherosclerotic peripheral vascular disease (CMS-HCC)  Rheumatoid arthritis involving multiple sites with positive rheumatoid factor (CMS-HCC)   Past Surgical History:  Procedure Laterality Date  HERNIA REPAIR Left  inguinal   Allergies  Allergen Reactions  Other Rash   Current Outpatient Medications on File Prior to Visit  Medication Sig Dispense Refill  folic acid (FOLVITE) 1 MG tablet 1 tablet  lisinopriL (ZESTRIL) 20 MG tablet 1 tablet  amLODIPine (NORVASC) 5 MG tablet take one (1) tablet by mouth each day   No current facility-administered medications on file prior to visit.   History reviewed. No pertinent family history.   Social History   Tobacco Use  Smoking Status Former  Types: Cigarettes  Smokeless Tobacco Never  Marital status: Married  Tobacco Use  Smoking status: Former   Types: Cigarettes  Smokeless tobacco: Never  Substance and Sexual Activity  Alcohol use: Yes  Comment: wine small amts  Drug use: Never   Objective:   Body mass index is 24.53 kg/m.  Physical Exam Vitals reviewed.  Constitutional:  Appearance: Normal appearance.  Abdominal:  Comments: Left groin incision healed, no hernia Larger right inguinal hernia reducible nontender Neurological:  Mental Status: He is alert.   Assessment and Plan:   Right inguinal hernia repair  Would prefer that he hold off on his rheumatoid arthritis medication until least a couple weeks after surgery. He sees Dr. Abner Greenspan tomorrow. I did give him my email so we could contact me if needed.  We discussed open repair of his IH I described the procedure in detail. Goals should be achieved with surgery. We discussed the usage of mesh and the rationale behind that. We went over the pathophysiology of inguinal hernia. We have elected to perform open inguinal hernia repair with mesh. We discussed the risks including bleeding, infection, recurrence, postoperative pain and chronic groin pain, testicular injury, urinary retention, numbness in groin and around incision.

## 2022-02-14 NOTE — Transfer of Care (Signed)
Immediate Anesthesia Transfer of Care Note  Patient: Tanner Rice  Procedure(s) Performed: RIGHT INGUINAL HERNIA REPAIR (Right: Groin) INSERTION OF MESH (Right: Groin)  Patient Location: PACU  Anesthesia Type:General  Level of Consciousness: drowsy  Airway & Oxygen Therapy: Patient Spontanous Breathing and Patient connected to nasal cannula oxygen  Post-op Assessment: Report given to RN and Post -op Vital signs reviewed and stable  Post vital signs: Reviewed and stable  Last Vitals:  Vitals Value Taken Time  BP 128/65 02/14/22 0842  Temp    Pulse 50 02/14/22 0844  Resp 10 02/14/22 0844  SpO2 96 % 02/14/22 0844  Vitals shown include unvalidated device data.  Last Pain:  Vitals:   02/14/22 0629  TempSrc:   PainSc: 0-No pain         Complications: No notable events documented.

## 2022-02-14 NOTE — Anesthesia Procedure Notes (Signed)
Procedure Name: LMA Insertion Date/Time: 02/14/2022 7:41 AM  Performed by: Lieutenant Diego, CRNAPre-anesthesia Checklist: Patient identified, Emergency Drugs available, Suction available and Patient being monitored Patient Re-evaluated:Patient Re-evaluated prior to induction Oxygen Delivery Method: Circle system utilized Preoxygenation: Pre-oxygenation with 100% oxygen Induction Type: IV induction Ventilation: Mask ventilation without difficulty LMA: LMA inserted LMA Size: 4.0 Number of attempts: 1 Placement Confirmation: positive ETCO2 and breath sounds checked- equal and bilateral Tube secured with: Tape Dental Injury: Teeth and Oropharynx as per pre-operative assessment

## 2022-02-14 NOTE — Interval H&P Note (Signed)
History and Physical Interval Note:  02/14/2022 7:14 AM  Tanner Rice  has presented today for surgery, with the diagnosis of RIGHT INGUINAL HERNIA.  The various methods of treatment have been discussed with the patient and family. After consideration of risks, benefits and other options for treatment, the patient has consented to  Procedure(s): OPEN RIGHT INGUINAL HERNIA REPAIR WITH MESH (Right) as a surgical intervention.  The patient's history has been reviewed, patient examined, no change in status, stable for surgery.  I have reviewed the patient's chart and labs.  Questions were answered to the patient's satisfaction.     Rolm Bookbinder

## 2022-02-14 NOTE — Anesthesia Postprocedure Evaluation (Signed)
Anesthesia Post Note  Patient: Tanner Rice  Procedure(s) Performed: RIGHT INGUINAL HERNIA REPAIR (Right: Groin) INSERTION OF MESH (Right: Groin)     Patient location during evaluation: PACU Anesthesia Type: General Level of consciousness: awake and alert Pain management: pain level controlled Vital Signs Assessment: post-procedure vital signs reviewed and stable Respiratory status: spontaneous breathing, nonlabored ventilation and respiratory function stable Cardiovascular status: blood pressure returned to baseline and stable Postop Assessment: no apparent nausea or vomiting Anesthetic complications: no   No notable events documented.  Last Vitals:  Vitals:   02/14/22 0915 02/14/22 0930  BP: (!) 148/76 (!) 161/70  Pulse: (!) 55 (!) 50  Resp: 14 12  Temp:  (!) 36.1 C  SpO2: 98% 96%    Last Pain:  Vitals:   02/14/22 0930  TempSrc:   PainSc: 4                  Lidia Collum

## 2022-02-15 ENCOUNTER — Encounter (HOSPITAL_COMMUNITY): Payer: Self-pay | Admitting: General Surgery

## 2022-03-02 ENCOUNTER — Encounter (HOSPITAL_COMMUNITY): Payer: Self-pay

## 2022-03-02 ENCOUNTER — Inpatient Hospital Stay (HOSPITAL_COMMUNITY)
Admission: EM | Admit: 2022-03-02 | Discharge: 2022-03-06 | DRG: 547 | Disposition: A | Discharge status: Home / Self Care | Payer: Medicare Other | Attending: Internal Medicine | Admitting: Internal Medicine

## 2022-03-02 ENCOUNTER — Other Ambulatory Visit: Payer: Self-pay

## 2022-03-02 ENCOUNTER — Observation Stay (HOSPITAL_COMMUNITY): Payer: Medicare Other

## 2022-03-02 DIAGNOSIS — Z87891 Personal history of nicotine dependence: Secondary | ICD-10-CM | POA: Diagnosis not present

## 2022-03-02 DIAGNOSIS — M2550 Pain in unspecified joint: Secondary | ICD-10-CM

## 2022-03-02 DIAGNOSIS — Z79899 Other long term (current) drug therapy: Secondary | ICD-10-CM | POA: Diagnosis not present

## 2022-03-02 DIAGNOSIS — M7121 Synovial cyst of popliteal space [Baker], right knee: Secondary | ICD-10-CM | POA: Diagnosis not present

## 2022-03-02 DIAGNOSIS — M0579 Rheumatoid arthritis with rheumatoid factor of multiple sites without organ or systems involvement: Secondary | ICD-10-CM | POA: Diagnosis present

## 2022-03-02 DIAGNOSIS — I10 Essential (primary) hypertension: Secondary | ICD-10-CM | POA: Diagnosis present

## 2022-03-02 DIAGNOSIS — M00861 Arthritis due to other bacteria, right knee: Secondary | ICD-10-CM | POA: Diagnosis present

## 2022-03-02 DIAGNOSIS — M19012 Primary osteoarthritis, left shoulder: Secondary | ICD-10-CM | POA: Diagnosis not present

## 2022-03-02 DIAGNOSIS — Z792 Long term (current) use of antibiotics: Secondary | ICD-10-CM | POA: Diagnosis not present

## 2022-03-02 DIAGNOSIS — Z8042 Family history of malignant neoplasm of prostate: Secondary | ICD-10-CM | POA: Diagnosis not present

## 2022-03-02 DIAGNOSIS — Z8614 Personal history of Methicillin resistant Staphylococcus aureus infection: Secondary | ICD-10-CM | POA: Diagnosis not present

## 2022-03-02 DIAGNOSIS — M25461 Effusion, right knee: Secondary | ICD-10-CM | POA: Diagnosis not present

## 2022-03-02 DIAGNOSIS — M1711 Unilateral primary osteoarthritis, right knee: Secondary | ICD-10-CM | POA: Diagnosis present

## 2022-03-02 DIAGNOSIS — M25512 Pain in left shoulder: Secondary | ICD-10-CM | POA: Diagnosis present

## 2022-03-02 DIAGNOSIS — M25431 Effusion, right wrist: Secondary | ICD-10-CM | POA: Diagnosis not present

## 2022-03-02 DIAGNOSIS — M4644 Discitis, unspecified, thoracic region: Secondary | ICD-10-CM | POA: Diagnosis present

## 2022-03-02 DIAGNOSIS — Z91013 Allergy to seafood: Secondary | ICD-10-CM

## 2022-03-02 DIAGNOSIS — M25561 Pain in right knee: Secondary | ICD-10-CM | POA: Diagnosis not present

## 2022-03-02 DIAGNOSIS — Z85820 Personal history of malignant melanoma of skin: Secondary | ICD-10-CM | POA: Diagnosis not present

## 2022-03-02 DIAGNOSIS — N400 Enlarged prostate without lower urinary tract symptoms: Secondary | ICD-10-CM | POA: Diagnosis present

## 2022-03-02 DIAGNOSIS — M65861 Other synovitis and tenosynovitis, right lower leg: Secondary | ICD-10-CM | POA: Diagnosis not present

## 2022-03-02 DIAGNOSIS — Z880 Allergy status to penicillin: Secondary | ICD-10-CM | POA: Diagnosis not present

## 2022-03-02 DIAGNOSIS — R29898 Other symptoms and signs involving the musculoskeletal system: Secondary | ICD-10-CM | POA: Diagnosis not present

## 2022-03-02 DIAGNOSIS — M179 Osteoarthritis of knee, unspecified: Secondary | ICD-10-CM | POA: Diagnosis present

## 2022-03-02 DIAGNOSIS — M25562 Pain in left knee: Secondary | ICD-10-CM | POA: Diagnosis present

## 2022-03-02 LAB — SYNOVIAL CELL COUNT + DIFF, W/ CRYSTALS
Crystals, Fluid: NONE SEEN
Eosinophils-Synovial: 0 % (ref 0–1)
Lymphocytes-Synovial Fld: 4 % (ref 0–20)
Monocyte-Macrophage-Synovial Fluid: 7 % — ABNORMAL LOW (ref 50–90)
Neutrophil, Synovial: 89 % — ABNORMAL HIGH (ref 0–25)
WBC, Synovial: 16800 /mm3 — ABNORMAL HIGH (ref 0–200)

## 2022-03-02 LAB — URINALYSIS, MICROSCOPIC (REFLEX)
Bacteria, UA: NONE SEEN
RBC / HPF: NONE SEEN RBC/hpf (ref 0–5)
WBC, UA: NONE SEEN WBC/hpf (ref 0–5)

## 2022-03-02 LAB — URINALYSIS, ROUTINE W REFLEX MICROSCOPIC
Bilirubin Urine: NEGATIVE
Glucose, UA: NEGATIVE mg/dL
Ketones, ur: NEGATIVE mg/dL
Leukocytes,Ua: NEGATIVE
Nitrite: NEGATIVE
Protein, ur: NEGATIVE mg/dL
Specific Gravity, Urine: 1.01 (ref 1.005–1.030)
pH: 6.5 (ref 5.0–8.0)

## 2022-03-02 LAB — COMPREHENSIVE METABOLIC PANEL
ALT: 15 U/L (ref 0–44)
AST: 21 U/L (ref 15–41)
Albumin: 3.5 g/dL (ref 3.5–5.0)
Alkaline Phosphatase: 62 U/L (ref 38–126)
Anion gap: 8 (ref 5–15)
BUN: 17 mg/dL (ref 8–23)
CO2: 23 mmol/L (ref 22–32)
Calcium: 9.8 mg/dL (ref 8.9–10.3)
Chloride: 102 mmol/L (ref 98–111)
Creatinine, Ser: 0.88 mg/dL (ref 0.61–1.24)
GFR, Estimated: >60 mL/min (ref >60)
Glucose, Bld: 103 mg/dL — ABNORMAL HIGH (ref 70–99)
Potassium: 3.8 mmol/L (ref 3.5–5.1)
Sodium: 133 mmol/L — ABNORMAL LOW (ref 135–145)
Total Bilirubin: 1.1 mg/dL (ref 0.3–1.2)
Total Protein: 6.3 g/dL — ABNORMAL LOW (ref 6.5–8.1)

## 2022-03-02 LAB — CBC WITH DIFFERENTIAL/PLATELET
Abs Immature Granulocytes: 0.07 10*3/uL (ref 0.00–0.07)
Basophils Absolute: 0 10*3/uL (ref 0.0–0.1)
Basophils Relative: 0 %
Eosinophils Absolute: 0 10*3/uL (ref 0.0–0.5)
Eosinophils Relative: 0 %
HCT: 36.8 % — ABNORMAL LOW (ref 39.0–52.0)
Hemoglobin: 12.9 g/dL — ABNORMAL LOW (ref 13.0–17.0)
Immature Granulocytes: 0 %
Lymphocytes Relative: 9 %
Lymphs Abs: 1.4 10*3/uL (ref 0.7–4.0)
MCH: 33.7 pg (ref 26.0–34.0)
MCHC: 35.1 g/dL (ref 30.0–36.0)
MCV: 96.1 fL (ref 80.0–100.0)
Monocytes Absolute: 1.8 10*3/uL — ABNORMAL HIGH (ref 0.1–1.0)
Monocytes Relative: 11 %
Neutro Abs: 12.8 10*3/uL — ABNORMAL HIGH (ref 1.7–7.7)
Neutrophils Relative %: 80 %
Platelets: 183 10*3/uL (ref 150–400)
RBC: 3.83 MIL/uL — ABNORMAL LOW (ref 4.22–5.81)
RDW: 12.1 % (ref 11.5–15.5)
WBC: 16.1 10*3/uL — ABNORMAL HIGH (ref 4.0–10.5)
nRBC: 0 % (ref 0.0–0.2)

## 2022-03-02 LAB — MAGNESIUM: Magnesium: 1.9 mg/dL (ref 1.7–2.4)

## 2022-03-02 MED ORDER — SENNOSIDES-DOCUSATE SODIUM 8.6-50 MG PO TABS: 1.0000 | ORAL_TABLET | Freq: Every evening | ORAL | Status: DC | PRN

## 2022-03-02 MED ORDER — ENOXAPARIN SODIUM 40 MG/0.4ML IJ SOSY
40.0000 mg | PREFILLED_SYRINGE | INTRAMUSCULAR | Status: DC
  Administered 2022-03-02 – 2022-03-05 (×4): 40 mg via SUBCUTANEOUS
  Filled 2022-03-02 (×4): qty 0.4

## 2022-03-02 MED ORDER — METHYLPREDNISOLONE ACETATE 40 MG/ML IJ SUSP
40.0000 mg | Freq: Once | INTRAMUSCULAR | Status: AC
  Administered 2022-03-02: 40 mg via INTRA_ARTICULAR
  Filled 2022-03-02: qty 1

## 2022-03-02 MED ORDER — BUPIVACAINE HCL 0.25 % IJ SOLN
5.0000 mL | Freq: Once | INTRAMUSCULAR | Status: AC
  Administered 2022-03-02: 5 mL
  Filled 2022-03-02: qty 5

## 2022-03-02 MED ORDER — LISINOPRIL 20 MG PO TABS
20.0000 mg | ORAL_TABLET | Freq: Every day | ORAL | Status: DC
Start: 2022-03-02 — End: 2022-03-02

## 2022-03-02 MED ORDER — DICLOFENAC SODIUM 1 % EX GEL
1.0000 | Freq: Two times a day (BID) | CUTANEOUS | Status: DC | PRN
  Filled 2022-03-02: qty 100

## 2022-03-02 MED ORDER — SODIUM CHLORIDE 0.9 % IV BOLUS: 1000.0000 mL | Freq: Once | INTRAVENOUS | Status: DC

## 2022-03-02 MED ORDER — LISINOPRIL 20 MG PO TABS
20.0000 mg | ORAL_TABLET | Freq: Every day | ORAL | Status: DC
  Administered 2022-03-03 – 2022-03-06 (×4): 20 mg via ORAL
  Filled 2022-03-02 (×4): qty 1

## 2022-03-02 MED ORDER — SENNOSIDES-DOCUSATE SODIUM 8.6-50 MG PO TABS: 1.0000 | ORAL_TABLET | Freq: Every day | ORAL | Status: DC | PRN

## 2022-03-02 MED ORDER — LIDOCAINE HCL 1 % IJ SOLN
10.0000 mL | Freq: Once | INTRAMUSCULAR | Status: AC
  Administered 2022-03-02: 10 mL
  Filled 2022-03-02: qty 10

## 2022-03-02 MED ORDER — AMLODIPINE BESYLATE 5 MG PO TABS
5.0000 mg | ORAL_TABLET | Freq: Every day | ORAL | Status: DC
  Administered 2022-03-03 – 2022-03-06 (×4): 5 mg via ORAL
  Filled 2022-03-02 (×4): qty 1

## 2022-03-02 MED ORDER — ONDANSETRON HCL 4 MG PO TABS
4.0000 mg | ORAL_TABLET | Freq: Four times a day (QID) | ORAL | Status: DC | PRN
  Administered 2022-03-02: 4 mg via ORAL
  Filled 2022-03-02: qty 1

## 2022-03-02 MED ORDER — ENOXAPARIN SODIUM 40 MG/0.4ML IJ SOSY: 40.0000 mg | PREFILLED_SYRINGE | INTRAMUSCULAR | Status: DC

## 2022-03-02 MED ORDER — ONDANSETRON HCL 4 MG/2ML IJ SOLN: 4.0000 mg | Freq: Four times a day (QID) | INTRAMUSCULAR | Status: DC | PRN

## 2022-03-02 MED ORDER — AMLODIPINE BESYLATE 5 MG PO TABS: 5.0000 mg | ORAL_TABLET | Freq: Every day | ORAL | Status: DC

## 2022-03-02 MED ORDER — ACETAMINOPHEN ER 650 MG PO TBCR: 650.0000 mg | EXTENDED_RELEASE_TABLET | Freq: Three times a day (TID) | ORAL | Status: DC | PRN

## 2022-03-02 MED ORDER — BISACODYL 5 MG PO TBEC
5.0000 mg | DELAYED_RELEASE_TABLET | Freq: Every day | ORAL | Status: DC | PRN
  Administered 2022-03-03: 5 mg via ORAL
  Filled 2022-03-02: qty 1

## 2022-03-02 MED ORDER — HYDROCODONE-ACETAMINOPHEN 5-325 MG PO TABS: 1.0000 | ORAL_TABLET | Freq: Once | ORAL | Status: DC

## 2022-03-02 MED ORDER — MELATONIN 5 MG PO TABS
5.0000 mg | ORAL_TABLET | Freq: Every day | ORAL | Status: DC
  Administered 2022-03-02 – 2022-03-05 (×4): 5 mg via ORAL
  Filled 2022-03-02 (×5): qty 1

## 2022-03-02 MED ORDER — METOPROLOL SUCCINATE ER 25 MG PO TB24
25.0000 mg | ORAL_TABLET | Freq: Every evening | ORAL | Status: DC
  Administered 2022-03-03 – 2022-03-05 (×3): 25 mg via ORAL
  Filled 2022-03-02 (×3): qty 1

## 2022-03-02 MED ORDER — ACETAMINOPHEN 325 MG PO TABS: 650.0000 mg | ORAL_TABLET | Freq: Four times a day (QID) | ORAL | Status: DC | PRN

## 2022-03-02 MED ORDER — OYSTER SHELL CALCIUM/D3 500-5 MG-MCG PO TABS
ORAL_TABLET | Freq: Every day | ORAL | Status: DC
  Administered 2022-03-03 – 2022-03-06 (×4): 1 via ORAL
  Filled 2022-03-02 (×4): qty 1

## 2022-03-02 MED ORDER — HYDROMORPHONE HCL 1 MG/ML IJ SOLN
0.5000 mg | INTRAMUSCULAR | Status: DC | PRN
  Administered 2022-03-03 – 2022-03-05 (×2): 0.5 mg via INTRAVENOUS
  Filled 2022-03-02 (×2): qty 0.5

## 2022-03-02 MED ORDER — TRAMADOL HCL 50 MG PO TABS
50.0000 mg | ORAL_TABLET | Freq: Four times a day (QID) | ORAL | Status: DC | PRN
  Administered 2022-03-02 – 2022-03-05 (×5): 50 mg via ORAL
  Filled 2022-03-02 (×5): qty 1

## 2022-03-02 NOTE — H&P (Addendum)
History and Physical    Tanner Rice:[id number] DOB: [birth year] DOA: 03/02/2022  PCP: Lajean Manes, MD (Confirm with patient/family/NH records and if not entered, this has to be entered at [facility] point of entry) Patient coming from: Home  I have personally briefly reviewed patient's old medical records in [place]  Chief Complaint: Right knee pain  HPI: Tanner Rice is a 88 y.o. male with medical history significant of rheumatoid arthritis on DMARDs, HTN, BPH, remote history of Baker's cyst, presented with sudden onset of right knee pain and ambulation dysfunction.  Patient has chronic rheumatoid arthritis, severe, on DM ARDS occluding infusion of Simponi every 6 weeks and weekly methotrexate, recently patient underwent a right groin hernia repair 2 weeks ago and scheduled dose of Simponi was canceled.  Patient still taking weekly methotrexate.  This morning, patient woke up with severe right knee pain, he also noticed sudden onset of swelling of right knee and fullness right popliteal fossa, no fever or chills, he feels some increasing pain of small joints such as bilateral elbows, bilateral wrist and fingers but not as severe as right knee.  Right groin surgical wound was followed by surgery and seen 2 days ago and considered to be well-healed and no signs of infection. ED Course: Vital signs stable afebrile, on physical exam right knee significant , tender to touch and significant reduced ROM but no warmness.  Previously 16.  Review of Systems: As per HPI otherwise 14 point review of systems negative.    Past Medical History:  Diagnosis Date   Hypertension    Melanoma ([place])    upper thigh on right leg   Rheumatoid arthritis(714.0)    Thoracic discitis     Past Surgical History:  Procedure Laterality Date   CATARACT EXTRACTION, BILATERAL     COLONOSCOPY     hole in retina surgery     INGUINAL HERNIA REPAIR Left 02/09/2019   Procedure: LEFT INGUINAL HERNIA REPAIR WITH  MESH;  Surgeon: Rolm Bookbinder, MD;  Location: [place];  Service: General;  Laterality: Left;  GENERAL AND TAP BLOCK   INGUINAL HERNIA REPAIR Right 02/14/2022   Procedure: RIGHT INGUINAL HERNIA REPAIR;  Surgeon: Rolm Bookbinder, MD;  Location: [place];  Service: General;  Laterality: Right;   INSERTION OF MESH Right 02/14/2022   Procedure: INSERTION OF MESH;  Surgeon: Rolm Bookbinder, MD;  Location: [facility];  Service: General;  Laterality: Right;   IR GENERIC HISTORICAL  04/24/2016   IR US GUIDE VASC ACCESS RIGHT 04/24/2016 Arne Cleveland, MD [facility]   IR GENERIC HISTORICAL  04/24/2016   IR FLUORO GUIDE CV LINE RIGHT 04/24/2016 Arne Cleveland, MD [facility]   TEE WITHOUT CARDIOVERSION N/A 02/02/2016   Procedure: TRANSESOPHAGEAL ECHOCARDIOGRAM (TEE);  Surgeon: Jerline Pain, MD;  Location: [facility] ENDOSCOPY;  Service: Cardiovascular;  Laterality: N/A;   TONSILLECTOMY       reports that he has quit smoking. His smoking use included pipe, cigars, and cigarettes. He has never used smokeless tobacco. He reports current alcohol use. He reports that he does not use drugs.  Allergies  Allergen Reactions   Other Diarrhea, Nausea And Vomiting and Other (See Comments)    Seafood - mussels    Penicillins Cross Reactors Hives    Did it involve swelling of the face/tongue/throat, SOB, or low BP? Unknown Did it involve sudden or severe rash/hives, skin peeling, or any reaction on the inside of your mouth or nose? Unknown Did you need to  seek medical attention at a hospital or doctor's office? Yes When did it last happen? over 65 years ago     If all above answers are "NO", may proceed with cephalosporin use. .    Family History  Problem Relation Age of Onset   Prostate cancer Father    Prostate cancer Brother    Osteoarthritis Brother      Prior to Admission medications   Medication Sig Start Date End Date Taking? Authorizing Provider  acetaminophen (TYLENOL) 650 MG CR tablet Take 650 mg  by mouth every 8 (eight) hours as needed for pain.    [provider]  alfuzosin (UROXATRAL) 10 MG 24 hr tablet Take 10 mg by mouth every evening. 10/20/18   [provider]  amLODipine (NORVASC) 5 MG tablet Take 5 mg by mouth daily. 12/17/21   [provider]  Calcium Carb-Cholecalciferol (CALCIUM 600/VITAMIN D3 PO) Take 1 tablet by mouth daily.    [provider]  cholecalciferol (VITAMIN D) 25 MCG (1000 UT) tablet Take 1,000 Units by mouth in the morning and at bedtime.    [provider]  Cyanocobalamin (VITAMIN B12) 1000 MCG TBCR Take 1,000 mcg by mouth in the morning and at bedtime.    [provider]  diclofenac Sodium (VOLTAREN) 1 % GEL Apply 1 Application topically 2 (two) times daily as needed (pain).    [provider]  doxycycline (VIBRA-TABS) 100 MG tablet Take 1 tablet (100 mg total) by mouth 2 (two) times daily. 08/14/21 08/14/22  Michel Bickers, MD  finasteride (PROSCAR) 5 MG tablet Take 5 mg by mouth daily. 01/14/22   [provider]  folic acid (FOLVITE) 1 MG tablet Take 1 mg by mouth daily. 01/08/20   [provider]  golimumab (SIMPONI ARIA) 50 MG/4ML SOLN injection Inject 50 mg into the vein See admin instructions. Every 6-8 weeks 01/03/20   [provider]  iron polysaccharides (FERREX 150) 150 MG capsule Take 150 mg by mouth daily.    [provider]  lisinopril (ZESTRIL) 20 MG tablet Take 20 mg by mouth daily. 01/07/19   [provider]  Melatonin 5 MG TABS Take 5 mg by mouth at bedtime.    [provider]  methotrexate (RHEUMATREX) 2.5 MG tablet Take 10 mg by mouth See admin instructions. 10 mg twice daily every Thursday 01/31/20   [provider]  metoprolol succinate (TOPROL-XL) 50 MG 24 hr tablet Take 1 tablet (50 mg total) by mouth daily. Take with or immediately following a meal. Patient taking differently: Take 25 mg by mouth every evening. Take with or  immediately following a meal. 02/07/16   Kelvin Cellar, MD  Misc Natural Products (TURMERIC CURCUMIN) CAPS Take 1 capsule by mouth daily.    [provider]  Multiple Vitamin (MULTIVITAMIN WITH MINERALS) TABS tablet Take 1 tablet by mouth daily.    [provider]  sennosides-docusate sodium (SENOKOT-S) 8.6-50 MG tablet Take 1 tablet by mouth daily as needed for constipation.    [provider]  traMADol (ULTRAM) 50 MG tablet Take 1 tablet (50 mg total) by mouth every 6 (six) hours as needed. 02/14/22 02/14/23  Rolm Bookbinder, MD    Physical Exam: Vitals:   03/02/22 1330 03/02/22 1400 03/02/22 1430 03/02/22 1500  BP: 139/66 131/68 (!) 163/85 (!) 150/88  Pulse: 75 63 (!) 57 69  Resp:    18  Temp:      TempSrc:      SpO2: 98% 95%  95% 99%  Weight:      Height:        Constitutional: NAD, calm, comfortable Vitals:   03/02/22 1330 03/02/22 1400 03/02/22 1430 03/02/22 1500  BP: 139/66 131/68 (!) 163/85 (!) 150/88  Pulse: 75 63 (!) 57 69  Resp:    18  Temp:      TempSrc:      SpO2: 98% 95% 95% 99%  Weight:      Height:       Eyes: PERRL, lids and conjunctivae normal ENMT: Mucous membranes are moist. Posterior pharynx clear of any exudate or lesions.Normal dentition.  Neck: normal, supple, no masses, no thyromegaly Respiratory: clear to auscultation bilaterally, no wheezing, no crackles. Normal respiratory effort. No accessory muscle use.  Cardiovascular: Regular rate and rhythm, no murmurs / rubs / gallops. No extremity edema. 2+ pedal pulses. No carotid bruits.  Abdomen: no tenderness, no masses palpated. No hepatosplenomegaly. Bowel sounds positive.  Musculoskeletal: Swelling lower thigh above knee, bilaterally, around the kneecap, not warm to touch, compared to left side, significant reduced ROM due to pain.  Fullness on popliteal fossa on the right side compared to left, with severe tenderness Skin: no rashes, lesions, ulcers. No  induration Neurologic: CN 2-12 grossly intact. Sensation intact, DTR normal. Strength 5/5 in all 4.  Psychiatric: Normal judgment and insight. Alert and oriented x 3. Normal mood.    Labs on Admission: I have personally reviewed following labs and imaging studies  CBC: Recent Labs  Lab 03/02/22 1305  WBC 16.1*  NEUTROABS 12.8*  HGB 12.9*  HCT 36.8*  MCV 96.1  PLT 950   Basic Metabolic Panel: Recent Labs  Lab 03/02/22 1305  NA 133*  K 3.8  CL 102  CO2 23  GLUCOSE 103*  BUN 17  CREATININE 0.88  CALCIUM 9.8  MG 1.9   GFR: Estimated Creatinine Clearance: 54.4 mL/min (by C-G formula based on SCr of 0.88 mg/dL). Liver Function Tests: Recent Labs  Lab 03/02/22 1305  AST 21  ALT 15  ALKPHOS 62  BILITOT 1.1  PROT 6.3*  ALBUMIN 3.5   No results for input(s): "LIPASE", "AMYLASE" in the last 168 hours. No results for input(s): "AMMONIA" in the last 168 hours. Coagulation Profile: No results for input(s): "INR", "PROTIME" in the last 168 hours. Cardiac Enzymes: No results for input(s): "CKTOTAL", "CKMB", "CKMBINDEX", "TROPONINI" in the last 168 hours. BNP (last 3 results) No results for input(s): "PROBNP" in the last 8760 hours. HbA1C: No results for input(s): "HGBA1C" in the last 72 hours. CBG: No results for input(s): "GLUCAP" in the last 168 hours. Lipid Profile: No results for input(s): "CHOL", "HDL", "LDLCALC", "TRIG", "CHOLHDL", "LDLDIRECT" in the last 72 hours. Thyroid Function Tests: No results for input(s): "TSH", "T4TOTAL", "FREET4", "T3FREE", "THYROIDAB" in the last 72 hours. Anemia Panel: No results for input(s): "VITAMINB12", "FOLATE", "FERRITIN", "TIBC", "IRON", "RETICCTPCT" in the last 72 hours. Urine analysis:    Component Value Date/Time   COLORURINE YELLOW 03/02/2022 1306   APPEARANCEUR CLEAR 03/02/2022 1306   LABSPEC 1.010 03/02/2022 1306   PHURINE 6.5 03/02/2022 1306   GLUCOSEU NEGATIVE 03/02/2022 1306   HGBUR TRACE (A) 03/02/2022 1306    BILIRUBINUR NEGATIVE 03/02/2022 1306   KETONESUR NEGATIVE 03/02/2022 1306   PROTEINUR NEGATIVE 03/02/2022 1306   NITRITE NEGATIVE 03/02/2022 1306   LEUKOCYTESUR NEGATIVE 03/02/2022 1306    Radiological Exams on Admission: No results found.  EKG: Independently reviewed.  Sinus, chronic RBBB, first-degree AV block.  Assessment/Plan Principal Problem:  Baker cyst, right  (please populate well all problems here in Problem List. (For example, if patient is on BP meds at home and you resume or decide to hold them, it is a problem that needs to be her. 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  Discussed with on-call orthopedic surgery Dr. Jodi Mourning, knee x-ray ordered and orthopedic surgery plan for steroid injection x1 at bedside today. -Pain control, PT evaluation -Other DDx, no significant swelling of the knee joint itself and right knee joint not warm to touch, makes septic arthritis less likely.  Although patient has elevated WBC count but he has no other systemic infection symptoms, hold off antibiotics. UA negative for UTI  Rheumatoid arthritis -No significant flareup, recommend patient contact rheumatologist to decide when to resume Simponi infusion -Continue weekly methotrexate  HTN, uncontrolled -Resume home BP meds amlodipine and ACEI and metoprolol.  BPH -Stable  DVT prophylaxis: Lovenox Code Status: Full code Family Communication: Wife at bedside Disposition Plan: Expect less than 2 midnight hospital stay Consults called: Orthopedic surgery Admission status: MedSurg observation   Lequita Halt MD [facility] Pager [phone]  03/02/2022, 3:41 PM

## 2022-03-02 NOTE — Consult Note (Signed)
ORTHOPAEDIC CONSULTATION  REQUESTING PHYSICIAN: Lequita Halt, MD  PCP:  Lajean Manes, MD  Chief Complaint: right knee pain  HPI: Tanner Rice is a 88 y.o. male male with medical history significant of rheumatoid arthritis on DMARDs, HTN, BPH, remote history of Baker's cyst, presented with sudden onset of right knee pain and ambulation dysfunction. Patient has chronic rheumatoid arthritis, severe, on DM ARDS occluding infusion of Simponi every 6 weeks and weekly methotrexate, recently patient underwent a right groin hernia repair 2 weeks ago and scheduled dose of Simponi was canceled.  Patient still taking weekly methotrexate. He noted severe right knee pain stating yesterday, he also noticed sudden onset of swelling of right knee. He denies any injury or falls. Denies tingling and numbness in LE bilaterally. Orthopedics was consulted for right knee pain and swelling.   X-rays showed no signs of fracture, dislocation. Mild to moderate tricompartmental arthritis. Right knee effusion noted.       Past Medical History:  Diagnosis Date   Hypertension    Melanoma ([place])    upper thigh on right leg   Rheumatoid arthritis(714.0)    Thoracic discitis    Past Surgical History:  Procedure Laterality Date   CATARACT EXTRACTION, BILATERAL     COLONOSCOPY     hole in retina surgery     INGUINAL HERNIA REPAIR Left 02/09/2019   Procedure: LEFT INGUINAL HERNIA REPAIR WITH MESH;  Surgeon: Rolm Bookbinder, MD;  Location: [facility];  Service: General;  Laterality: Left;  GENERAL AND TAP BLOCK   INGUINAL HERNIA REPAIR Right 02/14/2022   Procedure: RIGHT INGUINAL HERNIA REPAIR;  Surgeon: Rolm Bookbinder, MD;  Location: [facility];  Service: General;  Laterality: Right;   INSERTION OF MESH Right 02/14/2022   Procedure: INSERTION OF MESH;  Surgeon: Rolm Bookbinder, MD;  Location: [facility];  Service: General;  Laterality: Right;   IR GENERIC HISTORICAL  04/24/2016   IR US GUIDE VASC ACCESS RIGHT 04/24/2016  Arne Cleveland, MD [facility]   IR GENERIC HISTORICAL  04/24/2016   IR FLUORO GUIDE CV LINE RIGHT 04/24/2016 Arne Cleveland, MD [facility]   TEE WITHOUT CARDIOVERSION N/A 02/02/2016   Procedure: TRANSESOPHAGEAL ECHOCARDIOGRAM (TEE);  Surgeon: Jerline Pain, MD;  Location: [facility] ENDOSCOPY;  Service: Cardiovascular;  Laterality: N/A;   TONSILLECTOMY     Social History   Socioeconomic History   Marital status: Married    Spouse name: Not on file   Number of children: Not on file   Years of education: Not on file   Highest education level: Not on file  Occupational History   Not on file  Tobacco Use   Smoking status: Former    Types: Pipe, Cigars, Cigarettes   Smokeless tobacco: Never   Tobacco comments:    quit 42 yrs. ago  Vaping Use   Vaping Use: Never used  Substance and Sexual Activity   Alcohol use: Yes    Comment: 4x week glass wine   Drug use: No   Sexual activity: Not on file  Other Topics Concern   Not on file  Social History Narrative   Not on file   Social Determinants of Health   Financial Resource Strain: Not on file  Food Insecurity: Not on file  Transportation Needs: Not on file  Physical Activity: Not on file  Stress: Not on file  Social Connections: Not on file   Family History  Problem Relation Age of Onset   Prostate cancer Father  Prostate cancer Brother    Osteoarthritis Brother    Allergies  Allergen Reactions   Other Diarrhea, Nausea And Vomiting and Other (See Comments)    Seafood - mussels    Penicillins Cross Reactors Hives    Did it involve swelling of the face/tongue/throat, SOB, or low BP? Unknown Did it involve sudden or severe rash/hives, skin peeling, or any reaction on the inside of your mouth or nose? Unknown Did you need to seek medical attention at a hospital or doctor's office? Yes When did it last happen? over 65 years ago     If all above answers are "NO", may proceed with cephalosporin use. .   Prior to Admission  medications   Medication Sig Start Date End Date Taking? Authorizing Provider  acetaminophen (TYLENOL) 650 MG CR tablet Take 650 mg by mouth every 8 (eight) hours as needed for pain.    [provider]  alfuzosin (UROXATRAL) 10 MG 24 hr tablet Take 10 mg by mouth every evening. 10/20/18   [provider]  amLODipine (NORVASC) 5 MG tablet Take 5 mg by mouth daily. 12/17/21   [provider]  Calcium Carb-Cholecalciferol (CALCIUM 600/VITAMIN D3 PO) Take 1 tablet by mouth daily.    [provider]  cholecalciferol (VITAMIN D) 25 MCG (1000 UT) tablet Take 1,000 Units by mouth in the morning and at bedtime.    [provider]  Cyanocobalamin (VITAMIN B12) 1000 MCG TBCR Take 1,000 mcg by mouth in the morning and at bedtime.    [provider]  diclofenac Sodium (VOLTAREN) 1 % GEL Apply 1 Application topically 2 (two) times daily as needed (pain).    [provider]  doxycycline (VIBRA-TABS) 100 MG tablet Take 1 tablet (100 mg total) by mouth 2 (two) times daily. 08/14/21 08/14/22  Michel Bickers, MD  finasteride (PROSCAR) 5 MG tablet Take 5 mg by mouth daily. 01/14/22   [provider]  folic acid (FOLVITE) 1 MG tablet Take 1 mg by mouth daily. 01/08/20   [provider]  golimumab (SIMPONI ARIA) 50 MG/4ML SOLN injection Inject 50 mg into the vein See admin instructions. Every 6-8 weeks 01/03/20   [provider]  iron polysaccharides (FERREX 150) 150 MG capsule Take 150 mg by mouth daily.    [provider]  lisinopril (ZESTRIL) 20 MG tablet Take 20 mg by mouth daily. 01/07/19   [provider]  Melatonin 5 MG TABS Take 5 mg by mouth at bedtime.    [provider]  methotrexate (RHEUMATREX) 2.5 MG tablet Take 10 mg by mouth See admin instructions. 10 mg twice daily every Thursday 01/31/20   [provider]  metoprolol succinate (TOPROL-XL) 50 MG 24 hr tablet Take 1 tablet (50 mg total) by  mouth daily. Take with or immediately following a meal. Patient taking differently: Take 25 mg by mouth every evening. Take with or immediately following a meal. 02/07/16   Kelvin Cellar, MD  Misc Natural Products (TURMERIC CURCUMIN) CAPS Take 1 capsule by mouth daily.    [provider]  Multiple Vitamin (MULTIVITAMIN WITH MINERALS) TABS tablet Take 1 tablet by mouth daily.    [provider]  sennosides-docusate sodium (SENOKOT-S) 8.6-50 MG tablet Take 1 tablet by mouth daily as needed for constipation.    [provider]  traMADol (ULTRAM) 50 MG tablet Take 1 tablet (50 mg total) by mouth every 6 (six) hours as needed. 02/14/22 02/14/23  Rolm Bookbinder, MD   DG Knee  Right Port  Result Date: 03/02/2022 CLINICAL DATA:  Generalized pain, no reported injury EXAM: PORTABLE RIGHT KNEE - 1-2 VIEW COMPARISON:  None Available. FINDINGS: No fracture or dislocation. Apparent moderate suprapatellar right knee joint effusion on the limited slightly obliqued cross-table lateral view. Mild tricompartmental right knee osteoarthritis with meniscal chondrocalcinosis. No suspicious focal osseous lesions. No radiopaque foreign bodies. IMPRESSION: Apparent moderate suprapatellar right knee joint effusion. No fracture or malalignment. Mild tricompartmental right knee osteoarthritis with meniscal chondrocalcinosis, suggesting CPPD arthropathy. Electronically Signed   By: Ilona Sorrel M.D.   On: 03/02/2022 15:47    Positive ROS: All other systems have been reviewed and were otherwise negative with the exception of those mentioned in the HPI and as above.  Physical Exam: General: Alert, no acute distress Cardiovascular: No pedal edema Respiratory: No cyanosis, no use of accessory musculature GI: No organomegaly, abdomen is soft and non-tender Skin: No lesions in the area of chief complaint. Right groin surgical wound C/D/I. Neurologic: Sensation intact distally Psychiatric: Patient is  competent for consent with normal mood and affect Lymphatic: No axillary or cervical lymphadenopathy  MUSCULOSKELETAL: Examination of the right knee reveals no skin wounds, lesions, rashes or erythema. No warmth noted. Mild swelling with effusion noted. Significant pain with palpation diffusely in the right knee. Reduced ROM due to pain.   Sensory and motor function intact in LE bilaterally including plantar flexion, dorsiflexion, and EHL. Distal pedal pulses 2+ bilaterally. Capillary refill < 2 seconds. No significant pedal edema. Calves soft and non-tender.   Assessment: Right knee effusion History of RA  Plan: I discussed the findings with the patient and his wife. He has a notable right knee effusion seen on exam and x-ray. I recommend a right knee aspiration. Patient wishes to proceed. Knee was sterilely prepped and 5cc of lidocaine was injected into the subcutaneous tissue of the right knee. Right Knee was cleansed again and 18 gauge needle was inserted into the suprapatellar pouch with 70cc of straw-colored cloudy fluid aspirated. Sterile dressing placed and ace bandage applied for gentle compression. Synovial fluid placed into sterile container and sent for culture with cell count gram stain, aerobic and anaerobic, and crystals. Patient noted immediate relief with aspiration. Notable reduction in swelling post aspiration. Will follow cultures. Ice to right knee as needed for pain and swelling.    Charlott Rakes, PA-C    03/02/2022 5:18 PM

## 2022-03-02 NOTE — ED Notes (Signed)
Help patient use the urinal patient is resting with family at bedside and call bell in reach

## 2022-03-02 NOTE — ED Triage Notes (Signed)
Per EMS from home decreased ADL's and unable to walk. Pt has not been taking his medications. General walk. Lives with wife. AxOx4.  Pt was getting a treatment for RA but has stopped since he had surgery 2 weeks ago.   VS  166/78 62 99 %

## 2022-03-02 NOTE — ED Provider Notes (Signed)
[facility] EMERGENCY DEPARTMENT Provider Note   CSN: [id number] Arrival date & time: 03/02/22  1148     History  No chief complaint on file.  HPI Tanner Rice is a 88 y.o. male with rheumatoid arthritis and recent history of inguinal hernia repair 2 weeks ago presenting for generalized pain.  Pain started 2 days ago and is "all over".  States it feels like the pain he experienced before he was diagnosed with RA.  It is so severe that he is now unable to get out of bed and do his normal ADLs.  Patient stated that before his planned inguinal surgery 2 weeks ago he was told by his general surgery to discontinue his regular infusion, Simponi Aria, in preparation for surgery.    HPI     Home Medications Prior to Admission medications   Medication Sig Start Date End Date Taking? Authorizing Provider  acetaminophen (TYLENOL) 650 MG CR tablet Take 650 mg by mouth every 8 (eight) hours as needed for pain.    [provider]  alfuzosin (UROXATRAL) 10 MG 24 hr tablet Take 10 mg by mouth every evening. 10/20/18   [provider]  amLODipine (NORVASC) 5 MG tablet Take 5 mg by mouth daily. 12/17/21   [provider]  Calcium Carb-Cholecalciferol (CALCIUM 600/VITAMIN D3 PO) Take 1 tablet by mouth daily.    [provider]  cholecalciferol (VITAMIN D) 25 MCG (1000 UT) tablet Take 1,000 Units by mouth in the morning and at bedtime.    [provider]  Cyanocobalamin (VITAMIN B12) 1000 MCG TBCR Take 1,000 mcg by mouth in the morning and at bedtime.    [provider]  diclofenac Sodium (VOLTAREN) 1 % GEL Apply 1 Application topically 2 (two) times daily as needed (pain).    [provider]  doxycycline (VIBRA-TABS) 100 MG tablet Take 1 tablet (100 mg total) by mouth 2 (two) times daily. 08/14/21 08/14/22  Michel Bickers, MD  finasteride (PROSCAR) 5 MG tablet Take 5 mg by mouth daily. 01/14/22   [provider]  folic  acid (FOLVITE) 1 MG tablet Take 1 mg by mouth daily. 01/08/20   [provider]  golimumab (SIMPONI ARIA) 50 MG/4ML SOLN injection Inject 50 mg into the vein See admin instructions. Every 6-8 weeks 01/03/20   [provider]  iron polysaccharides (FERREX 150) 150 MG capsule Take 150 mg by mouth daily.    [provider]  lisinopril (ZESTRIL) 20 MG tablet Take 20 mg by mouth daily. 01/07/19   [provider]  Melatonin 5 MG TABS Take 5 mg by mouth at bedtime.    [provider]  methotrexate (RHEUMATREX) 2.5 MG tablet Take 10 mg by mouth See admin instructions. 10 mg twice daily every Thursday 01/31/20   [provider]  metoprolol succinate (TOPROL-XL) 50 MG 24 hr tablet Take 1 tablet (50 mg total) by mouth daily. Take with or immediately following a meal. Patient taking differently: Take 25 mg by mouth every evening. Take with or immediately following a meal. 02/07/16   Kelvin Cellar, MD  Misc Natural Products (TURMERIC CURCUMIN) CAPS Take 1 capsule by mouth daily.    [provider]  Multiple Vitamin (MULTIVITAMIN WITH MINERALS) TABS tablet Take 1 tablet by mouth daily.    [provider]  sennosides-docusate sodium (SENOKOT-S) 8.6-50 MG tablet Take 1 tablet by mouth daily as needed for constipation.    [provider]  traMADol (ULTRAM) 50 MG  tablet Take 1 tablet (50 mg total) by mouth every 6 (six) hours as needed. 02/14/22 02/14/23  Rolm Bookbinder, MD      Allergies    Other and Penicillins cross reactors    Review of Systems   Review of Systems  Musculoskeletal:  Positive for arthralgias and joint swelling.    Physical Exam Updated Vital Signs BP (!) 150/88 (BP Location: Right Arm)   Pulse 69   Temp 98.7 F (37.1 C) (Oral)   Resp 18   Ht '5\' 6"'$  (1.676 m)   Wt 68 kg   SpO2 99%   BMI 24.21 kg/m  Physical Exam Vitals and nursing note reviewed.  HENT:     Head: Normocephalic and atraumatic.      Mouth/Throat:     Mouth: Mucous membranes are moist.  Eyes:     General:        Right eye: No discharge.        Left eye: No discharge.     Conjunctiva/sclera: Conjunctivae normal.  Cardiovascular:     Rate and Rhythm: Normal rate and regular rhythm.     Pulses: Normal pulses.     Heart sounds: Normal heart sounds.  Pulmonary:     Effort: Pulmonary effort is normal.     Breath sounds: Normal breath sounds.  Abdominal:     General: Abdomen is flat.     Palpations: Abdomen is soft.  Musculoskeletal:     Right elbow: Swelling present. Tenderness present.     Left elbow: Swelling present. Tenderness present.     Right wrist: Swelling present.     Left wrist: Swelling (Erythema also noted in the left wrist) present.     Right knee: Swelling present. Decreased range of motion.     Left knee: No swelling. Decreased range of motion.  Skin:    General: Skin is warm and dry.  Neurological:     General: No focal deficit present.     Comments: Unable to assess gait due to exquisite pain.  Patient refused  Psychiatric:        Mood and Affect: Mood normal.     ED Results / Procedures / Treatments   Labs (all labs ordered are listed, but only abnormal results are displayed) Labs Reviewed  COMPREHENSIVE METABOLIC PANEL - Abnormal; Notable for the following components:      Result Value   Sodium 133 (*)    Glucose, Bld 103 (*)    Total Protein 6.3 (*)    All other components within normal limits  CBC WITH DIFFERENTIAL/PLATELET - Abnormal; Notable for the following components:   WBC 16.1 (*)    RBC 3.83 (*)    Hemoglobin 12.9 (*)    HCT 36.8 (*)    Neutro Abs 12.8 (*)    Monocytes Absolute 1.8 (*)    All other components within normal limits  URINALYSIS, ROUTINE W REFLEX MICROSCOPIC - Abnormal; Notable for the following components:   Hgb urine dipstick TRACE (*)    All other components within normal limits  MAGNESIUM  URINALYSIS, MICROSCOPIC (REFLEX)     EKG None  Radiology No results found.  Procedures Procedures    Medications Ordered in ED Medications  traMADol (ULTRAM) tablet 50 mg (has no administration in time range)  HYDROmorphone (DILAUDID) injection 0.5 mg (has no administration in time range)  amLODipine (NORVASC) tablet 5 mg (has no administration in time range)  lisinopril (ZESTRIL) tablet 20 mg (has no administration in time range)  metoprolol succinate (TOPROL-XL) 24 hr tablet 25 mg (has no administration in time range)  sennosides-docusate sodium (SENOKOT-S) 8.6-50 MG tablet 1 tablet (has no administration in time range)  melatonin tablet 5 mg (has no administration in time range)  Calcium Carb-Cholecalciferol 600-20 MG-MCG TABS (has no administration in time range)  diclofenac Sodium (VOLTAREN) 1 % topical gel 1 Application (has no administration in time range)  enoxaparin (LOVENOX) injection 40 mg (has no administration in time range)  ondansetron (ZOFRAN) tablet 4 mg (has no administration in time range)    Or  ondansetron (ZOFRAN) injection 4 mg (has no administration in time range)  senna-docusate (Senokot-S) tablet 1 tablet (has no administration in time range)  bisacodyl (DULCOLAX) EC tablet 5 mg (has no administration in time range)  acetaminophen (TYLENOL) tablet 650 mg (has no administration in time range)  lidocaine (XYLOCAINE) 1 % (with pres) injection 10 mL (has no administration in time range)  bupivacaine (MARCAINE) 0.25 % (with pres) injection 5 mL (has no administration in time range)  methylPREDNISolone acetate (DEPO-MEDROL) injection 40 mg (has no administration in time range)  sodium chloride 0.9 % bolus 1,000 mL (has no administration in time range)    ED Course/ Medical Decision Making/ A&P                           Medical Decision Making Amount and/or Complexity of Data Reviewed Labs: ordered.  Risk Decision regarding hospitalization.   This patient presents to the ED for  concern of generalized pain, this involves a number of treatment options, and is a complaint that carries with it a moderate risk of complications and morbidity.  The differential diagnosis includes pain related to rheumatoid arthritis, electrolyte derangement, and septic arthritis.   Co morbidities: Discussed in HPI   EMR reviewed including pt PMHx, past surgical history and past visits to ER.   See HPI for more details   Lab Tests:   I ordered and independently interpreted labs. Labs notable for leukocytosis, anemia   Imaging Studies:  No imaging studies ordered for this patient    Cardiac Monitoring:  The patient was maintained on a cardiac monitor.  I personally viewed and interpreted the cardiac monitored which showed an underlying rhythm of: NSR NA   Medicines ordered:  No medications ordered.  Reevaluation of the patient after these medicines showed that the patient stayed the same I have reviewed the patients home medicines and have made adjustments as needed   Consults/Attending Physician   I requested consultation with Dr. Wynetta Fines,  and discussed lab and imaging findings as well as pertinent plan -  recommends: Admission to the hospital for untreated rheumatoid arthritis and generalized pain resulting in acute lack of immobility.   Reevaluation:  After the interventions noted above I re-evaluated patient and found that they have :stayed the same   Problem List / ED Course: Patient presented for generalized pain which has been ongoing for couple days.  Patient has known history of rheumatoid arthritis which has not been treated for the last month due to a recent inguinal hernia repair.  Exam revealed exquisite tenderness to most of his joints bilateral swelling of the knees elbows and hands.  Right knee was notably more swollen than the left which was initially concerning for possible septic arthritis.  Patient does have a white blood cell count but no  fever.  Also the knee was not red or more warm than the  other.  Suspicion is low but did inform hospital team of this finding.  Per his assessment hospital attending it may be a Baker's cyst.  Consulted hospital team for admission for generalized pain likely related to untreated rheumatoid arthritis.     Dispostion:  After consideration of the diagnostic results and the patients response to treatment, I feel that the patent would benefit from admission to the hospital for untreated rheumatoid arthritis and generalized pain causing acute lack of immobility.         Final Clinical Impression(s) / ED Diagnoses Final diagnoses:  Arthralgia, unspecified joint  Pain and swelling of right knee    Rx / DC Orders ED Discharge Orders     None         Levorn, Oleski, PA-C 03/02/22 1534    Audley Hose, MD 03/02/22 2046

## 2022-03-03 ENCOUNTER — Observation Stay (HOSPITAL_COMMUNITY): Payer: Medicare Other

## 2022-03-03 DIAGNOSIS — M1711 Unilateral primary osteoarthritis, right knee: Secondary | ICD-10-CM | POA: Diagnosis present

## 2022-03-03 DIAGNOSIS — Z85820 Personal history of malignant melanoma of skin: Secondary | ICD-10-CM | POA: Diagnosis not present

## 2022-03-03 DIAGNOSIS — Z880 Allergy status to penicillin: Secondary | ICD-10-CM | POA: Diagnosis not present

## 2022-03-03 DIAGNOSIS — M25461 Effusion, right knee: Secondary | ICD-10-CM | POA: Diagnosis present

## 2022-03-03 DIAGNOSIS — M179 Osteoarthritis of knee, unspecified: Secondary | ICD-10-CM | POA: Diagnosis present

## 2022-03-03 DIAGNOSIS — Z8042 Family history of malignant neoplasm of prostate: Secondary | ICD-10-CM | POA: Diagnosis not present

## 2022-03-03 DIAGNOSIS — M19012 Primary osteoarthritis, left shoulder: Secondary | ICD-10-CM | POA: Diagnosis not present

## 2022-03-03 DIAGNOSIS — Z87891 Personal history of nicotine dependence: Secondary | ICD-10-CM | POA: Diagnosis not present

## 2022-03-03 DIAGNOSIS — Z792 Long term (current) use of antibiotics: Secondary | ICD-10-CM | POA: Diagnosis not present

## 2022-03-03 DIAGNOSIS — I10 Essential (primary) hypertension: Secondary | ICD-10-CM | POA: Diagnosis present

## 2022-03-03 DIAGNOSIS — M25562 Pain in left knee: Secondary | ICD-10-CM | POA: Diagnosis present

## 2022-03-03 DIAGNOSIS — Z91013 Allergy to seafood: Secondary | ICD-10-CM | POA: Diagnosis not present

## 2022-03-03 DIAGNOSIS — Z79899 Other long term (current) drug therapy: Secondary | ICD-10-CM | POA: Diagnosis not present

## 2022-03-03 DIAGNOSIS — M25561 Pain in right knee: Secondary | ICD-10-CM | POA: Diagnosis present

## 2022-03-03 DIAGNOSIS — M4644 Discitis, unspecified, thoracic region: Secondary | ICD-10-CM | POA: Diagnosis present

## 2022-03-03 DIAGNOSIS — M7121 Synovial cyst of popliteal space [Baker], right knee: Secondary | ICD-10-CM | POA: Diagnosis present

## 2022-03-03 DIAGNOSIS — M00861 Arthritis due to other bacteria, right knee: Secondary | ICD-10-CM | POA: Diagnosis present

## 2022-03-03 DIAGNOSIS — M25512 Pain in left shoulder: Secondary | ICD-10-CM | POA: Diagnosis present

## 2022-03-03 DIAGNOSIS — N400 Enlarged prostate without lower urinary tract symptoms: Secondary | ICD-10-CM | POA: Diagnosis present

## 2022-03-03 DIAGNOSIS — M0579 Rheumatoid arthritis with rheumatoid factor of multiple sites without organ or systems involvement: Secondary | ICD-10-CM | POA: Diagnosis present

## 2022-03-03 DIAGNOSIS — Z8614 Personal history of Methicillin resistant Staphylococcus aureus infection: Secondary | ICD-10-CM | POA: Diagnosis not present

## 2022-03-03 LAB — BASIC METABOLIC PANEL
Anion gap: 9 (ref 5–15)
BUN: 23 mg/dL (ref 8–23)
CO2: 25 mmol/L (ref 22–32)
Calcium: 9.7 mg/dL (ref 8.9–10.3)
Chloride: 99 mmol/L (ref 98–111)
Creatinine, Ser: 0.97 mg/dL (ref 0.61–1.24)
GFR, Estimated: >60 mL/min (ref >60)
Glucose, Bld: 100 mg/dL — ABNORMAL HIGH (ref 70–99)
Potassium: 3.8 mmol/L (ref 3.5–5.1)
Sodium: 133 mmol/L — ABNORMAL LOW (ref 135–145)

## 2022-03-03 LAB — CBC
HCT: 32.6 % — ABNORMAL LOW (ref 39.0–52.0)
Hemoglobin: 10.9 g/dL — ABNORMAL LOW (ref 13.0–17.0)
MCH: 32.5 pg (ref 26.0–34.0)
MCHC: 33.4 g/dL (ref 30.0–36.0)
MCV: 97.3 fL (ref 80.0–100.0)
Platelets: 192 10*3/uL (ref 150–400)
RBC: 3.35 MIL/uL — ABNORMAL LOW (ref 4.22–5.81)
RDW: 12.1 % (ref 11.5–15.5)
WBC: 10.8 10*3/uL — ABNORMAL HIGH (ref 4.0–10.5)
nRBC: 0 % (ref 0.0–0.2)

## 2022-03-03 LAB — C-REACTIVE PROTEIN: CRP: 19.3 mg/dL — ABNORMAL HIGH (ref <1.0)

## 2022-03-03 LAB — SEDIMENTATION RATE: Sed Rate: 64 mm/hr — ABNORMAL HIGH (ref 0–16)

## 2022-03-03 MED ORDER — DOXYCYCLINE HYCLATE 100 MG PO TABS
100.0000 mg | ORAL_TABLET | Freq: Two times a day (BID) | ORAL | Status: DC
  Administered 2022-03-03 – 2022-03-06 (×7): 100 mg via ORAL
  Filled 2022-03-03 (×7): qty 1

## 2022-03-03 NOTE — Progress Notes (Signed)
Synovial WBC 16,800 w/ 89% N Cystal ID (-) Gram stain (-) Culture pending  Suspect CPPD vs RA flare. Will follow culture.

## 2022-03-03 NOTE — Care Management Obs Status (Signed)
[facility] NOTIFICATION   Patient Details  Name: Tanner Rice MRN: [id number] Date of Birth: 07-20-1933   Medicare Observation Status Notification Given:  Yes    Bartholomew Crews, RN 03/03/2022, 2:44 PM

## 2022-03-03 NOTE — Progress Notes (Signed)
PROGRESS NOTE  Tanner Rice ACZ:[id number] DOB: 01-24-1934 DOA: 03/02/2022 PCP: Lajean Manes, MD   LOS: 0 days   Brief Narrative / Interim history: 88 year old male with history of RA on DMARDs, HTN, BPH, Baker's cyst, prior MRSA discitis and chronic suppressive antibiotics, recent right groin hernia repair 2 weeks ago, comes to the hospital with right knee swelling and pain.  Orthopedic surgery was consulted in the ER and underwent fluid aspiration and steroid injection  Subjective / 24h Interval events: Complains of new left knee pain, left shoulder pain and difficulties raising his arm  Assesement and Plan: Principal problem Right knee osteoarthritis-more likely diagnosis as opposed to septic arthritis.  Joint fluid analysis was negative for crystals, but it did show 16,800 WBC with 89% neutrophils, somewhat borderline for septic arthritis.  He does have a history of MRSA bacteremia and thoracic discitis on suppressive antibiotics.  ID consulted, appreciate input -gram stain negative, monitor cultures. If no growth ~48h, can probably start prednisone for his RA (see below)  Active problems Left knee pain, left shoulder pain-no swelling noted, no warmth, but concerning.  Monitor overnight to ensure does not develop swelling/redness.  Plain x-rays without significant acute findings  History of MRSA bacteremia as well as thoracic discitis in 2017-on suppressive antibiotics at home  Rheumatoid arthritis-did not get most recent dose of Simponi due to recent hernia repair. There is a possibility that his current episode is related to RA. He currently is mobile but wife is very concerned about him going up and down the stairs (2 level home), also getting in a car. He will need to travel to [facility] for his Simponi hopefully soon. If infection is ruled out can probably use a short course of Prednisone to help him regain some mobility  Essential hypertension-continue amlodipine,  lisinopril  Leukocytosis-possibly reactive, improved this morning  Scheduled Meds:  amLODipine  5 mg Oral Daily   calcium-vitamin D   Oral Daily   enoxaparin (LOVENOX) injection  40 mg Subcutaneous Q24H   lisinopril  20 mg Oral Daily   melatonin  5 mg Oral QHS   metoprolol succinate  25 mg Oral QPM   Continuous Infusions: PRN Meds:.acetaminophen, bisacodyl, diclofenac Sodium, HYDROmorphone (DILAUDID) injection, ondansetron **OR** ondansetron (ZOFRAN) IV, senna-docusate, senna-docusate, traMADol  Current Outpatient Medications  Medication Instructions   acetaminophen (TYLENOL) 650 mg, Oral, Every 8 hours PRN   alfuzosin (UROXATRAL) 10 mg, Oral, Every evening   amLODipine (NORVASC) 5 mg, Oral, Daily   Calcium Carb-Cholecalciferol (CALCIUM 600/VITAMIN D3 PO) 1 tablet, Oral, Daily   cholecalciferol (VITAMIN D3) 1,000 Units, Oral, 2 times daily   diclofenac Sodium (VOLTAREN) 1 % GEL 1 Application, Topical, 2 times daily PRN   doxycycline (VIBRA-TABS) 100 mg, Oral, 2 times daily   finasteride (PROSCAR) 5 mg, Oral, Daily   folic acid (FOLVITE) 1 mg, Oral, Daily   golimumab (SIMPONI ARIA) 50 mg, Intravenous, See admin instructions, Inject 50 mg intravenous Every 6-8 weeks per patient    iron polysaccharides (FERREX 150) 150 mg, Oral, Daily   lisinopril (ZESTRIL) 20 mg, Oral, Daily   melatonin 5 mg, Oral, Daily at bedtime   methotrexate (RHEUMATREX) 10 mg, Oral, See admin instructions, Take 10 mg twice daily every Thursday per patient    metoprolol succinate (TOPROL-XL) 50 mg, Oral, Daily, Take with or immediately following a meal.   Misc Natural Products (TURMERIC CURCUMIN) CAPS 1 capsule, Oral, Daily   Multiple Vitamin (MULTIVITAMIN WITH MINERALS) TABS tablet 1 tablet, Oral, Daily  sennosides-docusate sodium (SENOKOT-S) 8.6-50 MG tablet 1 tablet, Oral, Daily PRN   traMADol (ULTRAM) 50 mg, Oral, Every 6 hours PRN   Vitamin B12 1,000 mcg, Oral, 2 times daily    Diet Orders (From  admission, onward)     Start     Ordered   03/02/22 1629  DIET DYS 3 Room service appropriate? Yes; Fluid consistency: Thin  Diet effective now       Question Answer Comment  Room service appropriate? Yes   Fluid consistency: Thin      03/02/22 1628            DVT prophylaxis: enoxaparin (LOVENOX) injection 40 mg Start: 03/02/22 2200   Lab Results  Component Value Date   PLT 192 03/03/2022      Code Status: Full Code  Family Communication: no family at bedside   Status is: Observation  The patient will require care spanning > 2 midnights and should be moved to inpatient because: New onset left knee and left shoulder pain  Level of care: Med-Surg  Consultants:  ID Ortho  Objective: Vitals:   03/02/22 1730 03/02/22 1850 03/02/22 2012 03/03/22 0945  BP: 109/61 137/61 123/78 125/64  Pulse: 60 66 61 69  Resp: '18 16 17 16  '$ Temp: 99.7 F (37.6 C) 98.1 F (36.7 C) 98.2 F (36.8 C) 97.6 F (36.4 C)  TempSrc: Oral Oral Oral Oral  SpO2: 95% 100% 99% 98%  Weight:      Height:        Intake/Output Summary (Last 24 hours) at 03/03/2022 1235 Last data filed at 03/03/2022 0500 Gross per 24 hour  Intake 240 ml  Output --  Net 240 ml   Wt Readings from Last 3 Encounters:  03/02/22 68 kg  02/14/22 68 kg  02/06/22 68.4 kg    Examination:  Constitutional: NAD Eyes: no scleral icterus ENMT: Mucous membranes are moist.  Neck: normal, supple Respiratory: clear to auscultation bilaterally, no wheezing, no crackles. Normal respiratory effort. No accessory muscle use.  Cardiovascular: Regular rate and rhythm, no murmurs / rubs / gallops. No LE edema.  Abdomen: non distended, no tenderness. Bowel sounds positive.  Musculoskeletal: no clubbing / cyanosis.  No swelling left knee or left shoulder Skin: no rashes Neurologic: non focal   Data Reviewed: I have independently reviewed following labs and imaging studies   CBC Recent Labs  Lab 03/02/22 1305  03/03/22 0103  WBC 16.1* 10.8*  HGB 12.9* 10.9*  HCT 36.8* 32.6*  PLT 183 192  MCV 96.1 97.3  MCH 33.7 32.5  MCHC 35.1 33.4  RDW 12.1 12.1  LYMPHSABS 1.4  --   MONOABS 1.8*  --   EOSABS 0.0  --   BASOSABS 0.0  --     Recent Labs  Lab 03/02/22 1305 03/03/22 0103  NA 133* 133*  K 3.8 3.8  CL 102 99  CO2 23 25  GLUCOSE 103* 100*  BUN 17 23  CREATININE 0.88 0.97  CALCIUM 9.8 9.7  AST 21  --   ALT 15  --   ALKPHOS 62  --   BILITOT 1.1  --   ALBUMIN 3.5  --   MG 1.9  --     ------------------------------------------------------------------------------------------------------------------ No results for input(s): "CHOL", "HDL", "LDLCALC", "TRIG", "CHOLHDL", "LDLDIRECT" in the last 72 hours.  Lab Results  Component Value Date   HGBA1C 5.0 10/06/2020   ------------------------------------------------------------------------------------------------------------------ No results for input(s): "TSH", "T4TOTAL", "T3FREE", "THYROIDAB" in the last 72 hours.  Invalid  input(s): "FREET3"  Cardiac Enzymes No results for input(s): "CKMB", "TROPONINI", "MYOGLOBIN" in the last 168 hours.  Invalid input(s): "CK" ------------------------------------------------------------------------------------------------------------------ No results found for: "BNP"  CBG: No results for input(s): "GLUCAP" in the last 168 hours.  Recent Results (from the past 240 hour(s))  Body fluid culture w Gram Stain     Status: None (Preliminary result)   Collection Time: 03/02/22  4:28 PM   Specimen: Synovium; Synovial Fluid  Result Value Ref Range Status   Specimen Description SYNOVIAL RIGHT KNEE  Final   Special Requests NONE  Final   Gram Stain   Final    FEW WBC PRESENT, PREDOMINANTLY PMN NO ORGANISMS SEEN Performed at [facility], [address], [place], [place] [address]    Culture PENDING  Incomplete   Report Status PENDING  Incomplete     Radiology Studies: DG Shoulder Left  Port  Result Date: 03/03/2022 CLINICAL DATA:  Left shoulder and knee pain EXAM: LEFT SHOULDER three views COMPARISON:  None Available. FINDINGS: No evidence of fracture or malalignment. The humeral head is located. Degenerative osteoarthritis present at the acromioclavicular joint with a downward directed osteophyte arising from the acromion process. Remote healed left rib fracture. No lytic or blastic osseous lesion. IMPRESSION: 1. Degenerative osteoarthritis at the left glenohumeral and acromioclavicular joints. 2. No acute fracture or malalignment. Electronically Signed   By: Jacqulynn Cadet M.D.   On: 03/03/2022 11:04   DG Knee Complete 4 Views Left  Result Date: 03/03/2022 CLINICAL DATA:  Left knee pain beginning yesterday. No known injury. EXAM: LEFT KNEE - COMPLETE 4+ VIEW COMPARISON:  None Available. FINDINGS: No evidence of fracture, dislocation, or joint effusion. Mild chondrocalcinosis is noted without other signs of arthropathy. No focal bone lesions identified. Peripheral vascular calcification noted. IMPRESSION: No acute findings. Mild chondrocalcinosis. Electronically Signed   By: Marlaine Hind M.D.   On: 03/03/2022 11:03   DG Knee Right Port  Result Date: 03/02/2022 CLINICAL DATA:  Generalized pain, no reported injury EXAM: PORTABLE RIGHT KNEE - 1-2 VIEW COMPARISON:  None Available. FINDINGS: No fracture or dislocation. Apparent moderate suprapatellar right knee joint effusion on the limited slightly obliqued cross-table lateral view. Mild tricompartmental right knee osteoarthritis with meniscal chondrocalcinosis. No suspicious focal osseous lesions. No radiopaque foreign bodies. IMPRESSION: Apparent moderate suprapatellar right knee joint effusion. No fracture or malalignment. Mild tricompartmental right knee osteoarthritis with meniscal chondrocalcinosis, suggesting CPPD arthropathy. Electronically Signed   By: Ilona Sorrel M.D.   On: 03/02/2022 15:47     Marzetta Board, MD,  PhD [facility]  Between 7 am - 7 pm I am available, please contact me via Amion (for emergencies) or Securechat (non urgent messages)  Between 7 pm - 7 am I am not available, please contact night coverage MD/APP via Amion

## 2022-03-03 NOTE — Evaluation (Signed)
Physical Therapy Evaluation Patient Details Name: Tanner Rice MRN: [id number] DOB: 22-Feb-1934 Today's Date: 03/03/2022  History of Present Illness  Pt is a 88 y.o.M who presents 03/02/2022 with suddent onset of right knee pain and ambulation dysfunction now s/p knee aspiration. Significant PMH: RA, HTN, BPH, remote history of Baker's cyst.  Clinical Impression  PTA, pt lives in a two story home with his spouse and is independent with mobility using a cane. Pt now reporting left shoulder and knee pain with active flexion and displays decreased gait speed and impaired standing balance in comparison to baseline. Pt ambulating 120 ft with a walker at a supervision level, displaying slowed, step to pattern. Would benefit from HHPT at d/c to address deficit and maximize functional mobility.     Recommendations for follow up therapy are one component of a multi-disciplinary discharge planning process, led by the attending physician.  Recommendations may be updated based on patient status, additional functional criteria and insurance authorization.  Follow Up Recommendations Home health PT      Assistance Recommended at Discharge PRN  Patient can return home with the following  Assistance with cooking/housework;Assist for transportation;Help with stairs or ramp for entrance    Equipment Recommendations Rolling walker (2 wheels)  Recommendations for Other Services       Functional Status Assessment Patient has had a recent decline in their functional status and demonstrates the ability to make significant improvements in function in a reasonable and predictable amount of time.     Precautions / Restrictions Precautions Precautions: Fall Restrictions Weight Bearing Restrictions: No      Mobility  Bed Mobility Overal bed mobility: Needs Assistance Bed Mobility: Supine to Sit, Sit to Supine     Supine to sit: Modified independent (Device/Increase time) Sit to supine: Min assist    General bed mobility comments: MinA for LE negotiation back into bed    Transfers Overall transfer level: Needs assistance Equipment used: Rolling walker (2 wheels) Transfers: Sit to/from Stand Sit to Stand: Min guard                Ambulation/Gait Ambulation/Gait assistance: Supervision Gait Distance (Feet): 120 Feet Assistive device: Rolling walker (2 wheels) Gait Pattern/deviations: Step-to pattern, Decreased stride length Gait velocity: decreased Gait velocity interpretation: <1.8 ft/sec, indicate of risk for recurrent falls   General Gait Details: Slowed, step to pattern with supervision for safety  Stairs            Wheelchair Mobility    Modified Rankin (Stroke Patients Only)       Balance Overall balance assessment: Needs assistance Sitting-balance support: Feet supported Sitting balance-Leahy Scale: Good Sitting balance - Comments: Able to don R sock edge of bed   Standing balance support: Bilateral upper extremity supported Standing balance-Leahy Scale: Poor Standing balance comment: reliant on RW                             Pertinent Vitals/Pain Pain Assessment Pain Assessment: Faces Faces Pain Scale: Hurts little more Pain Location: L shoulder and knee Pain Descriptors / Indicators: Discomfort, Grimacing, Guarding Pain Intervention(s): Monitored during session    Home Living Family/patient expects to be discharged to:: Private residence Living Arrangements: Spouse/significant other Available Help at Discharge: Family Type of Home: House Home Access: Stairs to enter Entrance Stairs-Rails: Psychiatric nurse of Steps: 4 Alternate Level Stairs-Number of Steps:  (flight) Home Layout: Two level Home Equipment: Cane - single point  Prior Function Prior Level of Function : Needs assist;Driving             Mobility Comments: uses cane ADLs Comments: independent     Hand Dominance         Extremity/Trunk Assessment   Upper Extremity Assessment Upper Extremity Assessment: Defer to OT evaluation;LUE deficits/detail LUE Deficits / Details: Shoulder flexion AROM to ~80 degrees, passively [facility]    Lower Extremity Assessment Lower Extremity Assessment: LLE deficits/detail LLE Deficits / Details: Pain with active knee flexion. ROM WFL    Cervical / Trunk Assessment Cervical / Trunk Assessment: Kyphotic  Communication   Communication: No difficulties  Cognition Arousal/Alertness: Awake/alert Behavior During Therapy: WFL for tasks assessed/performed Overall Cognitive Status: Within Functional Limits for tasks assessed                                          General Comments      Exercises     Assessment/Plan    PT Assessment Patient needs continued PT services  PT Problem List Decreased strength;Decreased balance;Decreased activity tolerance;Decreased mobility;Pain       PT Treatment Interventions DME instruction;Gait training;Stair training;Functional mobility training;Therapeutic activities;Therapeutic exercise;Balance training;Patient/family education    PT Goals (Current goals can be found in the Care Plan section)  Acute Rehab PT Goals Patient Stated Goal: back to baseline PT Goal Formulation: With patient Time For Goal Achievement: 03/17/22 Potential to Achieve Goals: Good    Frequency Min 3X/week     Co-evaluation               AM-PAC PT "6 Clicks" Mobility  Outcome Measure Help needed turning from your back to your side while in a flat bed without using bedrails?: None Help needed moving from lying on your back to sitting on the side of a flat bed without using bedrails?: None Help needed moving to and from a bed to a chair (including a wheelchair)?: A Little Help needed standing up from a chair using your arms (e.g., wheelchair or bedside chair)?: A Little Help needed to walk in hospital room?: A Little Help needed  climbing 3-5 steps with a railing? : A Little 6 Click Score: 20    End of Session Equipment Utilized During Treatment: Gait belt Activity Tolerance: Patient tolerated treatment well Patient left: with call bell/phone within reach;in bed;Other (comment) (for xray) Nurse Communication: Mobility status PT Visit Diagnosis: Unsteadiness on feet (R26.81);Difficulty in walking, not elsewhere classified (R26.2);Pain Pain - Right/Left: Left Pain - part of body: Knee;Shoulder    Time: 8938-1017 PT Time Calculation (min) (ACUTE ONLY): 24 min   Charges:   PT Evaluation $PT Eval Low Complexity: 1 Low PT Treatments $Therapeutic Activity: 8-22 mins        Wyona Almas, PT, DPT Acute Rehabilitation Services Office [phone]   Tanner Rice 03/03/2022, 9:01 AM

## 2022-03-03 NOTE — Consult Note (Signed)
[facility]  Total days of antibiotics 0 Reason for Consult:right knee effusion    Referring Physician: gherghe  Principal Problem:   Baker cyst, right    HPI: Tanner Rice is a 88 y.o. 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 His imaging of right knee showed Apparent moderate suprapatellar right knee joint effusion. Mild tricompartmental right knee osteoarthritis with meniscal chondrocalcinosis, suggesting CPPD arthropathy. He underwent aspiration of right knee where 20m removed with 16,800 cells with 89% N. Gram stain only showing WBC. This morning he states the right knee is much improved with steroids however, he is now having pains to left shoulder and left knee. No onset of fevers/chills/nightsweats but his wife thinks he did have rigors on day of admit. His symptoms started roughly 24hr prior to admit. [place]. He has not had his golimumab infusion for > 6 wk since it was postponed due to his inguinal surgery on 9/7.  Past Medical History:  Diagnosis Date   Hypertension    [place]    upper thigh on right leg   Rheumatoid arthritis(714.0)    Thoracic discitis     Allergies:  Allergies  Allergen Reactions   Other Diarrhea, Nausea And Vomiting and Other (See Comments)    Seafood - mussels    Penicillins Cross Reactors Hives    Did it involve swelling of the face/tongue/throat, SOB, or low BP? Unknown Did it involve sudden or severe rash/hives, skin peeling, or any reaction on the inside of your mouth or nose? Unknown Did you need to seek medical attention at a hospital or doctor's office? Yes When did it last happen? over 65 years ago     If all  above answers are "NO", may proceed with cephalosporin use. .Marland Kitchen    MEDICATIONS:  amLODipine  5 mg Oral Daily   calcium-vitamin D   Oral Daily   enoxaparin (LOVENOX) injection  40 mg Subcutaneous Q24H   lisinopril  20 mg Oral Daily   melatonin  5 mg Oral QHS   metoprolol succinate  25 mg Oral QPM    Social History   Tobacco Use   Smoking status: Former    Types: Pipe, Cigars, Cigarettes   Smokeless tobacco: Never   Tobacco comments:    quit 42 yrs. ago  Vaping Use   Vaping Use: Never used  Substance Use Topics   Alcohol use: Yes    Comment: 4x week glass wine   Drug use: No    Family History  Problem Relation Age of Onset   Prostate cancer Father    Prostate cancer Brother    Osteoarthritis Brother     Review of Systems -  12 point ros is negative except what is mentioned above  OBJECTIVE: Temp:  [97.6 F (36.4 C)-99.7 F (37.6 C)] 97.6 F (36.4 C) (09/24 0945) Pulse Rate:  [57-78] 69 (09/24 0945) Resp:  [16-18] 16 (09/24 0945) BP: (104-188)/(57-89) 125/64 (09/24 0945) SpO2:  [94 %-100 %] 98 % (09/24 0945) Weight:  [68 kg] 68 kg (09/23 1153) Physical Exam  Constitutional: He is oriented to person, place, and time. He appears well-developed and well-nourished. No distress.  HENT:  Mouth/Throat: Oropharynx is clear and moist. No oropharyngeal exudate.  Cardiovascular: Normal rate, regular rhythm and normal heart sounds. Exam reveals no gallop and no friction rub.  No murmur heard.  Pulmonary/Chest: Effort normal and breath sounds normal. No respiratory distress. He has no wheezes.  Abdominal: Soft. Bowel sounds are normal. He exhibits no distension. There is no tenderness.  Lymphadenopathy:  He has no cervical adenopathy.  Neurological: He is alert and oriented to person, place, and time.  Ext:no effusion to left knee. Right knee still appears swollen. Limited range of motion to left shoulder difficulty raising arm above head. Swollen fingers to left hand   Skin: Skin is warm and dry. No rash noted. No erythema.  Psychiatric: He has a normal mood and affect. His behavior is normal.    LABS: Results for orders placed or performed during the hospital encounter of 03/02/22 (from the past 48 hour(s))  Comprehensive metabolic panel     Status: Abnormal   Collection Time: 03/02/22  1:05 PM  Result Value Ref Range   Sodium 133 (L) 135 - 145 mmol/L   Potassium 3.8 3.5 - 5.1 mmol/L   Chloride 102 98 - 111 mmol/L   CO2 23 22 - 32 mmol/L   Glucose, Bld 103 (H) 70 - 99 mg/dL    Comment: Glucose reference range applies only to samples taken after fasting for at least 8 hours.   BUN 17 8 - 23 mg/dL   Creatinine, Ser 0.88 0.61 - 1.24 mg/dL   Calcium 9.8 8.9 - 10.3 mg/dL   Total Protein 6.3 (L) 6.5 - 8.1 g/dL   Albumin 3.5 3.5 - 5.0 g/dL   AST 21 15 - 41 U/L   ALT 15 0 - 44 U/L   Alkaline Phosphatase 62 38 - 126 U/L   Total Bilirubin 1.1 0.3 - 1.2 mg/dL   GFR, Estimated >60 >60 mL/min    Comment: (NOTE) Calculated using the CKD-EPI Creatinine Equation (2021)    Anion gap 8 5 - 15    Comment: Performed at [facility] [address], [place], [place] [address]  CBC with Differential     Status: Abnormal   Collection Time: 03/02/22  1:05 PM  Result Value Ref Range   WBC 16.1 (H) 4.0 - 10.5 K/uL   RBC 3.83 (L) 4.22 - 5.81 MIL/uL   Hemoglobin 12.9 (L) 13.0 - 17.0 g/dL   HCT 36.8 (L) 39.0 - 52.0 %   MCV 96.1 80.0 - 100.0 fL   MCH 33.7 26.0 - 34.0 pg   MCHC 35.1 30.0 - 36.0 g/dL   RDW 12.1 11.5 - 15.5 %   Platelets 183 150 - 400 K/uL   nRBC 0.0 0.0 - 0.2 %   Neutrophils Relative % 80 %   Neutro Abs 12.8 (H) 1.7 - 7.7 K/uL   Lymphocytes Relative 9 %   Lymphs Abs 1.4 0.7 - 4.0 K/uL   Monocytes Relative 11 %   Monocytes Absolute 1.8 (H) 0.1 - 1.0 K/uL   Eosinophils Relative 0 %   Eosinophils Absolute 0.0 0.0 - 0.5 K/uL   Basophils Relative 0 %   Basophils Absolute 0.0 0.0 - 0.1 K/uL   Immature Granulocytes 0 %   Abs Immature  Granulocytes 0.07 0.00 - 0.07 K/uL    Comment: Performed at [facility], [place] [address], [place], [place] [address]  Magnesium     Status: None   Collection Time: 03/02/22  1:05 PM  Result  Value Ref Range   Magnesium 1.9 1.7 - 2.4 mg/dL    Comment: Performed at [facility], [place] [address], [place], [place] [address]  Urinalysis, Routine w reflex microscopic Urine, Clean Catch     Status: Abnormal   Collection Time: 03/02/22  1:06 PM  Result Value Ref Range   Color, Urine YELLOW YELLOW   APPearance CLEAR CLEAR   Specific Gravity, Urine 1.010 1.005 - 1.030   pH 6.5 5.0 - 8.0   Glucose, UA NEGATIVE NEGATIVE mg/dL   Hgb urine dipstick TRACE (A) NEGATIVE   Bilirubin Urine NEGATIVE NEGATIVE   Ketones, ur NEGATIVE NEGATIVE mg/dL   Protein, ur NEGATIVE NEGATIVE mg/dL   Nitrite NEGATIVE NEGATIVE   Leukocytes,Ua NEGATIVE NEGATIVE    Comment: Performed at [facility] [address] [place], [place] [address]  Urinalysis, Microscopic (reflex)     Status: None   Collection Time: 03/02/22  1:06 PM  Result Value Ref Range   RBC / HPF NONE SEEN 0 - 5 RBC/hpf   WBC, UA NONE SEEN 0 - 5 WBC/hpf   Bacteria, UA NONE SEEN NONE SEEN   Squamous Epithelial / LPF 0-5 0 - 5    Comment: Performed at [facility], [place] [address], [place], [place] [address]  Synovial cell count + diff, w/ crystals     Status: Abnormal   Collection Time: 03/02/22  4:28 PM  Result Value Ref Range   Color, Synovial YELLOW YELLOW   Appearance-Synovial TURBID (A) CLEAR   Crystals, Fluid NO CRYSTALS SEEN    WBC, Synovial 16,800 (H) 0 - 200 /cu mm   Neutrophil, Synovial 89 (H) 0 - 25 %   Lymphocytes-Synovial Fld 4 0 - 20 %   Monocyte-Macrophage-Synovial Fluid 7 (L) 50 - 90 %   Eosinophils-Synovial 0 0 - 1 %    Comment: Performed at [facility] [address], [place], [place] [address]  CBC     Status: Abnormal   Collection Time: 03/03/22  1:03 AM  Result Value Ref Range   WBC 10.8 (H)  4.0 - 10.5 K/uL   RBC 3.35 (L) 4.22 - 5.81 MIL/uL   Hemoglobin 10.9 (L) 13.0 - 17.0 g/dL   HCT 32.6 (L) 39.0 - 52.0 %   MCV 97.3 80.0 - 100.0 fL   MCH 32.5 26.0 - 34.0 pg   MCHC 33.4 30.0 - 36.0 g/dL   RDW 12.1 11.5 - 15.5 %   Platelets 192 150 - 400 K/uL   nRBC 0.0 0.0 - 0.2 %    Comment: Performed at [facility], [place] [address] [place], [place] [address]  Basic metabolic panel     Status: Abnormal   Collection Time: 03/03/22  1:03 AM  Result Value Ref Range   Sodium 133 (L) 135 - 145 mmol/L   Potassium 3.8 3.5 - 5.1 mmol/L   Chloride 99 98 - 111 mmol/L   CO2 25 22 - 32 mmol/L   Glucose, Bld 100 (H) 70 - 99 mg/dL    Comment: Glucose reference range applies only to samples taken after fasting for at least 8 hours.   BUN 23 8 - 23 mg/dL   Creatinine, Ser 0.97 0.61 - 1.24 mg/dL   Calcium 9.7 8.9 - 10.3 mg/dL   GFR, Estimated >60 >60 mL/min    Comment: (NOTE) Calculated using the CKD-EPI Creatinine Equation (2021)    Anion gap 9 5 - 15  Comment: Performed at [facility], [place] [address] [place], [place] [address]     MICRO: 9/23 synovial fluid (culture only set up started on 9.24)  IMAGING: DG Knee Right Port  Result Date: 03/02/2022 CLINICAL DATA:  Generalized pain, no reported injury EXAM: PORTABLE RIGHT KNEE - 1-2 VIEW COMPARISON:  None Available. FINDINGS: No fracture or dislocation. Apparent moderate suprapatellar right knee joint effusion on the limited slightly obliqued cross-table lateral view. Mild tricompartmental right knee osteoarthritis with meniscal chondrocalcinosis. No suspicious focal osseous lesions. No radiopaque foreign bodies. IMPRESSION: Apparent moderate suprapatellar right knee joint effusion. No fracture or malalignment. Mild tricompartmental right knee osteoarthritis with meniscal chondrocalcinosis, suggesting CPPD arthropathy. Electronically Signed   By: Ilona Sorrel M.D.   On: 03/02/2022 15:47     Assessment/Plan:  88yo M with hx of RA  on DMARDs and MTX, also remove hx of MRSA discitis on chronic doxycycline initially admitted for right knee effusion concern for septic arthritis due to meeting lower threshold for septic arthritis but now having more joints involved appearing more consistent with RA flare  - recommend to restart doxy '100mg'$  bid for MRSA discitis chronic suppression - right knee effusion suspect that its c/w RA flare given more joints now involved, however being an immunocompromised host - still need to have low threshold for treatment. Will recommend to continue to follow cultures for 48hrs to see if need treatment. He is at higher risk for PsA pathogens as well.synovial fluid cx was set up on morning of 9/24 (initially fluid was sent to refrigerator thinking it was a urine specimen for unclear reasons)  - will check sed rate and crp - can follow up with dr Megan Salon after discharge

## 2022-03-03 NOTE — TOC Initial Note (Signed)
Transition of Care [facility]) - Initial/Assessment Note    Patient Details  Name: Tanner Rice MRN: [id number] Date of Birth: 12-02-1933  Transition of Care [facility] CM/SW Contact:    Bartholomew Crews, RN Phone Number: [phone] 03/03/2022, 2:46 PM  Clinical Narrative:                  Spoke with patient's spouse on hospital room phone to discuss post acute transition. Discussed PT recommendations for [facility] - spouse politely declined [facility] stating that patient would not be agreeable to [facility]. Advised that if wanted in the future, PCP can place referral. Spouse verbalized understanding. TOC available to assist with transition needs.   Expected Discharge Plan: Home/Self Care Barriers to Discharge: Continued Medical Work up   Patient Goals and CMS Choice Patient states their goals for this hospitalization and ongoing recovery are:: home with wife CMS Medicare.gov Compare Post Acute Care list provided to:: Patient (wife) Choice offered to / list presented to : Patient, Spouse  Expected Discharge Plan and Services Expected Discharge Plan: Home/Self Care   Discharge Planning Services: CM Consult Post Acute Care Choice: Home Health Living arrangements for the past 2 months: Single Family Home                 DME Arranged: N/A DME Agency: NA       HH Arranged: Refused HH          Prior Living Arrangements/Services Living arrangements for the past 2 months: Single Family Home Lives with:: Spouse, Self Patient language and need for interpreter reviewed:: Yes        Need for Family Participation in Patient Care: Yes (Comment) Care giver support system in place?: Yes (comment)   Criminal Activity/Legal Involvement Pertinent to Current Situation/Hospitalization: No - Comment as needed  Activities of Daily Living      Permission Sought/Granted                  Emotional Assessment              Admission diagnosis:  Baker cyst, right [M71.21] Pain and swelling of right  knee [M25.561, M25.461] Arthralgia, unspecified joint [M25.50] Patient Active Problem List   Diagnosis Date Noted   Baker cyst, right 03/02/2022   Altered mental status 10/05/2020   Constipation 05/07/2016   Protein-calorie malnutrition ([place]) 05/07/2016   Hyperglycemia 01/30/2016   Normocytic anemia 01/30/2016   Atherosclerotic peripheral vascular disease ([place]) 01/30/2016   Degenerative joint disease of spine 01/30/2016   BPH (benign prostatic hyperplasia) 01/30/2016   MRSA bacteremia    Thoracic discitis    Rheumatoid arthritis involving multiple sites with positive rheumatoid factor ([place]) 01/29/2016   Essential hypertension 01/29/2016   Left adrenal mass ([place]) 01/29/2016   PCP:  Lajean Manes, MD Pharmacy:   [facility], [place] - [address] [place] [address] Phone: [phone] Fax: [phone]     Social Determinants of Health (SDOH) Interventions    Readmission Risk Interventions     No data to display

## 2022-03-04 DIAGNOSIS — M25561 Pain in right knee: Secondary | ICD-10-CM | POA: Diagnosis not present

## 2022-03-04 DIAGNOSIS — M7121 Synovial cyst of popliteal space [Baker], right knee: Secondary | ICD-10-CM | POA: Diagnosis not present

## 2022-03-04 DIAGNOSIS — M25461 Effusion, right knee: Secondary | ICD-10-CM

## 2022-03-04 NOTE — Progress Notes (Signed)
PROGRESS NOTE    Tanner Rice  CVE:[id number] DOB: 1934-05-10 DOA: 03/02/2022 PCP: Lajean Manes, MD   Brief Narrative:  This 88 year old male with PMH significant for RA on DMARDs, HTN, BPH, Baker's cyst, prior MRSA discitis on chronic suppressive antibiotics, recent right groin hernia repair 2 weeks ago, comes to the hospital with right knee swelling and pain.  Orthopedic surgery was consulted in the ER and underwent fluid aspiration and steroid injection.  He is admitted for further evaluation.  Assessment & Plan:   Principal Problem:   Baker cyst, right Active Problems:   Knee osteoarthritis  Right knee osteoarthritis: Most likely diagnosis is right knee osteoarthritis Joint fluid analysis was negative for crystals, but it showed 16,800 WBC with 89% neutrophils, somewhat borderline for septic arthritis.  He does have a history of MRSA bacteremia and thoracic discitis on suppressive antibiotics.  ID consulted, appreciated input.  Recommended to restart Doxycycline 100 mg twice daily for MRSA discitis chronic suppression. Gram stain negative, monitor cultures. If no growth ~48h, can probably start prednisone for his RA (see below)   Left knee pain, left shoulder pain: No swelling noted, no warmth, but concerning.   Monitor overnight to ensure does not develop swelling/redness.   Plain x-rays without significant acute findings   History of MRSA bacteremia as well as thoracic discitis in 2017: Continue suppressive antibiotics with doxycycline.   Rheumatoid arthritis: Patient gets Simponi for rheumatoid arthritis.  He has not received his recent dose due to hernia repair. There is a possibility that his current episode is related to RA. He currently is mobile but wife is very concerned about him going up and down the stairs (2 level home), also getting in a car. He will need to travel to [facility] for his Simponi hopefully soon. If infection is ruled out can probably use a short  course of Prednisone to help him regain some mobility.  Essential hypertension: Continue lisinopril and amlodipine.   Leukocytosis: Likely reactive which is improving now.   DVT prophylaxis: Lovenox Code Status: Full code Family Communication: No family at bedside Disposition Plan:  Status is: Inpatient Remains inpatient appropriate because: Admitted for rheumatoid arthritis flare.  Orthopedics is consulted.   In dissipated discharge home in 1 to 2 days  Consultants:  Orthopedics, infectious diseases.  Procedures: Arthrocentesis Antimicrobials:  Anti-infectives (From admission, onward)    Start     Dose/Rate Route Frequency Ordered Stop   03/03/22 1415  doxycycline (VIBRA-TABS) tablet 100 mg        100 mg Oral Every 12 hours 03/03/22 1316        Subjective: Patient was seen and examined at bedside.  Overnight events noted.   Patient reports pain in the right knee is improved but now he has developed pain in the left knee.  Objective: Vitals:   03/03/22 1702 03/03/22 2130 03/04/22 0648 03/04/22 0742  BP: 127/71 124/80 126/67 137/78  Pulse: 73 60 (!) 57 62  Resp: '15 16  16  '$ Temp: 98.7 F (37.1 C) 98.5 F (36.9 C) 98.3 F (36.8 C) 98 F (36.7 C)  TempSrc: Oral Oral Oral Oral  SpO2: 95% 97% 96% 98%  Weight:      Height:        Intake/Output Summary (Last 24 hours) at 03/04/2022 1422 Last data filed at 03/04/2022 1107 Gross per 24 hour  Intake --  Output 525 ml  Net -525 ml   Filed Weights   03/02/22 1153  Weight: 68  kg    Examination:  General exam: Appears comfortable, not in any acute distress.  Deconditioned Respiratory system: CTA bilaterally, no wheezing, no crackles, normal respiratory effort. Cardiovascular system: S1 & S2 heard, regular rate and rhythm, no murmur. Gastrointestinal system: Abdomen is soft, non tender, non distended, BS+ Central nervous system: Alert and oriented x 3. No focal neurological deficits. Extremities: Right knee  tenderness+, no swelling of left knee. Skin: No rashes, lesions or ulcers Psychiatry: Judgement and insight appear normal. Mood & affect appropriate.     Data Reviewed: I have personally reviewed following labs and imaging studies  CBC: Recent Labs  Lab 03/02/22 1305 03/03/22 0103  WBC 16.1* 10.8*  NEUTROABS 12.8*  --   HGB 12.9* 10.9*  HCT 36.8* 32.6*  MCV 96.1 97.3  PLT 183 283   Basic Metabolic Panel: Recent Labs  Lab 03/02/22 1305 03/03/22 0103  NA 133* 133*  K 3.8 3.8  CL 102 99  CO2 23 25  GLUCOSE 103* 100*  BUN 17 23  CREATININE 0.88 0.97  CALCIUM 9.8 9.7  MG 1.9  --    GFR: Estimated Creatinine Clearance: 49.3 mL/min (by C-G formula based on SCr of 0.97 mg/dL). Liver Function Tests: Recent Labs  Lab 03/02/22 1305  AST 21  ALT 15  ALKPHOS 62  BILITOT 1.1  PROT 6.3*  ALBUMIN 3.5   No results for input(s): "LIPASE", "AMYLASE" in the last 168 hours. No results for input(s): "AMMONIA" in the last 168 hours. Coagulation Profile: No results for input(s): "INR", "PROTIME" in the last 168 hours. Cardiac Enzymes: No results for input(s): "CKTOTAL", "CKMB", "CKMBINDEX", "TROPONINI" in the last 168 hours. BNP (last 3 results) No results for input(s): "PROBNP" in the last 8760 hours. HbA1C: No results for input(s): "HGBA1C" in the last 72 hours. CBG: No results for input(s): "GLUCAP" in the last 168 hours. Lipid Profile: No results for input(s): "CHOL", "HDL", "LDLCALC", "TRIG", "CHOLHDL", "LDLDIRECT" in the last 72 hours. Thyroid Function Tests: No results for input(s): "TSH", "T4TOTAL", "FREET4", "T3FREE", "THYROIDAB" in the last 72 hours. Anemia Panel: No results for input(s): "VITAMINB12", "FOLATE", "FERRITIN", "TIBC", "IRON", "RETICCTPCT" in the last 72 hours. Sepsis Labs: No results for input(s): "PROCALCITON", "LATICACIDVEN" in the last 168 hours.  Recent Results (from the past 240 hour(s))  Body fluid culture w Gram Stain     Status: None  (Preliminary result)   Collection Time: 03/02/22  4:28 PM   Specimen: Synovium; Synovial Fluid  Result Value Ref Range Status   Specimen Description SYNOVIAL RIGHT KNEE  Final   Special Requests NONE  Final   Gram Stain   Final    FEW WBC PRESENT, PREDOMINANTLY PMN NO ORGANISMS SEEN    Culture   Final    NO GROWTH < 24 HOURS Performed at [facility], [address], [place], [place] [address]    Report Status PENDING  Incomplete    Radiology Studies: DG Shoulder Left Port  Result Date: 03/03/2022 CLINICAL DATA:  Left shoulder and knee pain EXAM: LEFT SHOULDER three views COMPARISON:  None Available. FINDINGS: No evidence of fracture or malalignment. The humeral head is located. Degenerative osteoarthritis present at the acromioclavicular joint with a downward directed osteophyte arising from the acromion process. Remote healed left rib fracture. No lytic or blastic osseous lesion. IMPRESSION: 1. Degenerative osteoarthritis at the left glenohumeral and acromioclavicular joints. 2. No acute fracture or malalignment. Electronically Signed   By: Jacqulynn Cadet M.D.   On: 03/03/2022 11:04  DG Knee Complete 4 Views Left  Result Date: 03/03/2022 CLINICAL DATA:  Left knee pain beginning yesterday. No known injury. EXAM: LEFT KNEE - COMPLETE 4+ VIEW COMPARISON:  None Available. FINDINGS: No evidence of fracture, dislocation, or joint effusion. Mild chondrocalcinosis is noted without other signs of arthropathy. No focal bone lesions identified. Peripheral vascular calcification noted. IMPRESSION: No acute findings. Mild chondrocalcinosis. Electronically Signed   By: Marlaine Hind M.D.   On: 03/03/2022 11:03   DG Knee Right Port  Result Date: 03/02/2022 CLINICAL DATA:  Generalized pain, no reported injury EXAM: PORTABLE RIGHT KNEE - 1-2 VIEW COMPARISON:  None Available. FINDINGS: No fracture or dislocation. Apparent moderate suprapatellar right knee joint effusion on the limited slightly  obliqued cross-table lateral view. Mild tricompartmental right knee osteoarthritis with meniscal chondrocalcinosis. No suspicious focal osseous lesions. No radiopaque foreign bodies. IMPRESSION: Apparent moderate suprapatellar right knee joint effusion. No fracture or malalignment. Mild tricompartmental right knee osteoarthritis with meniscal chondrocalcinosis, suggesting CPPD arthropathy. Electronically Signed   By: Ilona Sorrel M.D.   On: 03/02/2022 15:47    Scheduled Meds:  amLODipine  5 mg Oral Daily   calcium-vitamin D   Oral Daily   doxycycline  100 mg Oral Q12H   enoxaparin (LOVENOX) injection  40 mg Subcutaneous Q24H   lisinopril  20 mg Oral Daily   melatonin  5 mg Oral QHS   metoprolol succinate  25 mg Oral QPM   Continuous Infusions:   LOS: 1 day    Time spent: 50 mins    Mateya Torti, MD [facility]   If 7PM-7AM, please contact night-coverage

## 2022-03-04 NOTE — Progress Notes (Signed)
Mobility Specialist - Progress Note   03/04/22 1630  Mobility  Activity Ambulated with assistance in hallway  Level of Assistance Standby assist, set-up cues, supervision of patient - no hands on  Assistive Device Front wheel walker  Distance Ambulated (ft) 200 ft  Activity Response Tolerated well  $Mobility charge 1 Mobility    Pt received in bed agreeable to mobility. No complaints throughout. Left in bed w/ call bell in reach and all needs met.   Paulla Dolly Mobility Specialist

## 2022-03-04 NOTE — Progress Notes (Signed)
[facility]  Date of Admission:  03/02/2022           Reason for visit: Follow up on possible knee infection  Current antibiotics: Doxycycline  ASSESSMENT:    88 y.o. male admitted with:  Right knee effusion Aspiration borderline for septic arthritis with only 16k (89% PMN) and more likely due to CPPD vs RA flare.  Cultures remain NGTD.   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  Left knee and left shoulder pain No swelling, warmth, or erythema noted.  Plain films unremarkable.  Rheumatoid arthritis Receives Simponi outpatient but did not receive recent dose due to hernia repair.  ? Whether current presentation related to flare.  RECOMMENDATIONS:    Continue doxycycline for chronic suppression Monitor cultures.  If they remain negative, would consider short course prednisone for his RA Will sign off for now, please call if further questions/concerns   Principal Problem:   Baker cyst, right Active Problems:   Knee osteoarthritis    MEDICATIONS:    Scheduled Meds:  amLODipine  5 mg Oral Daily   calcium-vitamin D   Oral Daily   doxycycline  100 mg Oral Q12H   enoxaparin (LOVENOX) injection  40 mg Subcutaneous Q24H   lisinopril  20 mg Oral Daily   melatonin  5 mg Oral QHS   metoprolol succinate  25 mg Oral QPM   Continuous Infusions: PRN Meds:.acetaminophen, bisacodyl, diclofenac Sodium, HYDROmorphone (DILAUDID) injection, ondansetron **OR** ondansetron (ZOFRAN) IV, senna-docusate, senna-docusate, traMADol  SUBJECTIVE:   No acute events Reports left knee and shoulder pain Right knee symptoms improved No other complaints  Review of Systems  All other systems reviewed and are negative.     OBJECTIVE:   Blood pressure 137/78, pulse 62, temperature 98 F (36.7 C), temperature source Oral, resp. rate 16, height '5\' 6"'$  (1.676 m), weight 68 kg, SpO2 98 %. Body mass index is 24.21 kg/m.  Physical  Exam Constitutional:      Appearance: Normal appearance.  HENT:     Head: Normocephalic and atraumatic.  Pulmonary:     Effort: Pulmonary effort is normal. No respiratory distress.  Abdominal:     General: There is no distension.     Palpations: Abdomen is soft.  Musculoskeletal:     Cervical back: Normal range of motion and neck supple.     Comments: Left knee with pain elicited on ROM.  No tenderness, added warmth, or erythema.   Skin:    General: Skin is warm and dry.     Findings: No erythema.  Neurological:     General: No focal deficit present.     Mental Status: He is alert. Mental status is at baseline.  Psychiatric:        Mood and Affect: Mood normal.        Behavior: Behavior normal.        Thought Content: Thought content normal.      Lab Results: Lab Results  Component Value Date   WBC 10.8 (H) 03/03/2022   HGB 10.9 (L) 03/03/2022   HCT 32.6 (L) 03/03/2022   MCV 97.3 03/03/2022   PLT 192 03/03/2022    Lab Results  Component Value Date   NA 133 (L) 03/03/2022   K 3.8 03/03/2022   CO2 25 03/03/2022   GLUCOSE 100 (H) 03/03/2022   BUN 23 03/03/2022   CREATININE 0.97 03/03/2022   CALCIUM 9.7 03/03/2022   GFRNONAA >60 03/03/2022  GFRAA >60 02/03/2019    Lab Results  Component Value Date   ALT 15 03/02/2022   AST 21 03/02/2022   ALKPHOS 62 03/02/2022   BILITOT 1.1 03/02/2022       Component Value Date/Time   CRP 19.3 (H) 03/03/2022 1642       Component Value Date/Time   ESRSEDRATE 64 (H) 03/03/2022 1642     I have reviewed the micro and lab results in Epic.  Imaging: DG Shoulder Left Port  Result Date: 03/03/2022 CLINICAL DATA:  Left shoulder and knee pain EXAM: LEFT SHOULDER three views COMPARISON:  None Available. FINDINGS: No evidence of fracture or malalignment. The humeral head is located. Degenerative osteoarthritis present at the acromioclavicular joint with a downward directed osteophyte arising from the acromion process. Remote  healed left rib fracture. No lytic or blastic osseous lesion. IMPRESSION: 1. Degenerative osteoarthritis at the left glenohumeral and acromioclavicular joints. 2. No acute fracture or malalignment. Electronically Signed   By: Jacqulynn Cadet M.D.   On: 03/03/2022 11:04   DG Knee Complete 4 Views Left  Result Date: 03/03/2022 CLINICAL DATA:  Left knee pain beginning yesterday. No known injury. EXAM: LEFT KNEE - COMPLETE 4+ VIEW COMPARISON:  None Available. FINDINGS: No evidence of fracture, dislocation, or joint effusion. Mild chondrocalcinosis is noted without other signs of arthropathy. No focal bone lesions identified. Peripheral vascular calcification noted. IMPRESSION: No acute findings. Mild chondrocalcinosis. Electronically Signed   By: Marlaine Hind M.D.   On: 03/03/2022 11:03   DG Knee Right Port  Result Date: 03/02/2022 CLINICAL DATA:  Generalized pain, no reported injury EXAM: PORTABLE RIGHT KNEE - 1-2 VIEW COMPARISON:  None Available. FINDINGS: No fracture or dislocation. Apparent moderate suprapatellar right knee joint effusion on the limited slightly obliqued cross-table lateral view. Mild tricompartmental right knee osteoarthritis with meniscal chondrocalcinosis. No suspicious focal osseous lesions. No radiopaque foreign bodies. IMPRESSION: Apparent moderate suprapatellar right knee joint effusion. No fracture or malalignment. Mild tricompartmental right knee osteoarthritis with meniscal chondrocalcinosis, suggesting CPPD arthropathy. Electronically Signed   By: Ilona Sorrel M.D.   On: 03/02/2022 15:47     Imaging  independently reviewed in Epic.    Tanner Rice for Infectious [facility] [phone] pager 03/04/2022, 3:00 PM  I have personally spent 50 minutes involved in face-to-face and non-face-to-face activities for this patient on the day of the visit. Professional time spent includes the following activities: Preparing to see the  patient (review of tests), Obtaining and/or reviewing separately obtained history (admission/discharge record), Performing a medically appropriate examination and/or evaluation , Ordering medications/tests/procedures, referring and communicating with other health care professionals, Documenting clinical information in the EMR, Independently interpreting results (not separately reported), Communicating results to the patient/family/caregiver, Counseling and educating the patient/family/caregiver and Care coordination (not separately reported).

## 2022-03-04 NOTE — Evaluation (Addendum)
Occupational Therapy Evaluation Patient Details Name: Tanner Rice MRN: [id number] DOB: [birth year] Today's Date: 03/04/2022   History of Present Illness Pt is a 88 y.o.M who presents 03/02/2022 with suddent onset of right knee pain and ambulation dysfunction now s/p knee aspiration. Significant PMH: RA, HTN, BPH, remote history of Baker's cyst.   Clinical Impression   Pt admitted as above presenting with deficits as listed below. Pt seen for OT eval followed by ADL retraining session with focus on ADL's standing at sink, functional mobility (bed mobility, transfers into bathroom - on/off commode over toilet and functional mobility in room & hallway). Pt is currently Min guard assist for LB ADL and functional mobility/transfers related to ADL's secondary to bilateral knee pain. He should benefit from acute OT to assist in maximizing independence with ADL's and activity tolerance for anticipated d/c home with PRN assist from spouse. After discussion of pt declining HHPT, pt appears agreeable to HHPT to assist with balance and mobility. He stated that he thought HHPT was focused on "cooking" per his report and "I don't want to do that".     Recommendations for follow up therapy are one component of a multi-disciplinary discharge planning process, led by the attending physician.  Recommendations may be updated based on patient status, additional functional criteria and insurance authorization.   Follow Up Recommendations  No OT follow up    Assistance Recommended at Discharge PRN  Patient can return home with the following A little help with walking and/or transfers;A little help with bathing/dressing/bathroom;Assistance with cooking/housework;Help with stairs or ramp for entrance    Functional Status Assessment  Patient has had a recent decline in their functional status and demonstrates the ability to make significant improvements in function in a reasonable and predictable amount of time.   Equipment Recommendations  BSC/3in1    Recommendations for Other Services       Precautions / Restrictions Precautions Precautions: Fall Restrictions Weight Bearing Restrictions: No      Mobility Bed Mobility Overal bed mobility: Needs Assistance Bed Mobility: Supine to Sit, Sit to Supine     Supine to sit: Min guard, HOB elevated Sit to supine: Modified independent (Device/Increase time), HOB elevated   General bed mobility comments: Min A with trunk to come into sitting up at EOB. Mod I getting back to bed after ambulation and ADL's    Transfers Overall transfer level: Needs assistance Equipment used: Rolling walker (2 wheels) Transfers: Sit to/from Stand, Bed to chair/wheelchair/BSC Sit to Stand: Min guard Stand pivot transfers: Min guard     General transfer comment: Increased time and occasional vc's for RW safety and sequencing      Balance Overall balance assessment: Needs assistance Sitting-balance support: Feet supported, No upper extremity supported Sitting balance-Leahy Scale: Good   Standing balance support: Bilateral upper extremity supported, During functional activity Standing balance-Leahy Scale: Fair     ADL either performed or assessed with clinical judgement   ADL Overall ADL's : Needs assistance/impaired Eating/Feeding: Set up;Bed level;Sitting   Grooming: Wash/dry hands;Wash/dry face;Oral care;Min guard;Standing Grooming Details (indicate cue type and reason): Standing at sink Upper Body Bathing: Set up;Sitting   Lower Body Bathing: Sitting/lateral leans;Sit to/from stand;Min guard   Upper Body Dressing : Set up;Sitting   Lower Body Dressing: Sitting/lateral leans;Sit to/from stand;Min guard;Supervision/safety   Toilet Transfer: Supervision/safety;Min guard;Ambulation;Rolling walker (2 wheels);Grab bars;BSC/3in1 Toilet Transfer Details (indicate cue type and reason): 3:1 over toilet in bathroom Toileting- Clothing Manipulation and  Hygiene: Min guard;Sit to/from stand;Sitting/lateral  lean   Functional mobility during ADLs: Min guard;Rolling walker (2 wheels) General ADL Comments: Pt seen for OT eval followed by pt education re: role of OT and recommendations. Pt also participated in ADL retraining session with focus on ADL's standing at sink, functional mobility (bed mobility, transfers into bathroom - on/off commode over toilet and functional mobility in room, hallway). After discussion, pt appears agreeable to HHPT to assist with balance and mobility. He stated that he thought HHPT that he had before was focused on "cooking" per his report and "I don't want to do that". Will follow acutely to assist in maximizing independence with ADL's and self care tasks prior to anticipated d/c home with PRN A from spouse.     Vision Baseline Vision/History: 1 Wears glasses Patient Visual Report: No change from baseline              Pertinent Vitals/Pain Pain Assessment Pain Assessment: Faces Faces Pain Scale: Hurts little more Pain Location: L shoulder and knee Pain Descriptors / Indicators: Discomfort, Grimacing, Guarding Pain Intervention(s): Monitored during session, Premedicated before session, Repositioned     Hand Dominance Right   Extremity/Trunk Assessment Upper Extremity Assessment LUE Deficits / Details: Shoulder flexion AROM to ~80 degrees, passively [facility]   Lower Extremity Assessment Lower Extremity Assessment: Defer to PT evaluation   Cervical / Trunk Assessment Cervical / Trunk Assessment: Kyphotic   Communication Communication Communication: No difficulties   Cognition Arousal/Alertness: Awake/alert Behavior During Therapy: WFL for tasks assessed/performed Overall Cognitive Status: Within Functional Limits for tasks assessed                Home Living Family/patient expects to be discharged to:: Private residence Living Arrangements: Spouse/significant other Available Help at Discharge:  Family Type of Home: House Home Access: Stairs to enter Technical brewer of Steps: 4 Entrance Stairs-Rails: Right;Left Home Layout: Two level Alternate Level Stairs-Number of Steps: flight Alternate Level Stairs-Rails: Left Bathroom Shower/Tub: Walk-in shower;Tub/shower unit   Bathroom Toilet: Standard     Home Equipment: Cane - single point      Prior Functioning/Environment Prior Level of Function : Needs assist;Driving   Mobility Comments: uses cane ADLs Comments: independent        OT Problem List: Decreased knowledge of use of DME or AE;Decreased knowledge of precautions;Decreased activity tolerance;Impaired balance (sitting and/or standing);Pain      OT Treatment/Interventions: Self-care/ADL training;Patient/family education;Energy conservation;DME and/or AE instruction;Therapeutic activities    OT Goals(Current goals can be found in the care plan section) Acute Rehab OT Goals Patient Stated Goal: Go home when able, decreased pain in knees OT Goal Formulation: With patient Time For Goal Achievement: 03/18/22 Potential to Achieve Goals: Good  OT Frequency: Min 2X/week       AM-PAC OT "6 Clicks" Daily Activity     Outcome Measure Help from another person eating meals?: None Help from another person taking care of personal grooming?: None Help from another person toileting, which includes using toliet, bedpan, or urinal?: A Little Help from another person bathing (including washing, rinsing, drying)?: A Little Help from another person to put on and taking off regular upper body clothing?: None Help from another person to put on and taking off regular lower body clothing?: A Little 6 Click Score: 21   End of Session Equipment Utilized During Treatment: Rolling walker (2 wheels) Nurse Communication: Mobility status  Activity Tolerance: Patient tolerated treatment well Patient left: in bed;with call bell/phone within reach;with bed alarm set  OT Visit  Diagnosis:  Other abnormalities of gait and mobility (R26.89);Pain Pain - Right/Left:  (Bilateral knees, left shoulder) Pain - part of body:  (bilateral knees and L shoulder)                Time: 3016-0109 OT Time Calculation (min): 37 min Charges:  OT General Charges $OT Visit: 1 Visit OT Evaluation $OT Eval Low Complexity: 1 Low  Gerrit Rafalski Beth Dixon, OTR/L 03/04/2022, 9:04 AM

## 2022-03-04 NOTE — Progress Notes (Signed)
Physical Therapy Treatment Patient Details Name: KELLER BOUNDS MRN: [id number] DOB: January 05, 1934 Today's Date: 03/04/2022   History of Present Illness Pt is a 88 y.o.M who presents 03/02/2022 with suddent onset of right knee pain and ambulation dysfunction now s/p knee aspiration. Significant PMH: RA, HTN, BPH, remote history of Baker's cyst.    PT Comments    Pt progressing well towards his physical therapy goals; reports mildly improved left shoulder and knee pain. Pt ambulating 250 ft with a walker at a supervision level. Negotiated 6 steps with a railing, utilizing a sideways technique for bilateral hand support. Still feels he is not at his baseline, but politely declining HHPT. Goal is to go to the beach next week. Will continue to follow acutely.    Recommendations for follow up therapy are one component of a multi-disciplinary discharge planning process, led by the attending physician.  Recommendations may be updated based on patient status, additional functional criteria and insurance authorization.  Follow Up Recommendations  No PT follow up     Assistance Recommended at Discharge PRN  Patient can return home with the following Assistance with cooking/housework;Assist for transportation;Help with stairs or ramp for entrance   Equipment Recommendations  Rolling walker (2 wheels)    Recommendations for Other Services       Precautions / Restrictions Precautions Precautions: Fall Restrictions Weight Bearing Restrictions: No     Mobility  Bed Mobility Overal bed mobility: Modified Independent                  Transfers Overall transfer level: Needs assistance Equipment used: Rolling walker (2 wheels) Transfers: Sit to/from Stand Sit to Stand: Supervision                Ambulation/Gait Ambulation/Gait assistance: Supervision Gait Distance (Feet): 250 Feet Assistive device: Rolling walker (2 wheels) Gait Pattern/deviations: Step-to pattern, Decreased  stride length, Step-through pattern       General Gait Details: Improved foot clearance and gait speed today   Stairs Stairs: Yes Stairs assistance: Min guard Stair Management: One rail Left, Sideways Number of Stairs: 6 General stair comments: Cues for sequencing/technique. step by step   Wheelchair Mobility    Modified Rankin (Stroke Patients Only)       Balance Overall balance assessment: Needs assistance Sitting-balance support: Feet supported, No upper extremity supported Sitting balance-Leahy Scale: Good     Standing balance support: Bilateral upper extremity supported, During functional activity Standing balance-Leahy Scale: Fair                              Cognition Arousal/Alertness: Awake/alert Behavior During Therapy: WFL for tasks assessed/performed Overall Cognitive Status: Within Functional Limits for tasks assessed                                          Exercises      General Comments        Pertinent Vitals/Pain Pain Assessment Pain Assessment: Faces Faces Pain Scale: Hurts a little bit Pain Location: L shoulder with AROM and L knee Pain Descriptors / Indicators: Discomfort, Grimacing, Guarding Pain Intervention(s): Monitored during session    Home Living Family/patient expects to be discharged to:: Private residence Living Arrangements: Spouse/significant other Available Help at Discharge: Family Type of Home: House Home Access: Stairs to enter Entrance Stairs-Rails: Psychiatric nurse of  Steps: 4 Alternate Level Stairs-Number of Steps: flight Home Layout: Two level Home Equipment: Cane - single point      Prior Function            PT Goals (current goals can now be found in the care plan section) Acute Rehab PT Goals Patient Stated Goal: back to baseline Potential to Achieve Goals: Good Progress towards PT goals: Progressing toward goals    Frequency    Min 3X/week       PT Plan Discharge plan needs to be updated    Co-evaluation              AM-PAC PT "6 Clicks" Mobility   Outcome Measure  Help needed turning from your back to your side while in a flat bed without using bedrails?: None Help needed moving from lying on your back to sitting on the side of a flat bed without using bedrails?: None Help needed moving to and from a bed to a chair (including a wheelchair)?: A Little Help needed standing up from a chair using your arms (e.g., wheelchair or bedside chair)?: A Little Help needed to walk in hospital room?: A Little Help needed climbing 3-5 steps with a railing? : A Little 6 Click Score: 20    End of Session   Activity Tolerance: Patient tolerated treatment well Patient left: in bed;with call bell/phone within reach;with bed alarm set Nurse Communication: Mobility status PT Visit Diagnosis: Unsteadiness on feet (R26.81);Difficulty in walking, not elsewhere classified (R26.2);Pain Pain - Right/Left: Left Pain - part of body: Knee;Shoulder     Time: 7017-7939 PT Time Calculation (min) (ACUTE ONLY): 24 min  Charges:  $Gait Training: 8-22 mins $Therapeutic Activity: 8-22 mins                     Wyona Almas, PT, DPT Acute Rehabilitation Services Office [place] 03/04/2022, 11:19 AM

## 2022-03-05 DIAGNOSIS — M7121 Synovial cyst of popliteal space [Baker], right knee: Secondary | ICD-10-CM | POA: Diagnosis not present

## 2022-03-05 LAB — BASIC METABOLIC PANEL
Anion gap: 9 (ref 5–15)
BUN: 23 mg/dL (ref 8–23)
CO2: 26 mmol/L (ref 22–32)
Calcium: 9.7 mg/dL (ref 8.9–10.3)
Chloride: 97 mmol/L — ABNORMAL LOW (ref 98–111)
Creatinine, Ser: 0.82 mg/dL (ref 0.61–1.24)
GFR, Estimated: >60 mL/min (ref >60)
Glucose, Bld: 131 mg/dL — ABNORMAL HIGH (ref 70–99)
Potassium: 4.2 mmol/L (ref 3.5–5.1)
Sodium: 132 mmol/L — ABNORMAL LOW (ref 135–145)

## 2022-03-05 LAB — CBC
HCT: 32.4 % — ABNORMAL LOW (ref 39.0–52.0)
Hemoglobin: 11.3 g/dL — ABNORMAL LOW (ref 13.0–17.0)
MCH: 32.8 pg (ref 26.0–34.0)
MCHC: 34.9 g/dL (ref 30.0–36.0)
MCV: 94.2 fL (ref 80.0–100.0)
Platelets: 232 10*3/uL (ref 150–400)
RBC: 3.44 MIL/uL — ABNORMAL LOW (ref 4.22–5.81)
RDW: 11.8 % (ref 11.5–15.5)
WBC: 10.5 10*3/uL (ref 4.0–10.5)
nRBC: 0 % (ref 0.0–0.2)

## 2022-03-05 LAB — PHOSPHORUS: Phosphorus: 3 mg/dL (ref 2.5–4.6)

## 2022-03-05 LAB — MAGNESIUM: Magnesium: 1.7 mg/dL (ref 1.7–2.4)

## 2022-03-05 MED ORDER — OXYCODONE-ACETAMINOPHEN 5-325 MG PO TABS
1.0000 | ORAL_TABLET | Freq: Four times a day (QID) | ORAL | Status: DC | PRN
  Administered 2022-03-05 – 2022-03-06 (×2): 1 via ORAL
  Filled 2022-03-05 (×2): qty 1

## 2022-03-05 NOTE — Progress Notes (Addendum)
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  Orthopedic surgery was consulted in the ER and underwent fluid aspiration and steroid injection.  He is admitted for further evaluation.  Assessment & Plan:   Principal Problem:   Baker cyst, right Active Problems:   Knee osteoarthritis   Pain and swelling of right knee  Right knee osteoarthritis: Most likely diagnosis is right knee osteoarthritis Joint fluid analysis was negative for crystals, but it showed 16,800 WBC with 89% neutrophils, somewhat borderline for septic arthritis.  He does have a history of MRSA bacteremia and thoracic discitis on suppressive antibiotics.  ID consulted, appreciated input.  Recommended to continue Doxycycline 100 mg twice daily for MRSA discitis chronic suppression. Gram stain negative, monitor cultures. If no growth, can probably start prednisone for his RA.   Left knee pain, left shoulder pain: No swelling noted, no warmth, but concerning.   Monitor overnight to ensure does not develop swelling/redness.   Plain x-rays without significant acute findings. He reports pain is improving   History of MRSA bacteremia as well as thoracic discitis in 2017: Continue suppressive antibiotics with doxycycline.   Rheumatoid arthritis: Patient gets Simponi for rheumatoid arthritis.  He has not received his recent dose due to hernia repair. There is a possibility that his current episode is related to RA. He currently is mobile but wife is very concerned about him going up and down the stairs (2 level home), also getting in a car. He will need to travel to [facility] for his Simponi hopefully soon.  If infection is ruled out can probably use a short course of Prednisone to help him regain some mobility.  Essential hypertension: Continue lisinopril and amlodipine.   Leukocytosis: Likely reactive which is improving now.   DVT prophylaxis: Lovenox Code Status: Full code Family Communication: No family at bedside Disposition Plan:  Status is: Inpatient Remains inpatient appropriate because: Admitted for rheumatoid arthritis flare.  Orthopedics is consulted.ID is consulted.   In dissipated discharge home in 1 to 2 days  Consultants:  Orthopedics, infectious diseases.  Procedures: Arthrocentesis Antimicrobials:  Anti-infectives (From admission, onward)    Start     Dose/Rate Route Frequency Ordered Stop   03/03/22 1415  doxycycline (VIBRA-TABS) tablet 100 mg        100 mg Oral Every 12 hours 03/03/22 1316        Subjective: Patient was seen and examined at bedside.  Overnight events noted.   Patient reports pain in the right knee is improved, pain in the left knee also improving.  Objective: Vitals:   03/04/22 1548 03/04/22 1939 03/05/22 0320 03/05/22 0758  BP: (!) 160/81 (!) 186/89 (!) 195/84 128/66  Pulse: 66 77 73 65  Resp: '16 17 17 17  '$ Temp: 98.4 F (36.9 C) 98.2 F (36.8 C) 98.6 F (37 C) 98.7 F (37.1 C)  TempSrc: Oral Oral Oral Oral  SpO2: 100% 98% 99% 94%  Weight:      Height:        Intake/Output Summary (Last 24 hours) at 03/05/2022 1300 Last data filed at 03/05/2022 0802 Gross per 24 hour  Intake --  Output 900 ml  Net -900 ml  Filed Weights   03/02/22 1153  Weight: 68 kg    Examination:  General exam: Appears comfortable, not in any acute distress.  Deconditioned Respiratory system: CTA bilaterally, no wheezing, no crackles, normal respiratory effort. Cardiovascular system: S1 & S2 heard, regular rate and rhythm, no murmur. Gastrointestinal system: Abdomen is soft, non tender, non distended, BS+ Central nervous system: Alert and oriented  x 3. No focal neurological deficits. Extremities: Right knee tenderness+ no swelling left knee noted. Skin: No rashes, lesions or ulcers Psychiatry: Judgement and insight appear normal. Mood & affect appropriate.     Data Reviewed: I have personally reviewed following labs and imaging studies  CBC: Recent Labs  Lab 03/02/22 1305 03/03/22 0103 03/05/22 0104  WBC 16.1* 10.8* 10.5  NEUTROABS 12.8*  --   --   HGB 12.9* 10.9* 11.3*  HCT 36.8* 32.6* 32.4*  MCV 96.1 97.3 94.2  PLT 183 192 244   Basic Metabolic Panel: Recent Labs  Lab 03/02/22 1305 03/03/22 0103 03/05/22 0104  NA 133* 133* 132*  K 3.8 3.8 4.2  CL 102 99 97*  CO2 '23 25 26  '$ GLUCOSE 103* 100* 131*  BUN '17 23 23  '$ CREATININE 0.88 0.97 0.82  CALCIUM 9.8 9.7 9.7  MG 1.9  --  1.7  PHOS  --   --  3.0   GFR: Estimated Creatinine Clearance: 58.4 mL/min (by C-G formula based on SCr of 0.82 mg/dL). Liver Function Tests: Recent Labs  Lab 03/02/22 1305  AST 21  ALT 15  ALKPHOS 62  BILITOT 1.1  PROT 6.3*  ALBUMIN 3.5   No results for input(s): "LIPASE", "AMYLASE" in the last 168 hours. No results for input(s): "AMMONIA" in the last 168 hours. Coagulation Profile: No results for input(s): "INR", "PROTIME" in the last 168 hours. Cardiac Enzymes: No results for input(s): "CKTOTAL", "CKMB", "CKMBINDEX", "TROPONINI" in the last 168 hours. BNP (last 3 results) No results for input(s): "PROBNP" in the last 8760 hours. HbA1C: No results for input(s): "HGBA1C" in the last 72 hours. CBG: No results for input(s): "GLUCAP" in the last 168 hours. Lipid Profile: No results for input(s): "CHOL", "HDL", "LDLCALC", "TRIG", "CHOLHDL", "LDLDIRECT" in the last 72 hours. Thyroid Function Tests: No results for input(s): "TSH", "T4TOTAL", "FREET4", "T3FREE", "THYROIDAB" in the last 72 hours. Anemia Panel: No results for input(s): "VITAMINB12", "FOLATE", "FERRITIN", "TIBC", "IRON", "RETICCTPCT" in the last 72 hours. Sepsis  Labs: No results for input(s): "PROCALCITON", "LATICACIDVEN" in the last 168 hours.  Recent Results (from the past 240 hour(s))  Body fluid culture w Gram Stain     Status: None (Preliminary result)   Collection Time: 03/02/22  4:28 PM   Specimen: Synovium; Synovial Fluid  Result Value Ref Range Status   Specimen Description SYNOVIAL RIGHT KNEE  Final   Special Requests NONE  Final   Gram Stain   Final    FEW WBC PRESENT, PREDOMINANTLY PMN NO ORGANISMS SEEN    Culture   Final    NO GROWTH 2 DAYS Performed at [facility], [address] [place], [place] [address]    Report Status PENDING  Incomplete    Radiology Studies: No results found.  Scheduled Meds:  amLODipine  5 mg Oral Daily   calcium-vitamin D   Oral Daily   doxycycline  100 mg Oral Q12H   enoxaparin (LOVENOX) injection  40 mg Subcutaneous Q24H   lisinopril  20 mg Oral Daily   melatonin  5 mg Oral QHS   metoprolol  succinate  25 mg Oral QPM   Continuous Infusions:   LOS: 2 days    Time spent: 35 mins    Etheleen Valtierra, MD [facility]   If 7PM-7AM, please contact night-coverage

## 2022-03-06 DIAGNOSIS — M25461 Effusion, right knee: Secondary | ICD-10-CM | POA: Diagnosis not present

## 2022-03-06 DIAGNOSIS — M25561 Pain in right knee: Secondary | ICD-10-CM | POA: Diagnosis not present

## 2022-03-06 LAB — BODY FLUID CULTURE W GRAM STAIN: Culture: NO GROWTH

## 2022-03-06 MED ORDER — BUPIVACAINE HCL (PF) 0.5 % IJ SOLN
10.0000 mL | Freq: Once | INTRAMUSCULAR | Status: DC
  Filled 2022-03-06: qty 10

## 2022-03-06 MED ORDER — METHYLPREDNISOLONE ACETATE 40 MG/ML IJ SUSP
40.0000 mg | Freq: Once | INTRAMUSCULAR | Status: DC
  Filled 2022-03-06: qty 1

## 2022-03-06 MED ORDER — TRAMADOL HCL 50 MG PO TABS: 50.0000 mg | ORAL_TABLET | Freq: Three times a day (TID) | ORAL | 0 refills | Status: AC | PRN

## 2022-03-06 NOTE — Progress Notes (Signed)
Occupational Therapy Treatment Patient Details Name: Tanner Rice MRN: [id number] DOB: [birth year] Today's Date: 03/06/2022   History of present illness Pt is a 88 y.o.M who presents 03/02/2022 with suddent onset of right knee pain and ambulation dysfunction now s/p knee aspiration. Significant PMH: RA, HTN, BPH, remote history of Baker's cyst.   OT comments  Patient up in bathroom upon arrival. Patient able to perform toilet hygiene seated and required verbal cues for hand placement and min guard to stand from 3n1 over commode. Patient stood at sink for grooming tasks. Patient asked to return to bed with no assistance for sit to supine. Patient was setup for breakfast with assistance to open containers. Acute OT to continue to follow.    Recommendations for follow up therapy are one component of a multi-disciplinary discharge planning process, led by the attending physician.  Recommendations may be updated based on patient status, additional functional criteria and insurance authorization.    Follow Up Recommendations  No OT follow up    Assistance Recommended at Discharge PRN  Patient can return home with the following  A little help with walking and/or transfers;A little help with bathing/dressing/bathroom;Assistance with cooking/housework;Help with stairs or ramp for entrance   Equipment Recommendations  BSC/3in1    Recommendations for Other Services      Precautions / Restrictions Precautions Precautions: Fall Restrictions Weight Bearing Restrictions: No       Mobility Bed Mobility Overal bed mobility: Modified Independent Bed Mobility: Sit to Supine       Sit to supine: Modified independent (Device/Increase time), HOB elevated   General bed mobility comments: verbal cues to position self in bed    Transfers Overall transfer level: Needs assistance Equipment used: Rolling walker (2 wheels) Transfers: Sit to/from Stand Sit to Stand: Min guard            General transfer comment: min guard to stand from toilet and recliner with verbal cues for hand placement     Balance Overall balance assessment: Needs assistance Sitting-balance support: Feet supported, No upper extremity supported Sitting balance-Leahy Scale: Good     Standing balance support: Single extremity supported, Bilateral upper extremity supported, During functional activity Standing balance-Leahy Scale: Fair Standing balance comment: able to stand at sink for grooming tasks                           ADL either performed or assessed with clinical judgement   ADL Overall ADL's : Needs assistance/impaired Eating/Feeding: Set up;Sitting Eating/Feeding Details (indicate cue type and reason): required assistance to open containers Grooming: Wash/dry hands;Wash/dry face;Min guard;Standing Grooming Details (indicate cue type and reason): Standing at sink                 Toilet Transfer: Min guard;Ambulation;Regular Toilet;Rolling walker (2 wheels);BSC/3in1 Toilet Transfer Details (indicate cue type and reason): on 3n1 over commode upon entry, required min guard and cues for hand placement to stand Toileting- Clothing Manipulation and Hygiene: Supervision/safety;Sitting/lateral lean         General ADL Comments: patient seen for toileting and grooming due to complaints of fatigue    Extremity/Trunk Assessment              Vision       Perception     Praxis      Cognition Arousal/Alertness: Awake/alert Behavior During Therapy: WFL for tasks assessed/performed Overall Cognitive Status: Within Functional Limits for tasks assessed  Exercises      Shoulder Instructions       General Comments      Pertinent Vitals/ Pain       Pain Assessment Pain Assessment: Faces Faces Pain Scale: Hurts a little bit Pain Location: L knee Pain Descriptors / Indicators: Discomfort, Grimacing,  Guarding Pain Intervention(s): Limited activity within patient's tolerance, Monitored during session, Repositioned  Home Living                                          Prior Functioning/Environment              Frequency  Min 2X/week        Progress Toward Goals  OT Goals(current goals can now be found in the care plan section)  Progress towards OT goals: Progressing toward goals  Acute Rehab OT Goals Patient Stated Goal: get better OT Goal Formulation: With patient Time For Goal Achievement: 03/18/22 Potential to Achieve Goals: Good ADL Goals Pt Will Perform Lower Body Dressing: with modified independence;sitting/lateral leans;sit to/from stand;with adaptive equipment Pt Will Transfer to Toilet: with modified independence;ambulating;bedside commode Pt Will Perform Toileting - Clothing Manipulation and hygiene: with modified independence;sitting/lateral leans;sit to/from stand Pt Will Perform Tub/Shower Transfer: Tub transfer;3 in 1;rolling walker;Stand pivot transfer Additional ADL Goal #1: Pt will implement 1-2 energy conservation techniques during ADL session to assist in maximizing independence with self care (Issue and review EC handout)  Plan Discharge plan remains appropriate    Co-evaluation                 AM-PAC OT "6 Clicks" Daily Activity     Outcome Measure   Help from another person eating meals?: None Help from another person taking care of personal grooming?: None Help from another person toileting, which includes using toliet, bedpan, or urinal?: A Little Help from another person bathing (including washing, rinsing, drying)?: A Little Help from another person to put on and taking off regular upper body clothing?: None Help from another person to put on and taking off regular lower body clothing?: A Little 6 Click Score: 21    End of Session Equipment Utilized During Treatment: Rolling walker (2 wheels)  OT Visit  Diagnosis: Other abnormalities of gait and mobility (R26.89);Pain   Activity Tolerance Patient limited by fatigue   Patient Left in bed;with call bell/phone within reach;with bed alarm set   Nurse Communication Mobility status        Time: 3094-0768 OT Time Calculation (min): 28 min  Charges: OT General Charges $OT Visit: 1 Visit OT Treatments $Self Care/Home Management : 23-37 mins  Lodema Hong, [place]  Office [phone]   Trixie Dredge 03/06/2022, 9:52 AM

## 2022-03-06 NOTE — Progress Notes (Signed)
Mobility Specialist - Progress Note   03/06/22 1041  Mobility  Activity Ambulated with assistance in hallway  Level of Assistance Standby assist, set-up cues, supervision of patient - no hands on  Assistive Device Front wheel walker  Distance Ambulated (ft) 300 ft  Activity Response Tolerated well  $Mobility charge 1 Mobility    Pt received in bed agreeable to mobility. C/o tightness in LLE. Left in bed w/ call bell in reach and all needs met.   Paulla Dolly Mobility Specialist

## 2022-03-06 NOTE — Discharge Summary (Signed)
Physician Discharge Summary  Tanner Rice KVQ:[id number] DOB: [birth year] DOA: 03/02/2022  PCP: Lajean Manes, MD  Admit date: 03/02/2022 Discharge date: 03/06/2022 Discharging to: home with his wife    Consults:  ID Orthopedic surgery    Discharge Diagnoses:   Principal Problem:   Pain and swelling of right knee Active Problems:   Rheumatoid arthritis involving multiple sites with positive rheumatoid factor (HCC)   Knee osteoarthritis   Effusion of right knee joint     Hospital Course:  This is an 88 year old male with rheumatoid arthritis who receives DMARDs, history of Baker's cyst, hypertension, BPH, history of MRSA discitis chronically on doxycycline who presented to the ED with right knee pain and swelling.  He underwent fluid aspiration and a steroid injection.  Principal Problem:   Pain and swelling of right knee -ID and orthopedic surgery consults requested -16,800 WBCs with 89% neutrophils noted in aspirate -Culture from aspirate is negative -CRP 19.3 and ESR 64 - Effusion is suspected to be either secondary to rheumatoid arthritis or CPPD -The patient's pain has resolved and he has been ambulating without concern  Active Problems:   Rheumatoid arthritis involving multiple sites with positive rheumatoid factor (HCC) -We will follow-up with his rheumatologist for his Simponi-he missed a dose due to a recent hernia repair  History of MRSA bacteremia and thoracic discitis - Continue doxycycline for chronic        Discharge Instructions  Discharge Instructions     Diet - low sodium heart healthy   Complete by: As directed    Increase activity slowly   Complete by: As directed    No wound care   Complete by: As directed       Allergies as of 03/06/2022       Reactions   Other Diarrhea, Nausea And Vomiting, Other (See Comments)   Seafood - mussels    Penicillins Cross Reactors Hives   Did it involve swelling of the face/tongue/throat, SOB, or low  BP? Unknown Did it involve sudden or severe rash/hives, skin peeling, or any reaction on the inside of your mouth or nose? Unknown Did you need to seek medical attention at a hospital or doctor's office? Yes When did it last happen? over 65 years ago     If all above answers are "NO", may proceed with cephalosporin use. .        Medication List     TAKE these medications    acetaminophen 650 MG CR tablet Commonly known as: TYLENOL Take 650 mg by mouth every 8 (eight) hours as needed for pain.   alfuzosin 10 MG 24 hr tablet Commonly known as: UROXATRAL Take 10 mg by mouth every evening.   amLODipine 5 MG tablet Commonly known as: NORVASC Take 5 mg by mouth daily.   CALCIUM 600/VITAMIN D3 PO Take 1 tablet by mouth daily.   cholecalciferol 25 MCG (1000 UNIT) tablet Commonly known as: VITAMIN D3 Take 1,000 Units by mouth in the morning and at bedtime.   diclofenac Sodium 1 % Gel Commonly known as: VOLTAREN Apply 1 Application topically 2 (two) times daily as needed (pain).   doxycycline 100 MG tablet Commonly known as: VIBRA-TABS Take 1 tablet (100 mg total) by mouth 2 (two) times daily.   Ferrex 150 150 MG capsule Generic drug: iron polysaccharides Take 150 mg by mouth daily.   finasteride 5 MG tablet Commonly known as: PROSCAR Take 5 mg by mouth daily.   folic acid 1 MG tablet Commonly  known as: FOLVITE Take 1 mg by mouth daily.   golimumab 50 MG/4ML Soln injection Commonly known as: SIMPONI ARIA Inject 50 mg into the vein See admin instructions. Inject 50 mg intravenous Every 6-8 weeks per patient   lisinopril 20 MG tablet Commonly known as: ZESTRIL Take 20 mg by mouth daily.   melatonin 5 MG Tabs Take 5 mg by mouth at bedtime.   methotrexate 2.5 MG tablet Commonly known as: RHEUMATREX Take 10 mg by mouth See admin instructions. Take 10 mg twice daily every Thursday per patient   metoprolol succinate 50 MG 24 hr tablet Commonly known as:  TOPROL-XL Take 1 tablet (50 mg total) by mouth daily. Take with or immediately following a meal.   multivitamin with minerals Tabs tablet Take 1 tablet by mouth daily.   sennosides-docusate sodium 8.6-50 MG tablet Commonly known as: SENOKOT-S Take 1 tablet by mouth daily as needed for constipation.   traMADol 50 MG tablet Commonly known as: Ultram Take 1 tablet (50 mg total) by mouth every 8 (eight) hours as needed. What changed: when to take this   Turmeric Curcumin Caps Take 1 capsule by mouth daily.   Vitamin B12 1000 MCG Tbcr Take 1,000 mcg by mouth in the morning and at bedtime.            The results of significant diagnostics from this hospitalization (including imaging, microbiology, ancillary and laboratory) are listed below for reference.    DG Shoulder Left Port  Result Date: 03/03/2022 CLINICAL DATA:  Left shoulder and knee pain EXAM: LEFT SHOULDER three views COMPARISON:  None Available. FINDINGS: No evidence of fracture or malalignment. The humeral head is located. Degenerative osteoarthritis present at the acromioclavicular joint with a downward directed osteophyte arising from the acromion process. Remote healed left rib fracture. No lytic or blastic osseous lesion. IMPRESSION: 1. Degenerative osteoarthritis at the left glenohumeral and acromioclavicular joints. 2. No acute fracture or malalignment. Electronically Signed   By: Jacqulynn Cadet M.D.   On: 03/03/2022 11:04   DG Knee Complete 4 Views Left  Result Date: 03/03/2022 CLINICAL DATA:  Left knee pain beginning yesterday. No known injury. EXAM: LEFT KNEE - COMPLETE 4+ VIEW COMPARISON:  None Available. FINDINGS: No evidence of fracture, dislocation, or joint effusion. Mild chondrocalcinosis is noted without other signs of arthropathy. No focal bone lesions identified. Peripheral vascular calcification noted. IMPRESSION: No acute findings. Mild chondrocalcinosis. Electronically Signed   By: Marlaine Hind M.D.    On: 03/03/2022 11:03   DG Knee Right Port  Result Date: 03/02/2022 CLINICAL DATA:  Generalized pain, no reported injury EXAM: PORTABLE RIGHT KNEE - 1-2 VIEW COMPARISON:  None Available. FINDINGS: No fracture or dislocation. Apparent moderate suprapatellar right knee joint effusion on the limited slightly obliqued cross-table lateral view. Mild tricompartmental right knee osteoarthritis with meniscal chondrocalcinosis. No suspicious focal osseous lesions. No radiopaque foreign bodies. IMPRESSION: Apparent moderate suprapatellar right knee joint effusion. No fracture or malalignment. Mild tricompartmental right knee osteoarthritis with meniscal chondrocalcinosis, suggesting CPPD arthropathy. Electronically Signed   By: Ilona Sorrel M.D.   On: 03/02/2022 15:47   Labs:   Basic Metabolic Panel: Recent Labs  Lab 03/02/22 1305 03/03/22 0103 03/05/22 0104  NA 133* 133* 132*  K 3.8 3.8 4.2  CL 102 99 97*  CO2 23 25 26   GLUCOSE 103* 100* 131*  BUN 17 23 23   CREATININE 0.88 0.97 0.82  CALCIUM 9.8 9.7 9.7  MG 1.9  --  1.7  PHOS  --   --  3.0     CBC: Recent Labs  Lab 03/02/22 1305 03/03/22 0103 03/05/22 0104  WBC 16.1* 10.8* 10.5  NEUTROABS 12.8*  --   --   HGB 12.9* 10.9* 11.3*  HCT 36.8* 32.6* 32.4*  MCV 96.1 97.3 94.2  PLT 183 192 232         SIGNED:   Debbe Odea, MD  [facility] 03/06/2022, 12:34 PM

## 2022-03-06 NOTE — Care Management Important Message (Signed)
Important Message  Patient Details  Name: Tanner Rice MRN: [id number] Date of Birth: Aug 23, 1933   Medicare Important Message Given:  Yes     Hannah Beat 03/06/2022, 2:04 PM

## 2022-03-06 NOTE — TOC Transition Note (Signed)
Transition of Care [facility]) - CM/SW Discharge Note   Patient Details  Name: Tanner Rice MRN: [id number] Date of Birth: 07/08/1933  Transition of Care [facility]) CM/SW Contact:  Carles Collet, RN Phone Number: 03/06/2022, 1:45 PM   Clinical Narrative:    Damaris Schooner w patient and wife over the phone.  They decline 3/1 and would like RW delivered to the house. Order placed and [employer] notified.  No other TOC needs identified for DC   Final next level of care: Home/Self Care Barriers to Discharge: No Barriers Identified   Patient Goals and CMS Choice Patient states their goals for this hospitalization and ongoing recovery are:: home with wife CMS Medicare.gov Compare Post Acute Care list provided to:: Patient (wife) Choice offered to / list presented to : Patient, Spouse  Discharge Placement                       Discharge Plan and Services   Discharge Planning Services: CM Consult Post Acute Care Choice: Home Health          DME Arranged: Walker rolling DME Agency: [employer] Date DME Agency Contacted: 03/06/22 Time DME Agency Contacted: [phone] Representative spoke with at DME Agency: Bureau: Refused [place]          Social Determinants of Health ([place]) Interventions     Readmission Risk Interventions     No data to display

## 2022-03-19 DIAGNOSIS — M0579 Rheumatoid arthritis with rheumatoid factor of multiple sites without organ or systems involvement: Secondary | ICD-10-CM | POA: Diagnosis not present

## 2022-03-28 ENCOUNTER — Inpatient Hospital Stay (HOSPITAL_COMMUNITY): Payer: Medicare Other

## 2022-03-28 ENCOUNTER — Emergency Department (HOSPITAL_COMMUNITY): Payer: Medicare Other

## 2022-03-28 ENCOUNTER — Other Ambulatory Visit: Payer: Self-pay

## 2022-03-28 ENCOUNTER — Inpatient Hospital Stay (HOSPITAL_COMMUNITY)
Admission: EM | Admit: 2022-03-28 | Discharge: 2022-03-30 | DRG: 093 | Disposition: A | Discharge status: Home / Self Care | Payer: Medicare Other | Attending: Neurology | Admitting: Neurology

## 2022-03-28 DIAGNOSIS — Z91013 Allergy to seafood: Secondary | ICD-10-CM

## 2022-03-28 DIAGNOSIS — R001 Bradycardia, unspecified: Secondary | ICD-10-CM | POA: Diagnosis present

## 2022-03-28 DIAGNOSIS — Z87891 Personal history of nicotine dependence: Secondary | ICD-10-CM | POA: Diagnosis not present

## 2022-03-28 DIAGNOSIS — E162 Hypoglycemia, unspecified: Secondary | ICD-10-CM | POA: Diagnosis present

## 2022-03-28 DIAGNOSIS — I6502 Occlusion and stenosis of left vertebral artery: Secondary | ICD-10-CM | POA: Diagnosis present

## 2022-03-28 DIAGNOSIS — R6889 Other general symptoms and signs: Secondary | ICD-10-CM | POA: Diagnosis present

## 2022-03-28 DIAGNOSIS — Z880 Allergy status to penicillin: Secondary | ICD-10-CM | POA: Diagnosis not present

## 2022-03-28 DIAGNOSIS — Z85820 Personal history of malignant melanoma of skin: Secondary | ICD-10-CM

## 2022-03-28 DIAGNOSIS — R4701 Aphasia: Principal | ICD-10-CM | POA: Diagnosis present

## 2022-03-28 DIAGNOSIS — I4510 Unspecified right bundle-branch block: Secondary | ICD-10-CM | POA: Diagnosis not present

## 2022-03-28 DIAGNOSIS — Z1152 Encounter for screening for COVID-19: Secondary | ICD-10-CM | POA: Diagnosis not present

## 2022-03-28 DIAGNOSIS — R55 Syncope and collapse: Secondary | ICD-10-CM

## 2022-03-28 DIAGNOSIS — W19XXXA Unspecified fall, initial encounter: Secondary | ICD-10-CM | POA: Diagnosis not present

## 2022-03-28 DIAGNOSIS — I639 Cerebral infarction, unspecified: Secondary | ICD-10-CM

## 2022-03-28 DIAGNOSIS — G510 Bell's palsy: Secondary | ICD-10-CM | POA: Diagnosis present

## 2022-03-28 DIAGNOSIS — R471 Dysarthria and anarthria: Secondary | ICD-10-CM | POA: Diagnosis present

## 2022-03-28 DIAGNOSIS — Z8042 Family history of malignant neoplasm of prostate: Secondary | ICD-10-CM | POA: Diagnosis not present

## 2022-03-28 DIAGNOSIS — I6389 Other cerebral infarction: Secondary | ICD-10-CM | POA: Diagnosis not present

## 2022-03-28 DIAGNOSIS — R42 Dizziness and giddiness: Secondary | ICD-10-CM | POA: Diagnosis not present

## 2022-03-28 DIAGNOSIS — M069 Rheumatoid arthritis, unspecified: Secondary | ICD-10-CM | POA: Diagnosis present

## 2022-03-28 DIAGNOSIS — I6381 Other cerebral infarction due to occlusion or stenosis of small artery: Secondary | ICD-10-CM | POA: Diagnosis not present

## 2022-03-28 DIAGNOSIS — R410 Disorientation, unspecified: Secondary | ICD-10-CM | POA: Diagnosis not present

## 2022-03-28 DIAGNOSIS — R4182 Altered mental status, unspecified: Secondary | ICD-10-CM | POA: Diagnosis not present

## 2022-03-28 DIAGNOSIS — I951 Orthostatic hypotension: Secondary | ICD-10-CM | POA: Diagnosis not present

## 2022-03-28 DIAGNOSIS — R29703 NIHSS score 3: Secondary | ICD-10-CM | POA: Diagnosis present

## 2022-03-28 DIAGNOSIS — I10 Essential (primary) hypertension: Secondary | ICD-10-CM | POA: Diagnosis present

## 2022-03-28 DIAGNOSIS — I6522 Occlusion and stenosis of left carotid artery: Secondary | ICD-10-CM | POA: Diagnosis not present

## 2022-03-28 DIAGNOSIS — G459 Transient cerebral ischemic attack, unspecified: Secondary | ICD-10-CM | POA: Diagnosis not present

## 2022-03-28 LAB — DIFFERENTIAL
Abs Immature Granulocytes: 0.03 10*3/uL (ref 0.00–0.07)
Basophils Absolute: 0 10*3/uL (ref 0.0–0.1)
Basophils Relative: 1 %
Eosinophils Absolute: 0.1 10*3/uL (ref 0.0–0.5)
Eosinophils Relative: 1 %
Immature Granulocytes: 0 %
Lymphocytes Relative: 18 %
Lymphs Abs: 1.5 10*3/uL (ref 0.7–4.0)
Monocytes Absolute: 0.7 10*3/uL (ref 0.1–1.0)
Monocytes Relative: 8 %
Neutro Abs: 5.9 10*3/uL (ref 1.7–7.7)
Neutrophils Relative %: 72 %

## 2022-03-28 LAB — RAPID URINE DRUG SCREEN, HOSP PERFORMED
Amphetamines: NOT DETECTED
Barbiturates: NOT DETECTED
Benzodiazepines: NOT DETECTED
Cocaine: NOT DETECTED
Opiates: NOT DETECTED
Tetrahydrocannabinol: NOT DETECTED

## 2022-03-28 LAB — I-STAT CHEM 8, ED
BUN: 19 mg/dL (ref 8–23)
Calcium, Ion: 1.21 mmol/L (ref 1.15–1.40)
Chloride: 100 mmol/L (ref 98–111)
Creatinine, Ser: 0.7 mg/dL (ref 0.61–1.24)
Glucose, Bld: 65 mg/dL — ABNORMAL LOW (ref 70–99)
HCT: 33 % — ABNORMAL LOW (ref 39.0–52.0)
Hemoglobin: 11.2 g/dL — ABNORMAL LOW (ref 13.0–17.0)
Potassium: 4.4 mmol/L (ref 3.5–5.1)
Sodium: 136 mmol/L (ref 135–145)
TCO2: 25 mmol/L (ref 22–32)

## 2022-03-28 LAB — ECHOCARDIOGRAM COMPLETE
AR max vel: 1.85 cm2
AV Area VTI: 1.98 cm2
AV Area mean vel: 2 cm2
AV Mean grad: 7.5 mmHg
AV Peak grad: 16 mmHg
Ao pk vel: 2 m/s
Area-P 1/2: 2.42 cm2
Height: 66 in
S' Lateral: 3.8 cm
Weight: 2462.1 oz

## 2022-03-28 LAB — CBG MONITORING, ED
Glucose-Capillary: 63 mg/dL — ABNORMAL LOW (ref 70–99)
Glucose-Capillary: 88 mg/dL (ref 70–99)

## 2022-03-28 LAB — COMPREHENSIVE METABOLIC PANEL
ALT: 14 U/L (ref 0–44)
AST: 24 U/L (ref 15–41)
Albumin: 3.5 g/dL (ref 3.5–5.0)
Alkaline Phosphatase: 80 U/L (ref 38–126)
Anion gap: 10 (ref 5–15)
BUN: 16 mg/dL (ref 8–23)
CO2: 25 mmol/L (ref 22–32)
Calcium: 10.3 mg/dL (ref 8.9–10.3)
Chloride: 103 mmol/L (ref 98–111)
Creatinine, Ser: 0.83 mg/dL (ref 0.61–1.24)
GFR, Estimated: >60 mL/min (ref >60)
Glucose, Bld: 71 mg/dL (ref 70–99)
Potassium: 4.5 mmol/L (ref 3.5–5.1)
Sodium: 138 mmol/L (ref 135–145)
Total Bilirubin: 0.6 mg/dL (ref 0.3–1.2)
Total Protein: 5.8 g/dL — ABNORMAL LOW (ref 6.5–8.1)

## 2022-03-28 LAB — CBC
HCT: 33.6 % — ABNORMAL LOW (ref 39.0–52.0)
Hemoglobin: 11.3 g/dL — ABNORMAL LOW (ref 13.0–17.0)
MCH: 32.5 pg (ref 26.0–34.0)
MCHC: 33.6 g/dL (ref 30.0–36.0)
MCV: 96.6 fL (ref 80.0–100.0)
Platelets: 177 10*3/uL (ref 150–400)
RBC: 3.48 MIL/uL — ABNORMAL LOW (ref 4.22–5.81)
RDW: 12.7 % (ref 11.5–15.5)
WBC: 8.2 10*3/uL (ref 4.0–10.5)
nRBC: 0 % (ref 0.0–0.2)

## 2022-03-28 LAB — URINALYSIS, ROUTINE W REFLEX MICROSCOPIC
Bilirubin Urine: NEGATIVE
Glucose, UA: NEGATIVE mg/dL
Hgb urine dipstick: NEGATIVE
Ketones, ur: NEGATIVE mg/dL
Leukocytes,Ua: NEGATIVE
Nitrite: NEGATIVE
Protein, ur: NEGATIVE mg/dL
Specific Gravity, Urine: 1.017 (ref 1.005–1.030)
pH: 8 (ref 5.0–8.0)

## 2022-03-28 LAB — TROPONIN I (HIGH SENSITIVITY)
Troponin I (High Sensitivity): 7 ng/L (ref <18)
Troponin I (High Sensitivity): 8 ng/L (ref <18)

## 2022-03-28 LAB — SARS CORONAVIRUS 2 BY RT PCR: SARS Coronavirus 2 by RT PCR: NEGATIVE

## 2022-03-28 LAB — URINALYSIS, COMPLETE (UACMP) WITH MICROSCOPIC
Bacteria, UA: NONE SEEN
Bilirubin Urine: NEGATIVE
Glucose, UA: 50 mg/dL — AB
Hgb urine dipstick: NEGATIVE
Ketones, ur: NEGATIVE mg/dL
Leukocytes,Ua: NEGATIVE
Nitrite: NEGATIVE
Protein, ur: NEGATIVE mg/dL
Specific Gravity, Urine: 1.013 (ref 1.005–1.030)
pH: 8 (ref 5.0–8.0)

## 2022-03-28 LAB — D-DIMER, QUANTITATIVE: D-Dimer, Quant: 0.49 ug/mL-FEU (ref 0.00–0.50)

## 2022-03-28 LAB — APTT: aPTT: 34 seconds (ref 24–36)

## 2022-03-28 LAB — ETHANOL: Alcohol, Ethyl (B): <10 mg/dL (ref <10)

## 2022-03-28 LAB — PROTIME-INR
INR: 1.1 (ref 0.8–1.2)
Prothrombin Time: 14 seconds (ref 11.4–15.2)

## 2022-03-28 LAB — HEMOGLOBIN A1C
Hgb A1c MFr Bld: 5.4 % (ref 4.8–5.6)
Mean Plasma Glucose: 108.28 mg/dL

## 2022-03-28 MED ORDER — ACETAMINOPHEN 650 MG RE SUPP: 650.0000 mg | RECTAL | Status: DC | PRN

## 2022-03-28 MED ORDER — ACETAMINOPHEN 160 MG/5ML PO SOLN: 650.0000 mg | ORAL | Status: DC | PRN

## 2022-03-28 MED ORDER — LABETALOL HCL 5 MG/ML IV SOLN: 10.0000 mg | INTRAVENOUS | Status: DC | PRN

## 2022-03-28 MED ORDER — CHLORHEXIDINE GLUCONATE CLOTH 2 % EX PADS
6.0000 | MEDICATED_PAD | Freq: Every day | CUTANEOUS | Status: DC
  Administered 2022-03-28 – 2022-03-30 (×2): 6 via TOPICAL

## 2022-03-28 MED ORDER — DEXTROSE 50 % IV SOLN
50.0000 mL | Freq: Once | INTRAVENOUS | Status: AC
  Administered 2022-03-28: 25 mL via INTRAVENOUS

## 2022-03-28 MED ORDER — IOHEXOL 350 MG/ML SOLN
75.0000 mL | Freq: Once | INTRAVENOUS | Status: AC | PRN
  Administered 2022-03-28: 75 mL via INTRAVENOUS

## 2022-03-28 MED ORDER — SODIUM CHLORIDE 0.9 % IV SOLN: INTRAVENOUS | Status: DC

## 2022-03-28 MED ORDER — SENNOSIDES-DOCUSATE SODIUM 8.6-50 MG PO TABS: 1.0000 | ORAL_TABLET | Freq: Every evening | ORAL | Status: DC | PRN

## 2022-03-28 MED ORDER — ACETAMINOPHEN 325 MG PO TABS: 650.0000 mg | ORAL_TABLET | ORAL | Status: DC | PRN

## 2022-03-28 MED ORDER — PANTOPRAZOLE SODIUM 40 MG IV SOLR
40.0000 mg | Freq: Every day | INTRAVENOUS | Status: DC
  Filled 2022-03-28: qty 10

## 2022-03-28 MED ORDER — STROKE: EARLY STAGES OF RECOVERY BOOK
Freq: Once | Status: AC
  Filled 2022-03-28: qty 1

## 2022-03-28 MED ORDER — TENECTEPLASE FOR STROKE
0.2500 mg/kg | PACK | Freq: Once | INTRAVENOUS | Status: AC
  Administered 2022-03-28: 17 mg via INTRAVENOUS

## 2022-03-28 MED ORDER — HYDRALAZINE HCL 20 MG/ML IJ SOLN: 10.0000 mg | INTRAMUSCULAR | Status: DC | PRN

## 2022-03-28 MED ORDER — ORAL CARE MOUTH RINSE: 15.0000 mL | OROMUCOSAL | Status: DC | PRN

## 2022-03-28 NOTE — Procedures (Signed)
Patient Name: ZYLAN ALMQUIST  MRN: [id number]  Epilepsy Attending: Lora Havens  Referring Physician/Provider: Winfield Rast Date: 03/28/2022 Duration: 29.54 mins  Patient history: 88yo M with syncopal episode with slurred speech, mild aphasia and left facial droop. EEG to evaluate for seizure  Level of alertness: Awake, asleep  AEDs during EEG study: None  Technical aspects: This EEG study was done with scalp electrodes positioned according to the 10-20 International system of electrode placement. Electrical activity was reviewed with band pass filter of 1-'70Hz'$ , sensitivity of 7 uV/mm, display speed of 56m/sec with a '60Hz'$  notched filter applied as appropriate. EEG data were recorded continuously and digitally stored.  Video monitoring was available and reviewed as appropriate.  Description: The posterior dominant rhythm consists of 9 Hz activity of moderate voltage (25-35 uV) seen predominantly in posterior head regions, symmetric and reactive to eye opening and eye closing. Sleep was characterized by vertex waves, maximal frontocentral region. Hyperventilation and photic stimulation were not performed.     IMPRESSION: This study is within normal limits. No seizures or epileptiform discharges were seen throughout the recording.  A normal interictal EEG does not exclude the diagnosis of epilepsy.   Sissi Padia OBarbra Sarks

## 2022-03-28 NOTE — Progress Notes (Signed)
PHARMACIST CODE STROKE RESPONSE  Notified to mix TNK at 10:48 by Dr. Leonel Ramsay TNK preparation completed at 10:52  TNK dose = 17 mg IV over 5 seconds  Issues/delays encountered (if applicable): n/a  Titus Dubin, PharmD PGY1 Pharmacy Resident 03/28/2022 10:59 AM

## 2022-03-28 NOTE — Progress Notes (Incomplete)
Echocardiogram 2D Echocardiogram has been performed.  Ronny Flurry 03/28/2022, 2:47 PM

## 2022-03-28 NOTE — ED Notes (Signed)
Patient being transported to 4N by Stroke RN Tammy at this time. All belongings sent with patient prior to leaving department.

## 2022-03-28 NOTE — H&P (Addendum)
Neurology H&P  CC: Code stroke   History is obtained from:patient, EMS and wife via phone  HPI: Tanner Rice is a 88 y.o. male with past medical history of  HTN, RA, melanoma who presents from work via [place] for a syncopal episode with slurred speech, mild aphasia and left facial droop. Code stroke was activated by EMS. Initial NIHSS 3 for facial droop, aphasia and dysarthria. Patient c/o being lightheaded. CT head with no acute process, ASPECTS 10. CTA head and neck with no LVO. CBG with EMS 143, rechecked in ED 66. After CTA he started to c/o severe dizziness and being cold. CBG rechecked 86. On CMP glucose was  65 treated with 1/2 D50. Temp 96.37F rectal. Due to low NIHSS was "too good to treat", however at 1039 neurological exam worsened and patient developed worsening aphasia. At this time patient was deemed appropriate candidate for TNK administration and pharmacy was called back to mix TNK. TNK was administered at 1053  Patient denied, weakness, vision problems or numbness and tingling.  Wife was updated via phone by Dr. Leonel Ramsay. Risks and benefits were discussed with wife and she provided consent for TNK administration  LKW: 0730 tpa given?: yes, @ 1053  Modified Rankin Scale: 0-Completely asymptomatic and back to baseline post- stroke  ROS: A 14 point ROS was performed and is negative except as noted in the HPI.    Past Medical History:  Diagnosis Date   Hypertension    Melanoma ([place])    upper thigh on right leg   Rheumatoid arthritis(714.0)    Thoracic discitis      Family History  Problem Relation Age of Onset   Prostate cancer Father    Prostate cancer Brother    Osteoarthritis Brother      Social History:  reports that he has quit smoking. His smoking use included pipe, cigars, and cigarettes. He has never used smokeless tobacco. He reports current alcohol use. He reports that he does not use drugs.   Exam: Current vital signs: BP (!) 144/73 (BP Location:  Left Arm)   Pulse 60   Temp 97.7 F (36.5 C) (Oral)   Resp (!) 24   SpO2 100%  Vital signs in last 24 hours: Temp:  [97.7 F (36.5 C)] 97.7 F (36.5 C) (10/19 1040) Pulse Rate:  [51-60] 60 (10/19 1040) Resp:  [24] 24 (10/19 1040) BP: (144)/(73) 144/73 (10/19 1020) SpO2:  [100 %] 100 % (10/19 1040)  Physical Exam  Constitutional: Appears well-developed and well-nourished.  Psych: Affect appropriate to situation Eyes: No scleral injection HENT: No OP obstrucion Head: Normocephalic.  Cardiovascular: Normal rate and regular rhythm.  Respiratory: Effort normal and breath sounds normal to anterior ascultation GI: Soft.  No distension. There is no tenderness.  Skin: WDI  Neuro: Mental Status: Patient is awake, alert, oriented to person, place, month, year, and situation. Patient is able to give a clear and coherent history. Mild aphasia  Cranial Nerves: II: Visual Fields are full. Pupils are equal, round, and reactive to light.   III,IV, VI: EOMI without ptosis or diploplia.  V: Facial sensation is symmetric to temperature VII: left facial droop   VIII: hearing is intact to voice X: Uvula elevates symmetrically XI: Shoulder shrug is symmetric. XII: tongue is midline without atrophy or fasciculations.  Motor: Tone is normal. Bulk is normal. 5/5 strength was present in all four extremities.  Sensory: Sensation is symmetric to light touch and temperature in the arms and legs. Cerebellar:  FNF and HKS are intact bilaterally  Initial NIHSS on arrival  3  1a Level of Conscious.: 0 1b LOC Questions: 0 1c LOC Commands: 0 2 Best Gaze: 0 3 Visual: 0 4 Facial Palsy: 1 5a Motor Arm - left: 0 5b Motor Arm - Right: 0 6a Motor Leg - Left: 0 6b Motor Leg - Right: 0 7 Limb Ataxia: 0 8 Sensory: 0 9 Best Language: 1 10 Dysarthria: 1 11 Extinct. and Inatten.: 0 TOTAL: 3   NIHSS  when patient's neurological exam worsened '@1039'$   1a Level of Conscious.: 0 1b LOC Questions: 1 1c  LOC Commands: 1 2 Best Gaze: 0 3 Visual: 2 4 Facial Palsy: 1 5a Motor Arm - left: 0 5b Motor Arm - Right: 0 6a Motor Leg - Left: 0 6b Motor Leg - Right: 0 7 Limb Ataxia: 0 8 Sensory: 0 9 Best Language: 2 10 Dysarthria: 1 11 Extinct. and Inatten.: 1 TOTAL: 9   I have reviewed labs in epic and the results pertinent to this consultation are: Glucose 65 Hgb 11.3  I have reviewed the images obtained: Code stroke CT Head -No evidence of acute intracranial abnormality.  ASPECTS is 10  CTA head and neck - 1. No emergent large vessel occlusion. 2. Severe stenosis of the left vertebral artery at the C1-C2 level due to mass effect from bulky osteophytes at this level due to severe degenerative change.  Primary Diagnosis:  Acute ischemic infarct s/p IV TNK  Secondary Diagnosis: Essential (primary) hypertension  Recommendations: - Admit to 4N ICU - BP goal as per s/p TNK; 180/105 - HgbA1c, fasting lipid panel - 24 hr s/p TNK MRI of the brain without contrast scheduled for 10/20 @ 1000 - Frequent neuro checks - Echocardiogram - SCD's - bedside swallow eval. If passes can start heart healthy diet. If fails may need NG tube. - No antiplatelets or blood thinners for 24 hrs s/p TNK administration - Risk factor modification - Telemetry monitoring - PT consult, OT consult, Speech consult - Stroke team to follow   Beulah Gandy DNP, ACNPC-AG  I have seen the patient and reviewed the above notes.  He had presyncope without loss of consciousness at work, and was slurring his speech immediately afterwards.  While in the emergency department, he had abrupt development of aphasia, predominantly expressive but also with receptive component as well.  He also had a hemianopia.  Given the abrupt decline, there was high concern for ischemic infarct as etiology of the symptoms, and he was given IV tenecteplase after discussion of risks and benefits and alternatives with his wife.  He is being  admitted for post TNK monitoring.  I will also get an EEG, though my suspicion is much stronger for ischemia than seizure.  Roland Rack, MD [facility] [phone]  If 7pm- 7am, please page neurology on call as listed in [place].

## 2022-03-28 NOTE — Progress Notes (Signed)
EEG complete - results pending 

## 2022-03-28 NOTE — ED Provider Notes (Signed)
[facility] EMERGENCY DEPARTMENT Provider Note   CSN: [id number] Arrival date & time: 03/28/22  1005     History  Chief Complaint  Patient presents with   Code Stroke   Loss of Consciousness    Tanner Rice is a 88 y.o. male.  Patient arrives as a code stroke with EMS.  Patient with history of rheumatoid arthritis, hypertension, chronic discitis on doxycycline, history of melanoma.  Just recently admitted for inflammatory knee process.  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  He was doing normal activity and standing when he felt lightheaded and dizzy and passed out.  He is not sure if he completely lost consciousness or not.  Not sure if he hit his head.  He is not on blood thinners.  When EMS got there they noticed slurred speech, left-sided facial droop but supposedly left-sided facial droop is chronic.  Patient denies any chest pain or shortness of breath.  Blood sugar with EMS was in the 140s.  Denies any numbness, vision changes.  The history is provided by the patient and the EMS personnel.       Home Medications Prior to Admission medications   Medication Sig Start Date End Date Taking? Authorizing Provider  acetaminophen (TYLENOL) 650 MG CR tablet Take 650 mg by mouth every 8 (eight) hours as needed for pain.    [provider]  alfuzosin (UROXATRAL) 10 MG 24 hr tablet Take 10 mg by mouth every evening. 10/20/18   [provider]  amLODipine (NORVASC) 5 MG tablet Take 5 mg by mouth daily. 12/17/21   [provider]  Calcium Carb-Cholecalciferol (CALCIUM 600/VITAMIN D3 PO) Take 1 tablet by mouth daily.    [provider]  cholecalciferol (VITAMIN D) 25 MCG (1000 UT) tablet Take 1,000 Units by mouth in the morning and at bedtime.    [provider]  Cyanocobalamin (VITAMIN B12) 1000 MCG TBCR Take 1,000 mcg by mouth in the morning and at bedtime.    [provider]  diclofenac Sodium (VOLTAREN) 1 %  GEL Apply 1 Application topically 2 (two) times daily as needed (pain).    [provider]  doxycycline (VIBRA-TABS) 100 MG tablet Take 1 tablet (100 mg total) by mouth 2 (two) times daily. 08/14/21 08/14/22  Michel Bickers, MD  finasteride (PROSCAR) 5 MG tablet Take 5 mg by mouth daily. 01/14/22   [provider]  folic acid (FOLVITE) 1 MG tablet Take 1 mg by mouth daily. 01/08/20   [provider]  golimumab (SIMPONI ARIA) 50 MG/4ML SOLN injection Inject 50 mg into the vein See admin instructions. Inject 50 mg intravenous Every 6-8 weeks per patient 01/03/20   [provider]  iron polysaccharides (FERREX 150) 150 MG capsule Take 150 mg by mouth daily.    [provider]  lisinopril (ZESTRIL) 20 MG tablet Take 20 mg by mouth daily. 01/07/19   [provider]  Melatonin 5 MG TABS Take 5 mg by mouth at bedtime.    [provider]  methotrexate (RHEUMATREX) 2.5 MG tablet Take 10 mg by mouth See admin instructions. Take 10 mg twice daily every Thursday per patient 01/31/20   [provider]  metoprolol succinate (TOPROL-XL) 50 MG 24 hr tablet Take 1 tablet (50 mg total) by mouth daily. Take with or immediately following a meal. Patient not taking: Reported on 03/02/2022 02/07/16   Kelvin Cellar, MD  Misc Natural Products (TURMERIC CURCUMIN) CAPS Take 1 capsule by  mouth daily.    [provider]  Multiple Vitamin (MULTIVITAMIN WITH MINERALS) TABS tablet Take 1 tablet by mouth daily.    [provider]  sennosides-docusate sodium (SENOKOT-S) 8.6-50 MG tablet Take 1 tablet by mouth daily as needed for constipation.    [provider]  traMADol (ULTRAM) 50 MG tablet Take 1 tablet (50 mg total) by mouth every 8 (eight) hours as needed. 03/06/22 03/06/23  Debbe Odea, MD      Allergies    Other and Penicillins cross reactors    Review of Systems   Review of Systems  Physical Exam Updated Vital Signs BP 132/67    Pulse (!) 57   Temp (!) 96.3 F (35.7 C) (Rectal)   Resp 19   Wt 69.8 kg   SpO2 100%   BMI 24.84 kg/m  Physical Exam Vitals and nursing note reviewed.  Constitutional:      General: He is not in acute distress.    Appearance: He is well-developed. He is not ill-appearing.  HENT:     Head: Normocephalic and atraumatic.     Mouth/Throat:     Mouth: Mucous membranes are moist.  Eyes:     Extraocular Movements: Extraocular movements intact.     Conjunctiva/sclera: Conjunctivae normal.     Pupils: Pupils are equal, round, and reactive to light.  Cardiovascular:     Rate and Rhythm: Normal rate and regular rhythm.     Pulses: Normal pulses.     Heart sounds: Normal heart sounds. No murmur heard. Pulmonary:     Effort: Pulmonary effort is normal. No respiratory distress.     Breath sounds: Normal breath sounds.  Abdominal:     Palpations: Abdomen is soft.     Tenderness: There is no abdominal tenderness.  Musculoskeletal:        General: No swelling or tenderness. Normal range of motion.     Cervical back: Normal range of motion and neck supple.  Skin:    General: Skin is warm and dry.     Capillary Refill: Capillary refill takes less than 2 seconds.  Neurological:     Mental Status: He is alert and oriented to person, place, and time.     Cranial Nerves: No cranial nerve deficit.     Sensory: No sensory deficit.     Comments: Mild left-sided facial droop, 5+ out of 5 strength throughout otherwise, normal sensation, normal finger-nose-finger, normal visual fields, normal speech, no aphasia  Psychiatric:        Mood and Affect: Mood normal.     ED Results / Procedures / Treatments   Labs (all labs ordered are listed, but only abnormal results are displayed) Labs Reviewed  CBC - Abnormal; Notable for the following components:      Result Value   RBC 3.48 (*)    Hemoglobin 11.3 (*)    HCT 33.6 (*)    All other components within normal limits  I-STAT CHEM 8, ED -  Abnormal; Notable for the following components:   Glucose, Bld 65 (*)    Hemoglobin 11.2 (*)    HCT 33.0 (*)    All other components within normal limits  CBG MONITORING, ED - Abnormal; Notable for the following components:   Glucose-Capillary 63 (*)    All other components within normal limits  SARS CORONAVIRUS 2 BY RT PCR  ETHANOL  DIFFERENTIAL  PROTIME-INR  APTT  COMPREHENSIVE METABOLIC PANEL  RAPID URINE DRUG SCREEN, HOSP PERFORMED  URINALYSIS, ROUTINE  W REFLEX MICROSCOPIC  D-DIMER, QUANTITATIVE (NOT AT [facility])  CBG MONITORING, ED  TROPONIN I (HIGH SENSITIVITY)    EKG EKG Interpretation  Date/Time:  Thursday March 28 2022 10:42:47 EDT Ventricular Rate:  57 PR Interval:  242 QRS Duration: 140 QT Interval:  473 QTC Calculation: 461 R Axis:   -52 Text Interpretation: Sinus rhythm Prolonged PR interval RBBB and LAFB Confirmed by Lennice Sites ([id number]) on 03/28/2022 10:46:22 AM  Radiology DG Chest Portable 1 View  Result Date: 03/28/2022 CLINICAL DATA:  Syncope EXAM: PORTABLE CHEST 1 VIEW COMPARISON:  06/29/2021 FINDINGS: There is interval increase in transverse diameter of hot. This may be partly due to poor inspiration and semi upright position. Thoracic aorta is ectatic. There is peribronchial thickening. There is no focal consolidation. There is no pleural effusion or pneumothorax. Old healed fracture is seen in the posterior left sixth rib. IMPRESSION: Cardiomegaly. Peribronchial thickening suggests bronchitis. There is no focal pulmonary consolidation or signs of alveolar pulmonary edema. Electronically Signed   By: Elmer Picker M.D.   On: 03/28/2022 10:46   CT ANGIO HEAD NECK W WO CM (CODE STROKE)  Result Date: 03/28/2022 CLINICAL DATA:  Neuro deficit, acute, stroke suspected EXAM: CT ANGIOGRAPHY HEAD AND NECK TECHNIQUE: Multidetector CT imaging of the head and neck was performed using the standard protocol during bolus administration of intravenous contrast.  Multiplanar CT image reconstructions and MIPs were obtained to evaluate the vascular anatomy. Carotid stenosis measurements (when applicable) are obtained utilizing NASCET criteria, using the distal internal carotid diameter as the denominator. RADIATION DOSE REDUCTION: This exam was performed according to the departmental dose-optimization program which includes automated exposure control, adjustment of the mA and/or kV according to patient size and/or use of iterative reconstruction technique. CONTRAST:  18m OMNIPAQUE IOHEXOL 350 MG/ML SOLN COMPARISON:  Same day CT head.  CTA head/neck 02/04/2021. FINDINGS: CTA NECK FINDINGS Aortic arch: Great vessel origins are patent without significant stenosis. Right carotid system: No evidence of dissection, stenosis (50% or greater), or occlusion. Left carotid system: No evidence of dissection, stenosis (50% or greater), or occlusion. Atherosclerosis involving the proximal ICA without greater than 50% narrowing. Vertebral arteries: Patent bilaterally. Severe stenosis of the left vertebral artery at the C1-C2 level due to mass effect from bulky osteophytes at this level (for example see series 7, image 174). Skeleton: Severe multilevel degenerative change clipping bulky osteophytes on the left at C1-C2. Other neck: No acute findings. Upper chest: Visualized lung apices are clear. Review of the MIP images confirms the above findings CTA HEAD FINDINGS Anterior circulation: Bilateral intracranial ICAs are patent with mild narrowing due to calcific atherosclerosis. Bilateral MCAs and ACAs are patent without proximal hemodynamically significant stenosis. Posterior circulation: Bilateral intradural vertebral arteries, basilar artery and bilateral posterior cerebral arteries are patent without proximal hemodynamically significant stenosis. Venous sinuses: As permitted by contrast timing, patent. Review of the MIP images confirms the above findings IMPRESSION: 1. No emergent large  vessel occlusion. 2. Severe stenosis of the left vertebral artery at the C1-C2 level due to mass effect from bulky osteophytes at this level due to severe degenerative change. Electronically Signed   By: FMargaretha SheffieldM.D.   On: 03/28/2022 10:45   CT HEAD CODE STROKE WO CONTRAST  Result Date: 03/28/2022 CLINICAL DATA:  Code stroke.  Neuro deficit, acute, stroke suspected EXAM: CT HEAD WITHOUT CONTRAST TECHNIQUE: Contiguous axial images were obtained from the base of the skull through the vertex without intravenous contrast. RADIATION DOSE REDUCTION: This exam was  performed according to the departmental dose-optimization program which includes automated exposure control, adjustment of the mA and/or kV according to patient size and/or use of iterative reconstruction technique. COMPARISON:  CT head October 05, 2020. FINDINGS: Brain: No evidence of acute large vascular territory infarction, hemorrhage, hydrocephalus, extra-axial collection or mass lesion/mass effect. Similar patchy white matter hypodensities, nonspecific but compatible with chronic microvascular ischemic disease. Prior left thalamic lacunar infarct. Vascular: No hyperdense vessel identified.  Calcific atherosclerosis Skull: No acute fracture. Sinuses/Orbits: Clear sinuses.  No acute orbital findings. Other: No mastoid effusions. ASPECTS [facility] Stroke Program Early CT Score) total score (0-10 with 10 being normal): 10. IMPRESSION: No evidence of acute intracranial abnormality.  ASPECTS is 10. Code stroke imaging results were communicated on 03/28/2022 at 10:21 am to provider Dr. Leonel Ramsay via secure text paging. Electronically Signed   By: Margaretha Sheffield M.D.   On: 03/28/2022 10:21    Procedures .Critical Care  Performed by: Lennice Sites, DO Authorized by: Lennice Sites, DO   Critical care provider statement:    Critical care time (minutes):  40   Critical care was necessary to treat or prevent imminent or life-threatening  deterioration of the following conditions:  CNS failure or compromise   Critical care was time spent personally by me on the following activities:  Blood draw for specimens, development of treatment plan with patient or surrogate, discussions with primary provider, evaluation of patient's response to treatment, examination of patient, obtaining history from patient or surrogate, ordering and performing treatments and interventions, ordering and review of laboratory studies, ordering and review of radiographic studies, pulse oximetry, re-evaluation of patient's condition and review of old charts   Care discussed with: admitting provider       Medications Ordered in ED Medications  dextrose 50 % solution 50 mL (25 mLs Intravenous Given 03/28/22 1049)  iohexol (OMNIPAQUE) 350 MG/ML injection 75 mL (75 mLs Intravenous Contrast Given 03/28/22 1032)  tenecteplase (TNKASE) injection for Stroke 17 mg (17 mg Intravenous Given 03/28/22 1053)    ED Course/ Medical Decision Making/ A&P                           Medical Decision Making Amount and/or Complexity of Data Reviewed Labs: ordered. Radiology: ordered.  Risk Prescription drug management. Decision regarding hospitalization.   Whitney Post is here with stroke symptoms, syncope.  Last known normal 7:30 AM.  Patient upon arrival here with blood sugar 63 given dextrose.  Not a diabetic.  Comorbidities include rheumatoid arthritis, hypertension.  Patient appears to have fairly unremarkable neurological exam.  He has mild left-sided facial droop which supposedly is chronic.  Do not notice obvious slurred speech but EMS thought he had slurred speech.  Strength and sensation appear to be intact.  No aphasia or visual field difficulty.  He felt lightheaded and dizzy and then passed out.  Not sure if he fully lost consciousness.  Differential diagnosis includes stroke versus cardiac process/cardiac arrhythmia, possibly PE or pulmonary process, possibly  infectious process, possibly peripheral vertigo.  Patient was made a code stroke in the field.  Dr. Leonel Ramsay with neurology at the bedside upon patient arrival to the ED.  Went directly for head CT which per my review and interpretation shows no head bleed.  Radiology report with no acute findings.  CT angio has also been ordered of the head and neck by neurology.  He will not be a tPA candidate as he has a  very low NIH score.  No major deficits on exam.  Does not appear to have large vessel occlusion.  Will do infectious work-up, cardiac and pulmonary work-up with CBC, BMP, troponin, D-dimer, chest x-ray, urinalysis, EKG.  Overall syncopal episode and will likely need admission given age and risk factors.  Shortly after patient had CT scan, he appeared to develop aphasia.  Dr. Leonel Ramsay with neurology still at the bedside.  Decision at that time was to give TNK.  This is a significant change from his prior exam when he first arrived.  Repeat blood sugar is 88.  Blood pressure 140/73.  Per my review and interpretation of labs she has no significant leukocytosis, anemia, electrolyte abnormality.  Chest x-ray per my review and interpretation shows no obvious pneumonia.  Per my review of radiology report of the CT angio of the head and neck patient does have severe stenosis of the left vertebral artery at C1-C2 level.  Overall suspect may be a posterior circulation stroke.  Patient has been given TNK by neurology and will go to the neuro ICU for further care.  Seems less likely that this is a cardiac or pulmonary process at this time.  This chart was dictated using voice recognition software.  Despite best efforts to proofread,  errors can occur which can change the documentation meaning.         Final Clinical Impression(s) / ED Diagnoses Final diagnoses:  Cerebrovascular accident (CVA), unspecified mechanism [facility])    Rx / DC Orders ED Discharge Orders     None         Lennice Sites,  DO 03/28/22 1056

## 2022-03-28 NOTE — Progress Notes (Signed)
PT Cancellation Note  Patient Details Name: JAHAZIEL FRANCOIS MRN: [id number] DOB: July 03, 1933   Cancelled Treatment:    Reason Eval/Treat Not Completed: Active bedrest order. Will plan to follow-up once activity orders are progressed.    Moishe Spice, PT, DPT Acute Rehabilitation Services  Office: [place] 03/28/2022, 12:45 PM

## 2022-03-28 NOTE — ED Triage Notes (Signed)
Pt arrives via EMS from work with a near syncopal episode and slurred speech. Pt went to work at 55 and was normal. EMS reports a baseline left sided facial droop.  EMS vitals 143 CBG, 150/78, HR 59, 100% RA

## 2022-03-28 NOTE — Progress Notes (Signed)
OT Cancellation Note  Patient Details Name: Tanner Rice MRN: [id number] DOB: 1934-04-23   Cancelled Treatment:    Reason Eval/Treat Not Completed: Active bedrest order (Will return as schedule allows. Thank you.)  [place], OTR/L Acute Rehab Office: [phone] 03/28/2022, 1:54 PM

## 2022-03-28 NOTE — Code Documentation (Signed)
Stroke Response Nurse Documentation Code Documentation  Tanner Rice is a 88 y.o. male arriving to [facility]  via [place] [facility] on 03/28/2022 with past medical hx of hypertension. On No antithrombotic. Code stroke was activated by EMS.   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 Was speaking with coworkers when he "felt light headed and was suddenly on the floor". EMS noted aphasia.   Stroke team at the bedside on patient arrival. Labs drawn and patient cleared for CT by Dr. Ronnald Nian. Patient to CT with team. CBG- 63. NIHSS 2, see documentation for details and code stroke times. Patient with left facial droop and Expressive aphasia  on exam. The following imaging was completed:  CT Head and CTA. Complaining of dizziness. CBG rechecked- 88. Taken back to ED room. At 1039, patient's speech garbled. Not making sense. Notified Dr. Leonel Ramsay who reassessed patient. Lab glucose 65. 1/2 amp dextrose given. Pharmacy notified to return to bedside for possible TNK administration. Dr. Leonel Ramsay called patient's wife. Pharmacy notified via phone to mix at 1045. NIH 9, see documentation. BP rechecked and within goal, see documentation. Patient is a candidate for IV Thrombolytic- given at 1053. Patient is not a candidate for IR due to no LVO.   Care Plan: admit to ICU post TNK; follow TNK orders/protocol.   Bedside handoff with ED RN Maggie/Amy.    Leverne Humbles Stroke Response RN

## 2022-03-28 NOTE — ED Notes (Signed)
PATIENT HAS [facility] CATH PLACE

## 2022-03-29 ENCOUNTER — Inpatient Hospital Stay (HOSPITAL_COMMUNITY): Payer: Medicare Other

## 2022-03-29 DIAGNOSIS — R55 Syncope and collapse: Secondary | ICD-10-CM

## 2022-03-29 DIAGNOSIS — R001 Bradycardia, unspecified: Secondary | ICD-10-CM

## 2022-03-29 DIAGNOSIS — G459 Transient cerebral ischemic attack, unspecified: Secondary | ICD-10-CM | POA: Diagnosis not present

## 2022-03-29 DIAGNOSIS — E162 Hypoglycemia, unspecified: Secondary | ICD-10-CM | POA: Diagnosis not present

## 2022-03-29 LAB — CBC
HCT: 32 % — ABNORMAL LOW (ref 39.0–52.0)
Hemoglobin: 11 g/dL — ABNORMAL LOW (ref 13.0–17.0)
MCH: 32.3 pg (ref 26.0–34.0)
MCHC: 34.4 g/dL (ref 30.0–36.0)
MCV: 93.8 fL (ref 80.0–100.0)
Platelets: 163 10*3/uL (ref 150–400)
RBC: 3.41 MIL/uL — ABNORMAL LOW (ref 4.22–5.81)
RDW: 12.8 % (ref 11.5–15.5)
WBC: 6.2 10*3/uL (ref 4.0–10.5)
nRBC: 0 % (ref 0.0–0.2)

## 2022-03-29 LAB — COMPREHENSIVE METABOLIC PANEL
ALT: 13 U/L (ref 0–44)
AST: 19 U/L (ref 15–41)
Albumin: 3.1 g/dL — ABNORMAL LOW (ref 3.5–5.0)
Alkaline Phosphatase: 68 U/L (ref 38–126)
Anion gap: 11 (ref 5–15)
BUN: 14 mg/dL (ref 8–23)
CO2: 24 mmol/L (ref 22–32)
Calcium: 9.6 mg/dL (ref 8.9–10.3)
Chloride: 102 mmol/L (ref 98–111)
Creatinine, Ser: 0.84 mg/dL (ref 0.61–1.24)
GFR, Estimated: >60 mL/min (ref >60)
Glucose, Bld: 95 mg/dL (ref 70–99)
Potassium: 4 mmol/L (ref 3.5–5.1)
Sodium: 137 mmol/L (ref 135–145)
Total Bilirubin: 1 mg/dL (ref 0.3–1.2)
Total Protein: 5.5 g/dL — ABNORMAL LOW (ref 6.5–8.1)

## 2022-03-29 LAB — LIPID PANEL
Cholesterol: 183 mg/dL (ref 0–200)
HDL: 62 mg/dL (ref >40)
LDL Cholesterol: 103 mg/dL — ABNORMAL HIGH (ref 0–99)
Total CHOL/HDL Ratio: 3 RATIO
Triglycerides: 90 mg/dL (ref <150)
VLDL: 18 mg/dL (ref 0–40)

## 2022-03-29 MED ORDER — FINASTERIDE 5 MG PO TABS
5.0000 mg | ORAL_TABLET | Freq: Every day | ORAL | Status: DC
  Administered 2022-03-29 – 2022-03-30 (×2): 5 mg via ORAL
  Filled 2022-03-29 (×3): qty 1

## 2022-03-29 MED ORDER — ASPIRIN 81 MG PO TBEC
81.0000 mg | DELAYED_RELEASE_TABLET | Freq: Every day | ORAL | Status: DC
  Administered 2022-03-29 – 2022-03-30 (×2): 81 mg via ORAL
  Filled 2022-03-29 (×2): qty 1

## 2022-03-29 MED ORDER — PANTOPRAZOLE SODIUM 40 MG PO TBEC
40.0000 mg | DELAYED_RELEASE_TABLET | Freq: Every day | ORAL | Status: DC
  Administered 2022-03-29: 40 mg via ORAL
  Filled 2022-03-29: qty 1

## 2022-03-29 MED ORDER — FOLIC ACID 1 MG PO TABS
1.0000 mg | ORAL_TABLET | Freq: Every day | ORAL | Status: DC
  Administered 2022-03-29 – 2022-03-30 (×2): 1 mg via ORAL
  Filled 2022-03-29 (×2): qty 1

## 2022-03-29 MED ORDER — ROSUVASTATIN CALCIUM 20 MG PO TABS
20.0000 mg | ORAL_TABLET | Freq: Every day | ORAL | Status: DC
  Administered 2022-03-29 – 2022-03-30 (×2): 20 mg via ORAL
  Filled 2022-03-29 (×2): qty 1

## 2022-03-29 NOTE — Progress Notes (Addendum)
STROKE TEAM PROGRESS NOTE   INTERVAL HISTORY His wife Angelita Ingles) is at the bedside.   Vitals:   03/29/22 0400 03/29/22 0500 03/29/22 0600 03/29/22 0700  BP: 131/64 135/64 (!) 152/75 (!) 151/84  Pulse: (!) 44 (!) 44 (!) 59 (!) 50  Resp: '16 14 16 15  '$ Temp: 97.7 F (36.5 C)     TempSrc: Oral     SpO2: 95% 99% 91% 98%  Weight:      Height:       CBC:  Recent Labs  Lab 03/28/22 1015 03/28/22 1017 03/29/22 0427  WBC 8.2  --  6.2  NEUTROABS 5.9  --   --   HGB 11.3* 11.2* 11.0*  HCT 33.6* 33.0* 32.0*  MCV 96.6  --  93.8  PLT 177  --  811   Basic Metabolic Panel:  Recent Labs  Lab 03/28/22 1015 03/28/22 1017 03/29/22 0427  NA 138 136 137  K 4.5 4.4 4.0  CL 103 100 102  CO2 25  --  24  GLUCOSE 71 65* 95  BUN '16 19 14  '$ CREATININE 0.83 0.70 0.84  CALCIUM 10.3  --  9.6   Lipid Panel:  Recent Labs  Lab 03/29/22 0427  CHOL 183  TRIG 90  HDL 62  CHOLHDL 3.0  VLDL 18  LDLCALC 103*   HgbA1c:  Recent Labs  Lab 03/28/22 1138  HGBA1C 5.4   Urine Drug Screen:  Recent Labs  Lab 03/28/22 1050  LABOPIA NONE DETECTED  COCAINSCRNUR NONE DETECTED  LABBENZ NONE DETECTED  AMPHETMU NONE DETECTED  THCU NONE DETECTED  LABBARB NONE DETECTED    Alcohol Level  Recent Labs  Lab 03/24/22 1007  ETH <10    IMAGING past 24 hours EEG adult  Result Date: 03/28/2022 Lora Havens, MD     03/28/2022  6:11 PM Patient Name: THARUN CAPPELLA MRN: [id number] Epilepsy Attending: Lora Havens Referring Physician/Provider: Winfield Rast Date: 03/28/2022 Duration: 29.54 mins Patient history: 88yo M with syncopal episode with slurred speech, mild aphasia and left facial droop. EEG to evaluate for seizure Level of alertness: Awake, asleep AEDs during EEG study: None Technical aspects: This EEG study was done with scalp electrodes positioned according to the 10-20 International system of electrode placement. Electrical activity was reviewed with band pass filter of 1-'70Hz'$ ,  sensitivity of 7 uV/mm, display speed of 70m/sec with a '60Hz'$  notched filter applied as appropriate. EEG data were recorded continuously and digitally stored.  Video monitoring was available and reviewed as appropriate. Description: The posterior dominant rhythm consists of 9 Hz activity of moderate voltage (25-35 uV) seen predominantly in posterior head regions, symmetric and reactive to eye opening and eye closing. Sleep was characterized by vertex waves, maximal frontocentral region. Hyperventilation and photic stimulation were not performed.   IMPRESSION: This study is within normal limits. No seizures or epileptiform discharges were seen throughout the recording. A normal interictal EEG does not exclude the diagnosis of epilepsy. PLora Havens  ECHOCARDIOGRAM COMPLETE  Result Date: 03/28/2022    ECHOCARDIOGRAM REPORT   Patient Name:   JDEJAUN VIDRIODate of Exam: 03/28/2022 Medical Rec #:  [id number]     Height:       66.0 in Accession #:    [id number]    Weight:       153.9 lb Date of Birth:  [birth year]     BSA:          1.789 m  Patient Age:    55 years       BP:           156/63 mmHg Patient Gender: M              HR:           50 bpm. Exam Location:  Inpatient Procedure: 2D Echo, Cardiac Doppler and Color Doppler Indications:    Stroke I63.9  History:        Patient has prior history of Echocardiogram examinations, most                 recent 10/06/2020. Stroke; Risk Factors:Hypertension.  Sonographer:    Ronny Flurry Referring Phys: Marlin  1. Partial fusion of left and noncoronary cusps with very mild AS (peak velocity 2.4 m/s; mean gradient 9 mmHg).  2. Left ventricular ejection fraction, by estimation, is 60 to 65%. The left ventricle has normal function. The left ventricle has no regional wall motion abnormalities. There is mild concentric left ventricular hypertrophy. Left ventricular diastolic parameters are consistent with Grade I diastolic dysfunction  (impaired relaxation).  3. Right ventricular systolic function is normal. The right ventricular size is normal.  4. The mitral valve is normal in structure. Trivial mitral valve regurgitation. No evidence of mitral stenosis.  5. The aortic valve is tricuspid. Aortic valve regurgitation is not visualized. Mild aortic valve stenosis.  6. The inferior vena cava is normal in size with greater than 50% respiratory variability, suggesting right atrial pressure of 3 mmHg. FINDINGS  Left Ventricle: Left ventricular ejection fraction, by estimation, is 60 to 65%. The left ventricle has normal function. The left ventricle has no regional wall motion abnormalities. The left ventricular internal cavity size was normal in size. There is  mild concentric left ventricular hypertrophy. Left ventricular diastolic parameters are consistent with Grade I diastolic dysfunction (impaired relaxation). Right Ventricle: The right ventricular size is normal. Right ventricular systolic function is normal. Left Atrium: Left atrial size was normal in size. Right Atrium: Right atrial size was normal in size. Pericardium: There is no evidence of pericardial effusion. Mitral Valve: The mitral valve is normal in structure. Mild mitral annular calcification. Trivial mitral valve regurgitation. No evidence of mitral valve stenosis. Tricuspid Valve: The tricuspid valve is normal in structure. Tricuspid valve regurgitation is mild . No evidence of tricuspid stenosis. Aortic Valve: The aortic valve is tricuspid. Aortic valve regurgitation is not visualized. Mild aortic stenosis is present. Aortic valve mean gradient measures 7.5 mmHg. Aortic valve peak gradient measures 16.0 mmHg. Aortic valve area, by VTI measures 1.98 cm. Pulmonic Valve: The pulmonic valve was normal in structure. Pulmonic valve regurgitation is trivial. No evidence of pulmonic stenosis. Aorta: The aortic root is normal in size and structure. Venous: The inferior vena cava is normal  in size with greater than 50% respiratory variability, suggesting right atrial pressure of 3 mmHg. IAS/Shunts: No atrial level shunt detected by color flow Doppler. Additional Comments: Partial fusion of left and noncoronary cusps with very mild AS (peak velocity 2.4 m/s; mean gradient 9 mmHg).  LEFT VENTRICLE PLAX 2D LVIDd:         5.00 cm   Diastology LVIDs:         3.80 cm   LV e' medial:    7.46 cm/s LV PW:         1.10 cm   LV E/e' medial:  7.5 LV IVS:  1.20 cm   LV e' lateral:   7.77 cm/s LVOT diam:     2.10 cm   LV E/e' lateral: 7.2 LV SV:         89 LV SV Index:   50 LVOT Area:     3.46 cm  RIGHT VENTRICLE RV S prime:     13.40 cm/s TAPSE (M-mode): 2.6 cm LEFT ATRIUM           Index        RIGHT ATRIUM           Index LA diam:      3.80 cm 2.12 cm/m   RA Area:     11.90 cm LA Vol (A2C): 60.6 ml 33.87 ml/m  RA Volume:   23.10 ml  12.91 ml/m LA Vol (A4C): 34.8 ml 19.45 ml/m  AORTIC VALVE AV Area (Vmax):    1.85 cm AV Area (Vmean):   2.00 cm AV Area (VTI):     1.98 cm AV Vmax:           200.00 cm/s AV Vmean:          118.500 cm/s AV VTI:            0.447 m AV Peak Grad:      16.0 mmHg AV Mean Grad:      7.5 mmHg LVOT Vmax:         107.00 cm/s LVOT Vmean:        68.400 cm/s LVOT VTI:          0.256 m LVOT/AV VTI ratio: 0.57  AORTA Ao Root diam: 3.60 cm Ao Asc diam:  3.20 cm MITRAL VALVE               TRICUSPID VALVE MV Area (PHT): 2.42 cm    TR Peak grad:   28.5 mmHg MV Decel Time: 313 msec    TR Vmax:        267.00 cm/s MV E velocity: 56.30 cm/s MV A velocity: 88.10 cm/s  SHUNTS MV E/A ratio:  0.64        Systemic VTI:  0.26 m                            Systemic Diam: 2.10 cm Kirk Ruths MD Electronically signed by Kirk Ruths MD Signature Date/Time: 03/28/2022/3:05:10 PM    Final    DG Chest Portable 1 View  Result Date: 03/28/2022 CLINICAL DATA:  Syncope EXAM: PORTABLE CHEST 1 VIEW COMPARISON:  06/29/2021 FINDINGS: There is interval increase in transverse diameter of hot. This may  be partly due to poor inspiration and semi upright position. Thoracic aorta is ectatic. There is peribronchial thickening. There is no focal consolidation. There is no pleural effusion or pneumothorax. Old healed fracture is seen in the posterior left sixth rib. IMPRESSION: Cardiomegaly. Peribronchial thickening suggests bronchitis. There is no focal pulmonary consolidation or signs of alveolar pulmonary edema. Electronically Signed   By: Elmer Picker M.D.   On: 03/28/2022 10:46   CT ANGIO HEAD NECK W WO CM (CODE STROKE)  Result Date: 03/28/2022 CLINICAL DATA:  Neuro deficit, acute, stroke suspected EXAM: CT ANGIOGRAPHY HEAD AND NECK TECHNIQUE: Multidetector CT imaging of the head and neck was performed using the standard protocol during bolus administration of intravenous contrast. Multiplanar CT image reconstructions and MIPs were obtained to evaluate the vascular anatomy. Carotid stenosis measurements (when applicable) are obtained utilizing NASCET criteria, using the  distal internal carotid diameter as the denominator. RADIATION DOSE REDUCTION: This exam was performed according to the departmental dose-optimization program which includes automated exposure control, adjustment of the mA and/or kV according to patient size and/or use of iterative reconstruction technique. CONTRAST:  14m OMNIPAQUE IOHEXOL 350 MG/ML SOLN COMPARISON:  Same day CT head.  CTA head/neck 02/04/2021. FINDINGS: CTA NECK FINDINGS Aortic arch: Great vessel origins are patent without significant stenosis. Right carotid system: No evidence of dissection, stenosis (50% or greater), or occlusion. Left carotid system: No evidence of dissection, stenosis (50% or greater), or occlusion. Atherosclerosis involving the proximal ICA without greater than 50% narrowing. Vertebral arteries: Patent bilaterally. Severe stenosis of the left vertebral artery at the C1-C2 level due to mass effect from bulky osteophytes at this level (for example  see series 7, image 174). Skeleton: Severe multilevel degenerative change clipping bulky osteophytes on the left at C1-C2. Other neck: No acute findings. Upper chest: Visualized lung apices are clear. Review of the MIP images confirms the above findings CTA HEAD FINDINGS Anterior circulation: Bilateral intracranial ICAs are patent with mild narrowing due to calcific atherosclerosis. Bilateral MCAs and ACAs are patent without proximal hemodynamically significant stenosis. Posterior circulation: Bilateral intradural vertebral arteries, basilar artery and bilateral posterior cerebral arteries are patent without proximal hemodynamically significant stenosis. Venous sinuses: As permitted by contrast timing, patent. Review of the MIP images confirms the above findings IMPRESSION: 1. No emergent large vessel occlusion. 2. Severe stenosis of the left vertebral artery at the C1-C2 level due to mass effect from bulky osteophytes at this level due to severe degenerative change. Electronically Signed   By: FMargaretha SheffieldM.D.   On: 03/28/2022 10:45   CT HEAD CODE STROKE WO CONTRAST  Result Date: 03/28/2022 CLINICAL DATA:  Code stroke.  Neuro deficit, acute, stroke suspected EXAM: CT HEAD WITHOUT CONTRAST TECHNIQUE: Contiguous axial images were obtained from the base of the skull through the vertex without intravenous contrast. RADIATION DOSE REDUCTION: This exam was performed according to the departmental dose-optimization program which includes automated exposure control, adjustment of the mA and/or kV according to patient size and/or use of iterative reconstruction technique. COMPARISON:  CT head October 05, 2020. FINDINGS: Brain: No evidence of acute large vascular territory infarction, hemorrhage, hydrocephalus, extra-axial collection or mass lesion/mass effect. Similar patchy white matter hypodensities, nonspecific but compatible with chronic microvascular ischemic disease. Prior left thalamic lacunar infarct.  Vascular: No hyperdense vessel identified.  Calcific atherosclerosis Skull: No acute fracture. Sinuses/Orbits: Clear sinuses.  No acute orbital findings. Other: No mastoid effusions. ASPECTS ([facility] Early CT Score) total score (0-10 with 10 being normal): 10. IMPRESSION: No evidence of acute intracranial abnormality.  ASPECTS is 10. Code stroke imaging results were communicated on 03/28/2022 at 10:21 am to provider Dr. KLeonel Ramsayvia secure text paging. Electronically Signed   By: FMargaretha SheffieldM.D.   On: 03/28/2022 10:21    PHYSICAL EXAM  Physical Exam  Constitutional: Appears well-developed and well-nourished.   Cardiovascular: Normal rate and regular rhythm.  Respiratory: Effort normal, non-labored breathing  Neuro: Mental Status: Patient is awake, alert, oriented to person, place, month, year, and situation. Patient is able to give a clear and coherent history. No signs of aphasia or neglect Cranial Nerves: II: Visual Fields are full. Pupils are equal, round, and reactive to light.   III,IV, VI: EOMI without ptosis or diploplia.  V: Facial sensation is symmetric to temperature VII: Facial movement is symmetric resting and smiling VIII: Hearing is intact  to voice X: Palate elevates symmetrically XI: Shoulder shrug is symmetric. XII: Tongue protrudes midline without atrophy or fasciculations.  Motor: Tone is normal. Bulk is normal. 5/5 strength was present in all four extremities.  Sensory: Sensation is symmetric to light touch and temperature in the arms and legs. No extinction to DSS present.  Deep Tendon Reflexes: 2+ and symmetric in the biceps and patellae.  Plantars: Toes are downgoing bilaterally.  Cerebellar: FNF and HKS are intact bilaterally    ASSESSMENT/PLAN Mr. BURLE KWAN is a 88 y.o. male with history of HTN, RA, melanoma presenting with lightheadedness at work and then fell, found to have left facial droop, dysarthria, and aphasia which  have since resolved. He is currently bradycardic. We have held his metoprolol and recommend he follows up with his PCP to re-evaluate the need for it. Also ordered orthostatic vitals as he presentation could have been a syncopal episode from bradycardia or hypotension as well. Also found to have hypoglycemia s/p D50.   Stroke like symptoms s/p TNK syncopal vs hypoglycemic episode vs. TIA  Code Stroke CT head No acute abnormality. ASPECTS 10.    CTA head & neck stenosis of the left vertebral artery at the C1-C2 level due to mass effect from bulky osteophytes at this level due to severe degenerative change. MRI  No evidence of acute intracranial abnormality EEG- No epileptogenic or seizure activity noted on EEG  2D Echo EF 60-65%  LDL 103 HgbA1c 5.4 VTE prophylaxis - SCDs No antithrombotic prior to admission, now on aspirin 81 mg daily.  Therapy recommendations:  No follow up Disposition:  Pending   ? Syncope with orthostasis ? Hypoglycemia  ? Symptomatic bradycardia  Standing at work and felt lightheadedness with fall Orthostatic vital pending Glucose initially 143 but repeat was 66 s/p 1/2 D50 then up to 86 Bradycardia and wife stated that he is on metoprolol with known bradycardia Hold metoprolol now and follow up with PCP Dr. Felipa Eth  Hx of encephalopathy 09/2020 - episode of aphasia and generalized weakness. Nonfocal exam. Concerning for encephalopathy. Work up unrevealing and imaging and EEG negative for an acute process and it was determined that he had an episode of transient confusion.   Hypertension Home meds:  lisinopril, metoprolol succinate Stable Long-term BP goal normotensive  Hyperlipidemia Home meds:  none, resumed in hospital LDL 103, goal < 70 Add rosuvastatin '20mg'$   Continue statin at discharge  Other Stroke Risk Factors Advanced Age >/= 17   Other Active Problems   Hospital day # 1  Patient seen and examined by NP/APP with MD. MD to update note as  needed.   Janine Ores, DNP, FNP-BC [facility] Pager: [phone]   ATTENDING NOTE: I reviewed above note and agree with the assessment and plan. Pt was seen and examined.   88 year old male with history of hypertension, RA, melanoma admitted for episode of lightheadedness, fell at work, slurred speech, left facial droop and aphasia.  CBG initially 143, however on repeat 66, received half D50.  CT no acute abnormality, initially mild symptoms, then getting worse received a TNK.  CTA head and neck unremarkable.  EEG no seizure.  MRI no acute infarct.  EF 60 to 65%, LDL 103, A1c 5.4, UDS negative.  Creatinine 0.84, hemoglobin 11.0.  On exam, patient's symptoms has resolved, no focal deficit, neuro intact.  Etiology for patient's symptoms not quite clear, could be episode of hypotension, orthostasis, bradycardia, but cannot rule out TIA, stroke symptoms aborted by TNK.  Put on aspirin 81, mild bradycardia, will hold off on metoprolol.  Put on Crestor 20.  PT therapy recommend outpatient therapy.  For detailed assessment and plan, please refer to above/below as I have made changes wherever appropriate.   Rosalin Hawking, MD PhD Stroke Neurology 03/29/2022 6:15 PM  This patient is critically ill due to stroke symptoms status post TNK, bradycardia, hypoglycemia and at significant risk of neurological worsening, death form bleeding from TNK, cardiac arrest, stroke. This patient's care requires constant monitoring of vital signs, hemodynamics, respiratory and cardiac monitoring, review of multiple databases, neurological assessment, discussion with family, other specialists and medical decision making of high complexity. I spent 40 minutes of neurocritical care time in the care of this patient. I had long discussion with wife and patient at bedside, updated pt current condition, treatment plan and potential prognosis, and answered all the questions.  They expressed understanding and  appreciation.       To contact Stroke Continuity provider, please refer to [email or web address] After hours, contact General Neurology

## 2022-03-29 NOTE — Evaluation (Signed)
Occupational Therapy Evaluation Patient Details Name: ENRRIQUE MIERZWA MRN: [id number] DOB: 1934/05/25 Today's Date: 03/29/2022   History of Present Illness 88 yo male presenting 10/19 after syncopal episode at work with slurred speech, aphasia, and L facial droop. CT showed no acute abnormality, MRI pending. PMH includes: HTN, melanoma, rheumatoid arthritis, and thoracic discitis.   Clinical Impression   PT admitted with syncope. Pt currently with functional limitiations due to the deficits listed below (see OT problem list). Pt currently demonstrates bed mobility, toilet transfer and sink level grooming without (A). OT to continue to see patient to further assess vitals with full adl and balance challenges. Pt works park time at [employer] at [employer]. Pt would like to continue to work.  Pt will benefit from skilled OT to increase their independence and safety with adls and balance to allow discharge home with spouse.       Recommendations for follow up therapy are one component of a multi-disciplinary discharge planning process, led by the attending physician.  Recommendations may be updated based on patient status, additional functional criteria and insurance authorization.   Follow Up Recommendations  No OT follow up    Assistance Recommended at Discharge PRN  Patient can return home with the following Assist for transportation    Functional Status Assessment  Patient has had a recent decline in their functional status and demonstrates the ability to make significant improvements in function in a reasonable and predictable amount of time.  Equipment Recommendations  None recommended by OT    Recommendations for Other Services       Precautions / Restrictions Precautions Precautions: Fall      Mobility Bed Mobility Overal bed mobility: Modified Independent                  Transfers Overall transfer level: Modified independent                         Balance                                           ADL either performed or assessed with clinical judgement   ADL Overall ADL's : Needs assistance/impaired     Grooming: Modified independent Grooming Details (indicate cue type and reason): standing at sink Upper Body Bathing: Modified independent           Lower Body Dressing: Supervision/safety Lower Body Dressing Details (indicate cue type and reason): don doff socks during session . requires a hip hike position Toilet Transfer: Modified Independent   Toileting- Clothing Manipulation and Hygiene: Modified independent       Functional mobility during ADLs: Supervision/safety       Vision Baseline Vision/History: 1 Wears glasses Vision Assessment?: Yes     Perception     Praxis      Pertinent Vitals/Pain Pain Assessment Pain Assessment: No/denies pain     Hand Dominance Right   Extremity/Trunk Assessment Upper Extremity Assessment Upper Extremity Assessment: Overall WFL for tasks assessed   Lower Extremity Assessment Lower Extremity Assessment: Defer to PT evaluation   Cervical / Trunk Assessment Cervical / Trunk Assessment: Normal   Communication Communication Communication: No difficulties   Cognition Arousal/Alertness: Awake/alert Behavior During Therapy: WFL for tasks assessed/performed Overall Cognitive Status: Within Functional Limits for tasks assessed  General Comments  VSS    Exercises     Shoulder Instructions      Home Living Family/patient expects to be discharged to:: Private residence Living Arrangements: Spouse/significant other Available Help at Discharge: Family Type of Home: House Home Access: Stairs to enter [employer] of Steps: 4 Entrance Stairs-Rails: Right;Left Home Layout: Two level;Able to live on main level with bedroom/bathroom Alternate Level Stairs-Number of Steps:  flight Alternate Level Stairs-Rails: Left Bathroom Shower/Tub: Walk-in shower;Tub/shower unit   Bathroom Toilet: Standard     Home Equipment: Kasandra Knudsen - single point      Lives With: Spouse    Prior Functioning/Environment Prior Level of Function : Needs assist;Driving;Working/employed             Mobility Comments: uses cane ADLs Comments: 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[employer]: Decreased knowledge of use of DME or AE;Decreased knowledge of precautions      OT Treatment/Interventions:      OT Goals(Current goals can be found in the care plan section) Acute Rehab OT Goals Patient Stated Goal: to keep working OT Goal Formulation: With patient Time For Goal Achievement: 04/12/22 Potential to Achieve Goals: Good  OT Frequency:      Co-evaluation              AM-PAC OT "6 Clicks" Daily Activity     Outcome Measure Help from another person eating meals?: None Help from another person taking care of personal grooming?: None Help from another person toileting, which includes using toliet, bedpan, or urinal?: A Little Help from another person bathing (including washing, rinsing, drying)?: A Little Help from another person to put on and taking off regular upper body clothing?: A Little Help from another person to put on and taking off regular lower body clothing?: A Little 6 Click Score: 20   End of Session Equipment Utilized During Treatment: Gait belt Nurse Communication: Mobility status  Activity Tolerance: Patient tolerated treatment well Patient left: in chair;with call bell/phone within reach;with chair alarm set  OT Visit Diagnosis: Unsteadiness on feet (R26.81);Muscle weakness (generalized) (M62.81)                Time: 8144-8185 OT Time Calculation (min): 17 min Charges:  OT General Charges $OT Visit: 1 Visit OT Evaluation $OT Eval Moderate Complexity: 1 Mod   Brynn, OTR/L  Acute Rehabilitation Services Office:  [phone] .   Jeri Modena 03/29/2022, 2:17 PM

## 2022-03-29 NOTE — Evaluation (Signed)
Physical Therapy Evaluation Patient Details Name: Tanner Rice MRN: [id number] DOB: [birth year] Today's Date: 03/29/2022  History of Present Illness  88 yo male presenting 10/19 after syncopal episode at work with slurred speech, aphasia, and L facial droop. CT showed no acute abnormality, MRI pending. PMH includes: HTN, melanoma, rheumatoid arthritis, and thoracic discitis.   Clinical Impression  Pt in bed upon arrival of PT, agreeable to evaluation at this time. Prior to admission the pt was independent with all mobility, but does report he recently started using [facility] for ambulation. The pt now presents with limitations in functional mobility, dynamic stability, and endurance due to above dx, and will continue to benefit from skilled PT to address these deficits. He was able to demo good initial strength and stability for bed mobility and transfers without assist, and then was able to complete ~200 ft ambulation without use of DME or need for assist. Will continue to follow acutely to progress mobility and facilitate full return to full independence.      Recommendations for follow up therapy are one component of a multi-disciplinary discharge planning process, led by the attending physician.  Recommendations may be updated based on patient status, additional functional criteria and insurance authorization.  Follow Up Recommendations Outpatient PT      Assistance Recommended at Discharge Intermittent Supervision/Assistance  Patient can return home with the following  Assistance with cooking/housework;Direct supervision/assist for medications management;Direct supervision/assist for financial management;Assist for transportation;Help with stairs or ramp for entrance    Equipment Recommendations None recommended by PT  Recommendations for Other Services       Functional Status Assessment Patient has had a recent decline in their functional status and demonstrates the ability to make  significant improvements in function in a reasonable and predictable amount of time.     Precautions / Restrictions Precautions Precautions: Fall Restrictions Weight Bearing Restrictions: No      Mobility  Bed Mobility Overal bed mobility: Modified Independent                  Transfers Overall transfer level: Modified independent Equipment used: None               General transfer comment: no assist needed, no instability    Ambulation/Gait Ambulation/Gait assistance: Min guard Gait Distance (Feet): 200 Feet Assistive device: None Gait Pattern/deviations: WFL(Within Functional Limits) Gait velocity: WFL     General Gait Details: pt ambulating slightly faster than he reports is normal, but maintained pace and had no instances of instability or LOB. followed directional cues well  Stairs Stairs: Yes Stairs assistance: Supervision Stair Management: Alternating pattern, One rail Left, Forwards Number of Stairs: 12 General stair comments: minG-supervision, alternating ascending and step-to descending     Balance Overall balance assessment: Mild deficits observed, not formally tested                                            Home Living Family/patient expects to be discharged to:: Private residence Living Arrangements: Spouse/significant other Available Help at Discharge: Family Type of Home: House Home Access: Stairs to enter Entrance Stairs-Rails: Psychiatric nurse of Steps: 4 Alternate Level Stairs-Number of Steps: flight Home Layout: Two level;Able to live on main level with bedroom/bathroom Home Equipment: Kasandra Knudsen - single point      Prior Function Prior Level of Function : Needs assist;Driving;Working/employed  Mobility Comments: uses cane ADLs Comments: 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[place]: Right    Extremity/Trunk Assessment   Upper Extremity  Assessment Upper Extremity Assessment: Defer to OT evaluation    Lower Extremity Assessment Lower Extremity Assessment: Overall WFL for tasks assessed    Cervical / Trunk Assessment Cervical / Trunk Assessment: Normal  Communication   Communication: No difficulties  Cognition Arousal/Alertness: Awake/alert Behavior During Therapy: WFL for tasks assessed/performed Overall Cognitive Status: Within Functional Limits for tasks assessed                                          General Comments General comments (skin integrity, edema, etc.): VSS on RA        Assessment/Plan    PT Assessment Patient needs continued PT services  PT Problem List Decreased activity tolerance;Decreased balance       PT Treatment Interventions DME instruction;Gait training;Stair training;Functional mobility training;Therapeutic activities;Therapeutic exercise;Balance training;Patient/family education    PT Goals (Current goals can be found in the Care Plan section)  Acute Rehab PT Goals Patient Stated Goal: return home and to work PT Goal Formulation: With patient Time For Goal Achievement: 04/12/22 Potential to Achieve Goals: Good    Frequency Min 3X/week        AM-PAC PT "6 Clicks" Mobility  Outcome Measure Help needed turning from your back to your side while in a flat bed without using bedrails?: None Help needed moving from lying on your back to sitting on the side of a flat bed without using bedrails?: None Help needed moving to and from a bed to a chair (including a wheelchair)?: A Little Help needed standing up from a chair using your arms (e.g., wheelchair or bedside chair)?: A Little Help needed to walk in hospital room?: A Little Help needed climbing 3-5 steps with a railing? : A Little 6 Click Score: 20    End of Session Equipment Utilized During Treatment: Gait belt Activity Tolerance: Patient tolerated treatment well Patient left: in chair;with call  bell/phone within reach;with chair alarm set Nurse Communication: Mobility status PT Visit Diagnosis: Unsteadiness on feet (R26.81);Other abnormalities of gait and mobility (R26.89)    Time: 7681-1572 PT Time Calculation (min) (ACUTE ONLY): 27 min   Charges:   PT Evaluation $PT Eval Moderate Complexity: 1 Mod          West Carbo, PT, DPT   Acute Rehabilitation Department  Sandra Cockayne 03/29/2022, 2:51 PM

## 2022-03-29 NOTE — Evaluation (Signed)
Speech Language Pathology Evaluation Patient Details Name: Tanner Rice MRN: [id number] DOB: April 14, 1934 Today's Date: 03/29/2022 Time: 2505-3976 SLP Time Calculation (min) (ACUTE ONLY): 20 min  Problem List:  Patient Active Problem List   Diagnosis Date Noted   Stroke (cerebrum) ([place]) 03/28/2022   Effusion of right knee joint 03/06/2022   Pain and swelling of right knee    Knee osteoarthritis 03/03/2022   Altered mental status 10/05/2020   Constipation 05/07/2016   Protein-calorie malnutrition ([place]) 05/07/2016   Hyperglycemia 01/30/2016   Normocytic anemia 01/30/2016   Atherosclerotic peripheral vascular disease ([place]) 01/30/2016   Degenerative joint disease of spine 01/30/2016   BPH (benign prostatic hyperplasia) 01/30/2016   MRSA bacteremia    Thoracic discitis    Rheumatoid arthritis involving multiple sites with positive rheumatoid factor ([place]) 01/29/2016   Essential hypertension 01/29/2016   Left adrenal mass ([place]) 01/29/2016   Past Medical History:  Past Medical History:  Diagnosis Date   Hypertension    Melanoma ([place])    upper thigh on right leg   Rheumatoid arthritis(714.0)    Thoracic discitis    Past Surgical History:  Past Surgical History:  Procedure Laterality Date   CATARACT EXTRACTION, BILATERAL     COLONOSCOPY     hole in retina surgery     INGUINAL HERNIA REPAIR Left 02/09/2019   Procedure: LEFT INGUINAL HERNIA REPAIR WITH MESH;  Surgeon: Rolm Bookbinder, MD;  Location: [facility];  Service: General;  Laterality: Left;  GENERAL AND TAP BLOCK   INGUINAL HERNIA REPAIR Right 02/14/2022   Procedure: RIGHT INGUINAL HERNIA REPAIR;  Surgeon: Rolm Bookbinder, MD;  Location: [place] [facility];  Service: General;  Laterality: Right;   INSERTION OF MESH Right 02/14/2022   Procedure: INSERTION OF MESH;  Surgeon: Rolm Bookbinder, MD;  Location: [place];  Service: General;  Laterality: Right;   IR GENERIC HISTORICAL  04/24/2016   IR US GUIDE VASC ACCESS RIGHT 04/24/2016 Arne Cleveland, MD [facility]   IR GENERIC HISTORICAL  04/24/2016   IR FLUORO GUIDE CV LINE RIGHT 04/24/2016 Arne Cleveland, MD [facility]   TEE WITHOUT CARDIOVERSION N/A 02/02/2016   Procedure: TRANSESOPHAGEAL ECHOCARDIOGRAM (TEE);  Surgeon: Jerline Pain, MD;  Location: [facility] ENDOSCOPY;  Service: Cardiovascular;  Laterality: N/A;   TONSILLECTOMY     HPI:  Tanner Rice is an 88 y.o. male who presented secondary to a syncopal episode with slurred speech, mild aphasia and left facial droop. Tanner Rice developed worsening aphasia after initial presentation and TNK was administered. CT head and MRI brain negative for acute changes. PMH: HTN, RA, melanoma.   Assessment / Plan / Recommendation Clinical Impression  Tanner Rice participated in speech-language-cognition evaluation with this wife present. 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 Tanner Rice stated that Tanner Rice has some difficulty with memory at baseline, but that Tanner Rice writes information down to assist with this. Per the Tanner Rice and his wife, Tanner Rice's language skills are back to baseline. Tanner Rice was assessed informally as well as with components of the [place] Test and parts of the [facility] Mental Status Examination. Tanner Rice exhibited mild difficulty with memory and additional processing time and/or repetition were necessary for tasks which required executive function. However, no speech or language deficits were noted and Tanner Rice and his wife reported that the Tanner Rice's cognitive performance was  representative of his baseline. SLP suspects that the Tanner Rice's cognitive-linguistic skills are likely functional with some assistance and his use of compensatory strategies. Further acute skilled SLP services are not clinically indicated at this time. Tanner Rice, nursing, and his wife were educated regarding results  and recommendations; all parties verbalized understanding as well as agreement with plan of care.    SLP Assessment  SLP Recommendation/Assessment: Patient does not need any further [facility] SLP Visit Diagnosis: Cognitive communication deficit (R41.841)    Recommendations for follow up therapy are one component of a multi-disciplinary discharge planning process, led by the attending physician.  Recommendations may be updated based on patient status, additional functional criteria and insurance authorization.    Follow Up Recommendations  No SLP follow up    Assistance Recommended at Discharge     Functional Status Assessment Patient has had a recent decline in their functional status and demonstrates the ability to make significant improvements in function in a reasonable and predictable amount of time.  Frequency and Duration           SLP Evaluation Cognition  Overall Cognitive Status: History of cognitive impairments - at baseline Arousal/Alertness: Awake/alert Orientation Level: Oriented X4 Year: 2023 Month: October Day of Week: Correct Attention: Focused;Sustained Focused Attention: Appears intact Sustained Attention: Appears intact Memory: Impaired Memory Impairment:  (Immediate: 5/5; delayed: 4/5 with cue: 1/1) Awareness: Appears intact Problem Solving: Appears intact (money: 3/3; time: 1/1) Executive Function: Sequencing;Organizing Sequencing: Impaired Sequencing Impairment: Verbal complex (clock: 2/4) Organizing: Appears intact (backward digit span: 2/2)       Comprehension  Auditory Comprehension Overall Auditory Comprehension: Appears within functional limits for tasks assessed Yes/No Questions: Within Functional Limits (Simple and complex: 10/10) Commands: Within Functional Limits Conversation: Complex    Expression Expression Primary Mode of Expression: Verbal Verbal Expression Overall Verbal Expression: Appears within  functional limits for tasks assessed Initiation: No impairment Automatic Speech: Day of week (WNL) Level of Generative/Spontaneous Verbalization: Conversation Repetition: No impairment Naming: No impairment Confrontation:  (5/5) Divergent:  (15+ within 1 minute)   Oral / Motor  Oral Motor/Sensory Function Overall Oral Motor/Sensory Function: Within functional limits Motor Speech Overall Motor Speech: Appears within functional limits for tasks assessed Respiration: Within functional limits Phonation: Normal Resonance: Within functional limits Articulation: Within functional limitis Intelligibility: Intelligible Motor Planning: Witnin functional limits           Kyngston Pickelsimer I. Hardin Negus, [place], [place] Office number [phone]  Horton Marshall 03/29/2022, 12:48 PM

## 2022-03-29 NOTE — Progress Notes (Signed)
SLP Cancellation Note  Patient Details Name: Tanner Rice MRN: [id number] DOB: 15-Jan-1934   Cancelled treatment:       Reason Eval/Treat Not Completed: Patient at procedure or test/unavailable (Pt off unit for MRI. SLP will reattempt later as schedule allows.)  Tanner Rice I. Hardin Negus, [place], [place] Office number [phone]  Horton Marshall 03/29/2022, 10:29 AM

## 2022-03-29 NOTE — Progress Notes (Signed)
  Transition of Care [facility]) Screening Note   Patient Details  Name: Tanner Rice Date of Birth: [birth year]   [facility]) CM/SW Contact:    Benard Halsted, LCSW Phone Number: 03/29/2022, 9:40 AM    Transition of Care Department [facility]) has reviewed patient. We will continue to monitor patient advancement through interdisciplinary progression rounds. If new patient transition needs arise, please place a TOC consult.

## 2022-03-29 NOTE — Progress Notes (Signed)
PT Cancellation Note  Patient Details Name: JAVOHN BASEY MRN: [id number] DOB: Apr 05, 1934   Cancelled Treatment:    Reason Eval/Treat Not Completed: Active bedrest order remains at this time. Will continue to follow and evaluate as appropriate.   West Carbo, PT, DPT   Acute Rehabilitation Department   Sandra Cockayne 03/29/2022, 8:01 AM

## 2022-03-30 DIAGNOSIS — I6389 Other cerebral infarction: Secondary | ICD-10-CM | POA: Diagnosis not present

## 2022-03-30 DIAGNOSIS — R55 Syncope and collapse: Secondary | ICD-10-CM

## 2022-03-30 MED ORDER — ROSUVASTATIN CALCIUM 20 MG PO TABS: 20.0000 mg | ORAL_TABLET | Freq: Every day | ORAL | 1 refills | Status: AC

## 2022-03-30 MED ORDER — ASPIRIN 81 MG PO TBEC: 81.0000 mg | DELAYED_RELEASE_TABLET | Freq: Every day | ORAL | 12 refills | Status: AC

## 2022-03-30 NOTE — Progress Notes (Signed)
Received male pt from 75 [address], alert oriented not in in distress on room air per wheelchair, pt ambulated to bed, activities well tolerated, with IV cannula at left and right arm, with external catheter to wall suction, instructed to pee anytime, oriented pt to room and use of call bell, agreed to ring a bell first before going out of the bed

## 2022-03-30 NOTE — Discharge Summary (Addendum)
Stroke Discharge Summary  Patient ID: Tanner Rice   MRN: [id number]      DOB: [birth year]  Date of Admission: 03/28/2022 Date of Discharge: 03/30/2022  Attending Physician: Antony Contras  MD Consultant(s):    None  Patient's PCP:  Lajean Manes, MD  DISCHARGE DIAGNOSIS: TIA vs syncopal episode Principal Problem:   Stroke like symptoms- slurred speech, facial droop and aphasia s/p iv TNK administration Hypoglycemia Syncope   Allergies as of 03/30/2022       Reactions   Other Diarrhea, Nausea And Vomiting, Other (See Comments)   Seafood - mussels    Penicillins Cross Reactors Hives   Did it involve swelling of the face/tongue/throat, SOB, or low BP? Unknown Did it involve sudden or severe rash/hives, skin peeling, or any reaction on the inside of your mouth or nose? Unknown Did you need to seek medical attention at a hospital or doctor's office? Yes When did it last happen? over 65 years ago     If all above answers are "NO", may proceed with cephalosporin use. .        Medication List     TAKE these medications    acetaminophen 650 MG CR tablet Commonly known as: TYLENOL Take 650 mg by mouth every 8 (eight) hours as needed for pain.   alfuzosin 10 MG 24 hr tablet Commonly known as: UROXATRAL Take 10 mg by mouth every evening.   amLODipine 5 MG tablet Commonly known as: NORVASC Take 5 mg by mouth daily.   aspirin EC 81 MG tablet Take 1 tablet (81 mg total) by mouth daily. Swallow whole. Start taking on: March 31, 2022   CALCIUM 600/VITAMIN D3 PO Take 1 tablet by mouth daily.   cholecalciferol 25 MCG (1000 UNIT) tablet Commonly known as: VITAMIN D3 Take 1,000 Units by mouth in the morning and at bedtime.   diclofenac Sodium 1 % Gel Commonly known as: VOLTAREN Apply 1 Application topically 2 (two) times daily as needed (pain).   doxycycline 100 MG tablet Commonly known as: VIBRA-TABS Take 1 tablet (100 mg total) by mouth 2 (two) times daily.    Ferrex 150 150 MG capsule Generic drug: iron polysaccharides Take 150 mg by mouth daily.   finasteride 5 MG tablet Commonly known as: PROSCAR Take 5 mg by mouth daily.   folic acid 1 MG tablet Commonly known as: FOLVITE Take 1 mg by mouth daily.   golimumab 50 MG/4ML Soln injection Commonly known as: SIMPONI ARIA Inject 50 mg into the vein See admin instructions. Inject 50 mg intravenous Every 6-8 weeks per patient   lisinopril 20 MG tablet Commonly known as: ZESTRIL Take 20 mg by mouth daily.   melatonin 5 MG Tabs Take 5 mg by mouth at bedtime.   methotrexate 2.5 MG tablet Commonly known as: RHEUMATREX Take 10 mg by mouth See admin instructions. Take 10 mg twice daily every Thursday per patient   metoprolol succinate 50 MG 24 hr tablet Commonly known as: TOPROL-XL Take 1 tablet (50 mg total) by mouth daily. Take with or immediately following a meal. What changed:  when to take this additional instructions   multivitamin with minerals Tabs tablet Take 1 tablet by mouth daily.   rosuvastatin 20 MG tablet Commonly known as: CRESTOR Take 1 tablet (20 mg total) by mouth daily. Start taking on: March 31, 2022   sennosides-docusate sodium 8.6-50 MG tablet Commonly known as: SENOKOT-S Take 1 tablet by mouth daily as needed for  constipation.   traMADol 50 MG tablet Commonly known as: Ultram Take 1 tablet (50 mg total) by mouth every 8 (eight) hours as needed.   Turmeric Curcumin Caps Take 1 capsule by mouth daily.   Vitamin B12 1000 MCG Tbcr Take 1,000 mcg by mouth in the morning and at bedtime.        LABORATORY STUDIES CBC    Component Value Date/Time   WBC 6.2 03/29/2022 0427   RBC 3.41 (L) 03/29/2022 0427   HGB 11.0 (L) 03/29/2022 0427   HCT 32.0 (L) 03/29/2022 0427   PLT 163 03/29/2022 0427   MCV 93.8 03/29/2022 0427   MCH 32.3 03/29/2022 0427   MCHC 34.4 03/29/2022 0427   RDW 12.8 03/29/2022 0427   LYMPHSABS 1.5 03/28/2022 1015   MONOABS 0.7  03/28/2022 1015   EOSABS 0.1 03/28/2022 1015   BASOSABS 0.0 03/28/2022 1015   CMP    Component Value Date/Time   NA 137 03/29/2022 0427   K 4.0 03/29/2022 0427   CL 102 03/29/2022 0427   CO2 24 03/29/2022 0427   GLUCOSE 95 03/29/2022 0427   BUN 14 03/29/2022 0427   CREATININE 0.84 03/29/2022 0427   CREATININE 0.90 08/14/2021 0300   CALCIUM 9.6 03/29/2022 0427   PROT 5.5 (L) 03/29/2022 0427   ALBUMIN 3.1 (L) 03/29/2022 0427   AST 19 03/29/2022 0427   ALT 13 03/29/2022 0427   ALKPHOS 68 03/29/2022 0427   BILITOT 1.0 03/29/2022 0427   GFRNONAA >60 03/29/2022 0427   GFRAA >60 02/03/2019 1112   COAGS Lab Results  Component Value Date   INR 1.1 03/28/2022   INR 1.1 10/05/2020   Lipid Panel    Component Value Date/Time   CHOL 183 03/29/2022 0427   TRIG 90 03/29/2022 0427   HDL 62 03/29/2022 0427   CHOLHDL 3.0 03/29/2022 0427   VLDL 18 03/29/2022 0427   LDLCALC 103 (H) 03/29/2022 0427   HgbA1C  Lab Results  Component Value Date   HGBA1C 5.4 03/28/2022   Urinalysis    Component Value Date/Time   COLORURINE STRAW (A) 03/28/2022 1130   APPEARANCEUR CLEAR 03/28/2022 1130   LABSPEC 1.013 03/28/2022 1130   PHURINE 8.0 03/28/2022 1130   GLUCOSEU 50 (A) 03/28/2022 1130   HGBUR NEGATIVE 03/28/2022 1130   BILIRUBINUR NEGATIVE 03/28/2022 1130   KETONESUR NEGATIVE 03/28/2022 1130   PROTEINUR NEGATIVE 03/28/2022 1130   NITRITE NEGATIVE 03/28/2022 1130   LEUKOCYTESUR NEGATIVE 03/28/2022 1130   Urine Drug Screen     Component Value Date/Time   LABOPIA NONE DETECTED 03/28/2022 1050   COCAINSCRNUR NONE DETECTED 03/28/2022 1050   LABBENZ NONE DETECTED 03/28/2022 1050   AMPHETMU NONE DETECTED 03/28/2022 1050   THCU NONE DETECTED 03/28/2022 1050   LABBARB NONE DETECTED 03/28/2022 1050    Alcohol Level    Component Value Date/Time   ETH <10 03/24/2022 1007     SIGNIFICANT DIAGNOSTIC STUDIES MR BRAIN WO CONTRAST  Result Date: 03/29/2022 CLINICAL DATA:  Stroke,  follow up EXAM: MRI HEAD WITHOUT CONTRAST TECHNIQUE: Multiplanar, multiecho pulse sequences of the brain and surrounding structures were obtained without intravenous contrast. COMPARISON:  CT head March 28, 2022. FINDINGS: Brain: No acute infarction, acute hemorrhage, hydrocephalus, extra-axial collection or mass lesion. Mild patchy T2/FLAIR hyperintensities in the white matter, nonspecific but compatible with chronic microvascular disease. Multiple small foci susceptibility artifact in the brainstem cerebellum, basal ganglia, thalami and to lesser extent cerebral cortex, compatible with prior microhemorrhages. Vascular: Major arterial flow voids are  maintained skull base. Skull and upper cervical spine: Normal marrow signal. Sinuses/Orbits: Clear sinuses.  No acute orbital findings. Other: No mastoid effusions. IMPRESSION: 1. No evidence of acute intracranial abnormality. 2. Many prior microhemorrhages, probably secondary to chronic hypertension. 3. Chronic microvascular ischemic disease. Electronically Signed   By: Margaretha Sheffield M.D.   On: 03/29/2022 11:07   EEG adult  Result Date: 03/28/2022 Lora Havens, MD     03/28/2022  6:11 PM Patient Name: Tanner Rice MRN: [id number] Epilepsy Attending: Lora Havens Referring Physician/Provider: Winfield Rast Date: 03/28/2022 Duration: 29.54 mins Patient history: 88yo M with syncopal episode with slurred speech, mild aphasia and left facial droop. EEG to evaluate for seizure Level of alertness: Awake, asleep AEDs during EEG study: None Technical aspects: This EEG study was done with scalp electrodes positioned according to the 10-20 International system of electrode placement. Electrical activity was reviewed with band pass filter of 1-'70Hz'$ , sensitivity of 7 uV/mm, display speed of 7m/sec with a '60Hz'$  notched filter applied as appropriate. EEG data were recorded continuously and digitally stored.  Video monitoring was available and  reviewed as appropriate. Description: The posterior dominant rhythm consists of 9 Hz activity of moderate voltage (25-35 uV) seen predominantly in posterior head regions, symmetric and reactive to eye opening and eye closing. Sleep was characterized by vertex waves, maximal frontocentral region. Hyperventilation and photic stimulation were not performed.   IMPRESSION: This study is within normal limits. No seizures or epileptiform discharges were seen throughout the recording. A normal interictal EEG does not exclude the diagnosis of epilepsy. PLora Havens  ECHOCARDIOGRAM COMPLETE  Result Date: 03/28/2022    ECHOCARDIOGRAM REPORT   Patient Name:   Tanner [facility]GW[facility] Medical Management LLCDate of Exam: 03/28/2022 Medical Rec #:  [id number]     Height:       66.0 in Accession #:    [id number]    Weight:       153.9 lb Date of Birth:  207-09-1933     BSA:          1.789 m Patient Age:    89years       BP:           156/63 mmHg Patient Gender: M              HR:           50 bpm. Exam Location:  Inpatient Procedure: 2D Echo, Cardiac Doppler and Color Doppler Indications:    Stroke I63.9  History:        Patient has prior history of Echocardiogram examinations, most                 recent 10/06/2020. Stroke; Risk Factors:Hypertension.  Sonographer:    SRonny FlurryReferring Phys: 2[place] 1. Partial fusion of left and noncoronary cusps with very mild AS (peak velocity 2.4 m/s; mean gradient 9 mmHg).  2. Left ventricular ejection fraction, by estimation, is 60 to 65%. The left ventricle has normal function. The left ventricle has no regional wall motion abnormalities. There is mild concentric left ventricular hypertrophy. Left ventricular diastolic parameters are consistent with Grade I diastolic dysfunction (impaired relaxation).  3. Right ventricular systolic function is normal. The right ventricular size is normal.  4. The mitral valve is normal in structure. Trivial mitral valve regurgitation. No  evidence of mitral stenosis.  5. The aortic valve is tricuspid. Aortic valve regurgitation is not  visualized. Mild aortic valve stenosis.  6. The inferior vena cava is normal in size with greater than 50% respiratory variability, suggesting right atrial pressure of 3 mmHg. FINDINGS  Left Ventricle: Left ventricular ejection fraction, by estimation, is 60 to 65%. The left ventricle has normal function. The left ventricle has no regional wall motion abnormalities. The left ventricular internal cavity size was normal in size. There is  mild concentric left ventricular hypertrophy. Left ventricular diastolic parameters are consistent with Grade I diastolic dysfunction (impaired relaxation). Right Ventricle: The right ventricular size is normal. Right ventricular systolic function is normal. Left Atrium: Left atrial size was normal in size. Right Atrium: Right atrial size was normal in size. Pericardium: There is no evidence of pericardial effusion. Mitral Valve: The mitral valve is normal in structure. Mild mitral annular calcification. Trivial mitral valve regurgitation. No evidence of mitral valve stenosis. Tricuspid Valve: The tricuspid valve is normal in structure. Tricuspid valve regurgitation is mild . No evidence of tricuspid stenosis. Aortic Valve: The aortic valve is tricuspid. Aortic valve regurgitation is not visualized. Mild aortic stenosis is present. Aortic valve mean gradient measures 7.5 mmHg. Aortic valve peak gradient measures 16.0 mmHg. Aortic valve area, by VTI measures 1.98 cm. Pulmonic Valve: The pulmonic valve was normal in structure. Pulmonic valve regurgitation is trivial. No evidence of pulmonic stenosis. Aorta: The aortic root is normal in size and structure. Venous: The inferior vena cava is normal in size with greater than 50% respiratory variability, suggesting right atrial pressure of 3 mmHg. IAS/Shunts: No atrial level shunt detected by color flow Doppler. Additional Comments: Partial  fusion of left and noncoronary cusps with very mild AS (peak velocity 2.4 m/s; mean gradient 9 mmHg).  LEFT VENTRICLE PLAX 2D LVIDd:         5.00 cm   Diastology LVIDs:         3.80 cm   LV e' medial:    7.46 cm/s LV PW:         1.10 cm   LV E/e' medial:  7.5 LV IVS:        1.20 cm   LV e' lateral:   7.77 cm/s LVOT diam:     2.10 cm   LV E/e' lateral: 7.2 LV SV:         89 LV SV Index:   50 LVOT Area:     3.46 cm  RIGHT VENTRICLE RV S prime:     13.40 cm/s TAPSE (M-mode): 2.6 cm LEFT ATRIUM           Index        RIGHT ATRIUM           Index LA diam:      3.80 cm 2.12 cm/m   RA Area:     11.90 cm LA Vol (A2C): 60.6 ml 33.87 ml/m  RA Volume:   23.10 ml  12.91 ml/m LA Vol (A4C): 34.8 ml 19.45 ml/m  AORTIC VALVE AV Area (Vmax):    1.85 cm AV Area (Vmean):   2.00 cm AV Area (VTI):     1.98 cm AV Vmax:           200.00 cm/s AV Vmean:          118.500 cm/s AV VTI:            0.447 m AV Peak Grad:      16.0 mmHg AV Mean Grad:      7.5 mmHg LVOT Vmax:  107.00 cm/s LVOT Vmean:        68.400 cm/s LVOT VTI:          0.256 m LVOT/AV VTI ratio: 0.57  AORTA Ao Root diam: 3.60 cm Ao Asc diam:  3.20 cm MITRAL VALVE               TRICUSPID VALVE MV Area (PHT): 2.42 cm    TR Peak grad:   28.5 mmHg MV Decel Time: 313 msec    TR Vmax:        267.00 cm/s MV E velocity: 56.30 cm/s MV A velocity: 88.10 cm/s  SHUNTS MV E/A ratio:  0.64        Systemic VTI:  0.26 m                            Systemic Diam: 2.10 cm Kirk Ruths MD Electronically signed by Kirk Ruths MD Signature Date/Time: 03/28/2022/3:05:10 PM    Final    DG Chest Portable 1 View  Result Date: 03/28/2022 CLINICAL DATA:  Syncope EXAM: PORTABLE CHEST 1 VIEW COMPARISON:  06/29/2021 FINDINGS: There is interval increase in transverse diameter of hot. This may be partly due to poor inspiration and semi upright position. Thoracic aorta is ectatic. There is peribronchial thickening. There is no focal consolidation. There is no pleural effusion or  pneumothorax. Old healed fracture is seen in the posterior left sixth rib. IMPRESSION: Cardiomegaly. Peribronchial thickening suggests bronchitis. There is no focal pulmonary consolidation or signs of alveolar pulmonary edema. Electronically Signed   By: Elmer Picker M.D.   On: 03/28/2022 10:46   CT ANGIO HEAD NECK W WO CM (CODE STROKE)  Result Date: 03/28/2022 CLINICAL DATA:  Neuro deficit, acute, stroke suspected EXAM: CT ANGIOGRAPHY HEAD AND NECK TECHNIQUE: Multidetector CT imaging of the head and neck was performed using the standard protocol during bolus administration of intravenous contrast. Multiplanar CT image reconstructions and MIPs were obtained to evaluate the vascular anatomy. Carotid stenosis measurements (when applicable) are obtained utilizing NASCET criteria, using the distal internal carotid diameter as the denominator. RADIATION DOSE REDUCTION: This exam was performed according to the departmental dose-optimization program which includes automated exposure control, adjustment of the mA and/or kV according to patient size and/or use of iterative reconstruction technique. CONTRAST:  23m OMNIPAQUE IOHEXOL 350 MG/ML SOLN COMPARISON:  Same day CT head.  CTA head/neck 02/04/2021. FINDINGS: CTA NECK FINDINGS Aortic arch: Great vessel origins are patent without significant stenosis. Right carotid system: No evidence of dissection, stenosis (50% or greater), or occlusion. Left carotid system: No evidence of dissection, stenosis (50% or greater), or occlusion. Atherosclerosis involving the proximal ICA without greater than 50% narrowing. Vertebral arteries: Patent bilaterally. Severe stenosis of the left vertebral artery at the C1-C2 level due to mass effect from bulky osteophytes at this level (for example see series 7, image 174). Skeleton: Severe multilevel degenerative change clipping bulky osteophytes on the left at C1-C2. Other neck: No acute findings. Upper chest: Visualized lung apices  are clear. Review of the MIP images confirms the above findings CTA HEAD FINDINGS Anterior circulation: Bilateral intracranial ICAs are patent with mild narrowing due to calcific atherosclerosis. Bilateral MCAs and ACAs are patent without proximal hemodynamically significant stenosis. Posterior circulation: Bilateral intradural vertebral arteries, basilar artery and bilateral posterior cerebral arteries are patent without proximal hemodynamically significant stenosis. Venous sinuses: As permitted by contrast timing, patent. Review of the MIP images confirms the above findings IMPRESSION: 1.  No emergent large vessel occlusion. 2. Severe stenosis of the left vertebral artery at the C1-C2 level due to mass effect from bulky osteophytes at this level due to severe degenerative change. Electronically Signed   By: Margaretha Sheffield M.D.   On: 03/28/2022 10:45   CT HEAD CODE STROKE WO CONTRAST  Result Date: 03/28/2022 CLINICAL DATA:  Code stroke.  Neuro deficit, acute, stroke suspected EXAM: CT HEAD WITHOUT CONTRAST TECHNIQUE: Contiguous axial images were obtained from the base of the skull through the vertex without intravenous contrast. RADIATION DOSE REDUCTION: This exam was performed according to the departmental dose-optimization program which includes automated exposure control, adjustment of the mA and/or kV according to patient size and/or use of iterative reconstruction technique. COMPARISON:  CT head October 05, 2020. FINDINGS: Brain: No evidence of acute large vascular territory infarction, hemorrhage, hydrocephalus, extra-axial collection or mass lesion/mass effect. Similar patchy white matter hypodensities, nonspecific but compatible with chronic microvascular ischemic disease. Prior left thalamic lacunar infarct. Vascular: No hyperdense vessel identified.  Calcific atherosclerosis Skull: No acute fracture. Sinuses/Orbits: Clear sinuses.  No acute orbital findings. Other: No mastoid effusions. ASPECTS  [facility] Stroke Program Early CT Score) total score (0-10 with 10 being normal): 10. IMPRESSION: No evidence of acute intracranial abnormality.  ASPECTS is 10. Code stroke imaging results were communicated on 03/28/2022 at 10:21 am to provider Dr. Leonel Ramsay via secure text paging. Electronically Signed   By: Margaretha Sheffield M.D.   On: 03/28/2022 10:21   DG Shoulder Left Port  Result Date: 03/03/2022 CLINICAL DATA:  Left shoulder and knee pain EXAM: LEFT SHOULDER three views COMPARISON:  None Available. FINDINGS: No evidence of fracture or malalignment. The humeral head is located. Degenerative osteoarthritis present at the acromioclavicular joint with a downward directed osteophyte arising from the acromion process. Remote healed left rib fracture. No lytic or blastic osseous lesion. IMPRESSION: 1. Degenerative osteoarthritis at the left glenohumeral and acromioclavicular joints. 2. No acute fracture or malalignment. Electronically Signed   By: Jacqulynn Cadet M.D.   On: 03/03/2022 11:04   DG Knee Complete 4 Views Left  Result Date: 03/03/2022 CLINICAL DATA:  Left knee pain beginning yesterday. No known injury. EXAM: LEFT KNEE - COMPLETE 4+ VIEW COMPARISON:  None Available. FINDINGS: No evidence of fracture, dislocation, or joint effusion. Mild chondrocalcinosis is noted without other signs of arthropathy. No focal bone lesions identified. Peripheral vascular calcification noted. IMPRESSION: No acute findings. Mild chondrocalcinosis. Electronically Signed   By: Marlaine Hind M.D.   On: 03/03/2022 11:03   DG Knee Right Port  Result Date: 03/02/2022 CLINICAL DATA:  Generalized pain, no reported injury EXAM: PORTABLE RIGHT KNEE - 1-2 VIEW COMPARISON:  None Available. FINDINGS: No fracture or dislocation. Apparent moderate suprapatellar right knee joint effusion on the limited slightly obliqued cross-table lateral view. Mild tricompartmental right knee osteoarthritis with meniscal chondrocalcinosis. No  suspicious focal osseous lesions. No radiopaque foreign bodies. IMPRESSION: Apparent moderate suprapatellar right knee joint effusion. No fracture or malalignment. Mild tricompartmental right knee osteoarthritis with meniscal chondrocalcinosis, suggesting CPPD arthropathy. Electronically Signed   By: Ilona Sorrel M.D.   On: 03/02/2022 15:47      HISTORY OF PRESENT ILLNESS Tanner Rice is a 88 y.o. male with history of HTN, RA, melanoma presenting with lightheadedness at work and then fell, found to have left facial droop, dysarthria, and aphasia which have since resolved. He is currently bradycardic. We have held his metoprolol and recommend he follows up with his PCP to re-evaluate  the need for it. Also ordered orthostatic vitals as he presentation could have been a syncopal episode from bradycardia or hypotension as well. Also found to have hypoglycemia s/p D50. Family has declined outpatient PT referral.   HOSPITAL COURSE Stroke like symptoms s/p TNK syncopal vs hypoglycemic episode vs. TIA  Code Stroke CT head No acute abnormality. ASPECTS 10.    CTA head & neck stenosis of the left vertebral artery at the C1-C2 level due to mass effect from bulky osteophytes at this level due to severe degenerative change. MRI  No evidence of acute intracranial abnormality EEG- No epileptogenic or seizure activity noted on EEG  2D Echo EF 60-65%  LDL 103 HgbA1c 5.4 VTE prophylaxis - SCDs No antithrombotic prior to admission, now on aspirin 81 mg daily.  Therapy recommendations:  No follow up Disposition:  Pending    ? Syncope with orthostasis ? Hypoglycemia  ? Symptomatic bradycardia  Standing at work and felt lightheadedness with fall Orthostatic vital pending Glucose initially 143 but repeat was 66 s/p 1/2 D50 then up to 86 Bradycardia and wife stated that he is on metoprolol with known bradycardia Hold metoprolol now and follow up with PCP Dr. Felipa Eth   Hx of encephalopathy 09/2020 -  episode of aphasia and generalized weakness. Nonfocal exam. Concerning for encephalopathy. Work up unrevealing and imaging and EEG negative for an acute process and it was determined that he had an episode of transient confusion.    Hypertension Home meds:  lisinopril, metoprolol succinate Stable Long-term BP goal normotensive   Hyperlipidemia Home meds:  none, resumed in hospital LDL 103, goal < 70 Add rosuvastatin '20mg'$   Continue statin at discharge   Other Stroke Risk Factors Advanced Age >/= 20   RN Pressure Injury Documentation:     DISCHARGE EXAM Blood pressure (!) 160/88, pulse (!) 51, temperature 98 F (36.7 C), temperature source Oral, resp. rate 18, height '5\' 6"'$  (1.676 m), weight 69.8 kg, SpO2 99 %.  Physical Exam  Constitutional: Appears well-developed and well-nourished.   Cardiovascular: Normal rate and regular rhythm.  Respiratory: Effort normal, non-labored breathing   Neuro: Mental Status: Patient is awake, alert, oriented to person, place, month, year, and situation. Patient is able to give a clear and coherent history. No signs of aphasia or neglect Cranial Nerves: II: Visual Fields are full. Pupils are equal, round, and reactive to light.   III,IV, VI: EOMI without ptosis or diploplia.  V: Facial sensation is symmetric to temperature VII: Facial movement is symmetric resting and smiling VIII: Hearing is intact to voice X: Palate elevates symmetrically XI: Shoulder shrug is symmetric. XII: Tongue protrudes midline without atrophy or fasciculations.  Motor: Tone is normal. Bulk is normal. 5/5 strength was present in all four extremities.  Sensory: Sensation is symmetric to light touch and temperature in the arms and legs. No extinction to DSS present.  Cerebellar: FNF and HKS are intact bilaterally  Discharge Diet       Diet   Diet Heart Room service appropriate? Yes with Assist; Fluid consistency: Thin   liquids  DISCHARGE PLAN Disposition:   Home  aspirin 81 mg daily for secondary stroke prevention f Ongoing stroke risk factor control by Primary Care Physician at time of discharge Follow-up PCP Lajean Manes, MD in 2 weeks.  34 minutes were spent preparing discharge.  Patient seen and examined by NP/APP with MD. MD to update note as needed.   Janine Ores, DNP, FNP-BC [facility] Pager: [phone]  I  have personally obtained history,examined this patient, reviewed notes, independently viewed imaging studies, participated in medical decision making and plan of care.ROS completed by me personally and pertinent positives fully documented  I have made any additions or clarifications directly to the above note. Agree with note above.    Antony Contras, MD Medical Director [facility] Pager: [phone] 03/30/2022 2:22 PM

## 2022-03-30 NOTE — Plan of Care (Signed)

## 2022-03-30 NOTE — Progress Notes (Signed)
Occupational Therapy Treatment Patient Details Name: Tanner Rice MRN: [id number] DOB: Aug 29, 1933 Today's Date: 03/30/2022   History of present illness 88 yo male presenting 10/19 after syncopal episode at work with slurred speech, aphasia, and L facial droop. CT showed no acute abnormality, MRI pending. PMH includes: HTN, melanoma, rheumatoid arthritis, and thoracic discitis.   OT comments  Pt progressing towards goals, completing standing grooming task and simulated toilet transfer with min guard A without AD. Pt with no LOB noted this session. Pt mod I for bed mobility, VSS with BP 159/62 (90) and HR 52 post mobility. Pt presenting with impairments listed below, will follow acutely. Recommend d/c home with family assistance.   Recommendations for follow up therapy are one component of a multi-disciplinary discharge planning process, led by the attending physician.  Recommendations may be updated based on patient status, additional functional criteria and insurance authorization.    Follow Up Recommendations  No OT follow up    Assistance Recommended at Discharge PRN  Patient can return home with the following  Assist for transportation   Equipment Recommendations  None recommended by OT    Recommendations for Other Services      Precautions / Restrictions Precautions Precautions: Fall Restrictions Weight Bearing Restrictions: No       Mobility Bed Mobility Overal bed mobility: Modified Independent                  Transfers Overall transfer level: Modified independent Equipment used: None                     Balance Overall balance assessment: Mild deficits observed, not formally tested                                         ADL either performed or assessed with clinical judgement   ADL Overall ADL's : Needs assistance/impaired     Grooming: Min guard;Standing;Oral care Grooming Details (indicate cue type and reason): standing  at sink                 Toilet Transfer: Min guard;Ambulation;Regular Glass blower/designer Details (indicate cue type and reason): simulated via functional mobility         Functional mobility during ADLs: Min guard      Extremity/Trunk Assessment Upper Extremity Assessment Upper Extremity Assessment: Generalized weakness   Lower Extremity Assessment Lower Extremity Assessment: Defer to PT evaluation        Vision       Perception Perception Perception: Not tested   Praxis Praxis Praxis: Not tested    Cognition Arousal/Alertness: Awake/alert Behavior During Therapy: WFL for tasks assessed/performed Overall Cognitive Status: Within Functional Limits for tasks assessed                                          Exercises      Shoulder Instructions       General Comments BP 159/62 (90) HR 52 post-mobility    Pertinent Vitals/ Pain       Pain Assessment Pain Assessment: No/denies pain  Home Living  Prior Functioning/Environment              Frequency  Min 2X/week        Progress Toward Goals  OT Goals(current goals can now be found in the care plan section)  Progress towards OT goals: Progressing toward goals  Acute Rehab OT Goals Patient Stated Goal: none stated OT Goal Formulation: With patient Time For Goal Achievement: 04/12/22 Potential to Achieve Goals: Good ADL Goals Pt Will Perform Upper Body Bathing: Independently;standing Pt Will Perform Lower Body Bathing: Independently;sit to/from stand Additional ADL Goal #1: pt will complete DGI with a score >19 to indicate decrease fall with adls  Plan Discharge plan remains appropriate;Frequency remains appropriate    Co-evaluation                 AM-PAC OT "6 Clicks" Daily Activity     Outcome Measure   Help from another person eating meals?: None Help from another person taking care of personal  grooming?: None Help from another person toileting, which includes using toliet, bedpan, or urinal?: A Little Help from another person bathing (including washing, rinsing, drying)?: A Little Help from another person to put on and taking off regular upper body clothing?: A Little Help from another person to put on and taking off regular lower body clothing?: A Little 6 Click Score: 20    End of Session Equipment Utilized During Treatment: Gait belt  OT Visit Diagnosis: Unsteadiness on feet (R26.81);Muscle weakness (generalized) (M62.81)   Activity Tolerance Patient tolerated treatment well   Patient Left in chair;with call bell/phone within reach;with chair alarm set   Nurse Communication Mobility status        Time: 5621-3086 OT Time Calculation (min): 25 min  Charges: OT General Charges $OT Visit: 1 Visit OT Treatments $Self Care/Home Management : 8-22 mins $Therapeutic Activity: 8-22 mins  Lynnda Child, OTD, OTR/L Acute Rehab [phone]) [phone] - [place] 03/30/2022, 9:49 AM

## 2022-04-10 DIAGNOSIS — R972 Elevated prostate specific antigen [PSA]: Secondary | ICD-10-CM | POA: Diagnosis not present

## 2022-04-25 NOTE — Progress Notes (Signed)
Chief Complaint  Patient presents with   New Patient (Initial Visit)    Syncope   History of Present Illness 88 yo male with history of HTN, rheumatoid arthritis and melanoma who is here today as a new patient for the evaluation of syncope. He was admitted to [facility] 03/28/22 after becoming lightheaded at work, he became weak and fell. He does not think he passed out. He was found to have left facial droop. Code Stroke called and he was given emergent TNK. His blood sugar level was low. He was evaluated by the Neurology team while admitted and no evidence of CVA on brain MRI. He was bradycardic during his admission and his beta blocker was held. Echo 03/28/22 with LVEF=60-65%. Trivial MR. Mild aortic stenosis (mean gradient 9 mmHg). Question of whether or not he may have had syncope from bradycardia or heart block. Neck CTA on 03/28/22 with mild to moderate LICA stenosis, no RICA stenosis.   He tells me today that he feels well overall. No recurrent dizziness or near syncope. Rare chest pains. No dyspnea or LE edema.   Primary Care Physician: Kathalene Frames, MD   Past Medical History:  Diagnosis Date   Aortic stenosis    Hypertension    Melanoma ([place])    upper thigh on right leg   Rheumatoid arthritis(714.0)    Thoracic discitis    Past Surgical History:  Procedure Laterality Date   CATARACT EXTRACTION, BILATERAL     COLONOSCOPY     hole in retina surgery     INGUINAL HERNIA REPAIR Left 02/09/2019   Procedure: LEFT INGUINAL HERNIA REPAIR WITH MESH;  Surgeon: Rolm Bookbinder, MD;  Location: [facility];  Service: General;  Laterality: Left;  GENERAL AND TAP BLOCK   INGUINAL HERNIA REPAIR Right 02/14/2022   Procedure: RIGHT INGUINAL HERNIA REPAIR;  Surgeon: Rolm Bookbinder, MD;  Location: [facility];  Service: General;  Laterality: Right;   INSERTION OF MESH Right 02/14/2022   Procedure: INSERTION OF MESH;  Surgeon: Rolm Bookbinder, MD;  Location: [facility];  Service: General;  Laterality:  Right;   IR GENERIC HISTORICAL  04/24/2016   IR US GUIDE VASC ACCESS RIGHT 04/24/2016 Arne Cleveland, MD [facility]INTER[facility]   IR GENERIC HISTORICAL  04/24/2016   IR FLUORO GUIDE CV LINE RIGHT 04/24/2016 Arne Cleveland, MD [facility]   TEE WITHOUT CARDIOVERSION N/A 02/02/2016   Procedure: TRANSESOPHAGEAL ECHOCARDIOGRAM (TEE);  Surgeon: Jerline Pain, MD;  Location: [facility] ENDOSCOPY;  Service: Cardiovascular;  Laterality: N/A;   TONSILLECTOMY      Current Outpatient Medications  Medication Sig Dispense Refill   acetaminophen (TYLENOL) 650 MG CR tablet Take 650 mg by mouth every 8 (eight) hours as needed for pain.     alfuzosin (UROXATRAL) 10 MG 24 hr tablet Take 10 mg by mouth every evening.     amLODipine (NORVASC) 5 MG tablet Take 5 mg by mouth daily.     aspirin EC 81 MG tablet Take 1 tablet (81 mg total) by mouth daily. Swallow whole. 30 tablet 12   Calcium Carb-Cholecalciferol (CALCIUM 600/VITAMIN D3 PO) Take 1 tablet by mouth daily.     cholecalciferol (VITAMIN D) 25 MCG (1000 UT) tablet Take 1,000 Units by mouth in the morning and at bedtime.     Cyanocobalamin (VITAMIN B12) 1000 MCG TBCR Take 1,000 mcg by mouth in the morning and at bedtime.     diclofenac Sodium (VOLTAREN) 1 % GEL Apply 1 Application topically 2 (two) times daily  as needed (pain).     doxycycline (VIBRA-TABS) 100 MG tablet Take 1 tablet (100 mg total) by mouth 2 (two) times daily. 60 tablet 11   finasteride (PROSCAR) 5 MG tablet Take 5 mg by mouth daily.     folic acid (FOLVITE) 1 MG tablet Take 1 mg by mouth daily.     golimumab (SIMPONI ARIA) 50 MG/4ML SOLN injection Inject 50 mg into the vein See admin instructions. Inject 50 mg intravenous Every 6-8 weeks per patient     iron polysaccharides (FERREX 150) 150 MG capsule Take 150 mg by mouth daily.     lisinopril (ZESTRIL) 20 MG tablet Take 20 mg by mouth daily.     Melatonin 5 MG TABS Take 5 mg by mouth at bedtime.     methotrexate (RHEUMATREX) 2.5 MG tablet Take 10 mg  by mouth See admin instructions. Take 10 mg twice daily every Thursday per patient     metoprolol succinate (TOPROL-XL) 50 MG 24 hr tablet Take 25 mg by mouth in the morning and at bedtime.     Misc Natural Products (TURMERIC CURCUMIN) CAPS Take 1 capsule by mouth daily.     Multiple Vitamin (MULTIVITAMIN WITH MINERALS) TABS tablet Take 1 tablet by mouth daily.     rosuvastatin (CRESTOR) 20 MG tablet Take 1 tablet (20 mg total) by mouth daily. 30 tablet 1   sennosides-docusate sodium (SENOKOT-S) 8.6-50 MG tablet Take 1 tablet by mouth daily as needed for constipation.     traMADol (ULTRAM) 50 MG tablet Take 1 tablet (50 mg total) by mouth every 8 (eight) hours as needed. 20 tablet 0   No current facility-administered medications for this visit.    Allergies  Allergen Reactions   Other Diarrhea, Nausea And Vomiting and Other (See Comments)    Seafood - mussels    Penicillins Cross Reactors Hives    Did it involve swelling of the face/tongue/throat, SOB, or low BP? Unknown Did it involve sudden or severe rash/hives, skin peeling, or any reaction on the inside of your mouth or nose? Unknown Did you need to seek medical attention at a hospital or doctor's office? Yes When did it last happen? over 65 years ago     If all above answers are "NO", may proceed with cephalosporin use. .    Social History   Socioeconomic History   Marital status: Married    Spouse name: Not on file   Number of children: 2   Years of education: Not on file   Highest education level: Not on file  Occupational History   Occupation: Retired-management in retail  Tobacco Use   Smoking status: Former    Types: Pipe, Landscape architect, Cigarettes   Smokeless tobacco: Never   Tobacco comments:    quit 42 yrs. ago  Vaping Use   Vaping Use: Never used  Substance and Sexual Activity   Alcohol use: Yes    Comment: 4x week glass wine   Drug use: No   Sexual activity: Not on file  Other Topics Concern   Not on file   Social History Narrative   Not on file   Social Determinants of Health   Financial Resource Strain: Not on file  Food Insecurity: No Food Insecurity (03/28/2022)   Hunger Vital Sign    Worried About Running Out of Food in the Last Year: Never true    Ran Out of Food in the Last Year: Never true  Transportation Needs: No Transportation Needs (03/28/2022)  PRAPARE - Hydrologist (Medical): No    Lack of Transportation (Non-Medical): No  Physical Activity: Not on file  Stress: Not on file  Social Connections: Not on file  Intimate Partner Violence: Not At Risk (03/28/2022)   Humiliation, Afraid, Rape, and Kick questionnaire    Fear of Current or Ex-Partner: No    Emotionally Abused: No    Physically Abused: No    Sexually Abused: No    Family History  Problem Relation Age of Onset   Prostate cancer Father    Prostate cancer Brother    Osteoarthritis Brother     Review of Systems:  As stated in the HPI and otherwise negative.   BP 130/70   Pulse 62   Ht '5\' 6"'$  (1.676 m)   Wt 151 lb (68.5 kg)   SpO2 98%   BMI 24.37 kg/m   Physical Examination: General: Well developed, well nourished, NAD  HEENT: OP clear, mucus membranes moist  SKIN: warm, dry. No rashes. Neuro: No focal deficits  Musculoskeletal: Muscle strength 5/5 all ext  Psychiatric: Mood and affect normal  Neck: No JVD, no carotid bruits, no thyromegaly, no lymphadenopathy.  Lungs:Clear bilaterally, no wheezes, rhonci, crackles Cardiovascular: Regular rate and rhythm. No murmurs, gallops or rubs. Abdomen:Soft. Bowel sounds present. Non-tender.  Extremities: No lower extremity edema. Pulses are 2 + in the bilateral DP/PT.  EKG:  EKG is not ordered today. The ekg ordered today demonstrates   Echo 03/28/22:  1. Partial fusion of left and noncoronary cusps with very mild AS (peak  velocity 2.4 m/s; mean gradient 9 mmHg).   2. Left ventricular ejection fraction, by estimation, is  60 to 65%. The  left ventricle has normal function. The left ventricle has no regional  wall motion abnormalities. There is mild concentric left ventricular  hypertrophy. Left ventricular diastolic  parameters are consistent with Grade I diastolic dysfunction (impaired  relaxation).   3. Right ventricular systolic function is normal. The right ventricular  size is normal.   4. The mitral valve is normal in structure. Trivial mitral valve  regurgitation. No evidence of mitral stenosis.   5. The aortic valve is tricuspid. Aortic valve regurgitation is not  visualized. Mild aortic valve stenosis.   6. The inferior vena cava is normal in size with greater than 50%  respiratory variability, suggesting right atrial pressure of 3 mmHg.   Recent Labs: 03/05/2022: Magnesium 1.7 03/29/2022: ALT 13; BUN 14; Creatinine, Ser 0.84; Hemoglobin 11.0; Platelets 163; Potassium 4.0; Sodium 137   Lipid Panel    Component Value Date/Time   CHOL 183 03/29/2022 0427   TRIG 90 03/29/2022 0427   HDL 62 03/29/2022 0427   CHOLHDL 3.0 03/29/2022 0427   VLDL 18 03/29/2022 0427   LDLCALC 103 (H) 03/29/2022 0427     Wt Readings from Last 3 Encounters:  04/26/22 151 lb (68.5 kg)  03/28/22 153 lb 14.1 oz (69.8 kg)  03/02/22 150 lb (68 kg)    Assessment and Plan:   1. Syncope: Unclear etiology. Neck CTA without evidence of obstructive CAD or carotid dissection. Echo with normal LV function. Mild AS but this would not cause his syncope. No evidence of a CVA on MRI. ( He did receive TNK in the ED). Will arrange a 14 day cardiac monitor to exclude bradycardia/heart block.   Labs/ tests ordered today include:   Orders Placed This Encounter  Procedures   LONG TERM MONITOR (3-14 DAYS)   Disposition:  F/U with me in 4-6 weeks.   Signed, Lauree Chandler, MD, [facility] 04/26/2022 4:40 PM    [facility] [address], [place], [place]  [address] Phone: [phone]; Fax: [phone]

## 2022-04-26 ENCOUNTER — Encounter: Payer: Self-pay | Admitting: Cardiovascular Disease

## 2022-04-26 ENCOUNTER — Ambulatory Visit (INDEPENDENT_AMBULATORY_CARE_PROVIDER_SITE_OTHER): Payer: Medicare Other

## 2022-04-26 ENCOUNTER — Ambulatory Visit: Payer: Medicare Other | Attending: Cardiovascular Disease | Admitting: Cardiovascular Disease

## 2022-04-26 VITALS — BP 130/70 | HR 62 | Ht 66.0 in | Wt 151.0 lb

## 2022-04-26 DIAGNOSIS — R55 Syncope and collapse: Secondary | ICD-10-CM

## 2022-04-26 DIAGNOSIS — I2510 Atherosclerotic heart disease of native coronary artery without angina pectoris: Secondary | ICD-10-CM

## 2022-04-26 NOTE — Progress Notes (Unsigned)
Applied a 14 day Zio XT monitor to patient in the office ?

## 2022-04-26 NOTE — Patient Instructions (Addendum)
Medication Instructions:  No changes *If you need a refill on your cardiac medications before your next appointment, please call your pharmacy*   Lab Work: None If you have labs (blood work) drawn today and your tests are completely normal, you will receive your results only by: [facility] (if you have MyChart) OR A paper copy in the mail If you have any lab test that is abnormal or we need to change your treatment, we will call you to review the results.   Testing/Procedures: Zio Heart Monitor. See instructions below.    Follow-Up: At [facility], you and your health needs are our priority.  As part of our continuing mission to provide you with exceptional heart care, we have created designated Provider Care Teams.  These Care Teams include your primary Cardiologist (physician) and Advanced Practice Providers (APPs -  Physician Assistants and Nurse Practitioners) who all work together to provide you with the care you need, when you need it.  We recommend signing up for the patient portal called "MyChart".  Sign up information is provided on this After Visit Summary.  MyChart is used to connect with patients for Virtual Visits (Telemedicine).  Patients are able to view lab/test results, encounter notes, upcoming appointments, etc.  Non-urgent messages can be sent to your provider as well.   To learn more about what you can do with MyChart, go to [email or web address].     Your next appointment is Dec. 20, 2023 at 3:20 PM.  Other Instructions: ZIO XT- Long Term Monitor Instructions  Your physician has requested you wear a ZIO patch monitor for 14 days.  This is a single patch monitor. [employer] supplies one patch monitor per enrollment. Additional stickers are not available. Please do not apply patch if you will be having a Nuclear Stress Test,  Echocardiogram, Cardiac CT, MRI, or Chest Xray during the period you would be wearing the  monitor. The patch cannot be worn  during these tests. You cannot remove and re-apply the  ZIO XT patch monitor.  Your ZIO patch monitor will be mailed 3 day USPS to your address on file. It may take 3-5 days  to receive your monitor after you have been enrolled.  Once you have received your monitor, please review the enclosed instructions. Your monitor  has already been registered assigning a specific monitor serial # to you.  Billing and Patient Assistance Program Information  We have supplied [employer] with any of your insurance information on file for billing purposes. [employer] offers a sliding scale Patient Assistance Program for patients that do not have  insurance, or whose insurance does not completely cover the cost of the ZIO monitor.  You must apply for the Patient Assistance Program to qualify for this discounted rate.  To apply, please call [employer] at [phone], select option 4, select option 2, ask to apply for  Patient Assistance Program. Theodore Demark will ask your household income, and how many people  are in your household. They will quote your out-of-pocket cost based on that information.  [employer] will also be able to set up a 47-month interest-free payment plan if needed.  Applying the monitor   Shave hair from upper left chest.  Hold abrader disc by orange tab. Rub abrader in 40 strokes over the upper left chest as  indicated in your monitor instructions.  Clean area with 4 enclosed alcohol pads. Let dry.  Apply patch as indicated in monitor instructions. Patch will be placed under collarbone on left  side of chest with arrow pointing upward.  Rub patch adhesive wings for 2 minutes. Remove white label marked "1". Remove the white  label marked "2". Rub patch adhesive wings for 2 additional minutes.  While looking in a mirror, press and release button in center of patch. A small green light will  flash 3-4 times. This will be your only indicator that the monitor has been turned on.  Do not shower for the  first 24 hours. You may shower after the first 24 hours.  Press the button if you feel a symptom. You will hear a small click. Record Date, Time and  Symptom in the Patient Logbook.  When you are ready to remove the patch, follow instructions on the last 2 pages of Patient  Logbook. Stick patch monitor onto the last page of Patient Logbook.  Place Patient Logbook in the blue and white box. Use locking tab on box and tape box closed  securely. The blue and white box has prepaid postage on it. Please place it in the mailbox as  soon as possible. Your physician should have your test results approximately 7 days after the  monitor has been mailed back to [facility].  Call [facility] at [phone] if you have questions regarding  your ZIO XT patch monitor. Call them immediately if you see an orange light blinking on your  monitor.  If your monitor falls off in less than 4 days, contact our Monitor department at [phone].  If your monitor becomes loose or falls off after 4 days call [employer] at [phone] for  suggestions on securing your monitor    Important Information About Sugar

## 2022-05-14 DIAGNOSIS — M0579 Rheumatoid arthritis with rheumatoid factor of multiple sites without organ or systems involvement: Secondary | ICD-10-CM | POA: Diagnosis not present

## 2022-05-17 DIAGNOSIS — R55 Syncope and collapse: Secondary | ICD-10-CM | POA: Diagnosis not present

## 2022-05-23 ENCOUNTER — Telehealth: Payer: Self-pay | Admitting: Cardiovascular Disease

## 2022-05-23 DIAGNOSIS — M0579 Rheumatoid arthritis with rheumatoid factor of multiple sites without organ or systems involvement: Secondary | ICD-10-CM | POA: Diagnosis not present

## 2022-05-23 DIAGNOSIS — D649 Anemia, unspecified: Secondary | ICD-10-CM | POA: Diagnosis not present

## 2022-05-23 DIAGNOSIS — Z79899 Other long term (current) drug therapy: Secondary | ICD-10-CM | POA: Diagnosis not present

## 2022-05-23 NOTE — Telephone Encounter (Signed)
Patient is returning call to discuss monitor results. 

## 2022-05-23 NOTE — Telephone Encounter (Signed)
Pt called back and I spoke w him.  Reviewed monitor results.  He said he had a fall when they came back from the beach.  It was going up steps and he was carrying things and missed the edge of the step.  There was no dizziness or syncope.  Will keep is follow up with Dr. Angelena Form next week.

## 2022-05-23 NOTE — Telephone Encounter (Signed)
Returned call to patient.  Left another message for him to call back.  Adv to let me know if okay to leave detailed message on his VM.

## 2022-05-28 NOTE — Progress Notes (Unsigned)
No chief complaint on file.  History of Present Illness 88 yo male with history of HTN, rheumatoid arthritis and melanoma who is here today for follow up. I saw him as a new patient for the evaluation of syncope on 04/1722. He was admitted to [facility] 03/28/22 after becoming lightheaded at work. He became weak and fell. He does not think he passed out. He was found to have left facial droop. Code Stroke called and he was given emergent TNK. His blood sugar level was low. He was evaluated by the Neurology team while admitted and no evidence of CVA on brain MRI. He was bradycardic during his admission and his beta blocker was held. Echo 03/28/22 with LVEF=60-65%. Trivial MR. Mild aortic stenosis (mean gradient 9 mmHg). Question of whether or not he may have had syncope from bradycardia or heart block. Neck CTA on 03/28/22 with mild to moderate LICA stenosis, no RICA stenosis. Cardiac monitor November 2023 with sinus with lowest heart rate 36 bpm, first degree AV block, bundle branch block, 5 beat run of VT and 18 runs of SVT.   He is here today for follow up. The patient denies any chest pain, dyspnea, palpitations, lower extremity edema, orthopnea, PND, dizziness, near syncope or syncope.   Primary Care Physician: Kathalene Frames, MD   Past Medical History:  Diagnosis Date   Aortic stenosis    Hypertension    Melanoma ([place])    upper thigh on right leg   Rheumatoid arthritis(714.0)    Thoracic discitis    Past Surgical History:  Procedure Laterality Date   CATARACT EXTRACTION, BILATERAL     COLONOSCOPY     hole in retina surgery     INGUINAL HERNIA REPAIR Left 02/09/2019   Procedure: LEFT INGUINAL HERNIA REPAIR WITH MESH;  Surgeon: Rolm Bookbinder, MD;  Location: [place];  Service: General;  Laterality: Left;  GENERAL AND TAP BLOCK   INGUINAL HERNIA REPAIR Right 02/14/2022   Procedure: RIGHT INGUINAL HERNIA REPAIR;  Surgeon: Rolm Bookbinder, MD;  Location: [place];  Service: General;   Laterality: Right;   INSERTION OF MESH Right 02/14/2022   Procedure: INSERTION OF MESH;  Surgeon: Rolm Bookbinder, MD;  Location: [place];  Service: General;  Laterality: Right;   IR GENERIC HISTORICAL  04/24/2016   IR US GUIDE VASC ACCESS RIGHT 04/24/2016 Arne Cleveland, MD [facility]   IR GENERIC HISTORICAL  04/24/2016   IR FLUORO GUIDE CV LINE RIGHT 04/24/2016 Arne Cleveland, MD [facility]   TEE WITHOUT CARDIOVERSION N/A 02/02/2016   Procedure: TRANSESOPHAGEAL ECHOCARDIOGRAM (TEE);  Surgeon: Jerline Pain, MD;  Location: [facility] ENDOSCOPY;  Service: Cardiovascular;  Laterality: N/A;   TONSILLECTOMY      Current Outpatient Medications  Medication Sig Dispense Refill   acetaminophen (TYLENOL) 650 MG CR tablet Take 650 mg by mouth every 8 (eight) hours as needed for pain.     alfuzosin (UROXATRAL) 10 MG 24 hr tablet Take 10 mg by mouth every evening.     amLODipine (NORVASC) 5 MG tablet Take 5 mg by mouth daily.     aspirin EC 81 MG tablet Take 1 tablet (81 mg total) by mouth daily. Swallow whole. 30 tablet 12   Calcium Carb-Cholecalciferol (CALCIUM 600/VITAMIN D3 PO) Take 1 tablet by mouth daily.     cholecalciferol (VITAMIN D) 25 MCG (1000 UT) tablet Take 1,000 Units by mouth in the morning and at bedtime.     Cyanocobalamin (VITAMIN B12) 1000 MCG TBCR Take 1,000  mcg by mouth in the morning and at bedtime.     diclofenac Sodium (VOLTAREN) 1 % GEL Apply 1 Application topically 2 (two) times daily as needed (pain).     doxycycline (VIBRA-TABS) 100 MG tablet Take 1 tablet (100 mg total) by mouth 2 (two) times daily. 60 tablet 11   finasteride (PROSCAR) 5 MG tablet Take 5 mg by mouth daily.     folic acid (FOLVITE) 1 MG tablet Take 1 mg by mouth daily.     golimumab (SIMPONI ARIA) 50 MG/4ML SOLN injection Inject 50 mg into the vein See admin instructions. Inject 50 mg intravenous Every 6-8 weeks per patient     iron polysaccharides (FERREX 150) 150 MG capsule Take 150 mg by mouth daily.      lisinopril (ZESTRIL) 20 MG tablet Take 20 mg by mouth daily.     Melatonin 5 MG TABS Take 5 mg by mouth at bedtime.     methotrexate (RHEUMATREX) 2.5 MG tablet Take 10 mg by mouth See admin instructions. Take 10 mg twice daily every Thursday per patient     metoprolol succinate (TOPROL-XL) 50 MG 24 hr tablet Take 25 mg by mouth in the morning and at bedtime.     Misc Natural Products (TURMERIC CURCUMIN) CAPS Take 1 capsule by mouth daily.     Multiple Vitamin (MULTIVITAMIN WITH MINERALS) TABS tablet Take 1 tablet by mouth daily.     rosuvastatin (CRESTOR) 20 MG tablet Take 1 tablet (20 mg total) by mouth daily. 30 tablet 1   sennosides-docusate sodium (SENOKOT-S) 8.6-50 MG tablet Take 1 tablet by mouth daily as needed for constipation.     traMADol (ULTRAM) 50 MG tablet Take 1 tablet (50 mg total) by mouth every 8 (eight) hours as needed. 20 tablet 0   No current facility-administered medications for this visit.    Allergies  Allergen Reactions   Other Diarrhea, Nausea And Vomiting and Other (See Comments)    Seafood - mussels    Penicillins Cross Reactors Hives    Did it involve swelling of the face/tongue/throat, SOB, or low BP? Unknown Did it involve sudden or severe rash/hives, skin peeling, or any reaction on the inside of your mouth or nose? Unknown Did you need to seek medical attention at a hospital or doctor's office? Yes When did it last happen? over 65 years ago     If all above answers are "NO", may proceed with cephalosporin use. .    Social History   Socioeconomic History   Marital status: Married    Spouse name: Not on file   Number of children: 2   Years of education: Not on file   Highest education level: Not on file  Occupational History   Occupation: Retired-management in retail  Tobacco Use   Smoking status: Former    Types: Pipe, Landscape architect, Cigarettes   Smokeless tobacco: Never   Tobacco comments:    quit 42 yrs. ago  Vaping Use   Vaping Use: Never used   Substance and Sexual Activity   Alcohol use: Yes    Comment: 4x week glass wine   Drug use: No   Sexual activity: Not on file  Other Topics Concern   Not on file  Social History Narrative   Not on file   Social Determinants of Health   Financial Resource Strain: Not on file  Food Insecurity: No Food Insecurity (03/28/2022)   Hunger Vital Sign    Worried About Running Out of Food in  the Last Year: Never true    [place] in the Last Year: Never true  Transportation Needs: No Transportation Needs (03/28/2022)   PRAPARE - Hydrologist (Medical): No    Lack of Transportation (Non-Medical): No  Physical Activity: Not on file  Stress: Not on file  Social Connections: Not on file  Intimate Partner Violence: Not At Risk (03/28/2022)   Humiliation, Afraid, Rape, and Kick questionnaire    Fear of Current or Ex-Partner: No    Emotionally Abused: No    Physically Abused: No    Sexually Abused: No    Family History  Problem Relation Age of Onset   Prostate cancer Father    Prostate cancer Brother    Osteoarthritis Brother     Review of Systems:  As stated in the HPI and otherwise negative.   There were no vitals taken for this visit.  Physical Examination: General: Well developed, well nourished, NAD  HEENT: OP clear, mucus membranes moist  SKIN: warm, dry. No rashes. Neuro: No focal deficits  Musculoskeletal: Muscle strength 5/5 all ext  Psychiatric: Mood and affect normal  Neck: No JVD, no carotid bruits, no thyromegaly, no lymphadenopathy.  Lungs:Clear bilaterally, no wheezes, rhonci, crackles Cardiovascular: Regular rate and rhythm. No murmurs, gallops or rubs. Abdomen:Soft. Bowel sounds present. Non-tender.  Extremities: No lower extremity edema. Pulses are 2 + in the bilateral DP/PT.  EKG:  EKG is not *** ordered today. The ekg ordered today demonstrates   Echo 03/28/22:  1. Partial fusion of left and noncoronary cusps with very  mild AS (peak  velocity 2.4 m/s; mean gradient 9 mmHg).   2. Left ventricular ejection fraction, by estimation, is 60 to 65%. The  left ventricle has normal function. The left ventricle has no regional  wall motion abnormalities. There is mild concentric left ventricular  hypertrophy. Left ventricular diastolic  parameters are consistent with Grade I diastolic dysfunction (impaired  relaxation).   3. Right ventricular systolic function is normal. The right ventricular  size is normal.   4. The mitral valve is normal in structure. Trivial mitral valve  regurgitation. No evidence of mitral stenosis.   5. The aortic valve is tricuspid. Aortic valve regurgitation is not  visualized. Mild aortic valve stenosis.   6. The inferior vena cava is normal in size with greater than 50%  respiratory variability, suggesting right atrial pressure of 3 mmHg.   Recent Labs: 03/05/2022: Magnesium 1.7 03/29/2022: ALT 13; BUN 14; Creatinine, Ser 0.84; Hemoglobin 11.0; Platelets 163; Potassium 4.0; Sodium 137   Lipid Panel    Component Value Date/Time   CHOL 183 03/29/2022 0427   TRIG 90 03/29/2022 0427   HDL 62 03/29/2022 0427   CHOLHDL 3.0 03/29/2022 0427   VLDL 18 03/29/2022 0427   LDLCALC 103 (H) 03/29/2022 0427     Wt Readings from Last 3 Encounters:  04/26/22 151 lb (68.5 kg)  03/28/22 153 lb 14.1 oz (69.8 kg)  03/02/22 150 lb (68 kg)    Assessment and Plan:   1. Syncope: Unclear etiology. Neck CTA without evidence of obstructive CAD or carotid dissection. Echo with normal LV function. Mild AS but this would not cause his syncope. No evidence of a CVA on MRI. ( He did receive TNK in the ED). Cardiac monitor with sinus bradycardia with no high grade AV block. There was SVT but this is not felt to likely be a cause for his syncope. ***  Labs/ tests ordered today include:  No orders of the defined types were placed in this encounter.  Disposition:   F/U with me in 6 months.     Signed, Lauree Chandler, MD, [facility] 05/28/2022 9:46 AM    [address], [place], [place]  [address] Phone: [phone]; Fax: [phone]

## 2022-05-29 ENCOUNTER — Encounter: Payer: Self-pay | Admitting: Cardiovascular Disease

## 2022-05-29 ENCOUNTER — Ambulatory Visit: Payer: Medicare Other | Attending: Cardiovascular Disease | Admitting: Cardiovascular Disease

## 2022-05-29 VITALS — BP 132/66 | HR 70 | Ht 66.0 in | Wt 152.8 lb

## 2022-05-29 DIAGNOSIS — I2510 Atherosclerotic heart disease of native coronary artery without angina pectoris: Secondary | ICD-10-CM | POA: Diagnosis not present

## 2022-05-29 DIAGNOSIS — R55 Syncope and collapse: Secondary | ICD-10-CM | POA: Insufficient documentation

## 2022-05-29 NOTE — Patient Instructions (Signed)
Medication Instructions:  Your physician recommends that you continue on your current medications as directed. Please refer to the Current Medication list given to you today.  *If you need a refill on your cardiac medications before your next appointment, please call your pharmacy*   Lab Work: None. If you have labs (blood work) drawn today and your tests are completely normal, you will receive your results only by: [place] (if you have MyChart) OR A paper copy in the mail If you have any lab test that is abnormal or we need to change your treatment, we will call you to review the results.   Testing/Procedures: None.   Follow-Up: At [facility], you and your health needs are our priority.  As part of our continuing mission to provide you with exceptional heart care, we have created designated Provider Care Teams.  These Care Teams include your primary Cardiologist (physician) and Advanced Practice Providers (APPs -  Physician Assistants and Nurse Practitioners) who all work together to provide you with the care you need, when you need it.  We recommend signing up for the patient portal called "MyChart".  Sign up information is provided on this After Visit Summary.  MyChart is used to connect with patients for Virtual Visits (Telemedicine).  Patients are able to view lab/test results, encounter notes, upcoming appointments, etc.  Non-urgent messages can be sent to your provider as well.   To learn more about what you can do with MyChart, go to [email or web address].    Your next appointment:   6 month(s)  The format for your next appointment:   In Person  Provider:   Lauree Chandler, MD      Important Information About Sugar

## 2022-06-20 DIAGNOSIS — D649 Anemia, unspecified: Secondary | ICD-10-CM | POA: Diagnosis not present

## 2022-06-20 DIAGNOSIS — Z79899 Other long term (current) drug therapy: Secondary | ICD-10-CM | POA: Diagnosis not present

## 2022-07-09 DIAGNOSIS — M0579 Rheumatoid arthritis with rheumatoid factor of multiple sites without organ or systems involvement: Secondary | ICD-10-CM | POA: Diagnosis not present

## 2022-07-11 DIAGNOSIS — L720 Epidermal cyst: Secondary | ICD-10-CM | POA: Diagnosis not present

## 2022-07-11 DIAGNOSIS — C44311 Basal cell carcinoma of skin of nose: Secondary | ICD-10-CM | POA: Diagnosis not present

## 2022-07-11 DIAGNOSIS — C44629 Squamous cell carcinoma of skin of left upper limb, including shoulder: Secondary | ICD-10-CM | POA: Diagnosis not present

## 2022-07-11 DIAGNOSIS — L821 Other seborrheic keratosis: Secondary | ICD-10-CM | POA: Diagnosis not present

## 2022-07-11 DIAGNOSIS — L570 Actinic keratosis: Secondary | ICD-10-CM | POA: Diagnosis not present

## 2022-07-11 DIAGNOSIS — Z85820 Personal history of malignant melanoma of skin: Secondary | ICD-10-CM | POA: Diagnosis not present

## 2022-07-11 DIAGNOSIS — Z85828 Personal history of other malignant neoplasm of skin: Secondary | ICD-10-CM | POA: Diagnosis not present

## 2022-07-11 DIAGNOSIS — D1801 Hemangioma of skin and subcutaneous tissue: Secondary | ICD-10-CM | POA: Diagnosis not present

## 2022-08-01 DIAGNOSIS — H52203 Unspecified astigmatism, bilateral: Secondary | ICD-10-CM | POA: Diagnosis not present

## 2022-08-01 DIAGNOSIS — H35373 Puckering of macula, bilateral: Secondary | ICD-10-CM | POA: Diagnosis not present

## 2022-08-01 DIAGNOSIS — Z961 Presence of intraocular lens: Secondary | ICD-10-CM | POA: Diagnosis not present

## 2022-08-20 ENCOUNTER — Other Ambulatory Visit: Payer: Self-pay | Admitting: Internal Medicine

## 2022-08-20 DIAGNOSIS — M4644 Discitis, unspecified, thoracic region: Secondary | ICD-10-CM

## 2022-08-21 ENCOUNTER — Telehealth: Payer: Self-pay

## 2022-08-21 NOTE — Telephone Encounter (Signed)
LVM, patient needs appointment.   Beryle Flock, RN

## 2022-08-21 NOTE — Telephone Encounter (Signed)
Received refill request for doxycycline, patient due for appointment. Called to schedule, no answer. Left HIPAA compliant voicemail requesting callback.   Beryle Flock, RN

## 2022-08-28 ENCOUNTER — Ambulatory Visit (INDEPENDENT_AMBULATORY_CARE_PROVIDER_SITE_OTHER): Payer: Medicare Other | Admitting: Internal Medicine

## 2022-08-28 ENCOUNTER — Other Ambulatory Visit: Payer: Self-pay

## 2022-08-28 DIAGNOSIS — M4644 Discitis, unspecified, thoracic region: Secondary | ICD-10-CM

## 2022-08-28 MED ORDER — DOXYCYCLINE HYCLATE 100 MG PO TABS: 100.0000 mg | ORAL_TABLET | Freq: Two times a day (BID) | ORAL | 11 refills | Status: DC

## 2022-08-28 NOTE — Assessment & Plan Note (Signed)
He prefers to stay on chronic, suppressive doxycycline therapy.  He will follow-up in 1 year.

## 2022-08-28 NOTE — Progress Notes (Signed)
[facility] for Infectious Disease  Patient Active Problem List   Diagnosis Date Noted   MRSA bacteremia     Priority: High   Thoracic discitis     Priority: High   Constipation 05/07/2016    Priority: Medium    Protein-calorie malnutrition ([place]) 05/07/2016    Priority: Medium    Syncopal episodes 03/30/2022   Knee osteoarthritis 03/03/2022   Altered mental status 10/05/2020   Hyperglycemia 01/30/2016   Normocytic anemia 01/30/2016   Atherosclerotic peripheral vascular disease ([place]) 01/30/2016   Degenerative joint disease of spine 01/30/2016   BPH (benign prostatic hyperplasia) 01/30/2016   Rheumatoid arthritis involving multiple sites with positive rheumatoid factor ([place]) 01/29/2016   Essential hypertension 01/29/2016   Left adrenal mass ([place]) 01/29/2016    Patient's Medications  New Prescriptions   No medications on file  Previous Medications   ACETAMINOPHEN (TYLENOL) 650 MG CR TABLET    Take 650 mg by mouth every 8 (eight) hours as needed for pain.   ALFUZOSIN (UROXATRAL) 10 MG 24 HR TABLET    Take 10 mg by mouth every evening.   AMLODIPINE (NORVASC) 5 MG TABLET    Take 5 mg by mouth daily.   ASPIRIN EC 81 MG TABLET    Take 1 tablet (81 mg total) by mouth daily. Swallow whole.   CALCIUM CARB-CHOLECALCIFEROL (CALCIUM 600/VITAMIN D3 PO)    Take 1 tablet by mouth daily.   CHOLECALCIFEROL (VITAMIN D) 25 MCG (1000 UT) TABLET    Take 1,000 Units by mouth in the morning and at bedtime.   CYANOCOBALAMIN (VITAMIN B12) 1000 MCG TBCR    Take 1,000 mcg by mouth in the morning and at bedtime.   DICLOFENAC SODIUM (VOLTAREN) 1 % GEL    Apply 1 Application topically 2 (two) times daily as needed (pain).   FINASTERIDE (PROSCAR) 5 MG TABLET    Take 5 mg by mouth daily.   FOLIC ACID (FOLVITE) 1 MG TABLET    Take 1 mg by mouth daily.   GOLIMUMAB (SIMPONI ARIA) 50 MG/4ML SOLN INJECTION    Inject 50 mg into the vein See admin instructions. Inject 50 mg intravenous Every 6-8 weeks  per patient   IRON POLYSACCHARIDES (FERREX 150) 150 MG CAPSULE    Take 150 mg by mouth daily.   LISINOPRIL (ZESTRIL) 20 MG TABLET    Take 20 mg by mouth daily.   MELATONIN 5 MG TABS    Take 5 mg by mouth at bedtime.   METHOTREXATE (RHEUMATREX) 2.5 MG TABLET    Take 10 mg by mouth See admin instructions. Take 10 mg twice daily every Thursday per patient   METOPROLOL SUCCINATE (TOPROL-XL) 50 MG 24 HR TABLET    Take 25 mg by mouth in the morning and at bedtime.   MISC NATURAL PRODUCTS (TURMERIC CURCUMIN) CAPS    Take 1 capsule by mouth daily.   MULTIPLE VITAMIN (MULTIVITAMIN WITH MINERALS) TABS TABLET    Take 1 tablet by mouth daily.   ROSUVASTATIN (CRESTOR) 20 MG TABLET    Take 1 tablet (20 mg total) by mouth daily.   SENNOSIDES-DOCUSATE SODIUM (SENOKOT-S) 8.6-50 MG TABLET    Take 1 tablet by mouth daily as needed for constipation.   TRAMADOL (ULTRAM) 50 MG TABLET    Take 1 tablet (50 mg total) by mouth every 8 (eight) hours as needed.  Modified Medications   Modified Medication Previous Medication   DOXYCYCLINE (VIBRA-TABS) 100 MG TABLET doxycycline (VIBRA-TABS)  100 MG tablet      Take 1 tablet (100 mg total) by mouth 2 (two) times daily.    TAKE ONE TABLET BY MOUTH TWICE DAILY  Discontinued Medications   No medications on file    Subjective: Tanner Rice is in for his routine follow-up visit. He has not had any problems obtaining, taking or tolerating his doxycycline.  He developed MRSA bacteremia and thoracic discitis in August 2017 and had a relapse of his severe back pain after completing 2 months of antibiotic therapy.  He has been on chronic suppressive doxycycline ever since then.  He has not had any fever, nausea, vomiting, diarrhea and is not having any back pain currently.  He remains reluctant to stop doxycycline given the early relapse and second hospitalization he suffered in 2017.   His chronic back pain is mild and unchanged.  He started golimumab infusions last year for his rheumatoid  arthritis.  He recently had to stop it for short period of time when he had a inguinal hernia repair.  His arthritis did flare but is coming back under control since he restarted it.  He has not had any problems with diarrhea.  Review of Systems: Review of Systems  Constitutional:  Negative for chills, diaphoresis, fever and weight loss.  Gastrointestinal:  Positive for constipation. Negative for abdominal pain, diarrhea, nausea and vomiting.  Musculoskeletal:  Positive for back pain and joint pain.    Past Medical History:  Diagnosis Date   Aortic stenosis    Hypertension    Melanoma ([place])    upper thigh on right leg   Rheumatoid arthritis(714.0)    Thoracic discitis     Social History   Tobacco Use   Smoking status: Former    Types: Pipe, Cigars, Cigarettes   Smokeless tobacco: Never   Tobacco comments:    quit 42 yrs. ago  Vaping Use   Vaping Use: Never used  Substance Use Topics   Alcohol use: Yes    Comment: 4x week glass wine   Drug use: No    Family History  Problem Relation Age of Onset   Prostate cancer Father    Prostate cancer Brother    Osteoarthritis Brother     Allergies  Allergen Reactions   Other Diarrhea, Nausea And Vomiting and Other (See Comments)    Seafood - mussels    Penicillins Cross Reactors Hives    Did it involve swelling of the face/tongue/throat, SOB, or low BP? Unknown Did it involve sudden or severe rash/hives, skin peeling, or any reaction on the inside of your mouth or nose? Unknown Did you need to seek medical attention at a hospital or doctor's office? Yes When did it last happen? over 65 years ago     If all above answers are "NO", may proceed with cephalosporin use. .    Objective: Vitals:   08/28/22 0931  BP: 118/69  Pulse: 60  Temp: (!) 97.4 F (36.3 C)  TempSrc: Temporal  SpO2: 99%  Weight: 150 lb (68 kg)   Body mass index is 24.21 kg/m.  Physical Exam Constitutional:      Comments: He is in good spirits.   Cardiovascular:     Rate and Rhythm: Normal rate and regular rhythm.     Heart sounds: No murmur heard. Pulmonary:     Effort: Pulmonary effort is normal.     Breath sounds: Normal breath sounds.  Neurological:     Gait: Gait normal.  Psychiatric:  Mood and Affect: Mood normal.     Lab Results Sed Rate  Date Value  03/03/2022 64 mm/hr (H)  08/14/2021 9 mm/h  02/13/2021 6 mm/h   CRP  Date Value  03/03/2022 19.3 mg/dL (H)  08/14/2021 0.5 mg/L  02/13/2021 0.6 mg/L      Problem List Items Addressed This Visit       High   Thoracic discitis    He prefers to stay on chronic, suppressive doxycycline therapy.  He will follow-up in 1 year.      Relevant Medications   doxycycline (VIBRA-TABS) 100 MG tablet     Michel Bickers, MD [facility] for Infectious [place] [phone] pager   [phone] cell 08/28/2022, 9:41 AM

## 2022-09-03 DIAGNOSIS — M0579 Rheumatoid arthritis with rheumatoid factor of multiple sites without organ or systems involvement: Secondary | ICD-10-CM | POA: Diagnosis not present

## 2022-09-24 DIAGNOSIS — E162 Hypoglycemia, unspecified: Secondary | ICD-10-CM | POA: Diagnosis not present

## 2022-09-24 DIAGNOSIS — R634 Abnormal weight loss: Secondary | ICD-10-CM | POA: Diagnosis not present

## 2022-09-24 DIAGNOSIS — D3500 Benign neoplasm of unspecified adrenal gland: Secondary | ICD-10-CM | POA: Diagnosis not present

## 2022-10-01 ENCOUNTER — Emergency Department (HOSPITAL_BASED_OUTPATIENT_CLINIC_OR_DEPARTMENT_OTHER)
Admission: EM | Admit: 2022-10-01 | Discharge: 2022-10-01 | Disposition: A | Discharge status: Home / Self Care | Payer: Medicare Other | Attending: Emergency Medicine | Admitting: Emergency Medicine

## 2022-10-01 ENCOUNTER — Encounter (HOSPITAL_BASED_OUTPATIENT_CLINIC_OR_DEPARTMENT_OTHER): Payer: Self-pay | Admitting: Urology

## 2022-10-01 DIAGNOSIS — J069 Acute upper respiratory infection, unspecified: Secondary | ICD-10-CM | POA: Insufficient documentation

## 2022-10-01 DIAGNOSIS — B9789 Other viral agents as the cause of diseases classified elsewhere: Secondary | ICD-10-CM | POA: Diagnosis not present

## 2022-10-01 DIAGNOSIS — Z7982 Long term (current) use of aspirin: Secondary | ICD-10-CM | POA: Diagnosis not present

## 2022-10-01 DIAGNOSIS — R0981 Nasal congestion: Secondary | ICD-10-CM | POA: Diagnosis present

## 2022-10-01 MED ORDER — AZITHROMYCIN 250 MG PO TABS: 250.0000 mg | ORAL_TABLET | Freq: Every day | ORAL | 0 refills | Status: DC

## 2022-10-01 MED ORDER — DEXAMETHASONE 4 MG PO TABS
10.0000 mg | ORAL_TABLET | Freq: Once | ORAL | Status: AC
  Administered 2022-10-01: 10 mg via ORAL
  Filled 2022-10-01: qty 3

## 2022-10-01 NOTE — Discharge Instructions (Signed)
Take antibiotics as prescribed.  You have been treated for long-acting steroid.  Follow-up with primary care doctor.

## 2022-10-01 NOTE — ED Triage Notes (Signed)
Pt states feels like he has a build up of phlegm in his throat x 10 days States using mucinex with no relief  Denies SOB, denies pain

## 2022-10-01 NOTE — ED Provider Notes (Signed)
[facility] Provider Note   CSN: [id number] Arrival date & time: 10/01/22  1400     History  Chief Complaint  Patient presents with   URI    Tanner Rice is a 89 y.o. male.  Patient here with nasal congestion, sore throat.  He is not having any issues eating or drinking.  Denies any shortness of breath or chest pain.  Feels like he has a lot of phlegm buildup.  May be some seasonal allergies.  He denies any fevers or chills.  He has a history of arthritis, hypertension.  Denies any weakness, numbness, trouble breathing.  The history is provided by the patient.       Home Medications Prior to Admission medications   Medication Sig Start Date End Date Taking? Authorizing Provider  azithromycin (ZITHROMAX) 250 MG tablet Take 1 tablet (250 mg total) by mouth daily. Take first 2 tablets together, then 1 every day until finished. 10/01/22  Yes Reyann Troop, DO  acetaminophen (TYLENOL) 650 MG CR tablet Take 650 mg by mouth every 8 (eight) hours as needed for pain.    [provider]  alfuzosin (UROXATRAL) 10 MG 24 hr tablet Take 10 mg by mouth every evening. 10/20/18   [provider]  amLODipine (NORVASC) 5 MG tablet Take 5 mg by mouth daily. 12/17/21   [provider]  aspirin EC 81 MG tablet Take 1 tablet (81 mg total) by mouth daily. Swallow whole. 03/31/22   Elmer Picker, NP  Calcium Carb-Cholecalciferol (CALCIUM 600/VITAMIN D3 PO) Take 1 tablet by mouth daily.    [provider]  cholecalciferol (VITAMIN D) 25 MCG (1000 UT) tablet Take 1,000 Units by mouth in the morning and at bedtime.    [provider]  Cyanocobalamin (VITAMIN B12) 1000 MCG TBCR Take 1,000 mcg by mouth in the morning and at bedtime.    [provider]  diclofenac Sodium (VOLTAREN) 1 % GEL Apply 1 Application topically 2 (two) times daily as needed (pain).    [provider]  doxycycline (VIBRA-TABS) 100  MG tablet Take 1 tablet (100 mg total) by mouth 2 (two) times daily. 08/28/22   Cliffton Asters, MD  finasteride (PROSCAR) 5 MG tablet Take 5 mg by mouth daily. 01/14/22   [provider]  folic acid (FOLVITE) 1 MG tablet Take 1 mg by mouth daily. 01/08/20   [provider]  golimumab (SIMPONI ARIA) 50 MG/4ML SOLN injection Inject 50 mg into the vein See admin instructions. Inject 50 mg intravenous Every 6-8 weeks per patient 01/03/20   [provider]  iron polysaccharides (FERREX 150) 150 MG capsule Take 150 mg by mouth daily.    [provider]  lisinopril (ZESTRIL) 20 MG tablet Take 20 mg by mouth daily. 01/07/19   [provider]  Melatonin 5 MG TABS Take 5 mg by mouth at bedtime.    [provider]  methotrexate (RHEUMATREX) 2.5 MG tablet Take 10 mg by mouth See admin instructions. Take 10 mg twice daily every Thursday per patient 01/31/20   [provider]  metoprolol succinate (TOPROL-XL) 50 MG 24 hr tablet Take 25 mg by mouth in the morning and at bedtime.    [provider]  Misc Natural Products (TURMERIC CURCUMIN) CAPS Take 1 capsule by mouth daily.    [provider]  Multiple Vitamin (MULTIVITAMIN WITH MINERALS) TABS tablet Take 1 tablet by mouth daily.    [provider]  rosuvastatin (CRESTOR) 20 MG tablet Take 1 tablet (20 mg total) by mouth daily. Patient not taking: Reported on 08/28/2022 03/31/22   Elmer Picker, NP  sennosides-docusate sodium (SENOKOT-S) 8.6-50 MG tablet Take 1 tablet by mouth daily as needed for constipation.    [provider]  traMADol (ULTRAM) 50 MG tablet Take 1 tablet (50 mg total) by mouth every 8 (eight) hours as needed. 03/06/22 03/06/23  Calvert Cantor, MD      Allergies    Other and Penicillins cross reactors    Review of Systems   Review of Systems  Physical Exam Updated Vital Signs BP (!) 144/70 (BP Location: Left Arm)   Pulse 67   Temp 98 F (36.7 C)    Resp 20   Ht  (1.676 m)   Wt 68 kg   SpO2 100%   BMI 24.20 kg/m  Physical Exam Vitals and nursing note reviewed.  Constitutional:      General: He is not in acute distress.    Appearance: He is well-developed. He is not ill-appearing.  HENT:     Head: Normocephalic and atraumatic.     Nose: Congestion present.     Mouth/Throat:     Mouth: Mucous membranes are moist.     Pharynx: No oropharyngeal exudate or posterior oropharyngeal erythema.  Eyes:     Extraocular Movements: Extraocular movements intact.     Conjunctiva/sclera: Conjunctivae normal.     Pupils: Pupils are equal, round, and reactive to light.  Cardiovascular:     Rate and Rhythm: Normal rate and regular rhythm.     Pulses: Normal pulses.     Heart sounds: Normal heart sounds. No murmur heard. Pulmonary:     Effort: Pulmonary effort is normal. No respiratory distress.     Breath sounds: Normal breath sounds.  Abdominal:     General: Abdomen is flat.     Palpations: Abdomen is soft.     Tenderness: There is no abdominal tenderness.  Musculoskeletal:        General: No swelling.     Cervical back: Normal range of motion and neck supple.  Skin:    General: Skin is warm and dry.     Capillary Refill: Capillary refill takes less than 2 seconds.  Neurological:     General: No focal deficit present.     Mental Status: He is alert and oriented to person, place, and time.  Psychiatric:        Mood and Affect: Mood normal.     ED Results / Procedures / Treatments   Labs (all labs ordered are listed, but only abnormal results are displayed) Labs Reviewed - No data to display  EKG None  Radiology No results found.  Procedures Procedures    Medications Ordered in ED Medications  dexamethasone (DECADRON) tablet 10 mg (has no administration in time range)    ED Course/ Medical Decision Making/ A&P                             Medical Decision Making Risk Prescription drug  management.   Tanner Rice is here with nasal congestion cough.  Normal vitals.  No fever.  No signs of throat infection on exam.  Clear breath sounds.  Is not having any respiratory symptoms.  Differential diagnosis likely viral process versus seasonal allergies.  I do not see any signs of throat infection on exam.  He is well-appearing.  Will  treat with antibiotics and steroids.  He is got no trismus, no drooling, no difficulty eating or drinking.  Have no concern for other infectious process in the head or neck.  Has had nasal congestion on exam.  Clear breath sounds.  No respiratory distress.  No fever.  Overall we will have him follow-up with primary care doctor.  Understands return precautions.  Discharged in good condition.  This chart was dictated using voice recognition software.  Despite best efforts to proofread,  errors can occur which can change the documentation meaning.         Final Clinical Impression(s) / ED Diagnoses Final diagnoses:  Viral upper respiratory tract infection    Rx / DC Orders ED Discharge Orders          Ordered    azithromycin (ZITHROMAX) 250 MG tablet  Daily        10/01/22 1420              Ossineke, Madelaine Bhat, DO 10/01/22 1422

## 2022-10-08 DIAGNOSIS — K219 Gastro-esophageal reflux disease without esophagitis: Secondary | ICD-10-CM | POA: Diagnosis not present

## 2022-10-08 DIAGNOSIS — Z0000 Encounter for general adult medical examination without abnormal findings: Secondary | ICD-10-CM | POA: Diagnosis not present

## 2022-10-08 DIAGNOSIS — I700 Atherosclerosis of aorta: Secondary | ICD-10-CM | POA: Diagnosis not present

## 2022-10-08 DIAGNOSIS — L989 Disorder of the skin and subcutaneous tissue, unspecified: Secondary | ICD-10-CM | POA: Diagnosis not present

## 2022-10-08 DIAGNOSIS — I10 Essential (primary) hypertension: Secondary | ICD-10-CM | POA: Diagnosis not present

## 2022-10-08 DIAGNOSIS — E7800 Pure hypercholesterolemia, unspecified: Secondary | ICD-10-CM | POA: Diagnosis not present

## 2022-10-08 DIAGNOSIS — M069 Rheumatoid arthritis, unspecified: Secondary | ICD-10-CM | POA: Diagnosis not present

## 2022-10-08 DIAGNOSIS — H9190 Unspecified hearing loss, unspecified ear: Secondary | ICD-10-CM | POA: Diagnosis not present

## 2022-10-08 DIAGNOSIS — Z1331 Encounter for screening for depression: Secondary | ICD-10-CM | POA: Diagnosis not present

## 2022-10-08 DIAGNOSIS — Z79899 Other long term (current) drug therapy: Secondary | ICD-10-CM | POA: Diagnosis not present

## 2022-10-08 DIAGNOSIS — Z23 Encounter for immunization: Secondary | ICD-10-CM | POA: Diagnosis not present

## 2022-10-08 DIAGNOSIS — D649 Anemia, unspecified: Secondary | ICD-10-CM | POA: Diagnosis not present

## 2022-10-10 DIAGNOSIS — C61 Malignant neoplasm of prostate: Secondary | ICD-10-CM | POA: Diagnosis not present

## 2022-10-10 DIAGNOSIS — N401 Enlarged prostate with lower urinary tract symptoms: Secondary | ICD-10-CM | POA: Diagnosis not present

## 2022-10-10 DIAGNOSIS — R351 Nocturia: Secondary | ICD-10-CM | POA: Diagnosis not present

## 2022-10-29 DIAGNOSIS — M0579 Rheumatoid arthritis with rheumatoid factor of multiple sites without organ or systems involvement: Secondary | ICD-10-CM | POA: Diagnosis not present

## 2022-12-02 ENCOUNTER — Encounter: Payer: Self-pay | Admitting: Cardiovascular Disease

## 2022-12-02 ENCOUNTER — Ambulatory Visit: Payer: Medicare Other | Attending: Cardiovascular Disease | Admitting: Cardiovascular Disease

## 2022-12-02 VITALS — BP 118/62 | HR 103 | Ht 66.0 in | Wt 148.0 lb

## 2022-12-02 DIAGNOSIS — I350 Nonrheumatic aortic (valve) stenosis: Secondary | ICD-10-CM | POA: Diagnosis not present

## 2022-12-02 DIAGNOSIS — I471 Supraventricular tachycardia, unspecified: Secondary | ICD-10-CM

## 2022-12-02 NOTE — Progress Notes (Signed)
Chief Complaint  Patient presents with   Follow-up    SVT   History of Present Illness 89 yo male with history of aortic stenosis, SVT, HTN, rheumatoid arthritis and melanoma who is here today for follow up. I saw him as a new patient for the evaluation of syncope on 04/26/22. He was admitted to [facility] 03/28/22 after becoming lightheaded at work. He became weak and fell. He does not think he passed out. He was found to have left facial droop. Code Stroke called and he was given emergent TNK. His blood sugar level was low. He was evaluated by the Neurology team while admitted and no evidence of CVA on brain MRI. He was bradycardic during his admission and his beta blocker was held. Echo 03/28/22 with LVEF=60-65%. Trivial MR. Mild aortic stenosis (mean gradient 9 mmHg). Question of whether or not he may have had syncope from bradycardia or heart block. Neck CTA on 03/28/22 with mild to moderate LICA stenosis, no RICA stenosis. Cardiac monitor November 2023 with sinus with lowest heart rate 36 bpm, first degree AV block, bundle branch block, 5 beat run of VT and 18 runs of SVT.   He is here today for follow up. The patient denies any chest pain, dyspnea, palpitations, lower extremity edema, orthopnea, PND, dizziness, near syncope or syncope.   Primary Care Physician: Emilio Aspen, MD   Past Medical History:  Diagnosis Date   Aortic stenosis    Hypertension    Melanoma (HCC)    upper thigh on right leg   Rheumatoid arthritis(714.0)    Thoracic discitis    Past Surgical History:  Procedure Laterality Date   CATARACT EXTRACTION, BILATERAL     COLONOSCOPY     hole in retina surgery     INGUINAL HERNIA REPAIR Left 02/09/2019   Procedure: LEFT INGUINAL HERNIA REPAIR WITH MESH;  Surgeon: Emelia Loron, MD;  Location: [facility] OR;  Service: General;  Laterality: Left;  GENERAL AND TAP BLOCK   INGUINAL HERNIA REPAIR Right 02/14/2022   Procedure: RIGHT INGUINAL HERNIA REPAIR;  Surgeon:  Emelia Loron, MD;  Location: [facility] OR;  Service: General;  Laterality: Right;   INSERTION OF MESH Right 02/14/2022   Procedure: INSERTION OF MESH;  Surgeon: Emelia Loron, MD;  Location: [facility] OR;  Service: General;  Laterality: Right;   IR GENERIC HISTORICAL  04/24/2016   IR US GUIDE VASC ACCESS RIGHT 04/24/2016 Oley Balm, MD [facility]INTER[facility] RAD   IR GENERIC HISTORICAL  04/24/2016   IR FLUORO GUIDE CV LINE RIGHT 04/24/2016 Oley Balm, MD [facility]   TEE WITHOUT CARDIOVERSION N/A 02/02/2016   Procedure: TRANSESOPHAGEAL ECHOCARDIOGRAM (TEE);  Surgeon: Jake Bathe, MD;  Location: [facility] ENDOSCOPY;  Service: Cardiovascular;  Laterality: N/A;   TONSILLECTOMY      Current Outpatient Medications  Medication Sig Dispense Refill   acetaminophen (TYLENOL) 650 MG CR tablet Take 650 mg by mouth every 8 (eight) hours as needed for pain.     alfuzosin (UROXATRAL) 10 MG 24 hr tablet Take 10 mg by mouth every evening.     amLODipine (NORVASC) 5 MG tablet Take 5 mg by mouth daily.     aspirin EC 81 MG tablet Take 1 tablet (81 mg total) by mouth daily. Swallow whole. 30 tablet 12   azithromycin (ZITHROMAX) 250 MG tablet Take 1 tablet (250 mg total) by mouth daily. Take first 2 tablets together, then 1 every day until finished. 6 tablet 0   Calcium Carb-Cholecalciferol (CALCIUM 600/VITAMIN D3  PO) Take 1 tablet by mouth daily.     cholecalciferol (VITAMIN D) 25 MCG (1000 UT) tablet Take 1,000 Units by mouth in the morning and at bedtime.     Cyanocobalamin (VITAMIN B12) 1000 MCG TBCR Take 1,000 mcg by mouth in the morning and at bedtime.     diclofenac Sodium (VOLTAREN) 1 % GEL Apply 1 Application topically 2 (two) times daily as needed (pain).     doxycycline (VIBRA-TABS) 100 MG tablet Take 1 tablet (100 mg total) by mouth 2 (two) times daily. 60 tablet 11   finasteride (PROSCAR) 5 MG tablet Take 5 mg by mouth daily.     folic acid (FOLVITE) 1 MG tablet Take 1 mg by mouth daily.     golimumab (SIMPONI  ARIA) 50 MG/4ML SOLN injection Inject 50 mg into the vein See admin instructions. Inject 50 mg intravenous Every 6-8 weeks per patient     iron polysaccharides (FERREX 150) 150 MG capsule Take 150 mg by mouth daily.     lisinopril (ZESTRIL) 20 MG tablet Take 20 mg by mouth daily.     Melatonin 5 MG TABS Take 5 mg by mouth at bedtime.     methotrexate (RHEUMATREX) 2.5 MG tablet Take 10 mg by mouth See admin instructions. Take 10 mg twice daily every Thursday per patient     metoprolol succinate (TOPROL-XL) 50 MG 24 hr tablet Take 25 mg by mouth in the morning and at bedtime.     Misc Natural Products (TURMERIC CURCUMIN) CAPS Take 1 capsule by mouth daily.     Multiple Vitamin (MULTIVITAMIN WITH MINERALS) TABS tablet Take 1 tablet by mouth daily.     sennosides-docusate sodium (SENOKOT-S) 8.6-50 MG tablet Take 1 tablet by mouth daily as needed for constipation.     traMADol (ULTRAM) 50 MG tablet Take 1 tablet (50 mg total) by mouth every 8 (eight) hours as needed. 20 tablet 0   rosuvastatin (CRESTOR) 20 MG tablet Take 1 tablet (20 mg total) by mouth daily. (Patient not taking: Reported on 12/02/2022) 30 tablet 1   No current facility-administered medications for this visit.    Allergies  Allergen Reactions   Other Diarrhea, Nausea And Vomiting and Other (See Comments)    Seafood - mussels    Penicillins Cross Reactors Hives    Did it involve swelling of the face/tongue/throat, SOB, or low BP? Unknown Did it involve sudden or severe rash/hives, skin peeling, or any reaction on the inside of your mouth or nose? Unknown Did you need to seek medical attention at a hospital or doctor's office? Yes When did it last happen? over 65 years ago     If all above answers are "NO", may proceed with cephalosporin use. .    Social History   Socioeconomic History   Marital status: Married    Spouse name: Not on file   Number of children: 2   Years of education: Not on file   Highest education level:  Not on file  Occupational History   Occupation: Retired-management in retail  Tobacco Use   Smoking status: Former    Types: Pipe, Software engineer, Cigarettes   Smokeless tobacco: Never   Tobacco comments:    quit 42 yrs. ago  Vaping Use   Vaping Use: Never used  Substance and Sexual Activity   Alcohol use: Yes    Comment: 4x week glass wine   Drug use: No   Sexual activity: Not on file  Other Topics Concern  Not on file  Social History Narrative   Not on file   Social Determinants of Health   Financial Resource Strain: Not on file  Food Insecurity: No Food Insecurity (03/28/2022)   Hunger Vital Sign    Worried About Running Out of Food in the Last Year: Never true    Ran Out of Food in the Last Year: Never true  Transportation Needs: No Transportation Needs (03/28/2022)   PRAPARE - Administrator, Civil Service (Medical): No    Lack of Transportation (Non-Medical): No  Physical Activity: Not on file  Stress: Not on file  Social Connections: Not on file  Intimate Partner Violence: Not At Risk (03/28/2022)   Humiliation, Afraid, Rape, and Kick questionnaire    Fear of Current or Ex-Partner: No    Emotionally Abused: No    Physically Abused: No    Sexually Abused: No    Family History  Problem Relation Age of Onset   Prostate cancer Father    Prostate cancer Brother    Osteoarthritis Brother     Review of Systems:  As stated in the HPI and otherwise negative.   BP 118/62   Pulse (!) 103   Ht 5\' 6"  (1.676 m)   Wt 67.1 kg   SpO2 99%   BMI 23.89 kg/m   Physical Examination: General: Well developed, well nourished, NAD  HEENT: OP clear, mucus membranes moist  SKIN: warm, dry. No rashes. Neuro: No focal deficits  Musculoskeletal: Muscle strength 5/5 all ext  Psychiatric: Mood and affect normal  Neck: No JVD, no carotid bruits, no thyromegaly, no lymphadenopathy.  Lungs:Clear bilaterally, no wheezes, rhonci, crackles Cardiovascular: Regular rate and  rhythm. Soft systolic murmur.  Abdomen:Soft. Bowel sounds present. Non-tender.  Extremities: No lower extremity edema. Pulses are 2 + in the bilateral DP/PT.  EKG:  EKG is not ordered today. The ekg ordered today demonstrates   Echo 03/28/22:  1. Partial fusion of left and noncoronary cusps with very mild AS (peak  velocity 2.4 m/s; mean gradient 9 mmHg).   2. Left ventricular ejection fraction, by estimation, is 60 to 65%. The  left ventricle has normal function. The left ventricle has no regional  wall motion abnormalities. There is mild concentric left ventricular  hypertrophy. Left ventricular diastolic  parameters are consistent with Grade I diastolic dysfunction (impaired  relaxation).   3. Right ventricular systolic function is normal. The right ventricular  size is normal.   4. The mitral valve is normal in structure. Trivial mitral valve  regurgitation. No evidence of mitral stenosis.   5. The aortic valve is tricuspid. Aortic valve regurgitation is not  visualized. Mild aortic valve stenosis.   6. The inferior vena cava is normal in size with greater than 50%  respiratory variability, suggesting right atrial pressure of 3 mmHg.   Recent Labs: 03/05/2022: Magnesium 1.7 03/29/2022: ALT 13; BUN 14; Creatinine, Ser 0.84; Hemoglobin 11.0; Platelets 163; Potassium 4.0; Sodium 137   Lipid Panel    Component Value Date/Time   CHOL 183 03/29/2022 0427   TRIG 90 03/29/2022 0427   HDL 62 03/29/2022 0427   CHOLHDL 3.0 03/29/2022 0427   VLDL 18 03/29/2022 0427   LDLCALC 103 (H) 03/29/2022 0427     Wt Readings from Last 3 Encounters:  12/02/22 67.1 kg  10/01/22 68 kg  08/28/22 68 kg    Assessment and Plan:   1. Syncope/sinus bradycardia/SVT: Unclear etiology of syncope. He has had no recurrence of syncope  or near syncope. Neck CTA without evidence of obstructive CAD or carotid dissection. Echo with normal LV function. Mild AS but this would not cause his syncope. No evidence  of a CVA on MRI. ( He did receive TNK in the ED). Cardiac monitor with nocturnal sinus bradycardia with no high grade AV block. There was SVT but this is not felt to likely be a cause for his syncope.  -Continue Toprol  2. Aortic stenosis: Mild by echo in 2023. Will repeat echo in October 2025.   3. SVT: No palpitations. Continue Toprol  Labs/ tests ordered today include:  No orders of the defined types were placed in this encounter.  Disposition:   F/U with me in 6 months.    Signed, Verne Carrow, MD, [facility] 12/02/2022 3:23 PM    [facility] [address] [place], [place], [place]  [address] Phone: [phone]; Fax: [phone]

## 2022-12-02 NOTE — Patient Instructions (Signed)
Medication Instructions:  No changes *If you need a refill on your cardiac medications before your next appointment, please call your pharmacy*   Lab Work: none If you have labs (blood work) drawn today and your tests are completely normal, you will receive your results only by: MyChart Message (if you have MyChart) OR A paper copy in the mail If you have any lab test that is abnormal or we need to change your treatment, we will call you to review the results.   Testing/Procedures: none   Follow-Up: At [facility], you and your health needs are our priority.  As part of our continuing mission to provide you with exceptional heart care, we have created designated Provider Care Teams.  These Care Teams include your primary Cardiologist (physician) and Advanced Practice Providers (APPs -  Physician Assistants and Nurse Practitioners) who all work together to provide you with the care you need, when you need it.  We recommend signing up for the patient portal called "MyChart".  Sign up information is provided on this After Visit Summary.  MyChart is used to connect with patients for Virtual Visits (Telemedicine).  Patients are able to view lab/test results, encounter notes, upcoming appointments, etc.  Non-urgent messages can be sent to your provider as well.   To learn more about what you can do with MyChart, go to [email or web address].    Your next appointment:   12 month(s)  Provider:   Christopher McAlhany, MD      

## 2022-12-24 DIAGNOSIS — M0579 Rheumatoid arthritis with rheumatoid factor of multiple sites without organ or systems involvement: Secondary | ICD-10-CM | POA: Diagnosis not present

## 2023-01-20 DIAGNOSIS — L570 Actinic keratosis: Secondary | ICD-10-CM | POA: Diagnosis not present

## 2023-01-20 DIAGNOSIS — Z85828 Personal history of other malignant neoplasm of skin: Secondary | ICD-10-CM | POA: Diagnosis not present

## 2023-01-20 DIAGNOSIS — Z85820 Personal history of malignant melanoma of skin: Secondary | ICD-10-CM | POA: Diagnosis not present

## 2023-01-20 DIAGNOSIS — C44311 Basal cell carcinoma of skin of nose: Secondary | ICD-10-CM | POA: Diagnosis not present

## 2023-01-20 DIAGNOSIS — L821 Other seborrheic keratosis: Secondary | ICD-10-CM | POA: Diagnosis not present

## 2023-01-20 DIAGNOSIS — D692 Other nonthrombocytopenic purpura: Secondary | ICD-10-CM | POA: Diagnosis not present

## 2023-01-20 DIAGNOSIS — L720 Epidermal cyst: Secondary | ICD-10-CM | POA: Diagnosis not present

## 2023-01-20 DIAGNOSIS — D2261 Melanocytic nevi of right upper limb, including shoulder: Secondary | ICD-10-CM | POA: Diagnosis not present

## 2023-01-20 DIAGNOSIS — D1801 Hemangioma of skin and subcutaneous tissue: Secondary | ICD-10-CM | POA: Diagnosis not present

## 2023-02-03 DIAGNOSIS — M0579 Rheumatoid arthritis with rheumatoid factor of multiple sites without organ or systems involvement: Secondary | ICD-10-CM | POA: Diagnosis not present

## 2023-02-03 DIAGNOSIS — Z79899 Other long term (current) drug therapy: Secondary | ICD-10-CM | POA: Diagnosis not present

## 2023-02-18 DIAGNOSIS — M0579 Rheumatoid arthritis with rheumatoid factor of multiple sites without organ or systems involvement: Secondary | ICD-10-CM | POA: Diagnosis not present

## 2023-03-03 DIAGNOSIS — M0579 Rheumatoid arthritis with rheumatoid factor of multiple sites without organ or systems involvement: Secondary | ICD-10-CM | POA: Diagnosis not present

## 2023-03-03 DIAGNOSIS — Z79899 Other long term (current) drug therapy: Secondary | ICD-10-CM | POA: Diagnosis not present

## 2023-03-03 DIAGNOSIS — D84821 Immunodeficiency due to drugs: Secondary | ICD-10-CM | POA: Diagnosis not present

## 2023-03-10 DIAGNOSIS — Z23 Encounter for immunization: Secondary | ICD-10-CM | POA: Diagnosis not present

## 2023-04-09 DIAGNOSIS — I700 Atherosclerosis of aorta: Secondary | ICD-10-CM | POA: Diagnosis not present

## 2023-04-09 DIAGNOSIS — K219 Gastro-esophageal reflux disease without esophagitis: Secondary | ICD-10-CM | POA: Diagnosis not present

## 2023-04-09 DIAGNOSIS — M069 Rheumatoid arthritis, unspecified: Secondary | ICD-10-CM | POA: Diagnosis not present

## 2023-04-09 DIAGNOSIS — E7800 Pure hypercholesterolemia, unspecified: Secondary | ICD-10-CM | POA: Diagnosis not present

## 2023-04-09 DIAGNOSIS — H9190 Unspecified hearing loss, unspecified ear: Secondary | ICD-10-CM | POA: Diagnosis not present

## 2023-04-09 DIAGNOSIS — I10 Essential (primary) hypertension: Secondary | ICD-10-CM | POA: Diagnosis not present

## 2023-04-14 DIAGNOSIS — C61 Malignant neoplasm of prostate: Secondary | ICD-10-CM | POA: Diagnosis not present

## 2023-04-15 DIAGNOSIS — M0579 Rheumatoid arthritis with rheumatoid factor of multiple sites without organ or systems involvement: Secondary | ICD-10-CM | POA: Diagnosis not present

## 2023-06-26 DIAGNOSIS — Z79899 Other long term (current) drug therapy: Secondary | ICD-10-CM | POA: Diagnosis not present

## 2023-06-26 DIAGNOSIS — M0579 Rheumatoid arthritis with rheumatoid factor of multiple sites without organ or systems involvement: Secondary | ICD-10-CM | POA: Diagnosis not present

## 2023-07-17 DIAGNOSIS — M0579 Rheumatoid arthritis with rheumatoid factor of multiple sites without organ or systems involvement: Secondary | ICD-10-CM | POA: Diagnosis not present

## 2023-08-07 DIAGNOSIS — L821 Other seborrheic keratosis: Secondary | ICD-10-CM | POA: Diagnosis not present

## 2023-08-07 DIAGNOSIS — L723 Sebaceous cyst: Secondary | ICD-10-CM | POA: Diagnosis not present

## 2023-08-07 DIAGNOSIS — Z85820 Personal history of malignant melanoma of skin: Secondary | ICD-10-CM | POA: Diagnosis not present

## 2023-08-07 DIAGNOSIS — D692 Other nonthrombocytopenic purpura: Secondary | ICD-10-CM | POA: Diagnosis not present

## 2023-08-07 DIAGNOSIS — D1801 Hemangioma of skin and subcutaneous tissue: Secondary | ICD-10-CM | POA: Diagnosis not present

## 2023-08-07 DIAGNOSIS — Z85828 Personal history of other malignant neoplasm of skin: Secondary | ICD-10-CM | POA: Diagnosis not present

## 2023-08-07 DIAGNOSIS — L720 Epidermal cyst: Secondary | ICD-10-CM | POA: Diagnosis not present

## 2023-08-07 DIAGNOSIS — L570 Actinic keratosis: Secondary | ICD-10-CM | POA: Diagnosis not present

## 2023-08-11 DIAGNOSIS — Z961 Presence of intraocular lens: Secondary | ICD-10-CM | POA: Diagnosis not present

## 2023-08-11 DIAGNOSIS — H35373 Puckering of macula, bilateral: Secondary | ICD-10-CM | POA: Diagnosis not present

## 2023-08-11 DIAGNOSIS — H52203 Unspecified astigmatism, bilateral: Secondary | ICD-10-CM | POA: Diagnosis not present

## 2023-08-21 DIAGNOSIS — M0579 Rheumatoid arthritis with rheumatoid factor of multiple sites without organ or systems involvement: Secondary | ICD-10-CM | POA: Diagnosis not present

## 2023-08-26 ENCOUNTER — Encounter: Payer: Self-pay | Admitting: Internal Medicine

## 2023-08-26 ENCOUNTER — Other Ambulatory Visit: Payer: Self-pay

## 2023-08-26 ENCOUNTER — Ambulatory Visit: Payer: Medicare Other | Admitting: Internal Medicine

## 2023-08-26 VITALS — BP 110/65 | HR 61 | Temp 97.7°F | Wt 147.0 lb

## 2023-08-26 DIAGNOSIS — M4644 Discitis, unspecified, thoracic region: Secondary | ICD-10-CM | POA: Diagnosis not present

## 2023-08-26 MED ORDER — DOXYCYCLINE HYCLATE 100 MG PO TABS: 100.0000 mg | ORAL_TABLET | Freq: Two times a day (BID) | ORAL | 11 refills | Status: AC

## 2023-08-26 NOTE — Progress Notes (Signed)
   Subjective:    Patient ID: Tanner Rice, male    DOB: 17-Jan-1934, 89 y.o.   MRN: [id number]  HPI Tanner Rice is here for follow-up of thoracic discitis. He has a history of discitis in 2017 and treated with prolonged IV antibiotics but unfortunately relapsed so has been on chronic suppressive doxycycline since that time.  He continues to do well from his thoracic standpoint with no new concerns for infection, no new pain or other concerns.  He is tolerating doxycycline well with no nausea, photosensitivity or other concerns.   Review of Systems  Constitutional:  Negative for chills and fever.  Skin:  Negative for rash.       Objective:   Physical Exam Eyes:     General: No scleral icterus. Pulmonary:     Effort: Pulmonary effort is normal.  Neurological:     Mental Status: He is alert.           Assessment & Plan:

## 2023-08-26 NOTE — Assessment & Plan Note (Signed)
 History reviewed from 2017 and discussed with patient the option of continuing with chronic suppressive doxycycline.  He continues to tolerate this well so no indication to change.  I have provided refills for 12 months and he can continue with yearly follow-up.  Discussed potential side effects and let us know if there are any concerns.  I have personally spent 30 minutes involved in face-to-face and non-face-to-face activities for this patient on the day of the visit. Professional time spent includes the following activities: Preparing to see the patient (review of tests), Obtaining and/or reviewing separately obtained history (admission/discharge record), Performing a medically appropriate examination and/or evaluation , Ordering medications/tests/procedures, referring and communicating with other health care professionals, Documenting clinical information in the EMR, Independently interpreting results (not separately reported), Communicating results to the patient/family/caregiver, Counseling and educating the patient/family/caregiver and Care coordination (not separately reported).

## 2023-10-13 DIAGNOSIS — Z79899 Other long term (current) drug therapy: Secondary | ICD-10-CM | POA: Diagnosis not present

## 2023-10-13 DIAGNOSIS — Z23 Encounter for immunization: Secondary | ICD-10-CM | POA: Diagnosis not present

## 2023-10-13 DIAGNOSIS — I10 Essential (primary) hypertension: Secondary | ICD-10-CM | POA: Diagnosis not present

## 2023-10-13 DIAGNOSIS — M4644 Discitis, unspecified, thoracic region: Secondary | ICD-10-CM | POA: Diagnosis not present

## 2023-10-13 DIAGNOSIS — C61 Malignant neoplasm of prostate: Secondary | ICD-10-CM | POA: Diagnosis not present

## 2023-10-13 DIAGNOSIS — M069 Rheumatoid arthritis, unspecified: Secondary | ICD-10-CM | POA: Diagnosis not present

## 2023-10-13 DIAGNOSIS — K219 Gastro-esophageal reflux disease without esophagitis: Secondary | ICD-10-CM | POA: Diagnosis not present

## 2023-10-13 DIAGNOSIS — Z1331 Encounter for screening for depression: Secondary | ICD-10-CM | POA: Diagnosis not present

## 2023-10-13 DIAGNOSIS — Z0000 Encounter for general adult medical examination without abnormal findings: Secondary | ICD-10-CM | POA: Diagnosis not present

## 2023-10-13 DIAGNOSIS — E7800 Pure hypercholesterolemia, unspecified: Secondary | ICD-10-CM | POA: Diagnosis not present

## 2023-10-13 DIAGNOSIS — E611 Iron deficiency: Secondary | ICD-10-CM | POA: Diagnosis not present

## 2023-10-16 DIAGNOSIS — M0579 Rheumatoid arthritis with rheumatoid factor of multiple sites without organ or systems involvement: Secondary | ICD-10-CM | POA: Diagnosis not present

## 2023-10-20 DIAGNOSIS — N401 Enlarged prostate with lower urinary tract symptoms: Secondary | ICD-10-CM | POA: Diagnosis not present

## 2023-10-20 DIAGNOSIS — R3915 Urgency of urination: Secondary | ICD-10-CM | POA: Diagnosis not present

## 2023-10-20 DIAGNOSIS — C61 Malignant neoplasm of prostate: Secondary | ICD-10-CM | POA: Diagnosis not present

## 2023-11-13 DIAGNOSIS — M0579 Rheumatoid arthritis with rheumatoid factor of multiple sites without organ or systems involvement: Secondary | ICD-10-CM | POA: Diagnosis not present

## 2023-11-13 DIAGNOSIS — Z79899 Other long term (current) drug therapy: Secondary | ICD-10-CM | POA: Diagnosis not present

## 2023-11-20 DIAGNOSIS — Z79899 Other long term (current) drug therapy: Secondary | ICD-10-CM | POA: Diagnosis not present

## 2023-11-20 DIAGNOSIS — M0579 Rheumatoid arthritis with rheumatoid factor of multiple sites without organ or systems involvement: Secondary | ICD-10-CM | POA: Diagnosis not present

## 2023-12-01 DIAGNOSIS — R3915 Urgency of urination: Secondary | ICD-10-CM | POA: Diagnosis not present

## 2023-12-01 DIAGNOSIS — N401 Enlarged prostate with lower urinary tract symptoms: Secondary | ICD-10-CM | POA: Diagnosis not present

## 2023-12-01 DIAGNOSIS — R351 Nocturia: Secondary | ICD-10-CM | POA: Diagnosis not present

## 2023-12-11 DIAGNOSIS — M0579 Rheumatoid arthritis with rheumatoid factor of multiple sites without organ or systems involvement: Secondary | ICD-10-CM | POA: Diagnosis not present

## 2024-02-02 ENCOUNTER — Ambulatory Visit: Attending: Cardiovascular Disease | Admitting: Cardiovascular Disease

## 2024-02-02 VITALS — BP 152/60 | HR 61 | Ht 66.0 in | Wt 144.4 lb

## 2024-02-02 DIAGNOSIS — I350 Nonrheumatic aortic (valve) stenosis: Secondary | ICD-10-CM | POA: Diagnosis not present

## 2024-02-02 DIAGNOSIS — I471 Supraventricular tachycardia, unspecified: Secondary | ICD-10-CM | POA: Insufficient documentation

## 2024-02-02 NOTE — Patient Instructions (Signed)
 Medication Instructions:  No changes *If you need a refill on your cardiac medications before your next appointment, please call your pharmacy*  Lab Work: none If you have labs (blood work) drawn today and your tests are completely normal, you will receive your results only by: MyChart Message (if you have MyChart) OR A paper copy in the mail If you have any lab test that is abnormal or we need to change your treatment, we will call you to review the results.  Testing/Procedures: Your physician has requested that you have an echocardiogram. Echocardiography is a painless test that uses sound waves to create images of your heart. It provides your doctor with information about the size and shape of your heart and how well your heart's chambers and valves are working. This procedure takes approximately one hour. There are no restrictions for this procedure. Please do NOT wear cologne, perfume, aftershave, or lotions (deodorant is allowed). Please arrive 15 minutes prior to your appointment time.  Please note: We ask at that you not bring children with you during ultrasound (echo/ vascular) testing. Due to room size and safety concerns, children are not allowed in the ultrasound rooms during exams. Our front office staff cannot provide observation of children in our lobby area while testing is being conducted. An adult accompanying a patient to their appointment will only be allowed in the ultrasound room at the discretion of the ultrasound technician under special circumstances. We apologize for any inconvenience.   Follow-Up: At [facility], you and your health needs are our priority.  As part of our continuing mission to provide you with exceptional heart care, our providers are all part of one team.  This team includes your primary Cardiologist (physician) and Advanced Practice Providers or APPs (Physician Assistants and Nurse Practitioners) who all work together to provide you with the  care you need, when you need it.  Your next appointment:   12 month(s)  Provider:   Antoinette Batman, MD    We recommend signing up for the patient portal called "MyChart".  Sign up information is provided on this After Visit Summary.  MyChart is used to connect with patients for Virtual Visits (Telemedicine).  Patients are able to view lab/test results, encounter notes, upcoming appointments, etc.  Non-urgent messages can be sent to your provider as well.   To learn more about what you can do with MyChart, go to [email or web address].   Other Instructions

## 2024-02-02 NOTE — Progress Notes (Signed)
 Chief Complaint  Patient presents with   Follow-up    Aortic stenosis, SVT   History of Present Illness 89 yo male with history of aortic stenosis, SVT, HTN, rheumatoid arthritis and melanoma who is here today for follow up. I saw him as a new patient for the evaluation of syncope on 04/26/22. He was admitted to [facility] 03/28/22 after becoming lightheaded at work. He became weak and fell. He does not think he passed out. He was found to have left facial droop. Code Stroke called and he was given emergent TNK. His blood sugar level was low. He was evaluated by the Neurology team while admitted and no evidence of CVA on brain MRI. He was bradycardic during his admission and his beta blocker was held. Echo 03/28/22 with LVEF=60-65%. Trivial MR. Mild aortic stenosis (mean gradient 9 mmHg). Question of whether or not he may have had syncope from bradycardia or heart block. Neck CTA on 03/28/22 with mild to moderate LICA stenosis, no RICA stenosis. Cardiac monitor November 2023 with sinus with lowest heart rate 36 bpm, first degree AV block, bundle branch block, 5 beat run of VT and 18 runs of SVT.   He is here today for follow up. The patient denies any chest pain, dyspnea, palpitations, lower extremity edema, orthopnea, PND, dizziness, near syncope or syncope.   Primary Care Physician: Charlott Dorn LABOR, MD   Past Medical History:  Diagnosis Date   Aortic stenosis    Hypertension    Melanoma (HCC)    upper thigh on right leg   Rheumatoid arthritis(714.0)    Thoracic discitis    Past Surgical History:  Procedure Laterality Date   CATARACT EXTRACTION, BILATERAL     COLONOSCOPY     hole in retina surgery     INGUINAL HERNIA REPAIR Left 02/09/2019   Procedure: LEFT INGUINAL HERNIA REPAIR WITH MESH;  Surgeon: Ebbie Cough, MD;  Location: [facility] OR;  Service: General;  Laterality: Left;  GENERAL AND TAP BLOCK   INGUINAL HERNIA REPAIR Right 02/14/2022   Procedure: RIGHT INGUINAL HERNIA REPAIR;   Surgeon: Ebbie Cough, MD;  Location: [facility] OR;  Service: General;  Laterality: Right;   INSERTION OF MESH Right 02/14/2022   Procedure: INSERTION OF MESH;  Surgeon: Ebbie Cough, MD;  Location: [facility] OR;  Service: General;  Laterality: Right;   IR GENERIC HISTORICAL  04/24/2016   IR US  GUIDE VASC ACCESS RIGHT 04/24/2016 Toribio Faes, MD [facility]   IR GENERIC HISTORICAL  04/24/2016   IR FLUORO GUIDE CV LINE RIGHT 04/24/2016 Toribio Faes, MD [facility]   TEE WITHOUT CARDIOVERSION N/A 02/02/2016   Procedure: TRANSESOPHAGEAL ECHOCARDIOGRAM (TEE);  Surgeon: Oneil JAYSON Parchment, MD;  Location: [facility] ENDOSCOPY;  Service: Cardiovascular;  Laterality: N/A;   TONSILLECTOMY      Current Outpatient Medications  Medication Sig Dispense Refill   acetaminophen  (TYLENOL ) 650 MG CR tablet Take 650 mg by mouth every 8 (eight) hours as needed for pain.     alfuzosin  (UROXATRAL ) 10 MG 24 hr tablet Take 10 mg by mouth every evening.     amLODipine  (NORVASC ) 5 MG tablet Take 5 mg by mouth daily.     aspirin  EC 81 MG tablet Take 1 tablet (81 mg total) by mouth daily. Swallow whole. 30 tablet 12   Calcium  Carb-Cholecalciferol  (CALCIUM  600/VITAMIN D3 PO) Take 1 tablet by mouth daily.     cholecalciferol  (VITAMIN D) 25 MCG (1000 UT) tablet Take 1,000 Units by mouth in the morning and  at bedtime.     Cyanocobalamin  (VITAMIN B12) 1000 MCG TBCR Take 1,000 mcg by mouth in the morning and at bedtime.     diclofenac  Sodium (VOLTAREN ) 1 % GEL Apply 1 Application topically 2 (two) times daily as needed (pain).     doxycycline  (VIBRA -TABS) 100 MG tablet Take 1 tablet (100 mg total) by mouth 2 (two) times daily. 60 tablet 11   finasteride  (PROSCAR ) 5 MG tablet Take 5 mg by mouth daily.     folic acid  (FOLVITE ) 1 MG tablet Take 1 mg by mouth daily.     golimumab  (SIMPONI  ARIA) 50 MG/4ML SOLN injection Inject 50 mg into the vein See admin instructions. Inject 50 mg intravenous Every 6-8 weeks per patient     iron  polysaccharides (FERREX 150) 150 MG capsule Take 150 mg by mouth daily.     lisinopril  (ZESTRIL ) 20 MG tablet Take 20 mg by mouth daily.     Melatonin 5 MG TABS Take 5 mg by mouth at bedtime.     methotrexate  (RHEUMATREX) 2.5 MG tablet Take 10 mg by mouth See admin instructions. Take 10 mg twice daily every Thursday per patient     metoprolol  succinate (TOPROL -XL) 25 MG 24 hr tablet Take 25 mg by mouth daily.     Misc Natural Products (TURMERIC CURCUMIN) CAPS Take 1 capsule by mouth daily.     Multiple Vitamin (MULTIVITAMIN WITH MINERALS) TABS tablet Take 1 tablet by mouth daily.     MYRBETRIQ 50 MG TB24 tablet Take 50 mg by mouth daily.     rosuvastatin  (CRESTOR ) 20 MG tablet Take 1 tablet (20 mg total) by mouth daily. 30 tablet 1   sennosides-docusate sodium  (SENOKOT-S) 8.6-50 MG tablet Take 1 tablet by mouth daily as needed for constipation.     No current facility-administered medications for this visit.    Allergies  Allergen Reactions   Other Diarrhea, Nausea And Vomiting and Other (See Comments)    Seafood - mussels    Penicillins Cross Reactors Hives    Did it involve swelling of the face/tongue/throat, SOB, or low BP? Unknown Did it involve sudden or severe rash/hives, skin peeling, or any reaction on the inside of your mouth or nose? Unknown Did you need to seek medical attention at a hospital or doctor's office? Yes When did it last happen? over 65 years ago     If all above answers are "NO", may proceed with cephalosporin use. .    Social History   Socioeconomic History   Marital status: Married    Spouse name: Not on file   Number of children: 2   Years of education: Not on file   Highest education level: Not on file  Occupational History   Occupation: Retired-management in retail  Tobacco Use   Smoking status: Former    Types: Pipe, Software engineer, Cigarettes   Smokeless tobacco: Never   Tobacco comments:    quit 42 yrs. ago  Vaping Use   Vaping status: Never Used   Substance and Sexual Activity   Alcohol use: Yes    Comment: 5x week glass wine   Drug use: No   Sexual activity: Not on file  Other Topics Concern   Not on file  Social History Narrative   Not on file   Social Drivers of Health   Financial Resource Strain: Not on file  Food Insecurity: No Food Insecurity (03/28/2022)   Hunger Vital Sign    Worried About Running Out of Food in  the Last Year: Never true    Ran Out of Food in the Last Year: Never true  Transportation Needs: No Transportation Needs (03/28/2022)   PRAPARE - Administrator, Civil Service (Medical): No    Lack of Transportation (Non-Medical): No  Physical Activity: Not on file  Stress: Not on file  Social Connections: Unknown (10/08/2021)   Received from [facility]   Social Network    Social Network: Not on file  Intimate Partner Violence: Not At Risk (03/28/2022)   Humiliation, Afraid, Rape, and Kick questionnaire    Fear of Current or Ex-Partner: No    Emotionally Abused: No    Physically Abused: No    Sexually Abused: No    Family History  Problem Relation Age of Onset   Prostate cancer Father    Prostate cancer Brother    Osteoarthritis Brother     Review of Systems:  As stated in the HPI and otherwise negative.   BP (!) 152/60   Pulse 61   Ht 5' 6 (1.676 m)   Wt 144 lb 6.4 oz (65.5 kg)   SpO2 99%   BMI 23.31 kg/m   Physical Examination: General: Well developed, well nourished, NAD  HEENT: OP clear, mucus membranes moist  SKIN: warm, dry. No rashes. Neuro: No focal deficits  Musculoskeletal: Muscle strength 5/5 all ext  Psychiatric: Mood and affect normal  Neck: No JVD, no carotid bruits, no thyromegaly, no lymphadenopathy.  Lungs:Clear bilaterally, no wheezes, rhonci, crackles Cardiovascular: Regular rate and rhythm. Systolic murmur.  Abdomen:Soft. Bowel sounds present. Non-tender.  Extremities: No lower extremity edema. Pulses are 2 + in the bilateral DP/PT.  EKG:  EKG  is ordered today. The ekg ordered today demonstrates  EKG Interpretation Date/Time:  Monday February 02 2024 12:04:44 EDT Ventricular Rate:  60 PR Interval:  294 QRS Duration:  134 QT Interval:  440 QTC Calculation: 440 R Axis:   -39  Text Interpretation: Sinus rhythm with 1st degree A-V block with Premature atrial complexes Right bundle branch block Minimal voltage criteria for LVH, may be normal variant ( Confirmed by Verlin Bruckner [phone]) on 02/02/2024 12:06:34 PM    Recent Labs: No results found for requested labs within last 365 days.   Lipid Panel    Component Value Date/Time   CHOL 183 03/29/2022 0427   TRIG 90 03/29/2022 0427   HDL 62 03/29/2022 0427   CHOLHDL 3.0 03/29/2022 0427   VLDL 18 03/29/2022 0427   LDLCALC 103 (H) 03/29/2022 0427     Wt Readings from Last 3 Encounters:  02/02/24 144 lb 6.4 oz (65.5 kg)  08/26/23 147 lb (66.7 kg)  12/02/22 148 lb (67.1 kg)    Assessment and Plan:   1. Syncope/sinus bradycardia/SVT: Unclear etiology of syncope. He has had no recurrence of syncope or near syncope. Neck CTA without evidence of obstructive CAD or carotid dissection. Echo with normal LV function. Mild AS but this would not cause his syncope. No evidence of a CVA on MRI. ( He did receive TNK in the ED). Cardiac monitor with nocturnal sinus bradycardia with no high grade AV block. There was SVT but this is not felt to likely be a cause for his syncope.  Continue Toprol .   2. Aortic stenosis: Mild by echo in 2023. Repeat echo now.   3. SVT: No awareness of palpitations. Continue Toprol .   Labs/ tests ordered today include:   Orders Placed This Encounter  Procedures   EKG 12-Lead  ECHOCARDIOGRAM COMPLETE   Disposition:   F/U with me in 12 months.    Signed, Lonni Cash, MD, [facility] 02/02/2024 1:40 PM    [facility] [address] [place], [place], [place]  [address] Phone: [phone]; Fax: [phone]

## 2024-02-03 ENCOUNTER — Ambulatory Visit (HOSPITAL_COMMUNITY)
Admission: RE | Admit: 2024-02-03 | Discharge: 2024-02-03 | Disposition: A | Discharge status: Home / Self Care | Source: Ambulatory Visit | Attending: Cardiovascular Disease | Admitting: Cardiovascular Disease

## 2024-02-03 DIAGNOSIS — I350 Nonrheumatic aortic (valve) stenosis: Secondary | ICD-10-CM | POA: Insufficient documentation

## 2024-02-03 LAB — ECHOCARDIOGRAM COMPLETE
AR max vel: 1.27 cm2
AV Area VTI: 1.31 cm2
AV Area mean vel: 1.29 cm2
AV Mean grad: 9 mmHg
AV Peak grad: 17 mmHg
Ao pk vel: 2.06 m/s
Area-P 1/2: 1.72 cm2
S' Lateral: 2.6 cm

## 2024-02-04 ENCOUNTER — Ambulatory Visit: Payer: Self-pay | Admitting: Cardiovascular Disease

## 2024-02-05 DIAGNOSIS — M0579 Rheumatoid arthritis with rheumatoid factor of multiple sites without organ or systems involvement: Secondary | ICD-10-CM | POA: Diagnosis not present

## 2024-02-05 DIAGNOSIS — Z85828 Personal history of other malignant neoplasm of skin: Secondary | ICD-10-CM | POA: Diagnosis not present

## 2024-02-05 DIAGNOSIS — L570 Actinic keratosis: Secondary | ICD-10-CM | POA: Diagnosis not present

## 2024-02-05 DIAGNOSIS — Z85820 Personal history of malignant melanoma of skin: Secondary | ICD-10-CM | POA: Diagnosis not present

## 2024-03-08 ENCOUNTER — Ambulatory Visit: Admitting: Cardiovascular Disease

## 2024-04-01 DIAGNOSIS — M47812 Spondylosis without myelopathy or radiculopathy, cervical region: Secondary | ICD-10-CM | POA: Diagnosis not present

## 2024-04-01 DIAGNOSIS — M4802 Spinal stenosis, cervical region: Secondary | ICD-10-CM | POA: Diagnosis not present

## 2024-04-01 DIAGNOSIS — G8929 Other chronic pain: Secondary | ICD-10-CM | POA: Diagnosis not present

## 2024-04-01 DIAGNOSIS — M0579 Rheumatoid arthritis with rheumatoid factor of multiple sites without organ or systems involvement: Secondary | ICD-10-CM | POA: Diagnosis not present

## 2024-04-01 DIAGNOSIS — D649 Anemia, unspecified: Secondary | ICD-10-CM | POA: Diagnosis not present

## 2024-04-01 DIAGNOSIS — M542 Cervicalgia: Secondary | ICD-10-CM | POA: Diagnosis not present

## 2024-04-01 DIAGNOSIS — Z79899 Other long term (current) drug therapy: Secondary | ICD-10-CM | POA: Diagnosis not present

## 2024-04-08 DIAGNOSIS — M0579 Rheumatoid arthritis with rheumatoid factor of multiple sites without organ or systems involvement: Secondary | ICD-10-CM | POA: Diagnosis not present

## 2024-04-14 DIAGNOSIS — M069 Rheumatoid arthritis, unspecified: Secondary | ICD-10-CM | POA: Diagnosis not present

## 2024-04-14 DIAGNOSIS — I10 Essential (primary) hypertension: Secondary | ICD-10-CM | POA: Diagnosis not present

## 2024-04-14 DIAGNOSIS — M542 Cervicalgia: Secondary | ICD-10-CM | POA: Diagnosis not present

## 2024-04-14 DIAGNOSIS — Z8739 Personal history of other diseases of the musculoskeletal system and connective tissue: Secondary | ICD-10-CM | POA: Diagnosis not present

## 2024-04-26 DIAGNOSIS — C61 Malignant neoplasm of prostate: Secondary | ICD-10-CM | POA: Diagnosis not present

## 2024-05-17 ENCOUNTER — Other Ambulatory Visit: Payer: Self-pay

## 2024-05-17 ENCOUNTER — Encounter: Payer: Self-pay | Admitting: Orthopaedic Surgery

## 2024-05-17 ENCOUNTER — Ambulatory Visit: Admitting: Orthopaedic Surgery

## 2024-05-17 DIAGNOSIS — M542 Cervicalgia: Secondary | ICD-10-CM

## 2024-05-17 MED ORDER — METHYLPREDNISOLONE 4 MG PO TABS: ORAL_TABLET | ORAL | 0 refills | Status: AC

## 2024-05-17 MED ORDER — TIZANIDINE HCL 2 MG PO TABS: 2.0000 mg | ORAL_TABLET | Freq: Three times a day (TID) | ORAL | 1 refills | Status: AC | PRN

## 2024-05-17 NOTE — Progress Notes (Signed)
 The patient is a 89 year old gentleman who comes in with 6 months of neck pain and stiffness with no known injury.  He does report some left shoulder discomfort with no known injury.  He denies any weakness in his hands and denies any numbness and tingling in his arms and mainly reports his pain in his neck.  He has never had surgery on his neck either.  On exam he does have stiffness with lateral rotation, bending as well as flexion and extension of the cervical spine.  He does show some deficits of the rotator cuff on the left shoulder and some pain that is subacromial in the left shoulder.  He can reach above his head.  Both hands have decent grip strength.  2 views of the cervical spine show severe and profound degenerative changes throughout the entire cervical spine with osteophytes, near fusion of multiple levels with degenerative disc disease.  I showed him his x-ray findings and have recommended outpatient physical therapy to hopefully decrease the stiffness of his neck.  He would like to try medical treatment first so we will send in a very low-dose of tizanidine  as well as a steroid Dosepak since he is not a diabetic.  He has our card.  If he decides to go to physical therapy route he will call us  so we could set him up for outpatient physical therapy at [facility] with any modalities.

## 2024-05-18 DIAGNOSIS — D649 Anemia, unspecified: Secondary | ICD-10-CM | POA: Diagnosis not present

## 2024-05-19 DIAGNOSIS — L570 Actinic keratosis: Secondary | ICD-10-CM | POA: Diagnosis not present

## 2024-05-19 DIAGNOSIS — Z85828 Personal history of other malignant neoplasm of skin: Secondary | ICD-10-CM | POA: Diagnosis not present

## 2024-05-19 DIAGNOSIS — L821 Other seborrheic keratosis: Secondary | ICD-10-CM | POA: Diagnosis not present

## 2024-05-19 DIAGNOSIS — D485 Neoplasm of uncertain behavior of skin: Secondary | ICD-10-CM | POA: Diagnosis not present

## 2024-05-19 DIAGNOSIS — Z85820 Personal history of malignant melanoma of skin: Secondary | ICD-10-CM | POA: Diagnosis not present

## 2024-05-20 DIAGNOSIS — D649 Anemia, unspecified: Secondary | ICD-10-CM | POA: Diagnosis not present

## 2024-08-25 ENCOUNTER — Ambulatory Visit: Admitting: Infectious Disease

## 2024-08-25 ENCOUNTER — Ambulatory Visit: Admitting: Internal Medicine

## 2024-09-01 ENCOUNTER — Ambulatory Visit: Admitting: Infectious Disease
